# Patient Record
Sex: Female | Born: 1940
Health system: Southern US, Community
[De-identification: ages and names within clinical notes are randomized; demographics above are authoritative.]

## PROBLEM LIST (undated history)

## (undated) DIAGNOSIS — J449 Chronic obstructive pulmonary disease, unspecified: Secondary | ICD-10-CM

## (undated) DIAGNOSIS — Z860100 Personal history of colon polyps, unspecified: Secondary | ICD-10-CM

## (undated) DIAGNOSIS — I341 Nonrheumatic mitral (valve) prolapse: Secondary | ICD-10-CM

## (undated) DIAGNOSIS — E079 Disorder of thyroid, unspecified: Secondary | ICD-10-CM

## (undated) DIAGNOSIS — F419 Anxiety disorder, unspecified: Secondary | ICD-10-CM

## (undated) DIAGNOSIS — R011 Cardiac murmur, unspecified: Secondary | ICD-10-CM

## (undated) DIAGNOSIS — IMO0001 Reserved for inherently not codable concepts without codable children: Secondary | ICD-10-CM

## (undated) DIAGNOSIS — I219 Acute myocardial infarction, unspecified: Secondary | ICD-10-CM

## (undated) DIAGNOSIS — I872 Venous insufficiency (chronic) (peripheral): Secondary | ICD-10-CM

## (undated) DIAGNOSIS — I10 Essential (primary) hypertension: Secondary | ICD-10-CM

## (undated) DIAGNOSIS — I214 Non-ST elevation (NSTEMI) myocardial infarction: Secondary | ICD-10-CM

## (undated) DIAGNOSIS — J189 Pneumonia, unspecified organism: Secondary | ICD-10-CM

## (undated) DIAGNOSIS — M1990 Unspecified osteoarthritis, unspecified site: Secondary | ICD-10-CM

## (undated) DIAGNOSIS — E785 Hyperlipidemia, unspecified: Secondary | ICD-10-CM

## (undated) DIAGNOSIS — Z5189 Encounter for other specified aftercare: Secondary | ICD-10-CM

## (undated) DIAGNOSIS — M4800 Spinal stenosis, site unspecified: Secondary | ICD-10-CM

## (undated) HISTORY — DX: Hyperlipidemia, unspecified: E78.5

## (undated) HISTORY — DX: Anxiety disorder, unspecified: F41.9

## (undated) HISTORY — DX: Essential (primary) hypertension: I10

## (undated) HISTORY — DX: Chronic obstructive pulmonary disease, unspecified: J44.9

## (undated) HISTORY — DX: Venous insufficiency (chronic) (peripheral): I87.2

## (undated) HISTORY — DX: Nonrheumatic mitral (valve) prolapse: I34.1

## (undated) HISTORY — DX: Unspecified osteoarthritis, unspecified site: M19.90

## (undated) HISTORY — DX: Personal history of colon polyps, unspecified: Z86.0100

## (undated) HISTORY — DX: Encounter for other specified aftercare: Z51.89

## (undated) HISTORY — DX: Cardiac murmur, unspecified: R01.1

## (undated) HISTORY — DX: Spinal stenosis, site unspecified: M48.00

## (undated) HISTORY — DX: Disorder of thyroid, unspecified: E07.9

## (undated) HISTORY — DX: Reserved for inherently not codable concepts without codable children: IMO0001

## (undated) HISTORY — DX: Non-ST elevation (NSTEMI) myocardial infarction: I21.4

## (undated) HISTORY — DX: Personal history of colonic polyps: Z86.010

## (undated) HISTORY — DX: Pneumonia, unspecified organism: J18.9

## (undated) HISTORY — PX: COLONOSCOPY: SHX174

## (undated) HISTORY — PX: SQUAMOUS CELL CARCINOMA EXCISION: SHX2433

---
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
## 1974-07-08 HISTORY — PX: TUBAL LIGATION: SHX77

## 1976-09-07 HISTORY — PX: DILATION AND CURETTAGE OF UTERUS: SHX78

## 1976-11-26 HISTORY — PX: ABDOMINAL HYSTERECTOMY: SHX81

## 1978-02-14 HISTORY — PX: OOPHORECTOMY: SHX86

## 1978-02-14 HISTORY — PX: APPENDECTOMY: SHX54

## 1990-01-26 HISTORY — PX: BASAL CELL CARCINOMA EXCISION: SHX1214

## 1998-09-03 ENCOUNTER — Other Ambulatory Visit: Admission: RE | Admit: 1998-09-03 | Discharge: 1998-09-03 | Payer: Self-pay | Admitting: Obstetrics and Gynecology

## 1998-10-08 ENCOUNTER — Ambulatory Visit (HOSPITAL_COMMUNITY): Admission: RE | Admit: 1998-10-08 | Discharge: 1998-10-08 | Payer: Self-pay | Admitting: Obstetrics & Gynecology

## 1999-10-01 ENCOUNTER — Other Ambulatory Visit: Admission: RE | Admit: 1999-10-01 | Discharge: 1999-10-01 | Payer: Self-pay | Admitting: Obstetrics and Gynecology

## 2000-10-06 ENCOUNTER — Other Ambulatory Visit: Admission: RE | Admit: 2000-10-06 | Discharge: 2000-10-06 | Payer: Self-pay | Admitting: Obstetrics and Gynecology

## 2001-10-26 ENCOUNTER — Other Ambulatory Visit: Admission: RE | Admit: 2001-10-26 | Discharge: 2001-10-26 | Payer: Self-pay | Admitting: Obstetrics and Gynecology

## 2001-11-02 ENCOUNTER — Ambulatory Visit (HOSPITAL_COMMUNITY): Admission: RE | Admit: 2001-11-02 | Discharge: 2001-11-02 | Payer: Self-pay | Admitting: Gastroenterology

## 2002-03-13 ENCOUNTER — Emergency Department (HOSPITAL_COMMUNITY): Admission: EM | Admit: 2002-03-13 | Discharge: 2002-03-13 | Payer: Self-pay | Admitting: Emergency Medicine

## 2002-10-28 ENCOUNTER — Other Ambulatory Visit: Admission: RE | Admit: 2002-10-28 | Discharge: 2002-10-28 | Payer: Self-pay | Admitting: Obstetrics and Gynecology

## 2003-11-14 ENCOUNTER — Other Ambulatory Visit: Admission: RE | Admit: 2003-11-14 | Discharge: 2003-11-14 | Payer: Self-pay | Admitting: Obstetrics and Gynecology

## 2004-08-16 ENCOUNTER — Encounter: Admission: RE | Admit: 2004-08-16 | Discharge: 2004-08-16 | Payer: Self-pay | Admitting: Internal Medicine

## 2004-11-07 ENCOUNTER — Encounter: Admission: RE | Admit: 2004-11-07 | Discharge: 2004-11-07 | Payer: Self-pay | Admitting: Internal Medicine

## 2005-01-01 ENCOUNTER — Other Ambulatory Visit: Admission: RE | Admit: 2005-01-01 | Discharge: 2005-01-01 | Payer: Self-pay | Admitting: Obstetrics and Gynecology

## 2005-03-17 ENCOUNTER — Encounter: Admission: RE | Admit: 2005-03-17 | Discharge: 2005-03-17 | Payer: Self-pay | Admitting: Neurosurgery

## 2005-05-23 ENCOUNTER — Encounter
Admission: RE | Admit: 2005-05-23 | Discharge: 2005-05-23 | Payer: Self-pay | Admitting: Physical Medicine and Rehabilitation

## 2006-05-21 ENCOUNTER — Encounter: Admission: RE | Admit: 2006-05-21 | Discharge: 2006-05-21 | Payer: Self-pay | Admitting: Internal Medicine

## 2006-05-29 ENCOUNTER — Encounter: Admission: RE | Admit: 2006-05-29 | Discharge: 2006-05-29 | Payer: Self-pay | Admitting: Internal Medicine

## 2007-05-18 ENCOUNTER — Ambulatory Visit: Payer: Self-pay | Admitting: Cardiovascular Disease

## 2007-05-19 ENCOUNTER — Inpatient Hospital Stay (HOSPITAL_COMMUNITY): Admission: EM | Admit: 2007-05-19 | Discharge: 2007-05-19 | Payer: Self-pay | Admitting: Emergency Medicine

## 2007-05-28 ENCOUNTER — Encounter: Payer: Self-pay | Admitting: Internal Medicine

## 2007-05-28 ENCOUNTER — Ambulatory Visit: Payer: Self-pay

## 2007-06-03 ENCOUNTER — Encounter: Admission: RE | Admit: 2007-06-03 | Discharge: 2007-06-03 | Payer: Self-pay | Admitting: Internal Medicine

## 2007-06-04 ENCOUNTER — Ambulatory Visit: Payer: Self-pay | Admitting: Cardiology

## 2007-06-16 ENCOUNTER — Ambulatory Visit: Payer: Self-pay | Admitting: Cardiovascular Disease

## 2007-06-16 ENCOUNTER — Ambulatory Visit: Payer: Self-pay

## 2007-07-12 ENCOUNTER — Ambulatory Visit: Payer: Self-pay | Admitting: Cardiology

## 2007-10-18 HISTORY — PX: SHOULDER SURGERY: SHX246

## 2008-06-13 ENCOUNTER — Emergency Department (HOSPITAL_COMMUNITY): Admission: EM | Admit: 2008-06-13 | Discharge: 2008-06-13 | Payer: Self-pay | Admitting: Emergency Medicine

## 2008-06-17 ENCOUNTER — Encounter: Admission: RE | Admit: 2008-06-17 | Discharge: 2008-06-17 | Payer: Self-pay | Admitting: Neurosurgery

## 2008-07-04 ENCOUNTER — Encounter: Admission: RE | Admit: 2008-07-04 | Discharge: 2008-07-04 | Payer: Self-pay | Admitting: Neurosurgery

## 2008-07-18 ENCOUNTER — Encounter: Admission: RE | Admit: 2008-07-18 | Discharge: 2008-07-18 | Payer: Self-pay | Admitting: Neurosurgery

## 2008-08-03 ENCOUNTER — Encounter: Admission: RE | Admit: 2008-08-03 | Discharge: 2008-08-03 | Payer: Self-pay | Admitting: Neurosurgery

## 2008-12-04 DIAGNOSIS — M1990 Unspecified osteoarthritis, unspecified site: Secondary | ICD-10-CM | POA: Insufficient documentation

## 2008-12-04 DIAGNOSIS — I10 Essential (primary) hypertension: Secondary | ICD-10-CM

## 2008-12-04 DIAGNOSIS — E785 Hyperlipidemia, unspecified: Secondary | ICD-10-CM | POA: Insufficient documentation

## 2008-12-04 DIAGNOSIS — N182 Chronic kidney disease, stage 2 (mild): Secondary | ICD-10-CM | POA: Insufficient documentation

## 2008-12-04 DIAGNOSIS — E039 Hypothyroidism, unspecified: Secondary | ICD-10-CM | POA: Diagnosis present

## 2008-12-04 DIAGNOSIS — E1121 Type 2 diabetes mellitus with diabetic nephropathy: Secondary | ICD-10-CM | POA: Insufficient documentation

## 2008-12-05 DIAGNOSIS — I872 Venous insufficiency (chronic) (peripheral): Secondary | ICD-10-CM | POA: Insufficient documentation

## 2010-07-23 NOTE — Procedures (Signed)
[facility]                              EXERCISE TREADMILL   NAME:Desiree Leon, Delgadillo                       MRN:          [id number]  DATE:06/16/2007                            DOB:          1940/08/06    Ms. Alverson is a 70 year old white female that is having this stress test  to rule out microvascular angina.  She has had a normal cardiac  catheterization and 2-D echo.   Baseline EKG, normal sinus rhythm.  The patient exercised to stage 2 of  the Bruce protocol.  We did have to hold her in stage 2.  She could not  progress 3, but she ended up walking 6 minutes, 52 seconds.  Test was  stopped due to shortness of breath and fatigue.  She had a baseline  blood pressure of 129/67, went up as high as 178/52, came down to  132/58.  She did achieve 73% of her maximum predicted heart rate.  She  had no ST T-wave changes.  This was a negative exercise tolerance test.  She had poor exercise tolerance.      Jacolyn Reedy, PA-C  Electronically Signed      Noralyn Pick. Eden Emms, MD, [facility]  Electronically Signed   ML/MedQ  DD: 06/16/2007  DT: 06/16/2007  Job #: [id number]

## 2010-07-23 NOTE — Assessment & Plan Note (Signed)
[facility]                            CARDIOLOGY OFFICE NOTE   Desiree Leon, Desiree Leon                       MRN:          [id number]  DATE:07/12/2007                            DOB:          04/13/1940    PRIMARY CARE PHYSICIAN:  Desiree Leon, M.D.   CLINICAL HISTORY:  [name] is 70 years old and returns for followup  management of her chest pain.  She has a history of remote mitral valve  prolapse and was recently hospitalized with chest pain and underwent  catheterization.  She was found to have mild nonobstructive coronary  disease.  She had persistent symptoms of chest pain, and we arranged for  have to have a stress ECG to see if she might have diagnostic ECG  changes consistent with a diagnosis of microvascular angina.  She did  not have any chest pain or ECG changes on her treadmill test.  She says  that she has done somewhat better over the last few weeks.   She is also evaluated by Dr. Jacky Leon for pain under her left breast with  ultrasound of her liver, gallbladder, and pancreas; and according to her  account, these were all negative.  She also had a CT obtained of the  anterior of the chest by Dr. Jacky Leon, which she indicated was normal.   PAST MEDICAL HISTORY SIGNIFICANT FOR:  1. Hyperlipidemia.  2. Spinal stenosis.  3. Hypertension.  4. Borderline diabetes.  5. Remote tobacco use.   CURRENT MEDICATIONS INCLUDE:  1. Synthroid.  2. Lopressor 50 mg one tablet b.i.d.  3. Nicardipine 20 mg b.i.d.  4. Vytorin.  5. Premarin.  6. Aspirin.   PHYSICAL EXAMINATION:  On examination, today, the blood pressure is  141/72 and the pulse is 63 and regular.  There was no venous distention.  The carotid pulses were full without  bruits.  CHEST:  Clear.  CARDIAC:  Rhythm was regular.  I could hear no murmurs or gallops.  I  could hear no click.  ABDOMEN:  Soft with normal bowel sounds.  There is no  hepatosplenomegaly.  Peripheral pulses  were full with no peripheral edema.   IMPRESSION:  1. Chest pain of uncertain etiology, now improved.  2. Minimal nonobstructive disease at cardiac catheterization.  3. History of mitral valve prolapse, but no prolapse documented on      recent echocardiogram.  4. Hypertension.  5. Hyperlipidemia.   RECOMMENDATIONS:  Fortunately [name] is feeling better.  She feels  that her previous medications, Lopressor and nicardipine help with the  chest pain syndrome that she has had over a number of years.  She says  when she stops the medicine her symptoms increase.  She also indicates  that nitroglycerin helps her pain.  I explained to her that we were not  able to identify any specific cause for her pain, and I did not find any  major problem with her heart disease.  Specifically she has no evidence  of mitral valve prolapse, and has minimal nonobstructive disease at  catheterization, and no evidence for microvascular angina.  She does  have a history of hypertension, and there is an indication for treatment  with both the Lopressor and nicardipine; and if nitroglycerin helps, I  think, it is okay for her to continue that.  I explained that  nitroglycerin sometimes can release pains that are noncardiac.  I will  plan to turn her care back over to Dr. Jacky Leon.  If her situation should  and condition change, I will be most happy to see her back again in  followup.     Desiree Elvera Lennox Juanda Chance, MD, [facility]  Electronically Signed    BRB/MedQ  DD: 07/12/2007  DT: 07/12/2007  Job #: [id number]

## 2010-07-23 NOTE — Assessment & Plan Note (Signed)
[facility]                            CARDIOLOGY OFFICE NOTE   Desiree Leon, Desiree Leon                       MRN:          [id number]  DATE:06/04/2007                            DOB:          06-05-1940    PRIMARY CARE PHYSICIAN:  Geoffry Paradise, M.D.   CLINICAL HISTORY:  Desiree Leon is 70 years old and returns for follow-up  evaluation after a recent hospitalization for chest pain.  I had seen  her sometime many years ago and she had a diagnosis of mitral valve  prolapse associated with chest pain.  Recently, she was admitted to the  hospital with more severe chest pain.  Plans were for an outpatient  evaluation but she had recurrent pain so she underwent catheterization  by Dr. Gala Romney and was found to have minimal nonobstructive coronary  disease.  She was discharged home but has had recurrent symptoms, both  left-sided chest pain which is sharp and mid to left-sided chest pain  which is dull, both at rest and with exertion, and exertion-related  shortness of breath.  She has been seen by Dr. Jacky Kindle who arranged for  her to have a chest CT angiogram which according to her account showed  no evidence of pulmonary embolism.  He has also arranged for her to have  ultrasound of her gallbladder and pancreas, which is pending.   Her past medical history is significant for:  1. Mitral valve prolapse.  2. Hyperlipidemia.  3. Spinal stenosis.  4. Hypertension.  5. Borderline diabetes.  6. Remote tobacco use.   Her current medications include:  1. Synthroid.  2. Lopressor 50 mg one-half tablet b.i.d.  3. Nicardipine 20 mg b.i.d.  4. Vytorin 10/20 daily.  5. Premarin.  6. Pantoprazole.  7. Aspirin.   EXAMINATION:  The blood pressure is 134/82 and the pulse 59 and regular.  There was no venous distension.  The carotid pulses were full without  bruits.  CHEST was clear.  Cardiac rhythm was regular.  There were no  murmurs or gallops or clicks.  The  abdomen was soft without  organomegaly.  Peripheral pulses were full and there was no peripheral  edema.   We did an echocardiogram after her discharge from the hospital and this  showed good LV function and there was no evidence of mitral valve  prolapse.  Her septal wall thickness and posterior wall thickness were  borderline at 12 mm and 11 mm.   IMPRESSION:  1. Chest pain and shortness of breath of uncertain etiology.  2. Minimal nonobstructive coronary artery disease at catheterization.  3. History of mitral valve prolapse but no prolapse documented on      recent echocardiogram.  4. Hypertension.  5. Hyperlipidemia.   RECOMMENDATIONS:  I am uncertain regarding the cause of Desiree Leon's  chest pain.  She does have an exertional component and it is possible  she could have microvascular angina.  I think it is hard to attribute  her symptoms to mitral valve prolapse with an echocardiogram that shows  no structural abnormality of the mitral valve and  no evidence of  prolapse.  Will plan to do a stress ECG (without a scan) to see if she  has exercise-induced ST changes which would point more to the diagnosis  of microvascular angina.  I think it is more likely her symptoms are  noncardiac.  If her exercise test is negative, then we will attribute  the pain to noncardiac causes and let Dr. Jacky Kindle follow through with  his current evaluation.     Bruce Elvera Lennox Juanda Chance, MD, [facility]  Electronically Signed    BRB/MedQ  DD: 06/04/2007  DT: 06/05/2007  Job #: [id number]   cc:   Geoffry Paradise, M.D.

## 2010-07-23 NOTE — Discharge Summary (Signed)
NAME:  JONATHAN, KIRKENDOLL                ACCOUNT NO.:  [id number]   MEDICAL RECORD NO.:  [id number]          PATIENT TYPE:  INP   LOCATION:  6533                         FACILITY:  [facility]   PHYSICIAN:  Bevelyn Buckles. Bensimhon, MDDATE OF BIRTH:  08-22-1940   DATE OF ADMISSION:  05/18/2007  DATE OF DISCHARGE:  05/19/2007                               DISCHARGE SUMMARY   This is an addendum to a previously dictated discharge summary,  job  [id number]. Please refer to that dictation for complete details of  admission.   Ms. Tien was initially set up to be discharged early this morning with  plans for outpatient Myoview and echocardiogram.  Unfortunately Ms.  Revuelta had some recurrent chest discomfort and we decided to perform  left heart cardiac catheterization while she remained an inpatient.  Left heart rate catheterization performed this afternoon showed minor  irregularities in the LAD and right coronary artery.  However, there  were no obstructive lesions and EF was 70% without wall motion  abnormalities, mitral valve prolapse, or mitral regurgitation.  We will  plan to discharge Ms. Hird this evening and I have provided with a  prescription for Protonix 40 mg daily #130 with recent three refills.  Her discharge medications will otherwise remain as they were previously  dictated.      Nicolasa Ducking, ANP      Bevelyn Buckles. Bensimhon, MD  Electronically Signed    CB/MEDQ  D:  05/19/2007  T:  05/21/2007  Job:  [id number]   cc:   Geoffry Paradise, M.D.  James L. Malon Kindle., M.D.  Callie Fielding, M.D.

## 2010-07-23 NOTE — Cardiovascular Report (Signed)
NAME:  Desiree Leon, Desiree Leon                ACCOUNT NO.:  [id number]   MEDICAL RECORD NO.:  [id number]          PATIENT TYPE:  OBV   LOCATION:  6533                         FACILITY:  [facility]   PHYSICIAN:  Bevelyn Buckles. Bensimhon, MDDATE OF BIRTH:  [birth year]   DATE OF PROCEDURE:  05/19/2007  DATE OF DISCHARGE:                            CARDIAC CATHETERIZATION   PRIMARY CARE PHYSICIAN:  Geoffry Paradise, M.D.   CARDIOLOGIST:  Dr. Juanda Chance.   PATIENT IDENTIFICATION:  This was and a 66-year with a history of  hypertension and borderline diabetes.  She also has a history of  reported mitral valve prolapse.  She is admitted with chest pain.  She  had multiple cardiac enzymes which were negative. EKG was normal.  She  was getting ready for discharge today and had another episode of chest  pain.  So she is referred for cardiac catheterization.   PROCEDURES PERFORMED:  1. Selective coronary angiography.  2. Left heart cath.  3. Left ventriculogram.  4. StarClose femoral closure.   DESCRIPTION OF PROCEDURE:  The risks and indication of procedure  explained.  Consent was signed and placed in the chart.  A 5-French  arterial sheath was placed in the right femoral artery using modified  Seldinger technique.  Standard catheters including JL-4, JR-4, and  angled pigtail used for procedure. All catheter exchanges were made over  wire.  There were no apparent complications. At the end of the procedure  the patient's right common femoral artery site was closed with a  StarClose closure device. There was good hemostasis.  No apparent  complications.   Central aortic pressure 148/74 with mean of 104.  LV was 165/6 with EDP  of 21.  There is no aortic stenosis.   Left main was normal.   LAD was a long vessel coursing to the apex.  It gave off two moderate-  sized diagonals.  There was some mild luminal irregularities in the mid-  LAD.   Left circumflex gave off a small OM-1 and a large branching ramus  that  was angiographically normal.   Right coronary artery was a large dominant vessel.  Gave off a PDA and a  moderate-sized posterolateral.  There is a 30% stenosis in the proximal  portion and a minor luminal irregularity in the midsection.   Left ventriculogram done in the RAO position showed an EF of 70%.  There  are no regional wall motion abnormalities.  There is no evidence of  significant mitral valve prolapse and no mitral regurgitation.   ASSESSMENT:  1. Minimal nonobstructive coronary artery disease as described above.  2. Normal left ventricular function with elevated left ventricular end-      diastolic pressure is probably indicative of diastolic dysfunction.   PLAN AND DISCUSSION:  Given her results of her catheterization I doubt  her chest pain is cardiac in nature.  I have reassured her about this.  Will start her proton pump inhibitor and will discharge her home today.  If her  pain persists can follow up with an outpatient GI consult.      Bevelyn Buckles.  Bensimhon, MD  Electronically Signed     DRB/MEDQ  D:  05/19/2007  T:  05/20/2007  Job:  [id number]

## 2010-07-23 NOTE — H&P (Signed)
NAME:  LAURELL, COALSON NO.:  [id number]   MEDICAL RECORD NO.:  [id number]          PATIENT TYPE:  EMS   LOCATION:  MAJO                         FACILITY:  [facility]   PHYSICIAN:  Joellyn Rued, PA-C     DATE OF BIRTH:  1940/12/13   DATE OF ADMISSION:  05/18/2007  DATE OF DISCHARGE:                              HISTORY & PHYSICAL   ADMITTING HISTORY:  Ms. Riggle is a 70 year old white female who was  transported via EMS secondary to chest discomfort to [facility]  emergency room.  Ms. Magana describes a several week history of  shortness of breath both at rest and with exertion; however, she has not  had any nocturnal episodes.  She cannot discern an specific pattern to  her shortness of breath and she states that it is not changed  progressively over the last several weeks.  Today, while washing a  [place] breast at around noon, she suddenly developed, underneath her  left breast, a sharp stabbing-like discomfort.  She stated that each  discomfort a second or so with a 10 on a scale of 0-10.  She had  associated shortness of breath.  The episodes finally resolved at around  3 p.m. after being in the emergency room for several hours.  She thinks  that the morphine may have helped.  She has had similar episodes in the  past but not as bad or as long.  She is not sure of the frequency of  these episodes.  She denies any nausea, vomiting or diaphoresis.  She  has other types of chest discomfort, which she describes as a hurting  sensation and, again, she further cannot describe the pattern and is  somewhat vague in her history.  In the emergency room, she states that  she has had some burping, that has also seemed to have helped the  discomfort.   ALLERGIES:  No known drug allergies.   MEDICATIONS PRIOR TO ADMISSION:  1. Cardene 20 mg b.i.d.  2. Synthroid 150 mcg daily.  3. Vytorin 10/20 daily.  4. Premarin daily.  5. Metoprolol 50 mg b.i.d.  6. Alprazolam 0.25  p.r.n.  7. Tylenol p.r.n.  She actually has nitroglycerin at home but this is old.  She  occasionally takes an aspirin.   PAST MEDICAL HISTORY:  1. Colon polyps, last colonoscopy in 2003.  2. History of mitral valve prolapse; however, the patient cannot      recall her last echocardiogram.  Her last catheterization was in      1987.  Records are not available to [place] but, according to the      patient, it was normal.  Her last Myoview was in February, 2005      and, also according to the patient, this was also normal.  3. History of hypothyroidism and goiter, for which she received      radioactive iodine in 1985.  4. History of hyperlipidemia, last check approximately 6 months ago,      records not available to [place].  5. Multiple basal cell cancers that have been removed in  the past.  6. Spinal stenosis, for which she is involved in pain management.  7. Borderline diabetes.  She does not check her sugars at home.  8. Hypertension.  She does not check her blood pressures at home.   PAST SURGICAL HISTORY:  1. Appendectomy.  2. T&A.  3. Ectopic pregnancy in 1976.  4. Partial hysterectomy and then a total hysterectomy in 1978.   SOCIAL HISTORY:  She resides in [place] with her husband and stepson.  She has 3 children, 5 grandchildren, no great-grandchildren.  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  She has not smoked in  2 years.  Prior to that, she smoked at least 1/2 a pack-per-day for 25  years.  She might have an occasional beer every 6 months.  She denies  any drugs, herbal medications.  She does try to stick to an American  Heart Association and ADA diet, 1200 calories. Exercise:  She uses the  treadmill approximately for 15 minutes 2-3 times a week.  She does not  have any limitations with this.   FAMILY HISTORY:  Her mother is alive at age 26.  She has heart problems  but the patient could not be specific in regards to if she has a history  of COPD,  hypertension, thyroid disorder.  Her father died at age 11  after 3 myocardial infarctions, history of COPD, hypertension,  arthritis.  She has 2 sisters.  Both have diabetes.  One has a history  of unspecified heart problems.  The other also has a history of lupus.  She does not have any brothers.   REVIEW OF SYSTEMS:  In addition to the above, there is occasional bloody  nasal mucus when blowing her nose, chronic sinus problems, occasional  headaches, glasses.  Pedal edema, which resolves at night.  Occasional  palpitations with an increased heart beat, although, again, the patient  cannot describe frequency, duration or any specific pattern.  She has a  history of nocturia, stress incontinence, postmenopausal, right hand  numbness, arthralgias in her back and neck, constant diarrhea ,  occasional dysphagia with her pills.  She has also been under a lot of  stress with some family concerns.  Her husband was recently hospitalized  with a myocardial infarction.   PHYSICAL EXAMINATION:  GENERAL:  Well-developed, well-nourished,  pleasant white female in no apparent distress.  Her 3 daughters and her  husband are present.  VITAL SIGNS:  Temperature 98.2, blood pressure 185/84 and is now 132/59  on IV nitroglycerin, pulse 59 and regular, respirations 24.  HEENT:  Unremarkable.  NECK:  Supple without thyromegaly, adenopathy, JVD or carotid bruits.  CHEST:  Symmetrical excursion, clear to auscultation.  HEART:  PMI is not displaced.  Regular rate and rhythm. I do not hear  any murmurs, rubs, clicks or gallops.  Symmetrical and intact.  I did  not appreciate any abdominal or femoral bruits.  SKIN:  Tegument appears to be intact.  ABDOMEN:  Obese, bowel sounds present without organomegaly, masses or  tenderness.  EXTREMITIES:  Negative for clubbing, cyanosis or edema.  NEUROLOGIC:  Skeletal nerves unremarkable.   LABS:  Chest x-ray shows no active disease.  EKG shows sinus bradycardia  with a ventricular rate of 69, normal  intervals, baseline artifact, no old EKG for comparison.  H&H 14.7 and 43.1.  Normal indices.  Platelets 242, WBCs 8.7, sodium  138, potassium 3.9, BUN 10, creatinine 0.76, glucose 92.  Normal LFTs.  PTT 27, PT 12.7, point of care marker negative x2.  Urinalysis unremarkable.   IMPRESSION:  1. Prolonged atypical chest discomfort in initial EKG despite artifact      and 2 point of care markers that have been unremarkable.  2. Hypertension may be related to above.  3. History as listed per past medical history.   DISPOSITION:  Dr. Eden Emms reviewed the patient's history, spoke with and  examined the patient.  He will admit her overnight for observation.  If  she rules out for myocardial infarction, she will need an outpatient  echocardiogram and Myoview and follow up with Dr. Juanda Chance.  Consideration  should also be given to an outpatient blood pressure diary to assure  that her hypertension is treated.  We will check our usual labs.      Joellyn Rued, PA-C     EW/MEDQ  D:  05/18/2007  T:  05/19/2007  Job:  [id number]   cc:   Barbette Reichmann. Juanda Chance, MD, [facility]  Llana Aliment. Malon Kindle., M.D.  Callie Fielding, M.D.

## 2010-07-23 NOTE — Discharge Summary (Signed)
NAME:  Desiree Leon, Desiree Leon                ACCOUNT NO.:  [id number]   MEDICAL RECORD NO.:  [id number]          PATIENT TYPE:  OBV   LOCATION:  6533                         FACILITY:  [facility]   PHYSICIAN:  Bevelyn Buckles. Bensimhon, MDDATE OF BIRTH:  24-Feb-1941   DATE OF ADMISSION:  05/18/2007  DATE OF DISCHARGE:  05/19/2007                         DISCHARGE SUMMARY - REFERRING   BRIEF HISTORY:  Desiree Leon is a 70 year old white female who was  transported via EMS to [facility] emergency room secondary to chest  discomfort.   She describes a several week history of shortness of breath both  associated with rest and exertion.  She denies nocturnal episodes.  She  could not describe a particular pattern, although she states that it is  not changed within the last couple weeks.   Today, while washing the [place] breast around noon, she suddenly  developed sharp stabbing chest pain underneath her left breast.  These  would last seconds at a time but continued until 3:00 p.m.  She feels  that the morphine in the emergency room relieved her discomfort.  She  gave these episodes a 10 on a scale of 0-10.  She felt that her  breathing was worse with these episodes.  She describes similar episodes  in the past but not as bad or lasted as long.  She could not describe  pattern, frequency or specifics.  She states that she did not have any  associated nausea, vomiting or diaphoresis.  She was taken to the  emergency room.  She did have some burping which also made her feel  slightly better.  Nitroglycerin did not seem to have any effect.   PAST MEDICAL HISTORY:  1. Mitral valve prolapse, although she has not sought evaluation with      Dr. Juanda Chance since the mid 80s.  2. Hyperlipidemia.  3. Basal cell cancer.  4. Spinal stenosis.  5. Borderline diabetes.  6. Hypertension.  7. Remote tobacco use.   LABORATORY:  Admission weight is 86.2 kg.  Admission H&H 14.7 and 43.1,  normal indices, platelets 242, WBCs  8.7, PTT 27, PT 12.7, D-dimer 0.22.  Sodium 138, potassium 3.9, BUN 10, creatinine 0.76, glucose 92, normal  LFTs.  Hemoglobin A1c was 6.3.  CK MBs, relative index and troponins  were within normal limits x2.  Fasting lipids showed a total cholesterol  135, triglycerides 186, HDL 27, LDL 71.  TSH was 1.027, free T4 1.37, T3  uptake 30.8.  Urinalysis was unremarkable.  EKG showed sinus  bradycardia, first-degree AV block, normal axis, nonspecific ST-T wave  changes.  Chest X-Ray: No active disease.   HOSPITAL COURSE:  Desiree Leon was admitted to 6500 overnight for  observation.  Her Lopressor was decreased to 25 mg b.i.d. given her  bradycardia.  Overnight, she did not have any further chest discomfort  or shortness of breath and she was ambulating without difficulty.  After  review, Dr. Gala Romney agreed with Dr. Fabio Bering initial plans that the  patient could be discharged home with outpatient follow-up.   DISCHARGE DIAGNOSES:  1. Prolonged atypical chest discomfort.  2. Hypertension.  3. History of mitral valve prolapse without follow-up for      approximately 20 years.  History is noted per past medical history.   DISPOSITION:  The patient is discharged home.   DISCHARGE INSTRUCTIONS:  1. She is asked to maintain low-sodium heart-healthy ADA diet.  2. Activities were not restricted.  3. Wound care is not applicable.   MEDICATIONS:  She was asked to continue her home medications which  include:  1. Nicardipine 20 mg b.i.d.  2. Synthroid 115 mcg daily.  3. Vytorin 10/20 q.h.s.  4. Premarin 0.3 mg daily.  5. Alprazolam 0.25 as needed.  6. Aspirin 81 mg daily.  7. Metoprolol was decreased to 25 mg b.i.d.   FOLLOWUP:  She was asked to follow up with Dr. Jacky Kindle in regards to her  borderline diabetes. We would suggest beginning metformin 500 mg b.i.d.   PLAN:  Prior to discharge, she was also seen by Cardiac Rehab to  reinforce diet and exercise and consider weight loss.  She was  asked to  bring all her medications to all follow-up appointments.  She will have  an echocardiogram on May 28, 2007, at 7:38 followed by a stress  Myoview 8:30.  She was then follow up with Dr. Juanda Chance on June 04, 2007,  at 2:45.   DISCHARGE TIME:  Forty five minutes.      Joellyn Rued, PA-C      Bevelyn Buckles. Bensimhon, MD  Electronically Signed    EW/MEDQ  D:  05/19/2007  T:  05/19/2007  Job:  [id number]   cc:   Geoffry Paradise, M.D.  James L. Malon Kindle., M.D.  Callie Fielding, M.D.  Bruce Elvera Lennox Juanda Chance, MD, [facility]

## 2010-07-26 NOTE — Op Note (Signed)
   NAME:  Desiree Leon, Desiree Leon                          ACCOUNT NO.:  [id number]   MEDICAL RECORD NO.:  [id number]                   PATIENT TYPE:  AMB   LOCATION:  ENDO                                 FACILITY:  [facility]   PHYSICIAN:  James L. Malon Kindle., M.D.          DATE OF BIRTH:  13-Mar-1940   DATE OF PROCEDURE:  11/02/2001  DATE OF DISCHARGE:                                 OPERATIVE REPORT   PROCEDURE PERFORMED:  Colonoscopy.   ENDOSCOPIST:  Llana Aliment. Edwards, M.D.   MEDICATIONS:  Fentanyl 75 mcg, Versed 7 mg IV.   INSTRUMENT USED:  Pediatric Olympus video colonoscope.   INDICATIONS FOR PROCEDURE:  Previous history of colon polyps in the distant  past.   DESCRIPTION OF PROCEDURE:  The procedure had been explained to the patient  and consent obtained.  With the patient in the left lateral decubitus  position, the adult adjustable Olympus video colonoscope was inserted and  advanced under direct visualization.  The prep was quite good.  Using  abdominal pressure, we were able reach the cecum.  The ileocecal valve and  appendiceal orifice were seen.  The scope was withdrawn and the cecum,  ascending colon, hepatic flexure, transverse colon, splenic flexure,  descending and sigmoid colon were seen well.  No polyps or other lesions  were seen.  The scope was withdrawn.   ASSESSMENT:  No colon polyps or other lesions.   PLAN:  Routine follow-up with yearly Hemoccults, suggest repeat colonoscopy  in five years.                                                James L. Malon Kindle., M.D.    Waldron Session  D:  11/02/2001  T:  11/04/2001  Job:  [id number]   cc:   Gerlene Burdock A. Jacky Kindle, M.D.

## 2010-12-02 LAB — URINE CULTURE
Colony Count: NO GROWTH
Culture: NO GROWTH

## 2010-12-02 LAB — URINALYSIS, ROUTINE W REFLEX MICROSCOPIC
Bilirubin Urine: NEGATIVE
Glucose, UA: NEGATIVE
Hgb urine dipstick: NEGATIVE
Ketones, ur: NEGATIVE
Nitrite: NEGATIVE
Protein, ur: NEGATIVE
Specific Gravity, Urine: 1.004 — ABNORMAL LOW
Urobilinogen, UA: 0.2
pH: 7.5

## 2010-12-02 LAB — POCT CARDIAC MARKERS
CKMB, poc: 1.4
CKMB, poc: <1 — ABNORMAL LOW
Myoglobin, poc: 49.2
Myoglobin, poc: 53.5
Operator id: 294521
Operator id: 294521
Troponin i, poc: <0.05
Troponin i, poc: <0.05

## 2010-12-02 LAB — COMPREHENSIVE METABOLIC PANEL
ALT: 24
AST: 29
Albumin: 4
Alkaline Phosphatase: 77
BUN: 10
CO2: 20
Calcium: 9.5
Chloride: 106
Creatinine, Ser: 0.76
GFR calc Af Amer: >60
GFR calc non Af Amer: >60
Glucose, Bld: 92
Potassium: 3.9
Sodium: 138
Total Bilirubin: 0.6
Total Protein: 6.7

## 2010-12-02 LAB — CBC
HCT: 43.1
Hemoglobin: 14.7
MCHC: 34.1
MCV: 91
Platelets: 242
RBC: 4.74
RDW: 13.5
WBC: 8.7

## 2010-12-02 LAB — CK TOTAL AND CKMB (NOT AT ARMC)
CK, MB: 1.9
CK, MB: 2.3
CK, MB: 2.4
Relative Index: 2.4
Relative Index: INVALID
Relative Index: INVALID
Total CK: 101
Total CK: 84
Total CK: 97

## 2010-12-02 LAB — DIFFERENTIAL
Basophils Absolute: 0
Basophils Relative: 0
Eosinophils Absolute: 0.1
Eosinophils Relative: 1
Lymphocytes Relative: 51 — ABNORMAL HIGH
Lymphs Abs: 4.5 — ABNORMAL HIGH
Monocytes Absolute: 0.5
Monocytes Relative: 6
Neutro Abs: 3.7
Neutrophils Relative %: 42 — ABNORMAL LOW

## 2010-12-02 LAB — TSH: TSH: 1.027

## 2010-12-02 LAB — LIPID PANEL
Cholesterol: 135
HDL: 27 — ABNORMAL LOW
LDL Cholesterol: 71
Total CHOL/HDL Ratio: 5
Triglycerides: 186 — ABNORMAL HIGH
VLDL: 37

## 2010-12-02 LAB — PROTIME-INR
INR: 0.9
Prothrombin Time: 12.7

## 2010-12-02 LAB — HEMOGLOBIN A1C
Hgb A1c MFr Bld: 6.3 — ABNORMAL HIGH
Mean Plasma Glucose: 147

## 2010-12-02 LAB — T4, FREE: Free T4: 1.37

## 2010-12-02 LAB — TROPONIN I
Troponin I: 0.01
Troponin I: <0.01
Troponin I: <0.01

## 2010-12-02 LAB — D-DIMER, QUANTITATIVE (NOT AT ARMC): D-Dimer, Quant: 0.22

## 2010-12-02 LAB — T3 UPTAKE: T3 Uptake Ratio: 30.8

## 2010-12-02 LAB — APTT: aPTT: 27

## 2011-10-06 ENCOUNTER — Ambulatory Visit (INDEPENDENT_AMBULATORY_CARE_PROVIDER_SITE_OTHER): Payer: Medicare Other | Admitting: General Surgery

## 2011-10-06 ENCOUNTER — Encounter (INDEPENDENT_AMBULATORY_CARE_PROVIDER_SITE_OTHER): Payer: Self-pay | Admitting: General Surgery

## 2011-10-06 VITALS — BP 152/84 | HR 64 | Temp 98.2°F | Resp 16 | Ht 65.0 in | Wt 188.2 lb

## 2011-10-06 DIAGNOSIS — R220 Localized swelling, mass and lump, head: Secondary | ICD-10-CM

## 2011-10-06 DIAGNOSIS — R221 Localized swelling, mass and lump, neck: Secondary | ICD-10-CM

## 2011-10-06 NOTE — Patient Instructions (Signed)
You had a 2 cm diameter mass on her posterior neck which is probably either a benign cyst or a lipoma.  After consideration of the pros and cons of this, you have  decided to schedule surgery to remove this under local anesthesia

## 2011-10-06 NOTE — Progress Notes (Signed)
Patient ID: Desiree Leon, female   DOB: [birth year], 71 y.o.   MRN: [id number]  Chief Complaint  Patient presents with  . Cyst    new pt- eval cyst on back of neck    HPI [name] is a 71 y.o. female.  She is referred to me by Dr. Geoffry Paradise for evaluation and management of a soft tissue mass of the posterior neck.  This patient states that she has a 4 to five-year history of a slowly enlarging mass on the posterior neck. It is tender to touch. She says that he has gotten a little bit red in the past but has never drained any purulent fluid and she has never been placed on antibiotics for this.  She has had multiple C-spine injections for C-spine disease. She wonders if this is causing the problem.  Past history is significant for hypertension, hyperlipidemia, heart murmur, diabetes, arthritis, anxiety, radioiodine ablation of her thyroid for thyrotoxicosis in the past, abdominal hysterectomy, tubal ligation, and appendectomy.  She is here today with her daughter for evaluation. HPI  Past Medical History  Diagnosis Date  . Mitral valve prolapse   . Thyroid disease     hypothyroidism  . Diabetes mellitus     type II  . Anxiety   . Spinal stenosis   . Venous insufficiency   . Osteoarthritis   . Hypertension   . Hyperlipidemia   . History of colon polyps   . Blood transfusion   . Heart murmur   . Osteoporosis     Past Surgical History  Procedure Date  . Tonsillectomy 1955  . Tubal ligation 07/08/74  . Dilation and curettage of uterus 09/1976  . Appendectomy 02/14/1978  . Oophorectomy 02/14/1978  . Basal cell carcinoma excision 01/26/1990  . Colonoscopy 1993, 2003, 2008, 2012  . Squamous cell carcinoma excision   . Abdominal hysterectomy 11/26/1976  . Shoulder surgery 10/18/2007    basal cell    Family History  Problem Relation Age of Onset  . Heart disease Father   . Cancer Sister     breast  . Cancer Daughter     cervical and bone    Social  History History  Substance Use Topics  . Smoking status: Former Smoker    Quit date: 03/10/2005  . Smokeless tobacco: Never Used  . Alcohol Use: No    No Known Allergies  Current Outpatient Prescriptions  Medication Sig Dispense Refill  . ACCU-CHEK SMARTVIEW test strip       . acetaminophen (TYLENOL) 500 MG tablet Take 500 mg by mouth every 6 (six) hours as needed.      . ALPRAZolam (XANAX) 0.25 MG tablet Take 0.25 mg by mouth at bedtime as needed.      Marland Kitchen aspirin 81 MG chewable tablet Chew 81 mg by mouth daily.      . Calcium Carbonate-Vitamin D (CALTRATE 600+D PO) Take by mouth daily.      Marland Kitchen levothyroxine (SYNTHROID, LEVOTHROID) 150 MCG tablet daily.      . metoprolol tartrate (LOPRESSOR) 25 MG tablet daily.      . Multiple Vitamin (MULTIVITAMIN) capsule Take 1 capsule by mouth daily.      Marland Kitchen niCARdipine (CARDENE) 20 MG capsule daily.      Marland Kitchen PROAIR HFA 108 (90 BASE) MCG/ACT inhaler Ad lib.      Marland Kitchen VYTORIN 10-20 MG per tablet daily.        Review of Systems Review of Systems  Constitutional: Negative  for fever, chills and unexpected weight change.  HENT: Positive for neck pain. Negative for hearing loss, congestion, sore throat, trouble swallowing and voice change.   Eyes: Negative for visual disturbance.  Respiratory: Negative for cough and wheezing.   Cardiovascular: Negative for chest pain, palpitations and leg swelling.  Gastrointestinal: Negative for nausea, vomiting, abdominal pain, diarrhea, constipation, blood in stool, abdominal distention and anal bleeding.  Genitourinary: Negative for hematuria, vaginal bleeding and difficulty urinating.  Musculoskeletal: Negative for arthralgias.  Skin: Negative for rash and wound.  Neurological: Negative for seizures, syncope and headaches.  Hematological: Negative for adenopathy. Does not bruise/bleed easily.  Psychiatric/Behavioral: Negative for confusion.    Blood pressure 152/84, pulse 64, temperature 98.2 F (36.8 C),  temperature source Temporal, resp. rate 16, height 5\' 5"  (1.651 m), weight 188 lb 3.2 oz (85.367 kg).  Physical Exam Physical Exam  Constitutional: She is oriented to person, place, and time. She appears well-developed and well-nourished. No distress.  HENT:  Head: Normocephalic and atraumatic.  Nose: Nose normal.  Mouth/Throat: No oropharyngeal exudate.       2 cm spherical mass of the posterior neck, just off the midline to the left. Port wine stain present. This is consistent with a cyst or lipoma, more likely a cyst. There is no adenopathy.  Eyes: Conjunctivae and EOM are normal. Pupils are equal, round, and reactive to light. Left eye exhibits no discharge. No scleral icterus.  Neck: Neck supple. No JVD present. No tracheal deviation present. No thyromegaly present.  Cardiovascular: Normal rate, regular rhythm, normal heart sounds and intact distal pulses.   No murmur heard. Pulmonary/Chest: Effort normal and breath sounds normal. No respiratory distress. She has no wheezes. She has no rales. She exhibits no tenderness.  Abdominal: Soft. She exhibits no distension. There is no tenderness. There is no rebound and no guarding.  Lymphadenopathy:    She has no cervical adenopathy.  Neurological: She is alert and oriented to person, place, and time. She exhibits normal muscle tone. Coordination normal.  Skin: Skin is warm. No rash noted. She is not diaphoretic. No erythema. No pallor.  Psychiatric: She has a normal mood and affect. Her behavior is normal. Judgment and thought content normal.    Data Reviewed none  Assessment    Soft tissue mass, posterior neck, 2 cm. Suspect benign cyst versus lipoma.  Diabetes  Hypertension  Chronic anxiety  History radioiodine ablation for thyrotoxicosis, now hypothyroid requiring thyroid hormone replacement.    Plan    She would like this area excised electively because it is tender and has enlarged. We will schedule that for her in the  near future.  We will schedule for excision of this soft tissue mass under local anesthesia in the prone position in the near future.  I discussed the indications, details, techniques, and numerous risk of this procedure with the patient her daughter. They understand these issues. Their questions were answered. They agree with this plan.       Angelia Mould. Derrell Lolling, M.D., [facility] General and Minimally invasive Surgery Breast and Colorectal Surgery Office:   [phone] Pager:   [phone]  10/06/2011, 12:11 PM

## 2011-10-17 NOTE — H&P (Signed)
Desiree Leon    MRN: [id number]   Description: 71 year old female  Provider: Ernestene Mention, MD  Department: [facility]       Diagnoses     Neck mass   - Primary    [phone]      Reason for Visit     Cyst    new pt- eval cyst on back of neck        Vitals - Last Recorded     BP Pulse Temp Resp Ht Wt    152/84 64 98.2 F (36.8 C) (Temporal) 16 5\' 5"  (1.651 m) 188 lb 3.2 oz (85.367 kg)     BMI - 31.32 kg/m2                 History and Physical     Ernestene Mention, MD   Patient ID: Desiree Leon, female   DOB: [birth year], 71 y.o.   MRN: [id number]                 HPI TATIYANA FOUCHER is a 71 y.o. female.  She is referred to me by Dr. Geoffry Paradise for evaluation and management of a soft tissue mass of the posterior neck.   This patient states that she has a 4 to five-year history of a slowly enlarging mass on the posterior neck. It is tender to touch. She says that he has gotten a little bit red in the past but has never drained any purulent fluid and she has never been placed on antibiotics for this.   She has had multiple C-spine injections for C-spine disease. She wonders if this is causing the problem.   Past history is significant for hypertension, hyperlipidemia, heart murmur, diabetes, arthritis, anxiety, radioiodine ablation of her thyroid for thyrotoxicosis in the past, abdominal hysterectomy, tubal ligation, and appendectomy.   She is here today with her daughter for evaluation.      Past Medical History   Diagnosis  Date   .  Mitral valve prolapse     .  Thyroid disease         hypothyroidism   .  Diabetes mellitus         type II   .  Anxiety     .  Spinal stenosis     .  Venous insufficiency     .  Osteoarthritis     .  Hypertension     .  Hyperlipidemia     .  History of colon polyps     .  Blood transfusion     .  Heart murmur     .  Osteoporosis         Past Surgical History   Procedure  Date   .  Tonsillectomy   1955   .  Tubal ligation  07/08/74   .  Dilation and curettage of uterus  09/1976   .  Appendectomy  02/14/1978   .  Oophorectomy  02/14/1978   .  Basal cell carcinoma excision  01/26/1990   .  Colonoscopy  1993, 2003, 2008, 2012   .  Squamous cell carcinoma excision     .  Abdominal hysterectomy  11/26/1976   .  Shoulder surgery  10/18/2007       basal cell       Family History   Problem  Relation  Age of Onset   .  Heart disease  Father     .  Cancer  Sister         breast   .  Cancer  Daughter         cervical and bone      Social History History   Substance Use Topics   .  Smoking status:  Former Smoker       Quit date:  03/10/2005   .  Smokeless tobacco:  Never Used   .  Alcohol Use:  No      No Known Allergies    Current Outpatient Prescriptions   Medication  Sig  Dispense  Refill   .  ACCU-CHEK SMARTVIEW test strip           .  acetaminophen (TYLENOL) 500 MG tablet  Take 500 mg by mouth every 6 (six) hours as needed.         .  ALPRAZolam (XANAX) 0.25 MG tablet  Take 0.25 mg by mouth at bedtime as needed.         Marland Kitchen  aspirin 81 MG chewable tablet  Chew 81 mg by mouth daily.         .  Calcium Carbonate-Vitamin D (CALTRATE 600+D PO)  Take by mouth daily.         Marland Kitchen  levothyroxine (SYNTHROID, LEVOTHROID) 150 MCG tablet  daily.         .  metoprolol tartrate (LOPRESSOR) 25 MG tablet  daily.         .  Multiple Vitamin (MULTIVITAMIN) capsule  Take 1 capsule by mouth daily.         Marland Kitchen  niCARdipine (CARDENE) 20 MG capsule  daily.         Marland Kitchen  PROAIR HFA 108 (90 BASE) MCG/ACT inhaler  Ad lib.         Marland Kitchen  VYTORIN 10-20 MG per tablet  daily.            ROS  Constitutional: Negative for fever, chills and unexpected weight change.  HENT: Positive for neck pain. Negative for hearing loss, congestion, sore throat, trouble swallowing and voice change.   Eyes: Negative for visual disturbance.  Respiratory: Negative for cough and wheezing.   Cardiovascular: Negative for chest  pain, palpitations and leg swelling.  Gastrointestinal: Negative for nausea, vomiting, abdominal pain, diarrhea, constipation, blood in stool, abdominal distention and anal bleeding.  Genitourinary: Negative for hematuria, vaginal bleeding and difficulty urinating.  Musculoskeletal: Negative for arthralgias.  Skin: Negative for rash and wound.  Neurological: Negative for seizures, syncope and headaches.  Hematological: Negative for adenopathy. Does not bruise/bleed easily.  Psychiatric/Behavioral: Negative for confusion.    Blood pressure 152/84, pulse 64, temperature 98.2 F (36.8 C), temperature source Temporal, resp. rate 16, height 5\' 5"  (1.651 m), weight 188 lb 3.2 oz (85.367 kg).   Physical Exam   Constitutional: She is oriented to person, place, and time. She appears well-developed and well-nourished. No distress.  HENT:   Head: Normocephalic and atraumatic.   Nose: Nose normal.   Mouth/Throat: No oropharyngeal exudate.       2 cm spherical mass of the posterior neck, just off the midline to the left. Port wine stain present. This is consistent with a cyst or lipoma, more likely a cyst. There is no adenopathy.  Eyes: Conjunctivae and EOM are normal. Pupils are equal, round, and reactive to light. Left eye exhibits no discharge. No scleral icterus.  Neck: Neck supple. No JVD present. No tracheal deviation present. No thyromegaly present.  Cardiovascular: Normal rate,  regular rhythm, normal heart sounds and intact distal pulses.    No murmur heard. Pulmonary/Chest: Effort normal and breath sounds normal. No respiratory distress. She has no wheezes. She has no rales. She exhibits no tenderness.  Abdominal: Soft. She exhibits no distension. There is no tenderness. There is no rebound and no guarding.  Lymphadenopathy:    She has no cervical adenopathy.  Neurological: She is alert and oriented to person, place, and time. She exhibits normal muscle tone. Coordination normal.  Skin:  Skin is warm. No rash noted. She is not diaphoretic. No erythema. No pallor.  Psychiatric: She has a normal mood and affect. Her behavior is normal. Judgment and thought content normal.      Assessment Soft tissue mass, posterior neck, 2 cm. Suspect benign cyst versus lipoma.   Diabetes   Hypertension   Chronic anxiety   History radioiodine ablation for thyrotoxicosis, now hypothyroid requiring thyroid hormone replacement.   Plan She would like this area excised electively because it is tender and has enlarged. We will schedule that for her in the near future.   We will schedule for excision of this soft tissue mass under local anesthesia in the prone position in the near future.   I discussed the indications, details, techniques, and numerous risk of this procedure with the patient her daughter. They understand these issues. Their questions were answered. They agree with this plan.       Angelia Mould. Derrell Lolling, M.D., [facility] General and Minimally invasive Surgery Breast and Colorectal Surgery Office:   [phone] Pager:   [phone]

## 2011-10-20 ENCOUNTER — Ambulatory Visit (HOSPITAL_BASED_OUTPATIENT_CLINIC_OR_DEPARTMENT_OTHER)
Admission: RE | Admit: 2011-10-20 | Discharge: 2011-10-20 | Disposition: A | Discharge status: Home / Self Care | Payer: Medicare Other | Source: Ambulatory Visit | Attending: General Surgery | Admitting: General Surgery

## 2011-10-20 ENCOUNTER — Encounter (HOSPITAL_BASED_OUTPATIENT_CLINIC_OR_DEPARTMENT_OTHER)
Admission: RE | Disposition: A | Discharge status: Home / Self Care | Payer: Self-pay | Source: Ambulatory Visit | Attending: General Surgery

## 2011-10-20 DIAGNOSIS — D1739 Benign lipomatous neoplasm of skin and subcutaneous tissue of other sites: Secondary | ICD-10-CM | POA: Insufficient documentation

## 2011-10-20 HISTORY — PX: MASS EXCISION: SHX2000

## 2011-10-20 LAB — GLUCOSE, CAPILLARY: Glucose-Capillary: 163 mg/dL — ABNORMAL HIGH (ref 70–99)

## 2011-10-20 SURGERY — MINOR EXCISION OF MASS
Anesthesia: LOCAL | Site: Neck | Wound class: Clean

## 2011-10-20 MED ORDER — SODIUM BICARBONATE 4 % IV SOLN
INTRAVENOUS | Status: DC | PRN
  Administered 2011-10-20: 08:00:00 via INTRADERMAL

## 2011-10-20 MED ORDER — CHLORHEXIDINE GLUCONATE 4 % EX LIQD: 1.0000 "application " | Freq: Once | CUTANEOUS | Status: DC

## 2011-10-20 MED ORDER — HYDROCODONE-ACETAMINOPHEN 5-325 MG PO TABS: 1.0000 | ORAL_TABLET | ORAL | Status: AC | PRN

## 2011-10-20 SURGICAL SUPPLY — 40 items
ADH SKN CLS APL DERMABOND .7 (GAUZE/BANDAGES/DRESSINGS) ×1
APL SKNCLS STERI-STRIP NONHPOA (GAUZE/BANDAGES/DRESSINGS)
BALL CTTN LRG ABS STRL LF (GAUZE/BANDAGES/DRESSINGS)
BENZOIN TINCTURE PRP APPL 2/3 (GAUZE/BANDAGES/DRESSINGS) IMPLANT
BLADE SURG 15 STRL LF DISP TIS (BLADE) ×1 IMPLANT
BLADE SURG 15 STRL SS (BLADE) ×2
CLOTH BEACON ORANGE TIMEOUT ST (SAFETY) ×2 IMPLANT
COTTONBALL LRG STERILE PKG (GAUZE/BANDAGES/DRESSINGS) IMPLANT
DERMABOND ADVANCED (GAUZE/BANDAGES/DRESSINGS) ×1
DERMABOND ADVANCED .7 DNX12 (GAUZE/BANDAGES/DRESSINGS) IMPLANT
DRAPE SURG 17X23 STRL (DRAPES) IMPLANT
ELECT REM PT RETURN 9FT ADLT (ELECTROSURGICAL) ×2
ELECTRODE REM PT RTRN 9FT ADLT (ELECTROSURGICAL) IMPLANT
GAUZE SPONGE 4X4 12PLY STRL LF (GAUZE/BANDAGES/DRESSINGS) IMPLANT
GLOVE EUDERMIC 7 POWDERFREE (GLOVE) ×4 IMPLANT
MARKER SKIN DUAL TIP RULER LAB (MISCELLANEOUS) ×1 IMPLANT
NDL HYPO 25X1 1.5 SAFETY (NEEDLE) ×1 IMPLANT
NDL SAFETY ECLIPSE 18X1.5 (NEEDLE) ×1 IMPLANT
NEEDLE HYPO 18GX1.5 SHARP (NEEDLE) ×2
NEEDLE HYPO 25X1 1.5 SAFETY (NEEDLE) ×2 IMPLANT
NS IRRIG 1000ML POUR BTL (IV SOLUTION) ×2 IMPLANT
PENCIL BUTTON HOLSTER BLD 10FT (ELECTRODE) ×1 IMPLANT
STRIP CLOSURE SKIN 1/2X4 (GAUZE/BANDAGES/DRESSINGS) IMPLANT
SUT MNCRL AB 3-0 PS2 18 (SUTURE) IMPLANT
SUT MNCRL AB 4-0 PS2 18 (SUTURE) ×1 IMPLANT
SUT MON AB 4-0 PC3 18 (SUTURE) IMPLANT
SUT SILK 3 0 TIES 17X18 (SUTURE)
SUT SILK 3-0 18XBRD TIE BLK (SUTURE) IMPLANT
SUT VIC AB 3-0 SH 27 (SUTURE) ×2
SUT VIC AB 3-0 SH 27X BRD (SUTURE) IMPLANT
SUT VIC AB 4-0 P-3 18XBRD (SUTURE) IMPLANT
SUT VIC AB 4-0 P3 18 (SUTURE)
SUT VIC AB 5-0 P-3 18X BRD (SUTURE) IMPLANT
SUT VIC AB 5-0 P3 18 (SUTURE)
SUT VICRYL 3-0 CR8 SH (SUTURE) IMPLANT
SUT VICRYL AB 3 0 TIES (SUTURE) IMPLANT
SWABSTICK POVIDONE IODINE SNGL (MISCELLANEOUS) ×4 IMPLANT
SYR CONTROL 10ML LL (SYRINGE) ×2 IMPLANT
TOWEL OR 17X24 6PK STRL BLUE (TOWEL DISPOSABLE) ×1 IMPLANT
TRAY DSU PREP LF (CUSTOM PROCEDURE TRAY) IMPLANT

## 2011-10-20 NOTE — OR Nursing (Signed)
Patient appeared shaky on discharge.  Dr. Derrell Lolling notified. Accuchek 153.  Patient given instruction to take meds as prescribed for B/P and elevated blood glucose on discharge and follow up with primary care physician.  Discharged home with daughters.

## 2011-10-20 NOTE — Interval H&P Note (Signed)
History and Physical Interval Note:  10/20/2011 7:09 AM  Desiree Leon  has presented today for surgery, with the diagnosis of neck mass  The goals and various methods of treatment have been discussed with the patient and family. After consideration of risks, benefits and other options for treatment, the patient has consented to  Procedure(s) (LRB): MINOR EXCISION OF MASS (N/A) as a surgical intervention .  The patient's history has been reviewed, patient examined today, no change in status, stable for surgery.  I have reviewed the patient's chart and labs.  Questions were answered to the patient's satisfaction.     Ernestene Mention

## 2011-10-20 NOTE — Op Note (Signed)
Patient Name:           Desiree Leon   Date of Surgery:        10/20/2011  Pre op Diagnosis:      2 cm soft tissue mass posterior neck, suspect lipoma  Post op Diagnosis:    same  Procedure:                 Excision 2 cm mass posterior neck with layered closure  Surgeon:                     Angelia Mould. Derrell Lolling, M.D., FACS  Assistant:                      none  Operative Indications:   Desiree Leon is a 71 y.o. female. She was referred to me by Dr. Geoffry Paradise for evaluation and management of a soft tissue mass of the posterior neck.  This patient states that she has a 4 to 5-year history of a slowly enlarging mass on the posterior neck. It is tender to touch. She says that he has gotten a little bit red in the past but has never drained any purulent fluid and she has never been placed on antibiotics for this. She has had multiple C-spine injections for C-spine disease. She wonders if this is causing the problem.  On exam there is a 2 cm spherical mass of the posterior neck, just off the midline to the left. Port wine stain present. This is consistent with a cyst or lipoma, more likely a cyst. There is no adenopathy.   Operative Findings:       2 cm mass consistent with lipoma  Procedure in Detail:          The patient was brought to room 8 at [facility] and placed prone with appropriate padding and oxygen supplementation. She was comfortable. The posterior neck was prepped and draped in a sterile fashion. Surgical time out was performed. 1% Xylocaine with epinephrine was used as local infiltration anesthetic. A   2 cm transverse incision was made. Dissection was carried down through the dermis exposing what  appeared to be an encapsulated lipoma which was dissected away from all the underlying tissue. I took the dissection down to the deep fascia and the lipoma appeared to stop there. The lipoma was removed intact. Hemostasis was excellent and achieved with  electrocautery. The subcutaneous  tissue was closed with 3 interrupted sutures of 3-0 Vicryl and the skin closed with a running subcuticular suture of 4-0 Monocryl and Dermabond. Patient tolerated the procedure well. There were no complications. EBL 5 cc. Counts correct.     Angelia Mould. Derrell Lolling, M.D., FACS General and Minimally Invasive Surgery Breast and Colorectal Surgery  10/20/2011 8:03 AM

## 2011-10-21 ENCOUNTER — Encounter (HOSPITAL_BASED_OUTPATIENT_CLINIC_OR_DEPARTMENT_OTHER): Payer: Self-pay | Admitting: General Surgery

## 2011-10-21 NOTE — Progress Notes (Signed)
Quick Note:  Inform patient of Pathology report,. ______ 

## 2011-10-22 ENCOUNTER — Telehealth (INDEPENDENT_AMBULATORY_CARE_PROVIDER_SITE_OTHER): Payer: Self-pay

## 2011-10-22 NOTE — Telephone Encounter (Signed)
Tried to call pt to give benign path report but no answer.

## 2011-10-23 ENCOUNTER — Telehealth (INDEPENDENT_AMBULATORY_CARE_PROVIDER_SITE_OTHER): Payer: Self-pay | Admitting: General Surgery

## 2011-10-23 NOTE — Telephone Encounter (Signed)
Called patient and advised of pathology per Dr. Jacinto Halim request. Patient understood information given.

## 2011-11-06 ENCOUNTER — Encounter (INDEPENDENT_AMBULATORY_CARE_PROVIDER_SITE_OTHER): Payer: Self-pay | Admitting: General Surgery

## 2011-11-06 ENCOUNTER — Ambulatory Visit (INDEPENDENT_AMBULATORY_CARE_PROVIDER_SITE_OTHER): Payer: Medicare Other | Admitting: General Surgery

## 2011-11-06 VITALS — BP 128/66 | HR 70 | Temp 97.3°F | Resp 14 | Ht 64.0 in | Wt 187.2 lb

## 2011-11-06 DIAGNOSIS — D1739 Benign lipomatous neoplasm of skin and subcutaneous tissue of other sites: Secondary | ICD-10-CM

## 2011-11-06 DIAGNOSIS — D170 Benign lipomatous neoplasm of skin and subcutaneous tissue of head, face and neck: Secondary | ICD-10-CM

## 2011-11-06 NOTE — Patient Instructions (Signed)
The incision on your posterior neck appears to be healing without any obvious surgical complication. The final pathology report shows a benign lipoma, and you have been given a copy of the report.  Return to see Dr. Derrell Lolling if further problems arise.

## 2011-11-06 NOTE — Progress Notes (Signed)
Patient ID: Desiree Leon, female   DOB: [birth year], 71 y.o.   MRN: [id number]  History: This patient underwent excision of a soft tissue mass of the posterior neck on August 12. She is healing uneventfully. Final pathology report showed benign lipoma. She was given a copy of that report.  Exam: Patient looks well. No distress. Family member with her. Incision posterior neck below the hairline is healing without any problems or complications.  Assessment: 2 cm lipoma posterior neck, recovering uneventfully following excision  Plan: Wound care and and hair care discussed. Return to see me when necessary.    Angelia Mould. Derrell Lolling, M.D., [facility] General and Minimally invasive Surgery Breast and Colorectal Surgery Office:   [phone] Pager:   [phone]

## 2012-02-12 ENCOUNTER — Other Ambulatory Visit: Payer: Self-pay | Admitting: Gastroenterology

## 2012-02-12 DIAGNOSIS — R1310 Dysphagia, unspecified: Secondary | ICD-10-CM

## 2012-02-26 ENCOUNTER — Ambulatory Visit
Admission: RE | Admit: 2012-02-26 | Discharge: 2012-02-26 | Disposition: A | Discharge status: Home / Self Care | Payer: Medicare Other | Source: Ambulatory Visit | Attending: Gastroenterology | Admitting: Gastroenterology

## 2012-02-26 DIAGNOSIS — R1310 Dysphagia, unspecified: Secondary | ICD-10-CM

## 2013-06-09 ENCOUNTER — Other Ambulatory Visit (HOSPITAL_COMMUNITY): Payer: Self-pay

## 2013-06-09 DIAGNOSIS — J209 Acute bronchitis, unspecified: Secondary | ICD-10-CM

## 2013-06-09 LAB — PULMONARY FUNCTION TEST
DL/VA % pred: 121 %
DL/VA: 5.83 ml/min/mmHg/L
DLCO cor % pred: 91 %
DLCO cor: 22.12 ml/min/mmHg
DLCO unc % pred: 91 %
DLCO unc: 22.12 ml/min/mmHg
FEF 25-75 Post: 3.96 L/sec
FEF 25-75 Pre: 2.53 L/sec
FEF2575-%Change-Post: 56 %
FEF2575-%Pred-Post: 228 %
FEF2575-%Pred-Pre: 146 %
FEV1-%Change-Post: 15 %
FEV1-%Pred-Post: 103 %
FEV1-%Pred-Pre: 89 %
FEV1-Post: 2.23 L
FEV1-Pre: 1.93 L
FEV1FVC-%Change-Post: 1 %
FEV1FVC-%Pred-Pre: 112 %
FEV6-%Change-Post: 13 %
FEV6-%Pred-Post: 95 %
FEV6-%Pred-Pre: 83 %
FEV6-Post: 2.6 L
FEV6-Pre: 2.29 L
FEV6FVC-%Pred-Post: 105 %
FEV6FVC-%Pred-Pre: 105 %
FVC-%Change-Post: 12 %
FVC-%Pred-Post: 90 %
FVC-%Pred-Pre: 80 %
FVC-Post: 2.6 L
FVC-Pre: 2.3 L
Post FEV1/FVC ratio: 86 %
Post FEV6/FVC ratio: 100 %
Pre FEV1/FVC ratio: 84 %
Pre FEV6/FVC Ratio: 100 %
RV % pred: 90 %
RV: 2.05 L
TLC % pred: 84 %
TLC: 4.25 L

## 2013-06-15 ENCOUNTER — Ambulatory Visit (HOSPITAL_COMMUNITY)
Admission: RE | Admit: 2013-06-15 | Discharge: 2013-06-15 | Disposition: A | Discharge status: Home / Self Care | Payer: Medicare Other | Source: Ambulatory Visit | Attending: Internal Medicine | Admitting: Internal Medicine

## 2013-06-15 DIAGNOSIS — J209 Acute bronchitis, unspecified: Secondary | ICD-10-CM | POA: Insufficient documentation

## 2013-06-15 MED ORDER — ALBUTEROL SULFATE (2.5 MG/3ML) 0.083% IN NEBU
2.5000 mg | INHALATION_SOLUTION | Freq: Once | RESPIRATORY_TRACT | Status: AC
  Administered 2013-06-15: 2.5 mg via RESPIRATORY_TRACT

## 2013-06-18 ENCOUNTER — Encounter (HOSPITAL_COMMUNITY): Payer: Self-pay | Admitting: Emergency Medicine

## 2013-06-18 ENCOUNTER — Emergency Department (HOSPITAL_COMMUNITY): Payer: Medicare Other

## 2013-06-18 ENCOUNTER — Emergency Department (HOSPITAL_COMMUNITY)
Admission: EM | Admit: 2013-06-18 | Discharge: 2013-06-18 | Disposition: A | Discharge status: Home / Self Care | Payer: Medicare Other | Attending: Emergency Medicine | Admitting: Emergency Medicine

## 2013-06-18 ENCOUNTER — Emergency Department (HOSPITAL_COMMUNITY)
Admission: EM | Admit: 2013-06-18 | Discharge: 2013-06-18 | Disposition: A | Discharge status: Nursing Home (Includes ICF) | Payer: Medicare Other | Source: Home / Self Care

## 2013-06-18 DIAGNOSIS — I10 Essential (primary) hypertension: Secondary | ICD-10-CM | POA: Insufficient documentation

## 2013-06-18 DIAGNOSIS — E119 Type 2 diabetes mellitus without complications: Secondary | ICD-10-CM | POA: Insufficient documentation

## 2013-06-18 DIAGNOSIS — R51 Headache: Secondary | ICD-10-CM

## 2013-06-18 DIAGNOSIS — E079 Disorder of thyroid, unspecified: Secondary | ICD-10-CM | POA: Insufficient documentation

## 2013-06-18 DIAGNOSIS — I059 Rheumatic mitral valve disease, unspecified: Secondary | ICD-10-CM | POA: Insufficient documentation

## 2013-06-18 DIAGNOSIS — F411 Generalized anxiety disorder: Secondary | ICD-10-CM | POA: Insufficient documentation

## 2013-06-18 DIAGNOSIS — R011 Cardiac murmur, unspecified: Secondary | ICD-10-CM | POA: Insufficient documentation

## 2013-06-18 DIAGNOSIS — M1990 Unspecified osteoarthritis, unspecified site: Secondary | ICD-10-CM | POA: Insufficient documentation

## 2013-06-18 DIAGNOSIS — J189 Pneumonia, unspecified organism: Secondary | ICD-10-CM | POA: Insufficient documentation

## 2013-06-18 DIAGNOSIS — J320 Chronic maxillary sinusitis: Secondary | ICD-10-CM

## 2013-06-18 DIAGNOSIS — Z87891 Personal history of nicotine dependence: Secondary | ICD-10-CM | POA: Insufficient documentation

## 2013-06-18 DIAGNOSIS — Z79899 Other long term (current) drug therapy: Secondary | ICD-10-CM | POA: Insufficient documentation

## 2013-06-18 DIAGNOSIS — Z860100 Personal history of colon polyps, unspecified: Secondary | ICD-10-CM | POA: Insufficient documentation

## 2013-06-18 DIAGNOSIS — R519 Headache, unspecified: Secondary | ICD-10-CM

## 2013-06-18 DIAGNOSIS — M810 Age-related osteoporosis without current pathological fracture: Secondary | ICD-10-CM | POA: Insufficient documentation

## 2013-06-18 DIAGNOSIS — R5381 Other malaise: Secondary | ICD-10-CM

## 2013-06-18 DIAGNOSIS — R509 Fever, unspecified: Secondary | ICD-10-CM

## 2013-06-18 DIAGNOSIS — J181 Lobar pneumonia, unspecified organism: Secondary | ICD-10-CM

## 2013-06-18 DIAGNOSIS — R5383 Other fatigue: Secondary | ICD-10-CM

## 2013-06-18 DIAGNOSIS — Z7982 Long term (current) use of aspirin: Secondary | ICD-10-CM | POA: Insufficient documentation

## 2013-06-18 DIAGNOSIS — Z9089 Acquired absence of other organs: Secondary | ICD-10-CM | POA: Insufficient documentation

## 2013-06-18 LAB — CBC WITH DIFFERENTIAL/PLATELET
Basophils Absolute: 0.1 10*3/uL (ref 0.0–0.1)
Basophils Relative: 0 % (ref 0–1)
Eosinophils Absolute: 0 10*3/uL (ref 0.0–0.7)
Eosinophils Relative: 0 % (ref 0–5)
HCT: 41.4 % (ref 36.0–46.0)
Hemoglobin: 14.1 g/dL (ref 12.0–15.0)
Lymphocytes Relative: 33 % (ref 12–46)
Lymphs Abs: 4.4 10*3/uL — ABNORMAL HIGH (ref 0.7–4.0)
MCH: 30.9 pg (ref 26.0–34.0)
MCHC: 34.1 g/dL (ref 30.0–36.0)
MCV: 90.8 fL (ref 78.0–100.0)
Monocytes Absolute: 0.3 10*3/uL (ref 0.1–1.0)
Monocytes Relative: 2 % — ABNORMAL LOW (ref 3–12)
Neutro Abs: 8.8 10*3/uL — ABNORMAL HIGH (ref 1.7–7.7)
Neutrophils Relative %: 65 % (ref 43–77)
Platelets: 270 10*3/uL (ref 150–400)
RBC: 4.56 MIL/uL (ref 3.87–5.11)
RDW: 13.9 % (ref 11.5–15.5)
WBC: 13.6 10*3/uL — ABNORMAL HIGH (ref 4.0–10.5)

## 2013-06-18 LAB — URINALYSIS, ROUTINE W REFLEX MICROSCOPIC
Bilirubin Urine: NEGATIVE
Glucose, UA: NEGATIVE mg/dL
Hgb urine dipstick: NEGATIVE
Ketones, ur: NEGATIVE mg/dL
Nitrite: NEGATIVE
Protein, ur: NEGATIVE mg/dL
Specific Gravity, Urine: 1.013 (ref 1.005–1.030)
Urobilinogen, UA: 0.2 mg/dL (ref 0.0–1.0)
pH: 7 (ref 5.0–8.0)

## 2013-06-18 LAB — URINE MICROSCOPIC-ADD ON

## 2013-06-18 LAB — BASIC METABOLIC PANEL
BUN: 16 mg/dL (ref 6–23)
CO2: 22 mEq/L (ref 19–32)
Calcium: 9 mg/dL (ref 8.4–10.5)
Chloride: 98 mEq/L (ref 96–112)
Creatinine, Ser: 0.66 mg/dL (ref 0.50–1.10)
GFR calc Af Amer: >90 mL/min (ref >90)
GFR calc non Af Amer: 86 mL/min — ABNORMAL LOW (ref >90)
Glucose, Bld: 201 mg/dL — ABNORMAL HIGH (ref 70–99)
Potassium: 4 mEq/L (ref 3.7–5.3)
Sodium: 135 mEq/L — ABNORMAL LOW (ref 137–147)

## 2013-06-18 MED ORDER — HYDROCODONE-ACETAMINOPHEN 5-325 MG PO TABS: 2.0000 | ORAL_TABLET | ORAL | Status: DC | PRN

## 2013-06-18 MED ORDER — AZITHROMYCIN 250 MG PO TABS: ORAL_TABLET | ORAL | Status: DC

## 2013-06-18 MED ORDER — TRAMADOL HCL 50 MG PO TABS: 50.0000 mg | ORAL_TABLET | Freq: Four times a day (QID) | ORAL | Status: DC | PRN

## 2013-06-18 NOTE — ED Notes (Signed)
Patient returned from X-ray 

## 2013-06-18 NOTE — ED Notes (Signed)
Pt c/o persistent cold sx Reports she was dx 4 weeks ago w/pneumonia and bronchitis Has been treated w/levofloaxin x2 and x1 round of prednisone Sx today include: HA, f/n, fatigue Denies CP, SOB, wheezing Alert w/no signs of acute distress.

## 2013-06-18 NOTE — ED Provider Notes (Signed)
CSN: [id number]     Arrival date & time 06/18/13  1256 History   First MD Initiated Contact with Patient 06/18/13 1446     Chief Complaint  Patient presents with  . URI   (Consider location/radiation/quality/duration/timing/severity/associated sxs/prior Treatment) HPI Comments: 73 year old female is accompanied by her daughter with complaints of severe headache associated with fever, nausea, fatigue and malaise over the past 2 days. Approximately one month ago she was diagnosed with pneumonia and treated with Levaquin. A chest x-ray was repeated approximately 2 weeks later and showed "bronchitis" without pneumonia. Uncertain about the timing the patient states that she just finished the Levaquin and is almost finished with the prednisone.   Past Medical History  Diagnosis Date  . Mitral valve prolapse   . Thyroid disease     hypothyroidism  . Diabetes mellitus     type II  . Anxiety   . Spinal stenosis   . Venous insufficiency   . Osteoarthritis   . Hypertension   . Hyperlipidemia   . History of colon polyps   . Blood transfusion   . Heart murmur   . Osteoporosis    Past Surgical History  Procedure Laterality Date  . Tonsillectomy  1955  . Tubal ligation  07/08/74  . Dilation and curettage of uterus  09/1976  . Appendectomy  02/14/1978  . Oophorectomy  02/14/1978  . Basal cell carcinoma excision  01/26/1990  . Colonoscopy  1993, 2003, 2008, 2012  . Squamous cell carcinoma excision    . Abdominal hysterectomy  11/26/1976  . Shoulder surgery  10/18/2007    basal cell  . Mass excision  10/20/2011    Procedure: MINOR EXCISION OF MASS;  Surgeon: Adin Hector, MD;  Location: [place];  Service: General;  Laterality: N/A;  Excision mass posterior neck   Family History  Problem Relation Age of Onset  . Heart disease Father   . Cancer Sister     breast  . Cancer Daughter     cervical and bone   History  Substance Use Topics  . Smoking status: Former  Smoker    Quit date: 03/10/2005  . Smokeless tobacco: Never Used  . Alcohol Use: No   OB History   Grav Para Term Preterm Abortions TAB SAB Ect Mult Living                 Review of Systems  Constitutional: Positive for fever, activity change, appetite change and fatigue.  HENT: Negative for congestion, ear discharge, nosebleeds, postnasal drip, rhinorrhea, sinus pressure and sore throat.   Respiratory: Negative.  Negative for cough and shortness of breath.   Cardiovascular: Negative for chest pain and palpitations.  Gastrointestinal: Positive for nausea. Negative for vomiting.  Genitourinary: Negative.   Musculoskeletal: Negative for back pain and neck pain.  Skin: Negative for rash.  Neurological: Positive for weakness, light-headedness and headaches. Negative for speech difficulty.  Psychiatric/Behavioral: Negative.     Allergies  Review of patient's allergies indicates no known allergies.  Home Medications   Current Outpatient Rx  Name  Route  Sig  Dispense  Refill  . ALPRAZolam (XANAX) 0.25 MG tablet   Oral   Take 0.25 mg by mouth at bedtime as needed.         . Calcium Carbonate-Vitamin D (CALTRATE 600+D PO)   Oral   Take by mouth daily.         Marland Kitchen levothyroxine (SYNTHROID, LEVOTHROID) 150 MCG tablet  daily.         . niCARdipine (CARDENE) 20 MG capsule      daily.         Marland Kitchen ACCU-CHEK FASTCLIX LANCETS MISC      Daily.         Marland Kitchen ACCU-CHEK SMARTVIEW test strip               . acetaminophen (TYLENOL) 500 MG tablet   Oral   Take 500 mg by mouth every 6 (six) hours as needed.         Marland Kitchen aspirin 81 MG chewable tablet   Oral   Chew 81 mg by mouth daily.         . metoprolol tartrate (LOPRESSOR) 25 MG tablet      daily.         . Multiple Vitamin (MULTIVITAMIN) capsule   Oral   Take 1 capsule by mouth daily.         Marland Kitchen PROAIR HFA 108 (90 BASE) MCG/ACT inhaler      Ad lib.         Marland Kitchen VYTORIN 10-20 MG per tablet       daily.          BP 160/57  Pulse 77  Temp(Src) 99.5 F (37.5 C) (Oral)  Resp 16  SpO2 97% Physical Exam  Nursing note and vitals reviewed. Constitutional: She is oriented to person, place, and time. She appears well-developed and well-nourished. No distress.  Appears mildly ill, no distress.   HENT:  Mouth/Throat: No oropharyngeal exudate.  Bilat TM's nl OP clear, no exudates or erythema.  Eyes: Conjunctivae and EOM are normal.  Neck: Normal range of motion. Neck supple.  Cardiovascular: Normal rate, regular rhythm and normal heart sounds.   Pulmonary/Chest: Effort normal and breath sounds normal. No respiratory distress. She has no wheezes. She has no rales.  Abdominal: Soft. There is no tenderness.  Lymphadenopathy:    She has no cervical adenopathy.  Neurological: She is alert and oriented to person, place, and time. No cranial nerve deficit or sensory deficit. She exhibits normal muscle tone. Gait normal.  Skin: Skin is warm and dry. No rash noted.    ED Course  Procedures (including critical care time) Labs Review Labs Reviewed - No data to display Imaging Review No results found.   MDM   1. Headache   2. Fever   3. Malaise and fatigue   4. T2DM (type 2 diabetes mellitus)     transfer 20 y o f to the ED for evaluation of severe headache, fever, fatigue and malaise.      Janne Napoleon, NP 06/18/13 1525

## 2013-06-18 NOTE — ED Notes (Signed)
Pt sent from urgent care for further eval of headache and fatigue x 2 days. Pt was recently diagnosed and treated for bronchitis and pneumonia with levofloxacin and prednisone.

## 2013-06-18 NOTE — Discharge Instructions (Signed)
Zithromax as prescribed.  Hydrocodone as prescribed as needed for pain.  Return to the emergency department if you develop worsening headache, high fever, difficulty breathing, or other new and concerning symptoms.   Sinusitis Sinusitis is redness, soreness, and swelling (inflammation) of the paranasal sinuses. Paranasal sinuses are air pockets within the bones of your face (beneath the eyes, the middle of the forehead, or above the eyes). In healthy paranasal sinuses, mucus is able to drain out, and air is able to circulate through them by way of your nose. However, when your paranasal sinuses are inflamed, mucus and air can become trapped. This can allow bacteria and other germs to grow and cause infection. Sinusitis can develop quickly and last only a short time (acute) or continue over a long period (chronic). Sinusitis that lasts for more than 12 weeks is considered chronic.  CAUSES  Causes of sinusitis include:  Allergies.  Structural abnormalities, such as displacement of the cartilage that separates your nostrils (deviated septum), which can decrease the air flow through your nose and sinuses and affect sinus drainage.  Functional abnormalities, such as when the small hairs (cilia) that line your sinuses and help remove mucus do not work properly or are not present. SYMPTOMS  Symptoms of acute and chronic sinusitis are the same. The primary symptoms are pain and pressure around the affected sinuses. Other symptoms include:  Upper toothache.  Earache.  Headache.  Bad breath.  Decreased sense of smell and taste.  A cough, which worsens when you are lying flat.  Fatigue.  Fever.  Thick drainage from your nose, which often is green and may contain pus (purulent).  Swelling and warmth over the affected sinuses. DIAGNOSIS  Your caregiver will perform a physical exam. During the exam, your caregiver may:  Look in your nose for signs of abnormal growths in your nostrils  (nasal polyps).  Tap over the affected sinus to check for signs of infection.  View the inside of your sinuses (endoscopy) with a special imaging device with a light attached (endoscope), which is inserted into your sinuses. If your caregiver suspects that you have chronic sinusitis, one or more of the following tests may be recommended:  Allergy tests.  Nasal culture A sample of mucus is taken from your nose and sent to a lab and screened for bacteria.  Nasal cytology A sample of mucus is taken from your nose and examined by your caregiver to determine if your sinusitis is related to an allergy. TREATMENT  Most cases of acute sinusitis are related to a viral infection and will resolve on their own within 10 days. Sometimes medicines are prescribed to help relieve symptoms (pain medicine, decongestants, nasal steroid sprays, or saline sprays).  However, for sinusitis related to a bacterial infection, your caregiver will prescribe antibiotic medicines. These are medicines that will help kill the bacteria causing the infection.  Rarely, sinusitis is caused by a fungal infection. In theses cases, your caregiver will prescribe antifungal medicine. For some cases of chronic sinusitis, surgery is needed. Generally, these are cases in which sinusitis recurs more than 3 times per year, despite other treatments. HOME CARE INSTRUCTIONS   Drink plenty of water. Water helps thin the mucus so your sinuses can drain more easily.  Use a humidifier.  Inhale steam 3 to 4 times a day (for example, sit in the bathroom with the shower running).  Apply a warm, moist washcloth to your face 3 to 4 times a day, or as directed by  your caregiver.  Use saline nasal sprays to help moisten and clean your sinuses.  Take over-the-counter or prescription medicines for pain, discomfort, or fever only as directed by your caregiver. SEEK IMMEDIATE MEDICAL CARE IF:  You have increasing pain or severe headaches.  You  have nausea, vomiting, or drowsiness.  You have swelling around your face.  You have vision problems.  You have a stiff neck.  You have difficulty breathing. MAKE SURE YOU:   Understand these instructions.  Will watch your condition.  Will get help right away if you are not doing well or get worse. Document Released: 02/24/2005 Document Revised: 05/19/2011 Document Reviewed: 03/11/2011 [facility] Patient Information 2014 [place], [place].  Pneumonia, Adult Pneumonia is an infection of the lungs.  CAUSES Pneumonia may be caused by bacteria or a virus. Usually, these infections are caused by breathing infectious particles into the lungs (respiratory tract). SYMPTOMS   Cough.  Fever.  Chest pain.  Increased rate of breathing.  Wheezing.  Mucus production. DIAGNOSIS  If you have the common symptoms of pneumonia, your caregiver will typically confirm the diagnosis with a chest X-ray. The X-ray will show an abnormality in the lung (pulmonary infiltrate) if you have pneumonia. Other tests of your blood, urine, or sputum may be done to find the specific cause of your pneumonia. Your caregiver may also do tests (blood gases or pulse oximetry) to see how well your lungs are working. TREATMENT  Some forms of pneumonia may be spread to other people when you cough or sneeze. You may be asked to wear a mask before and during your exam. Pneumonia that is caused by bacteria is treated with antibiotic medicine. Pneumonia that is caused by the influenza virus may be treated with an antiviral medicine. Most other viral infections must run their course. These infections will not respond to antibiotics.  PREVENTION A pneumococcal shot (vaccine) is available to prevent a common bacterial cause of pneumonia. This is usually suggested for:  People over 38 years old.  Patients on chemotherapy.  People with chronic lung problems, such as bronchitis or emphysema.  People with immune system  problems. If you are over 65 or have a high risk condition, you may receive the pneumococcal vaccine if you have not received it before. In some countries, a routine influenza vaccine is also recommended. This vaccine can help prevent some cases of pneumonia.You may be offered the influenza vaccine as part of your care. If you smoke, it is time to quit. You may receive instructions on how to stop smoking. Your caregiver can provide medicines and counseling to help you quit. HOME CARE INSTRUCTIONS   Cough suppressants may be used if you are losing too much rest. However, coughing protects you by clearing your lungs. You should avoid using cough suppressants if you can.  Your caregiver may have prescribed medicine if he or she thinks your pneumonia is caused by a bacteria or influenza. Finish your medicine even if you start to feel better.  Your caregiver may also prescribe an expectorant. This loosens the mucus to be coughed up.  Only take over-the-counter or prescription medicines for pain, discomfort, or fever as directed by your caregiver.  Do not smoke. Smoking is a common cause of bronchitis and can contribute to pneumonia. If you are a smoker and continue to smoke, your cough may last several weeks after your pneumonia has cleared.  A cold steam vaporizer or humidifier in your room or home may help loosen mucus.  Coughing  is often worse at night. Sleeping in a semi-upright position in a recliner or using a couple pillows under your head will help with this.  Get rest as you feel it is needed. Your body will usually let you know when you need to rest. SEEK IMMEDIATE MEDICAL CARE IF:   Your illness becomes worse. This is especially true if you are elderly or weakened from any other disease.  You cannot control your cough with suppressants and are losing sleep.  You begin coughing up blood.  You develop pain which is getting worse or is uncontrolled with medicines.  You have a  fever.  Any of the symptoms which initially brought you in for treatment are getting worse rather than better.  You develop shortness of breath or chest pain. MAKE SURE YOU:   Understand these instructions.  Will watch your condition.  Will get help right away if you are not doing well or get worse. Document Released: 02/24/2005 Document Revised: 05/19/2011 Document Reviewed: 05/16/2010 [facility] Patient Information 2014 [place], [place].

## 2013-06-18 NOTE — ED Provider Notes (Signed)
CSN: [id number]     Arrival date & time 06/18/13  1539 History   First MD Initiated Contact with Patient 06/18/13 1549     Chief Complaint  Patient presents with  . Headache  . Fatigue     (Consider location/radiation/quality/duration/timing/severity/associated sxs/prior Treatment) HPI Comments: Patient is a 73 year old female with history of diabetes, hypertension. She presents today with complaints of headache, fever, and malaise for the past 2 days. She was recently diagnosed with pneumonia and has had 2 courses of Levaquin. She denies any injury or trauma.  Patient is a 73 y.o. female presenting with headaches. The history is provided by the patient.  Headache Pain location:  Generalized Quality:  Stabbing Radiates to:  Does not radiate Onset quality:  Gradual Duration:  2 days Timing:  Constant Progression:  Worsening Chronicity:  New Context: activity   Relieved by:  Nothing Worsened by:  Nothing tried Ineffective treatments:  None tried   Past Medical History  Diagnosis Date  . Mitral valve prolapse   . Thyroid disease     hypothyroidism  . Diabetes mellitus     type II  . Anxiety   . Spinal stenosis   . Venous insufficiency   . Osteoarthritis   . Hypertension   . Hyperlipidemia   . History of colon polyps   . Blood transfusion   . Heart murmur   . Osteoporosis    Past Surgical History  Procedure Laterality Date  . Tonsillectomy  1955  . Tubal ligation  07/08/74  . Dilation and curettage of uterus  09/1976  . Appendectomy  02/14/1978  . Oophorectomy  02/14/1978  . Basal cell carcinoma excision  01/26/1990  . Colonoscopy  1993, 2003, 2008, 2012  . Squamous cell carcinoma excision    . Abdominal hysterectomy  11/26/1976  . Shoulder surgery  10/18/2007    basal cell  . Mass excision  10/20/2011    Procedure: MINOR EXCISION OF MASS;  Surgeon: Adin Hector, MD;  Location: [place];  Service: General;  Laterality: N/A;  Excision mass  posterior neck   Family History  Problem Relation Age of Onset  . Heart disease Father   . Cancer Sister     breast  . Cancer Daughter     cervical and bone   History  Substance Use Topics  . Smoking status: Former Smoker    Quit date: 03/10/2005  . Smokeless tobacco: Never Used  . Alcohol Use: No   OB History   Grav Para Term Preterm Abortions TAB SAB Ect Mult Living                 Review of Systems  Neurological: Positive for headaches.  All other systems reviewed and are negative.     Allergies  Review of patient's allergies indicates no known allergies.  Home Medications   Current Outpatient Rx  Name  Route  Sig  Dispense  Refill  . ACCU-CHEK FASTCLIX LANCETS MISC      Daily.         Marland Kitchen ACCU-CHEK SMARTVIEW test strip               . acetaminophen (TYLENOL) 500 MG tablet   Oral   Take 500 mg by mouth every 6 (six) hours as needed.         . ALPRAZolam (XANAX) 0.25 MG tablet   Oral   Take 0.25 mg by mouth at bedtime as needed.         Marland Kitchen  aspirin 81 MG chewable tablet   Oral   Chew 81 mg by mouth daily.         . Calcium Carbonate-Vitamin D (CALTRATE 600+D PO)   Oral   Take by mouth daily.         Marland Kitchen levothyroxine (SYNTHROID, LEVOTHROID) 150 MCG tablet      daily.         . metoprolol tartrate (LOPRESSOR) 25 MG tablet      daily.         . Multiple Vitamin (MULTIVITAMIN) capsule   Oral   Take 1 capsule by mouth daily.         Marland Kitchen niCARdipine (CARDENE) 20 MG capsule      daily.         Marland Kitchen PROAIR HFA 108 (90 BASE) MCG/ACT inhaler      Ad lib.         Marland Kitchen VYTORIN 10-20 MG per tablet      daily.          BP 142/56  Pulse 77  Temp(Src) 99.1 F (37.3 C) (Oral)  Resp 18  Ht 5' 4.5" (1.638 m)  Wt 165 lb (74.844 kg)  BMI 27.90 kg/m2  SpO2 98% Physical Exam  Nursing note and vitals reviewed. Constitutional: She is oriented to person, place, and time. She appears well-developed and well-nourished. No distress.   HENT:  Head: Normocephalic and atraumatic.  Mouth/Throat: Oropharynx is clear and moist.  Eyes: EOM are normal. Pupils are equal, round, and reactive to light.  Neck: Normal range of motion. Neck supple.  Cardiovascular: Normal rate and regular rhythm.  Exam reveals no gallop and no friction rub.   No murmur heard. Pulmonary/Chest: Effort normal and breath sounds normal. No respiratory distress. She has no wheezes. She has no rales.  Abdominal: Soft. Bowel sounds are normal. She exhibits no distension. There is no tenderness.  Musculoskeletal: Normal range of motion. She exhibits no edema.  Neurological: She is alert and oriented to person, place, and time. No cranial nerve deficit. She exhibits normal muscle tone. Coordination normal.  Skin: Skin is warm and dry. She is not diaphoretic.    ED Course  Procedures (including critical care time) Labs Review Labs Reviewed  CBC WITH DIFFERENTIAL  BASIC METABOLIC PANEL  URINALYSIS, ROUTINE W REFLEX MICROSCOPIC   Imaging Review No results found.   EKG Interpretation None      MDM   Final diagnoses:  None    Patient is a 73 year old female who presents with headache and fatigue for the past 2 days. He was recently treated for pneumonia approximately one month ago. Workup reveals an elevated white count of 13.6 and laboratory studies are otherwise unremarkable. Her urinalysis is clear, however chest x-ray does reveal mild streaky opacity in the left lower lobe suspicious for possible pneumonia. A CT scan of her head reveals maxillary sinusitis but no other acute intracranial abnormality. I suspect this is the cause of her headache. She will be treated with antibiotics to cover for both pneumonia and sinusitis and will be given pain medication for her discomfort. She understands to return if she develops chest pain, worsening breathing, or other new and concerning symptoms.    Veryl Speak, MD 06/18/13 [phone]

## 2013-06-21 NOTE — ED Provider Notes (Signed)
Medical screening examination/treatment/procedure(s) were performed by a resident physician or non-physician practitioner and as the supervising physician I was immediately available for consultation/collaboration.  Lynne Leader, MD    Gregor Hams, MD 06/21/13 1640

## 2014-01-02 DIAGNOSIS — I519 Heart disease, unspecified: Secondary | ICD-10-CM | POA: Insufficient documentation

## 2014-04-17 ENCOUNTER — Inpatient Hospital Stay (HOSPITAL_COMMUNITY)
Admission: EM | Admit: 2014-04-17 | Discharge: 2014-04-21 | DRG: 250 | Disposition: A | Discharge status: Home / Self Care | Payer: Medicare Other | Attending: Internal Medicine | Admitting: Internal Medicine

## 2014-04-17 ENCOUNTER — Encounter (HOSPITAL_COMMUNITY): Payer: Self-pay | Admitting: Emergency Medicine

## 2014-04-17 ENCOUNTER — Emergency Department (HOSPITAL_COMMUNITY): Payer: Medicare Other

## 2014-04-17 DIAGNOSIS — K219 Gastro-esophageal reflux disease without esophagitis: Secondary | ICD-10-CM | POA: Insufficient documentation

## 2014-04-17 DIAGNOSIS — R079 Chest pain, unspecified: Secondary | ICD-10-CM | POA: Diagnosis not present

## 2014-04-17 DIAGNOSIS — I872 Venous insufficiency (chronic) (peripheral): Secondary | ICD-10-CM | POA: Diagnosis present

## 2014-04-17 DIAGNOSIS — M4800 Spinal stenosis, site unspecified: Secondary | ICD-10-CM | POA: Diagnosis present

## 2014-04-17 DIAGNOSIS — I25119 Atherosclerotic heart disease of native coronary artery with unspecified angina pectoris: Secondary | ICD-10-CM | POA: Diagnosis present

## 2014-04-17 DIAGNOSIS — Z87891 Personal history of nicotine dependence: Secondary | ICD-10-CM

## 2014-04-17 DIAGNOSIS — E119 Type 2 diabetes mellitus without complications: Secondary | ICD-10-CM | POA: Diagnosis present

## 2014-04-17 DIAGNOSIS — J40 Bronchitis, not specified as acute or chronic: Secondary | ICD-10-CM | POA: Diagnosis present

## 2014-04-17 DIAGNOSIS — I214 Non-ST elevation (NSTEMI) myocardial infarction: Secondary | ICD-10-CM | POA: Diagnosis not present

## 2014-04-17 DIAGNOSIS — M810 Age-related osteoporosis without current pathological fracture: Secondary | ICD-10-CM | POA: Diagnosis present

## 2014-04-17 DIAGNOSIS — E785 Hyperlipidemia, unspecified: Secondary | ICD-10-CM | POA: Diagnosis present

## 2014-04-17 DIAGNOSIS — E039 Hypothyroidism, unspecified: Secondary | ICD-10-CM | POA: Diagnosis present

## 2014-04-17 DIAGNOSIS — J189 Pneumonia, unspecified organism: Secondary | ICD-10-CM

## 2014-04-17 DIAGNOSIS — Z86010 Personal history of colonic polyps: Secondary | ICD-10-CM

## 2014-04-17 DIAGNOSIS — K5900 Constipation, unspecified: Secondary | ICD-10-CM | POA: Diagnosis present

## 2014-04-17 DIAGNOSIS — Z90710 Acquired absence of both cervix and uterus: Secondary | ICD-10-CM

## 2014-04-17 DIAGNOSIS — M1990 Unspecified osteoarthritis, unspecified site: Secondary | ICD-10-CM | POA: Diagnosis present

## 2014-04-17 DIAGNOSIS — I341 Nonrheumatic mitral (valve) prolapse: Secondary | ICD-10-CM | POA: Diagnosis present

## 2014-04-17 DIAGNOSIS — I10 Essential (primary) hypertension: Secondary | ICD-10-CM | POA: Diagnosis present

## 2014-04-17 DIAGNOSIS — Z9861 Coronary angioplasty status: Secondary | ICD-10-CM

## 2014-04-17 DIAGNOSIS — Z7982 Long term (current) use of aspirin: Secondary | ICD-10-CM

## 2014-04-17 DIAGNOSIS — F419 Anxiety disorder, unspecified: Secondary | ICD-10-CM | POA: Diagnosis present

## 2014-04-17 DIAGNOSIS — Z9049 Acquired absence of other specified parts of digestive tract: Secondary | ICD-10-CM | POA: Diagnosis present

## 2014-04-17 DIAGNOSIS — Z85828 Personal history of other malignant neoplasm of skin: Secondary | ICD-10-CM

## 2014-04-17 LAB — COMPREHENSIVE METABOLIC PANEL
ALT: 17 U/L (ref 0–35)
AST: 22 U/L (ref 0–37)
Albumin: 3.9 g/dL (ref 3.5–5.2)
Alkaline Phosphatase: 100 U/L (ref 39–117)
Anion gap: 4 — ABNORMAL LOW (ref 5–15)
BUN: 15 mg/dL (ref 6–23)
CO2: 29 mmol/L (ref 19–32)
Calcium: 8.9 mg/dL (ref 8.4–10.5)
Chloride: 107 mmol/L (ref 96–112)
Creatinine, Ser: 0.85 mg/dL (ref 0.50–1.10)
GFR calc Af Amer: 76 mL/min — ABNORMAL LOW (ref >90)
GFR calc non Af Amer: 66 mL/min — ABNORMAL LOW (ref >90)
Glucose, Bld: 108 mg/dL — ABNORMAL HIGH (ref 70–99)
Potassium: 4 mmol/L (ref 3.5–5.1)
Sodium: 140 mmol/L (ref 135–145)
Total Bilirubin: 0.3 mg/dL (ref 0.3–1.2)
Total Protein: 6.3 g/dL (ref 6.0–8.3)

## 2014-04-17 LAB — CBC
HCT: 41.9 % (ref 36.0–46.0)
Hemoglobin: 14.1 g/dL (ref 12.0–15.0)
MCH: 30.3 pg (ref 26.0–34.0)
MCHC: 33.7 g/dL (ref 30.0–36.0)
MCV: 90.1 fL (ref 78.0–100.0)
Platelets: 232 10*3/uL (ref 150–400)
RBC: 4.65 MIL/uL (ref 3.87–5.11)
RDW: 13.5 % (ref 11.5–15.5)
WBC: 12.2 10*3/uL — ABNORMAL HIGH (ref 4.0–10.5)

## 2014-04-17 LAB — I-STAT TROPONIN, ED: Troponin i, poc: 0 ng/mL (ref 0.00–0.08)

## 2014-04-17 LAB — BRAIN NATRIURETIC PEPTIDE: B Natriuretic Peptide: 68 pg/mL (ref 0.0–100.0)

## 2014-04-17 MED ORDER — AZITHROMYCIN 500 MG PO TABS: 500.0000 mg | ORAL_TABLET | Freq: Every day | ORAL | Status: DC

## 2014-04-17 MED ORDER — NITROGLYCERIN 0.4 MG SL SUBL
0.4000 mg | SUBLINGUAL_TABLET | SUBLINGUAL | Status: DC | PRN
  Administered 2014-04-17: 0.4 mg via SUBLINGUAL
  Filled 2014-04-17: qty 1

## 2014-04-17 MED ORDER — ASPIRIN 81 MG PO CHEW
81.0000 mg | CHEWABLE_TABLET | Freq: Once | ORAL | Status: AC
  Administered 2014-04-17: 81 mg via ORAL
  Filled 2014-04-17: qty 1

## 2014-04-17 MED ORDER — NITROGLYCERIN 2 % TD OINT
1.0000 [in_us] | TOPICAL_OINTMENT | Freq: Four times a day (QID) | TRANSDERMAL | Status: DC
  Administered 2014-04-17: 1 [in_us] via TOPICAL
  Filled 2014-04-17: qty 1

## 2014-04-17 MED ORDER — AZITHROMYCIN 250 MG PO TABS: 250.0000 mg | ORAL_TABLET | Freq: Every day | ORAL | Status: DC

## 2014-04-17 MED ORDER — DEXTROSE 5 % IV SOLN
1.0000 g | Freq: Once | INTRAVENOUS | Status: AC
  Administered 2014-04-17: 1 g via INTRAVENOUS
  Filled 2014-04-17: qty 10

## 2014-04-17 NOTE — ED Notes (Signed)
MD Rees at bedside. 

## 2014-04-17 NOTE — ED Notes (Signed)
EKG given to Dr. Rees 

## 2014-04-17 NOTE — ED Provider Notes (Signed)
CSN: [id number]     Arrival date & time 04/17/14  2000 History   First MD Initiated Contact with Patient 04/17/14 2040     Chief Complaint  Patient presents with  . Chest Pain     Patient is a 74 y.o. female presenting with chest pain. The history is provided by the patient. No language interpreter was used.  Chest Pain  Ms. Carano presents for evaluation of chest pain.  She reports waking this morning and feeling poorly and fatigued.  This evening she developed central chest pain, left jaw pain, left shoulder pain.  Pain is described as dull and waxing and waning.  Pain is improved by nitroglycerin but only temporarily.  She took 3 baby aspirin at home.  She took one NTG at home.  Her blood pressure at home was noted to be 184/77 to 190/79.  She has associated SOB and nausea.  She denies any fevers, cough, abdominal pain, vomiting, diarrhea, leg swelling, leg pain.  Sxs are moderate, waxing and waning, worsening.    Past Medical History  Diagnosis Date  . Mitral valve prolapse   . Thyroid disease     hypothyroidism  . Diabetes mellitus     type II  . Anxiety   . Spinal stenosis   . Venous insufficiency   . Osteoarthritis   . Hypertension   . Hyperlipidemia   . History of colon polyps   . Blood transfusion   . Heart murmur   . Osteoporosis    Past Surgical History  Procedure Laterality Date  . Tonsillectomy  1955  . Tubal ligation  07/08/74  . Dilation and curettage of uterus  09/1976  . Appendectomy  02/14/1978  . Oophorectomy  02/14/1978  . Basal cell carcinoma excision  01/26/1990  . Colonoscopy  1993, 2003, 2008, 2012  . Squamous cell carcinoma excision    . Abdominal hysterectomy  11/26/1976  . Shoulder surgery  10/18/2007    basal cell  . Mass excision  10/20/2011    Procedure: MINOR EXCISION OF MASS;  Surgeon: Adin Hector, MD;  Location: [facility];  Service: General;  Laterality: N/A;  Excision mass posterior neck   Family History  Problem  Relation Age of Onset  . Heart disease Father   . Cancer Sister     breast  . Cancer Daughter     cervical and bone   History  Substance Use Topics  . Smoking status: Former Smoker    Quit date: 03/10/2005  . Smokeless tobacco: Never Used  . Alcohol Use: No   OB History    No data available     Review of Systems  Cardiovascular: Positive for chest pain.  All other systems reviewed and are negative.     Allergies  Review of patient's allergies indicates no known allergies.  Home Medications   Prior to Admission medications   Medication Sig Start Date End Date Taking? Authorizing Provider  acetaminophen (TYLENOL) 500 MG tablet Take 500 mg by mouth every 6 (six) hours as needed for fever or headache.     Historical Provider, MD  ALPRAZolam Duanne Moron) 0.25 MG tablet Take 0.25 mg by mouth at bedtime as needed for anxiety.     Historical Provider, MD  aspirin 81 MG chewable tablet Chew 81 mg by mouth daily.    Historical Provider, MD  azithromycin (ZITHROMAX Z-PAK) 250 MG tablet 2 po day one, then 1 daily x 4 days 06/18/13   Veryl Speak, MD  budesonide-formoterol (SYMBICORT) 160-4.5 MCG/ACT inhaler Inhale 2 puffs into the lungs every 12 (twelve) hours.    Historical Provider, MD  Calcium Carbonate-Vitamin D (CALTRATE 600+D PO) Take by mouth daily.    Historical Provider, MD  HYDROcodone-acetaminophen (NORCO) 5-325 MG per tablet Take 2 tablets by mouth every 4 (four) hours as needed. 06/18/13   Veryl Speak, MD  HYDROMET 5-1.5 MG/5ML syrup Take 5 mLs by mouth at bedtime as needed for cough.  06/10/13   Historical Provider, MD  levothyroxine (SYNTHROID, LEVOTHROID) 150 MCG tablet Take 150 mcg by mouth daily before breakfast.  08/25/11   Historical Provider, MD  metoprolol tartrate (LOPRESSOR) 25 MG tablet Take 25 mg by mouth 2 (two) times daily.  09/12/11   Historical Provider, MD  Multiple Vitamin (MULTIVITAMIN) capsule Take 1 capsule by mouth daily.    Historical Provider, MD  niCARdipine  (CARDENE) 20 MG capsule Take 20 mg by mouth 2 (two) times daily.  09/12/11   Historical Provider, MD  traMADol (ULTRAM) 50 MG tablet Take 1 tablet (50 mg total) by mouth every 6 (six) hours as needed. 06/18/13   Veryl Speak, MD  VYTORIN 10-20 MG per tablet Take 1 tablet by mouth daily at 6 PM.  09/18/11   Historical Provider, MD   BP 164/96 mmHg  Pulse 71  Temp(Src) 98.2 F (36.8 C)  Resp 17  Ht 5\' 4"  (1.626 m)  Wt 161 lb (73.029 kg)  BMI 27.62 kg/m2  SpO2 98% Physical Exam  Constitutional: She is oriented to person, place, and time. She appears well-developed and well-nourished.  HENT:  Head: Normocephalic and atraumatic.  Cardiovascular: Normal rate and regular rhythm.   No murmur heard. Pulmonary/Chest: Effort normal and breath sounds normal. No respiratory distress.  Abdominal: Soft. There is no tenderness. There is no rebound and no guarding.  Musculoskeletal: She exhibits no edema or tenderness.  Neurological: She is alert and oriented to person, place, and time.  Skin: Skin is warm and dry.  Psychiatric: She has a normal mood and affect. Her behavior is normal.  Nursing note and vitals reviewed.   ED Course  Procedures (including critical care time) Labs Review Labs Reviewed  CBC - Abnormal; Notable for the following:    WBC 12.2 (*)    All other components within normal limits  COMPREHENSIVE METABOLIC PANEL - Abnormal; Notable for the following:    Glucose, Bld 108 (*)    GFR calc non Af Amer 66 (*)    GFR calc Af Amer 76 (*)    Anion gap 4 (*)    All other components within normal limits  BRAIN NATRIURETIC PEPTIDE  GLUCOSE, CAPILLARY  TROPONIN I  TROPONIN I  TROPONIN I  CBC  CREATININE, SERUM  CBC  TSH  LIPID PANEL  I-STAT TROPOININ, ED    Imaging Review Dg Chest 2 View  04/17/2014   CLINICAL DATA:  Short of breath and chest pain for 1 day.  EXAM: CHEST  2 VIEW  COMPARISON:  [facility] dating back to 05/18/2007.  FINDINGS: Cardiopericardial silhouette is  within normal limits. Mediastinal contours are normal. Aortic arch atherosclerosis is present. There is a diffuse interstitial pattern, suggesting either atypical infection or interstitial pulmonary edema. No pleural effusions are present. No airspace consolidation.  IMPRESSION: Diffuse nonspecific interstitial pattern, typically associated with either interstitial pulmonary edema or atypical pulmonary infection such as mycoplasma.   Electronically Signed   By: Dereck Ligas M.D.   On: 04/17/2014 20:34  EKG Interpretation   Date/Time:  Monday April 17 2014 20:03:57 EST Ventricular Rate:  66 PR Interval:  144 QRS Duration: 96 QT Interval:  404 QTC Calculation: 423 R Axis:   83 Text Interpretation:  Normal sinus rhythm Normal ECG Confirmed by Hazle Coca [phone]) on 04/17/2014 8:44:02 PM      MDM   Final diagnoses:  Chest pain, unspecified chest pain type  CAP (community acquired pneumonia)    Patient here for evaluation of chest pain. Chest x-ray demonstrates pulmonary edema versus atypical pneumonia. BNP is within normal limits, favor pneumonia as cause of SOB, treating for community acquired pneumonia with Rocephin and azithromycin. Patient does have chest pain that has some features that are concerning for cardiac cause. Discussed with Dr. Elias Else with Cardiology regarding patient, recommends admission to medicine for further evaluation given her pneumonia, he will follow in consult. Discussed with on-call physician for [facility], he recommends admission to [facility] given a since admission for cardiac evaluation. Discussed the case with hospitalist on call, will admit for further evaluation.    Quintella Reichert, MD 04/18/14 [phone]

## 2014-04-17 NOTE — ED Notes (Addendum)
Pt reports "not feeling right" all day, admits that around 7pm she began experiencing sharp chest pain to center of chest, admits to shortness of breath- lung sounds clear.  Pt took 1 Nitro at home around 1945 with some relief of pain.  Pt denies N/V.  No acute distress noted in triage.

## 2014-04-18 ENCOUNTER — Encounter (HOSPITAL_COMMUNITY)
Admission: EM | Disposition: A | Discharge status: Home / Self Care | Payer: 59 | Source: Home / Self Care | Attending: Internal Medicine

## 2014-04-18 DIAGNOSIS — F419 Anxiety disorder, unspecified: Secondary | ICD-10-CM | POA: Diagnosis present

## 2014-04-18 DIAGNOSIS — I341 Nonrheumatic mitral (valve) prolapse: Secondary | ICD-10-CM | POA: Diagnosis present

## 2014-04-18 DIAGNOSIS — R079 Chest pain, unspecified: Secondary | ICD-10-CM

## 2014-04-18 DIAGNOSIS — I10 Essential (primary) hypertension: Secondary | ICD-10-CM

## 2014-04-18 DIAGNOSIS — K219 Gastro-esophageal reflux disease without esophagitis: Secondary | ICD-10-CM | POA: Diagnosis present

## 2014-04-18 DIAGNOSIS — Z85828 Personal history of other malignant neoplasm of skin: Secondary | ICD-10-CM | POA: Diagnosis not present

## 2014-04-18 DIAGNOSIS — E119 Type 2 diabetes mellitus without complications: Secondary | ICD-10-CM | POA: Diagnosis present

## 2014-04-18 DIAGNOSIS — E785 Hyperlipidemia, unspecified: Secondary | ICD-10-CM | POA: Diagnosis present

## 2014-04-18 DIAGNOSIS — J40 Bronchitis, not specified as acute or chronic: Secondary | ICD-10-CM | POA: Diagnosis present

## 2014-04-18 DIAGNOSIS — I209 Angina pectoris, unspecified: Secondary | ICD-10-CM

## 2014-04-18 DIAGNOSIS — Z7982 Long term (current) use of aspirin: Secondary | ICD-10-CM | POA: Diagnosis not present

## 2014-04-18 DIAGNOSIS — M810 Age-related osteoporosis without current pathological fracture: Secondary | ICD-10-CM | POA: Diagnosis present

## 2014-04-18 DIAGNOSIS — Z86010 Personal history of colonic polyps: Secondary | ICD-10-CM | POA: Diagnosis not present

## 2014-04-18 DIAGNOSIS — I214 Non-ST elevation (NSTEMI) myocardial infarction: Secondary | ICD-10-CM | POA: Diagnosis present

## 2014-04-18 DIAGNOSIS — I25119 Atherosclerotic heart disease of native coronary artery with unspecified angina pectoris: Secondary | ICD-10-CM | POA: Diagnosis present

## 2014-04-18 DIAGNOSIS — M4800 Spinal stenosis, site unspecified: Secondary | ICD-10-CM | POA: Diagnosis present

## 2014-04-18 DIAGNOSIS — J189 Pneumonia, unspecified organism: Secondary | ICD-10-CM

## 2014-04-18 DIAGNOSIS — Z90710 Acquired absence of both cervix and uterus: Secondary | ICD-10-CM | POA: Diagnosis not present

## 2014-04-18 DIAGNOSIS — E039 Hypothyroidism, unspecified: Secondary | ICD-10-CM | POA: Diagnosis present

## 2014-04-18 DIAGNOSIS — Z87891 Personal history of nicotine dependence: Secondary | ICD-10-CM | POA: Diagnosis not present

## 2014-04-18 DIAGNOSIS — I2510 Atherosclerotic heart disease of native coronary artery without angina pectoris: Secondary | ICD-10-CM | POA: Diagnosis not present

## 2014-04-18 DIAGNOSIS — K5900 Constipation, unspecified: Secondary | ICD-10-CM | POA: Diagnosis present

## 2014-04-18 DIAGNOSIS — M1990 Unspecified osteoarthritis, unspecified site: Secondary | ICD-10-CM | POA: Diagnosis present

## 2014-04-18 DIAGNOSIS — I872 Venous insufficiency (chronic) (peripheral): Secondary | ICD-10-CM | POA: Diagnosis present

## 2014-04-18 DIAGNOSIS — Z9049 Acquired absence of other specified parts of digestive tract: Secondary | ICD-10-CM | POA: Diagnosis present

## 2014-04-18 HISTORY — PX: LEFT HEART CATHETERIZATION WITH CORONARY ANGIOGRAM: SHX5451

## 2014-04-18 LAB — CBC
HCT: 38.4 % (ref 36.0–46.0)
Hemoglobin: 12.8 g/dL (ref 12.0–15.0)
MCH: 29.9 pg (ref 26.0–34.0)
MCHC: 33.3 g/dL (ref 30.0–36.0)
MCV: 89.7 fL (ref 78.0–100.0)
Platelets: 217 10*3/uL (ref 150–400)
RBC: 4.28 MIL/uL (ref 3.87–5.11)
RDW: 13.6 % (ref 11.5–15.5)
WBC: 12.7 10*3/uL — ABNORMAL HIGH (ref 4.0–10.5)

## 2014-04-18 LAB — TROPONIN I
Troponin I: <0.03 ng/mL (ref <0.031)
Troponin I: 0.15 ng/mL — ABNORMAL HIGH (ref <0.031)
Troponin I: <0.03 ng/mL (ref <0.031)

## 2014-04-18 LAB — LIPID PANEL
Cholesterol: 125 mg/dL (ref 0–200)
HDL: 38 mg/dL — ABNORMAL LOW (ref >39)
LDL Cholesterol: 74 mg/dL (ref 0–99)
Total CHOL/HDL Ratio: 3.3 RATIO
Triglycerides: 65 mg/dL (ref <150)
VLDL: 13 mg/dL (ref 0–40)

## 2014-04-18 LAB — GLUCOSE, CAPILLARY
Glucose-Capillary: 105 mg/dL — ABNORMAL HIGH (ref 70–99)
Glucose-Capillary: 91 mg/dL (ref 70–99)
Glucose-Capillary: 96 mg/dL (ref 70–99)
Glucose-Capillary: 119 mg/dL — ABNORMAL HIGH (ref 70–99)

## 2014-04-18 LAB — PROTIME-INR
INR: 1.08 (ref 0.00–1.49)
Prothrombin Time: 14.1 seconds (ref 11.6–15.2)

## 2014-04-18 LAB — POCT ACTIVATED CLOTTING TIME: Activated Clotting Time: 626 seconds

## 2014-04-18 LAB — MRSA PCR SCREENING: MRSA by PCR: NEGATIVE

## 2014-04-18 LAB — HEPARIN LEVEL (UNFRACTIONATED): Heparin Unfractionated: 0.83 IU/mL — ABNORMAL HIGH (ref 0.30–0.70)

## 2014-04-18 LAB — TSH: TSH: 0.334 u[IU]/mL — ABNORMAL LOW (ref 0.350–4.500)

## 2014-04-18 SURGERY — LEFT HEART CATHETERIZATION WITH CORONARY ANGIOGRAM

## 2014-04-18 MED ORDER — HEPARIN BOLUS VIA INFUSION
4000.0000 [IU] | Freq: Once | INTRAVENOUS | Status: AC
  Administered 2014-04-18: 4000 [IU] via INTRAVENOUS
  Filled 2014-04-18: qty 4000

## 2014-04-18 MED ORDER — ONDANSETRON HCL 4 MG/2ML IJ SOLN
4.0000 mg | Freq: Four times a day (QID) | INTRAMUSCULAR | Status: DC | PRN
  Administered 2014-04-18: 4 mg via INTRAVENOUS
  Filled 2014-04-18: qty 2

## 2014-04-18 MED ORDER — SODIUM CHLORIDE 0.9 % IJ SOLN: 3.0000 mL | INTRAMUSCULAR | Status: DC | PRN

## 2014-04-18 MED ORDER — AZITHROMYCIN 500 MG PO TABS
500.0000 mg | ORAL_TABLET | Freq: Every day | ORAL | Status: DC
  Administered 2014-04-18 – 2014-04-19 (×2): 500 mg via ORAL
  Filled 2014-04-18 (×2): qty 1

## 2014-04-18 MED ORDER — GI COCKTAIL ~~LOC~~
30.0000 mL | Freq: Four times a day (QID) | ORAL | Status: DC | PRN
  Filled 2014-04-18: qty 30

## 2014-04-18 MED ORDER — SODIUM CHLORIDE 0.9 % IV SOLN
1.7500 mg/kg/h | INTRAVENOUS | Status: DC
  Filled 2014-04-18: qty 250

## 2014-04-18 MED ORDER — FENTANYL CITRATE 0.05 MG/ML IJ SOLN
INTRAMUSCULAR | Status: AC
  Filled 2014-04-18: qty 2

## 2014-04-18 MED ORDER — MORPHINE SULFATE 2 MG/ML IJ SOLN
2.0000 mg | INTRAMUSCULAR | Status: DC | PRN
  Administered 2014-04-18 – 2014-04-19 (×4): 2 mg via INTRAVENOUS
  Filled 2014-04-18 (×5): qty 1

## 2014-04-18 MED ORDER — CLOPIDOGREL BISULFATE 75 MG PO TABS
75.0000 mg | ORAL_TABLET | Freq: Every day | ORAL | Status: DC
  Administered 2014-04-19 – 2014-04-21 (×3): 75 mg via ORAL
  Filled 2014-04-18 (×4): qty 1

## 2014-04-18 MED ORDER — NICARDIPINE HCL 20 MG PO CAPS
20.0000 mg | ORAL_CAPSULE | Freq: Two times a day (BID) | ORAL | Status: DC
  Administered 2014-04-18 – 2014-04-21 (×8): 20 mg via ORAL
  Filled 2014-04-18 (×10): qty 1

## 2014-04-18 MED ORDER — ENOXAPARIN SODIUM 40 MG/0.4ML ~~LOC~~ SOLN
40.0000 mg | SUBCUTANEOUS | Status: DC
  Filled 2014-04-18: qty 0.4

## 2014-04-18 MED ORDER — MIDAZOLAM HCL 2 MG/2ML IJ SOLN
INTRAMUSCULAR | Status: AC
Start: 2014-04-18 — End: 2014-04-18
  Filled 2014-04-18: qty 2

## 2014-04-18 MED ORDER — METOPROLOL TARTRATE 25 MG PO TABS
25.0000 mg | ORAL_TABLET | Freq: Two times a day (BID) | ORAL | Status: DC
  Administered 2014-04-18 – 2014-04-21 (×7): 25 mg via ORAL
  Filled 2014-04-18 (×10): qty 1

## 2014-04-18 MED ORDER — SODIUM CHLORIDE 0.9 % IV SOLN
INTRAVENOUS | Status: DC
  Administered 2014-04-18: 03:00:00 via INTRAVENOUS

## 2014-04-18 MED ORDER — VERAPAMIL HCL 2.5 MG/ML IV SOLN
INTRAVENOUS | Status: AC
  Filled 2014-04-18: qty 2

## 2014-04-18 MED ORDER — SODIUM CHLORIDE 0.9 % IV SOLN: 1.0000 mL/kg/h | INTRAVENOUS | Status: AC

## 2014-04-18 MED ORDER — SODIUM CHLORIDE 0.9 % IV SOLN: 250.0000 mL | INTRAVENOUS | Status: DC | PRN

## 2014-04-18 MED ORDER — LIDOCAINE HCL (PF) 1 % IJ SOLN
INTRAMUSCULAR | Status: AC
  Filled 2014-04-18: qty 30

## 2014-04-18 MED ORDER — ASPIRIN EC 325 MG PO TBEC
325.0000 mg | DELAYED_RELEASE_TABLET | Freq: Every day | ORAL | Status: DC
  Administered 2014-04-18: 325 mg via ORAL
  Filled 2014-04-18: qty 1

## 2014-04-18 MED ORDER — ASPIRIN 81 MG PO CHEW
81.0000 mg | CHEWABLE_TABLET | Freq: Every day | ORAL | Status: DC
  Administered 2014-04-19 – 2014-04-21 (×3): 81 mg via ORAL
  Filled 2014-04-18 (×3): qty 1

## 2014-04-18 MED ORDER — MIDAZOLAM HCL 2 MG/2ML IJ SOLN
INTRAMUSCULAR | Status: AC
  Filled 2014-04-18: qty 2

## 2014-04-18 MED ORDER — BIVALIRUDIN 250 MG IV SOLR
INTRAVENOUS | Status: AC
  Filled 2014-04-18: qty 250

## 2014-04-18 MED ORDER — NITROGLYCERIN 1 MG/10 ML FOR IR/CATH LAB
INTRA_ARTERIAL | Status: AC
  Filled 2014-04-18: qty 10

## 2014-04-18 MED ORDER — HEPARIN SODIUM (PORCINE) 1000 UNIT/ML IJ SOLN
INTRAMUSCULAR | Status: AC
  Filled 2014-04-18: qty 1

## 2014-04-18 MED ORDER — NITROGLYCERIN 0.4 MG SL SUBL
0.4000 mg | SUBLINGUAL_TABLET | SUBLINGUAL | Status: DC | PRN
  Administered 2014-04-18 – 2014-04-20 (×5): 0.4 mg via SUBLINGUAL
  Filled 2014-04-18 (×4): qty 1

## 2014-04-18 MED ORDER — HEPARIN (PORCINE) IN NACL 2-0.9 UNIT/ML-% IJ SOLN
INTRAMUSCULAR | Status: AC
  Filled 2014-04-18: qty 1500

## 2014-04-18 MED ORDER — ALPRAZOLAM 0.25 MG PO TABS
0.2500 mg | ORAL_TABLET | Freq: Every evening | ORAL | Status: DC | PRN
  Administered 2014-04-20: 0.25 mg via ORAL
  Filled 2014-04-18: qty 1

## 2014-04-18 MED ORDER — CLOPIDOGREL BISULFATE 300 MG PO TABS
ORAL_TABLET | ORAL | Status: AC
  Filled 2014-04-18: qty 1

## 2014-04-18 MED ORDER — ASPIRIN EC 325 MG PO TBEC
325.0000 mg | DELAYED_RELEASE_TABLET | Freq: Every day | ORAL | Status: DC
  Filled 2014-04-18: qty 1

## 2014-04-18 MED ORDER — ACETAMINOPHEN 500 MG PO TABS: 500.0000 mg | ORAL_TABLET | Freq: Four times a day (QID) | ORAL | Status: DC | PRN

## 2014-04-18 MED ORDER — EZETIMIBE-SIMVASTATIN 10-20 MG PO TABS
1.0000 | ORAL_TABLET | Freq: Every day | ORAL | Status: DC
  Administered 2014-04-19 – 2014-04-20 (×2): 1 via ORAL
  Filled 2014-04-18 (×4): qty 1

## 2014-04-18 MED ORDER — SODIUM CHLORIDE 0.9 % IJ SOLN
3.0000 mL | Freq: Two times a day (BID) | INTRAMUSCULAR | Status: DC
  Administered 2014-04-18: 3 mL via INTRAVENOUS

## 2014-04-18 MED ORDER — NITROGLYCERIN IN D5W 200-5 MCG/ML-% IV SOLN
0.0000 ug/min | INTRAVENOUS | Status: DC
  Administered 2014-04-18: 5 ug/min via INTRAVENOUS
  Filled 2014-04-18 (×2): qty 250

## 2014-04-18 MED ORDER — HEPARIN (PORCINE) IN NACL 100-0.45 UNIT/ML-% IJ SOLN
900.0000 [IU]/h | INTRAMUSCULAR | Status: DC
  Administered 2014-04-18: 1100 [IU]/h via INTRAVENOUS
  Filled 2014-04-18 (×2): qty 250

## 2014-04-18 MED ORDER — ASPIRIN 81 MG PO CHEW
81.0000 mg | CHEWABLE_TABLET | ORAL | Status: DC
Start: 2014-04-19 — End: 2014-04-18

## 2014-04-18 MED ORDER — TICAGRELOR 90 MG PO TABS
ORAL_TABLET | ORAL | Status: AC
  Filled 2014-04-18: qty 2

## 2014-04-18 MED ORDER — LEVOTHYROXINE SODIUM 150 MCG PO TABS
150.0000 ug | ORAL_TABLET | Freq: Every day | ORAL | Status: DC
  Administered 2014-04-18 – 2014-04-21 (×4): 150 ug via ORAL
  Filled 2014-04-18 (×6): qty 1

## 2014-04-18 NOTE — CV Procedure (Signed)
Cardiac Catheterization Procedure Note  Name: Desiree Leon MRN: [id number] DOB: 1940/08/07  Procedure: Left Heart Cath, Selective Coronary Angiography, LV angiography, PTCA of the mid LAD with POBA  Indication: 74 yo WF with NSTEMI and refractory pain despite optimal medical therapy.  Procedural Details:  The right wrist was prepped, draped, and anesthetized with 1% lidocaine. Using the modified Seldinger technique, a 6 French slender sheath was introduced into the right radial artery. 3 mg of verapamil was administered through the sheath, weight-based unfractionated heparin was administered intravenously. Standard Judkins catheters were used for selective coronary angiography and left ventriculography. A 3DRC catheter was used to engage the RCA. Catheter exchanges were performed over an exchange length guidewire. The patient did have significant radial spasm that made catheter placement difficult but improved with sedation and IA Verapamil.   PROCEDURAL FINDINGS Hemodynamics: AO 142/64 mean 94 mm Hg LV 145/21 mmHg   Coronary angiography: Coronary dominance: right  Left mainstem: Normal  Left anterior descending (LAD): The LAD is occluded in the mid vessel. The first diagonal is without significant disease.  Left circumflex (LCx): The LCx consists of a single large OM. It has a small branch in the AV groove. It is otherwise normal.   Right coronary artery (RCA): The RCA is a large dominant vessel. There is diffuse disease in the proximal vessel up to 20-30%. The RCA gives collaterals to the LAD.  Left ventriculography: Left ventricular systolic function is normal, LVEF is estimated at 60-65%, there is no significant mitral regurgitation   PCI Note:  Following the diagnostic procedure, the decision was made to proceed with PCI of the LAD.  Weight-based bivalirudin was given for anticoagulation. Brilinta 180 mg was given orally. Once a therapeutic ACT was achieved, a 6 [place] XBLAD  guide catheter was inserted. We initially attempted to cross the occlusion with a prowater and then a Fielder XT wire. The prowater would not cross. The Pickett XT crossed but it was not clear if we were in the true lumen or subintimal. We switched to a long Miracle brothers wire and crossed the lesion. An OTW 1.25 mm balloon was passed across the lesion and contrast injected through the balloon lumen confirmed that we were in the true lumen but it appeared that we were in the second diagonal. The occlusion was dilated with the 1.25 mm balloon restoring flow to the second diagonal. Review of the old cath films demonstrated that the mid to distal LAD was very small in caliber. The Miracle bros wire was exchanged for a prowater wire via the OTW balloon. The Miracle bros wire was then directed down the LAD proper. A 2.0 mm balloon was then used to dilate the LAD proximal and distal to the second diagonal. The second diagonal was also dilated with the 2.0 mm balloon.  At this point there was 50% residual stenosis in the second diagonal with TIMI 3 flow. The LAD past the second diagonal had a segmental dissection with residual 80% stenosis with TIMI 2 flow. The LAD and diagonal were too small for stenting so the procedure was stopped at this point. The patient  tolerated the procedure well. She was pain free at the end of procedure. There were no immediate procedural complications. A TR band was used for radial hemostasis. The patient was transferred to the post catheterization recovery area for further monitoring.  PCI Data: Vessel - LAD/Segment - mid Percent Stenosis (pre)  100% TIMI-flow 0 POBA of the LAD and second  diagonal Percent Stenosis (post) 80%/50% TIMI-flow (post) 2/3  Final Conclusions:   1. Single vessel occlusive CAD 2. Normal LV function 3. Successful POBA of the mid LAD and second diagonal   Recommendations:  Although the final angioplasty result was suboptimal there was restoration of  good flow and the patient was pain free. The LAD is very small in caliber and not suitable for stenting. Hopefully with medical therapy the vessel  will heal well. I would continue DAPT for a year if tolerated with ASA and Plavix.  Peter [place], [place] 04/18/2014, 6:09 PM

## 2014-04-18 NOTE — Progress Notes (Signed)
ANTICOAGULATION CONSULT NOTE - Initial Consult  Pharmacy Consult for Heparin Indication: chest pain/ACS  No Known Allergies  Patient Measurements: Height: 5\' 4"  (162.6 cm) Weight: 161 lb (73.029 kg) IBW/kg (Calculated) : 54.7  Vital Signs: Temp: 98.4 F (36.9 C) (02/09 0429) Temp Source: Oral (02/09 0429) BP: 133/46 mmHg (02/09 0429) Pulse Rate: 57 (02/09 0429)  Labs:  Recent Labs  04/17/14 2010 04/18/14 0408 04/18/14 0600  HGB 14.1  --  12.8  HCT 41.9  --  38.4  PLT 232  --  217  CREATININE 0.85  --   --   TROPONINI  --  <0.03 0.15*    Estimated Creatinine Clearance: 56.8 mL/min (by C-G formula based on Cr of 0.85).   Medical History: Past Medical History  Diagnosis Date  . Mitral valve prolapse   . Thyroid disease     hypothyroidism  . Diabetes mellitus     type II  . Anxiety   . Spinal stenosis   . Venous insufficiency   . Osteoarthritis   . Hypertension   . Hyperlipidemia   . History of colon polyps   . Blood transfusion   . Heart murmur   . Osteoporosis     Assessment: 74 year old female to begin heparin for chest pain.  Renal function is stable.  No anti-coagulation prior to admission.  Goal of Therapy:  Heparin level 0.3-0.7 units/ml Monitor platelets by anticoagulation protocol: Yes   Plan:  Heparin 4000 units iv bolus x 1 Heparin drip at 1100 units / hr Heparin level 6 hours after heparin starts Daily heparin level, CBC  Thank you. Anette Guarneri, PharmD [phone]  04/18/2014,8:34 AM

## 2014-04-18 NOTE — H&P (View-Only) (Signed)
TELEMETRY: Reviewed telemetry pt in NSR: Filed Vitals:   04/18/14 0030 04/18/14 0045 04/18/14 0146 04/18/14 0429  BP: 111/46 131/60 146/77 133/46  Pulse: 53 53 55 57  Temp:   98.5 F (36.9 C) 98.4 F (36.9 C)  TempSrc:   Oral Oral  Resp: 15 17  18   Height:      Weight:      SpO2: 98% 99% 100% 100%   No intake or output data in the 24 hours ending 04/18/14 0907 Filed Weights   04/17/14 2004  Weight: 161 lb (73.029 kg)    Subjective Continues to have active chest pain left precordial area. Left arm and jaw pain resolved. No SOB.  Derrill Memo ON 04/19/2014] aspirin  81 mg Oral Pre-Cath  . aspirin EC  325 mg Oral Daily  . azithromycin  500 mg Oral Daily  . ezetimibe-simvastatin  1 tablet Oral q1800  . heparin  4,000 Units Intravenous Once  . levothyroxine  150 mcg Oral QAC breakfast  . metoprolol tartrate  25 mg Oral BID  . niCARdipine  20 mg Oral BID  . sodium chloride  3 mL Intravenous Q12H   . sodium chloride 75 mL/hr at 04/18/14 0245  . heparin    . nitroGLYCERIN      LABS: Basic Metabolic Panel:  Recent Labs  04/17/14 2010  NA 140  K 4.0  CL 107  CO2 29  GLUCOSE 108*  BUN 15  CREATININE 0.85  CALCIUM 8.9   Liver Function Tests:  Recent Labs  04/17/14 2010  AST 22  ALT 17  ALKPHOS 100  BILITOT 0.3  PROT 6.3  ALBUMIN 3.9   No results for input(s): LIPASE, AMYLASE in the last 72 hours. CBC:  Recent Labs  04/17/14 2010 04/18/14 0600  WBC 12.2* 12.7*  HGB 14.1 12.8  HCT 41.9 38.4  MCV 90.1 89.7  PLT 232 217   Cardiac Enzymes:  Recent Labs  04/18/14 0408 04/18/14 0600  TROPONINI <0.03 0.15*   BNP: No results for input(s): PROBNP in the last 72 hours. D-Dimer: No results for input(s): DDIMER in the last 72 hours. Hemoglobin A1C: No results for input(s): HGBA1C in the last 72 hours. Fasting Lipid Panel:  Recent Labs  04/18/14 0600  CHOL 125  HDL 38*  LDLCALC 74  TRIG 65  CHOLHDL 3.3   Thyroid Function Tests: No results  for input(s): TSH, T4TOTAL, T3FREE, THYROIDAB in the last 72 hours.  Invalid input(s): FREET3   Radiology/Studies:  Dg Chest 2 View  04/17/2014   CLINICAL DATA:  Short of breath and chest pain for 1 day.  EXAM: CHEST  2 VIEW  COMPARISON:  [facility] dating back to 05/18/2007.  FINDINGS: Cardiopericardial silhouette is within normal limits. Mediastinal contours are normal. Aortic arch atherosclerosis is present. There is a diffuse interstitial pattern, suggesting either atypical infection or interstitial pulmonary edema. No pleural effusions are present. No airspace consolidation.  IMPRESSION: Diffuse nonspecific interstitial pattern, typically associated with either interstitial pulmonary edema or atypical pulmonary infection such as mycoplasma.   Electronically Signed   By: Dereck Ligas M.D.   On: 04/17/2014 20:34   Ecg: NSR with normal Ecg  PHYSICAL EXAM General: Well developed, overweight, in no acute distress. Head: Normocephalic, atraumatic, sclera non-icteric, oropharynx is clear Neck: Negative for carotid bruits. JVD not elevated. No adenopathy Lungs: Clear bilaterally to auscultation without wheezes, rales, or rhonchi. Breathing is unlabored. Heart: RRR S1 S2 without murmurs, rubs, or gallops.  Abdomen: Soft,  non-tender, non-distended with normoactive bowel sounds. No hepatomegaly. No rebound/guarding. No obvious abdominal masses. Msk:  Strength and tone appears normal for age. Extremities: No clubbing, cyanosis or edema.  Distal pedal pulses are 2+ and equal bilaterally. Neuro: Alert and oriented X 3. Moves all extremities spontaneously. Psych:  Responds to questions appropriately with a normal affect.  ASSESSMENT AND PLAN: 1. NSTEMI. Still having active chest pain. Will start IV heparin and IV Ntg. Continue beta blocker, ASA, and statin. Morphine for pain relief. Will cancel stress test. Plan LHC with possible PCI later today. Last cath in 2009 showed no obstructive disease. 2.  Hyperlipidemia on Vytorin with good control. 3. DM type 2. 4. HTN controlled.   Present on Admission:  . Chest pain . Mitral valve prolapse . Hypothyroid . Dyslipidemia  Signed, Juri Dinning [place], [place] 04/18/2014 9:07 AM

## 2014-04-18 NOTE — Consult Note (Signed)
CARDIOLOGY CONSULT NOTE  Patient ID: Desiree Leon, MRN: [id number], DOB/AGE: 75/23/1942 75 y.o. Admit date: 04/17/2014 Date of Consult: 04/18/2014  Primary Physician: Geoffery Lyons, MD Primary Cardiologist: last seen by Dr. Olevia Perches  Chief Complaint: chest pain, high blood pressure Reason for Consultation: need for further testing?  HPI: 74 y.o. female w/ PMHx significant for HTN, DM2, reported MVP who presented to [facility] on 04/18/2014 with complaints of chest pain. She reports a long history of chest pain that dates back to at least 2009 when she was followed by Dr. Olevia Perches with [facility]. Workup back then included several stress tests and a cardiac catheterization. Note dated 07/2007 from Dr. Olevia Perches indicates that workup could not determine a cardiac cause for her pain. She reports continued chest pain since last seeing Dr. Olevia Perches though it is rather stable and predictable with onset when she is very stressed or significant exertion like lifting heavy objects or washing wall.  She recalls having multiple stress test over the years but can't recall when her most recent was.  Today, she had onset of neck and shoulder pain while at rest that migrated to her chest. Described as aching pain. Associated with mild nausea and SOB. No diaphoresis. She noted her BP to be in the 180s-190s.  Symptoms continued but improved with a relative's nitroglycerin. Symptoms finally resolved upon admission to [facility] ER the administration of nitro paste and morphine.  Currently is chest pain free.  Denies fever, chills. Quit smoking several years back.   In the ER, was also treated for PNA based upon her abnormal cxray which showed interstitial pattern.  EKG shows sinus, normal T and ST segments.  Past Medical History  Diagnosis Date  . Mitral valve prolapse   . Thyroid disease     hypothyroidism  . Diabetes mellitus     type II  . Anxiety   . Spinal stenosis   . Venous insufficiency   .  Osteoarthritis   . Hypertension   . Hyperlipidemia   . History of colon polyps   . Blood transfusion   . Heart murmur   . Osteoporosis       Surgical History:  Past Surgical History  Procedure Laterality Date  . Tonsillectomy  1955  . Tubal ligation  07/08/74  . Dilation and curettage of uterus  09/1976  . Appendectomy  02/14/1978  . Oophorectomy  02/14/1978  . Basal cell carcinoma excision  01/26/1990  . Colonoscopy  1993, 2003, 2008, 2012  . Squamous cell carcinoma excision    . Abdominal hysterectomy  11/26/1976  . Shoulder surgery  10/18/2007    basal cell  . Mass excision  10/20/2011    Procedure: MINOR EXCISION OF MASS;  Surgeon: Adin Hector, MD;  Location: [facility];  Service: General;  Laterality: N/A;  Excision mass posterior neck     Home Meds: Prior to Admission medications   Medication Sig Start Date End Date Taking? Authorizing Provider  acetaminophen (TYLENOL) 500 MG tablet Take 500 mg by mouth every 6 (six) hours as needed for fever or headache.    Yes Historical Provider, MD  ALPRAZolam Duanne Moron) 0.25 MG tablet Take 0.25 mg by mouth at bedtime as needed for anxiety.    Yes Historical Provider, MD  aspirin 81 MG chewable tablet Chew 81 mg by mouth daily.   Yes Historical Provider, MD  levothyroxine (SYNTHROID, LEVOTHROID) 150 MCG tablet Take 150 mcg by mouth daily before breakfast.  08/25/11  Yes  Historical Provider, MD  metoprolol tartrate (LOPRESSOR) 25 MG tablet Take 25 mg by mouth 2 (two) times daily.  09/12/11  Yes Historical Provider, MD  niCARdipine (CARDENE) 20 MG capsule Take 20 mg by mouth 2 (two) times daily.  09/12/11  Yes Historical Provider, MD  VYTORIN 10-20 MG per tablet Take 1 tablet by mouth daily at 6 PM.  09/18/11  Yes Historical Provider, MD  azithromycin (ZITHROMAX Z-PAK) 250 MG tablet 2 po day one, then 1 daily x 4 days Patient not taking: Reported on 04/17/2014 06/18/13   Veryl Speak, MD  HYDROcodone-acetaminophen (NORCO) 5-325 MG  per tablet Take 2 tablets by mouth every 4 (four) hours as needed. Patient not taking: Reported on 04/17/2014 06/18/13   Veryl Speak, MD  traMADol (ULTRAM) 50 MG tablet Take 1 tablet (50 mg total) by mouth every 6 (six) hours as needed. Patient not taking: Reported on 04/17/2014 06/18/13   Veryl Speak, MD    Inpatient Medications:  . aspirin EC  325 mg Oral Daily  . azithromycin  500 mg Oral Daily  . enoxaparin (LOVENOX) injection  40 mg Subcutaneous Q24H  . ezetimibe-simvastatin  1 tablet Oral q1800  . levothyroxine  150 mcg Oral QAC breakfast  . metoprolol tartrate  25 mg Oral BID  . niCARdipine  20 mg Oral BID   . sodium chloride      Allergies: No Known Allergies  History   Social History  . Marital Status: Married    Spouse Name: N/A    Number of Children: N/A  . Years of Education: N/A   Occupational History  . Not on file.   Social History Main Topics  . Smoking status: Former Smoker    Quit date: 03/10/2005  . Smokeless tobacco: Never Used  . Alcohol Use: No  . Drug Use: No  . Sexual Activity: Not on file   Other Topics Concern  . Not on file   Social History Narrative     Family History  Problem Relation Age of Onset  . Heart disease Father   . Cancer Sister     breast  . Cancer Daughter     cervical and bone     Review of Systems: General: negative for chills, fever, night sweats or weight changes.  Cardiovascular: see HPI Dermatological: negative for rash Respiratory: negative for cough or wheezing Urologic: negative for hematuria Abdominal: negative for nausea, vomiting, diarrhea, bright red blood per rectum, melena, or hematemesis Neurologic: negative for visual changes, syncope, or dizziness All other systems reviewed and are otherwise negative except as noted above.  Labs: No results for input(s): CKTOTAL, CKMB, TROPONINI in the last 72 hours. Lab Results  Component Value Date   WBC 12.2* 04/17/2014   HGB 14.1 04/17/2014   HCT 41.9  04/17/2014   MCV 90.1 04/17/2014   PLT 232 04/17/2014    Recent Labs Lab 04/17/14 2010  NA 140  K 4.0  CL 107  CO2 29  BUN 15  CREATININE 0.85  CALCIUM 8.9  PROT 6.3  BILITOT 0.3  ALKPHOS 100  ALT 17  AST 22  GLUCOSE 108*    Radiology/Studies:  Dg Chest 2 View  04/17/2014   CLINICAL DATA:  Short of breath and chest pain for 1 day.  EXAM: CHEST  2 VIEW  COMPARISON:  [facility] dating back to 05/18/2007.  FINDINGS: Cardiopericardial silhouette is within normal limits. Mediastinal contours are normal. Aortic arch atherosclerosis is present. There is a diffuse interstitial pattern, suggesting  either atypical infection or interstitial pulmonary edema. No pleural effusions are present. No airspace consolidation.  IMPRESSION: Diffuse nonspecific interstitial pattern, typically associated with either interstitial pulmonary edema or atypical pulmonary infection such as mycoplasma.   Electronically Signed   By: Dereck Ligas M.D.   On: 04/17/2014 20:34    EKG: sinus, rather normal  Physical Exam: Blood pressure 146/77, pulse 55, temperature 98.5 F (36.9 C), temperature source Oral, resp. rate 17, height 5\' 4"  (1.626 m), weight 73.029 kg (161 lb), SpO2 100 %. General: Well developed, well nourished, in no acute distress. Head: Normocephalic, atraumatic, sclera non-icteric, no xanthomas, nares are without discharge.  Neck: Supple. Negative for carotid bruits. JVD not elevated. Lungs: Clear bilaterally to auscultation without wheezes, rales, or rhonchi. Breathing is unlabored. Heart: RRR with S1 S2. No murmurs, rubs, or gallops appreciated. Abdomen: Soft, non-tender, non-distended with normoactive bowel sounds. No hepatomegaly. No rebound/guarding. No obvious abdominal masses. Msk:  Strength and tone appear normal for age. Extremities: No clubbing or cyanosis. No edema.  Distal pedal pulses are 2+ and equal bilaterally. Neuro: Alert and oriented X 3. Moves all extremities  spontaneously. Psych:  Responds to questions appropriately with a normal affect.   Problem List 1. Chest pain, typical features for angina 2. Remote cath ~2009, no obstructive disease 3. DM2 4. Prior smoker 5. Hypertension 6. ?Pneumonia vs. Chronic lung disease  Assessment and Plan:  74 y.o. female w/ PMHx significant for HTN, DM2, reported MVP who presented to [facility] on 04/18/2014 with complaints of chest pain. Her chest pain is concerning for cardiac cause given her risk factors (DM2, HTN) and its features (radiation, relief with nitro, assoc with elevated BP, nausea, SOB). Interestingly though, she has had rather stable consistent symptoms now for 7 years post her cath which did not demonstrate obstructive disease (and her pain was felt to be noncardiac). However, in the 7 year time period, she certainly could have developed obstructive disease (or perhaps it is microvascular dz as she is diabetic, hypertensive and female).  Further risk stratification is warranted and an stress myocardial perfusion imaging would be most helpful (this is assuming her troponins and EKG remain negative). Please keep her NPO in anticipation of this stress test.  In regards, to her medications, would recheck fasting lipids and empirically intensify her statin medication to atorvastatin 40 mg qhs. Continue BB, CCB, and aspirin. No need to add heparin at this point unless troponins turn positive or she has recurrent unstable symptoms at rest.  Defer to medicine team workup of possible PNA versus chronic lung disease. Does not appear to be consistent with pulmonary edema given exam and nl BNP.  Thank you for this consult. We will follow up with the stress test. Please call with questions. Signed, Beadie Matsunaga C. MD 04/18/2014, 2:10 AM

## 2014-04-18 NOTE — ED Notes (Signed)
Report attempted 

## 2014-04-18 NOTE — Progress Notes (Signed)
UR Completed.  [phone]  

## 2014-04-18 NOTE — Interval H&P Note (Signed)
History and Physical Interval Note:  04/18/2014 4:00 PM  Desiree Leon  has presented today for surgery, with the diagnosis of cp  The various methods of treatment have been discussed with the patient and family. After consideration of risks, benefits and other options for treatment, the patient has consented to  Procedure(s): LEFT HEART CATHETERIZATION WITH CORONARY ANGIOGRAM (N/A) as a surgical intervention .  The patient's history has been reviewed, patient examined, no change in status, stable for surgery.  I have reviewed the patient's chart and labs.  Questions were answered to the patient's satisfaction.    Cath Lab Visit (complete for each Cath Lab visit)  Clinical Evaluation Leading to the Procedure:   ACS: Yes.    Non-ACS:    Anginal Classification: CCS IV  Anti-ischemic medical therapy: Maximal Therapy (2 or more classes of medications)  Non-Invasive Test Results: No non-invasive testing performed  Prior CABG: No previous CABG       Desiree Leon [facility] 04/18/2014 4:01 PM

## 2014-04-18 NOTE — Progress Notes (Signed)
Patient seen and examined. Admitted after midnight secondary to CP. Patient continue experiencing chest discomfort this morning and troponin has turned to be positive. No diaphoresis and VS otherwise WNL. Please referred to H&P written by Dr. Coralyn Pear for further info/details on admission.  Plan: -will start patient on heparin drip -re-consult cardiology in am as patient will need left cath most likely -continue tx for concerns of atypical PNA with zithromax  -continue treatment of diabetes, HTN and HLD -continue ASA, continue statins , continue B-blocker. Will follow cardiology recommendations    Barton Dubois [phone]

## 2014-04-18 NOTE — Progress Notes (Deleted)
ANTICOAGULATION CONSULT NOTE  Pharmacy Consult for Heparin Indication: chest pain/ACS  No Known Allergies  Patient Measurements: Height: 5\' 4"  (162.6 cm) Weight: 160 lb 15 oz (73 kg) IBW/kg (Calculated) : 54.7  Vital Signs: Temp: 98.6 F (37 C) (02/09 1308) Temp Source: Oral (02/09 1308) BP: 129/57 mmHg (02/09 1308) Pulse Rate: 51 (02/09 1308)  Labs:  Recent Labs  04/17/14 2010 04/18/14 0408 04/18/14 0600 04/18/14 0844 04/18/14 1425  HGB 14.1  --  12.8  --   --   HCT 41.9  --  38.4  --   --   PLT 232  --  217  --   --   LABPROT  --   --   --   --  14.1  INR  --   --   --   --  1.08  HEPARINUNFRC  --   --   --   --  0.83*  CREATININE 0.85  --   --   --   --   TROPONINI  --  <0.03 0.15* <0.03  --     Estimated Creatinine Clearance: 56.8 mL/min (by C-G formula based on Cr of 0.85).   Medical History: Past Medical History  Diagnosis Date  . Mitral valve prolapse   . Thyroid disease     hypothyroidism  . Diabetes mellitus     type II  . Anxiety   . Spinal stenosis   . Venous insufficiency   . Osteoarthritis   . Hypertension   . Hyperlipidemia   . History of colon polyps   . Blood transfusion   . Heart murmur   . Osteoporosis     Assessment: 74 year old female to begin heparin for chest pain.  Renal function is stable.  No anti-coagulation prior to admission. Initial heparin level = 0.83  Goal of Therapy:  Heparin level 0.3-0.7 units/ml Monitor platelets by anticoagulation protocol: Yes   Plan:  Heparin to 900 units / hr 6 hour heparin level  Thank you. Anette Guarneri, PharmD [phone]  04/18/2014,4:03 PM

## 2014-04-18 NOTE — H&P (Signed)
[facility] History and Physical  Desiree Leon NWG:[id number] DOB: 1941-03-03 DOA: 04/17/2014  Referring physician:  PCP: Geoffery Lyons, MD   Chief Complaint: Chest pain  HPI: Desiree Leon is a 74 y.o. female with a past medical history of hypertension, dyslipidemia, mitral valve prolapse, history of chest pain undergoing cardiac catheterization in 2011 that showed mild nonobstructive coronary artery disease, presenting to the emergency department with complaints of chest pain. She reports that her chest pain started at 7:00 this evening located in the left side of the chest characterized as heavy and pressure-like with radiation to her left jaw and left shoulder, lasting 20-30 minutes and associated with nausea no emesis. Prior to this she reports intermittent chest pain mostly associated with becoming upset. She denies chest pain on exertion. In the emergency department symptoms improved with nitroglycerin. She denies fevers, chills, cough, shortness of breath. Workup in the emergency department included a troponin level which came back negative, BNP unremarkable at 68 and EKG that did not reveal acute ischemic changes. I spoke with Dr. Elias Else of cardiology who will evaluate patient this evening.                                                                                           Review of Systems:  Constitutional:  No weight loss, night sweats, Fevers, chills, fatigue.  HEENT:  No headaches, Difficulty swallowing,Tooth/dental problems,Sore throat,  No sneezing, itching, ear ache, nasal congestion, post nasal drip,  Cardio-vascular:  Positive for chest pain, Orthopnea, PND, swelling in lower extremities, anasarca, dizziness, palpitations  GI:  No heartburn, indigestion, abdominal pain, nausea, vomiting, diarrhea, change in bowel habits, loss of appetite  Resp:  No shortness of breath with exertion or at rest. No excess mucus, no productive cough, No non-productive cough,  No coughing up of blood.No change in color of mucus.No wheezing.No chest wall deformity  Skin:  no rash or lesions.  GU:  no dysuria, change in color of urine, no urgency or frequency. No flank pain.  Musculoskeletal:  No joint pain or swelling. No decreased range of motion. No back pain.  Psych:  No change in mood or affect. No depression or anxiety. No memory loss.   Past Medical History  Diagnosis Date  . Mitral valve prolapse   . Thyroid disease     hypothyroidism  . Diabetes mellitus     type II  . Anxiety   . Spinal stenosis   . Venous insufficiency   . Osteoarthritis   . Hypertension   . Hyperlipidemia   . History of colon polyps   . Blood transfusion   . Heart murmur   . Osteoporosis    Past Surgical History  Procedure Laterality Date  . Tonsillectomy  1955  . Tubal ligation  07/08/74  . Dilation and curettage of uterus  09/1976  . Appendectomy  02/14/1978  . Oophorectomy  02/14/1978  . Basal cell carcinoma excision  01/26/1990  . Colonoscopy  1993, 2003, 2008, 2012  . Squamous cell carcinoma excision    . Abdominal hysterectomy  11/26/1976  . Shoulder surgery  10/18/2007    basal  cell  . Mass excision  10/20/2011    Procedure: MINOR EXCISION OF MASS;  Surgeon: Adin Hector, MD;  Location: [facility];  Service: General;  Laterality: N/A;  Excision mass posterior neck   Social History:  reports that she quit smoking about 9 years ago. She has never used smokeless tobacco. She reports that she does not drink alcohol or use illicit drugs.  No Known Allergies  Family History  Problem Relation Age of Onset  . Heart disease Father   . Cancer Sister     breast  . Cancer Daughter     cervical and bone     Prior to Admission medications   Medication Sig Start Date End Date Taking? Authorizing Provider  acetaminophen (TYLENOL) 500 MG tablet Take 500 mg by mouth every 6 (six) hours as needed for fever or headache.    Yes Historical Provider, MD    ALPRAZolam Duanne Moron) 0.25 MG tablet Take 0.25 mg by mouth at bedtime as needed for anxiety.    Yes Historical Provider, MD  aspirin 81 MG chewable tablet Chew 81 mg by mouth daily.   Yes Historical Provider, MD  levothyroxine (SYNTHROID, LEVOTHROID) 150 MCG tablet Take 150 mcg by mouth daily before breakfast.  08/25/11  Yes Historical Provider, MD  metoprolol tartrate (LOPRESSOR) 25 MG tablet Take 25 mg by mouth 2 (two) times daily.  09/12/11  Yes Historical Provider, MD  niCARdipine (CARDENE) 20 MG capsule Take 20 mg by mouth 2 (two) times daily.  09/12/11  Yes Historical Provider, MD  VYTORIN 10-20 MG per tablet Take 1 tablet by mouth daily at 6 PM.  09/18/11  Yes Historical Provider, MD  azithromycin (ZITHROMAX Z-PAK) 250 MG tablet 2 po day one, then 1 daily x 4 days Patient not taking: Reported on 04/17/2014 06/18/13   Veryl Speak, MD  HYDROcodone-acetaminophen (NORCO) 5-325 MG per tablet Take 2 tablets by mouth every 4 (four) hours as needed. Patient not taking: Reported on 04/17/2014 06/18/13   Veryl Speak, MD  traMADol (ULTRAM) 50 MG tablet Take 1 tablet (50 mg total) by mouth every 6 (six) hours as needed. Patient not taking: Reported on 04/17/2014 06/18/13   Veryl Speak, MD   Physical Exam: Filed Vitals:   04/17/14 2100 04/17/14 2200 04/17/14 2215 04/17/14 2245  BP: 157/57 147/59 129/42 131/54  Pulse: 60 56 57 54  Temp:      Resp: 14 14 12 15   Height:      Weight:      SpO2: 97% 98% 93% 98%    Wt Readings from Last 3 Encounters:  04/17/14 73.029 kg (161 lb)  06/18/13 74.844 kg (165 lb)  11/06/11 84.936 kg (187 lb 4 oz)    General:  Appears calm and comfortable, presently in no acute distress. She is awake and alert conversive. Eyes: PERRL, normal lids, irises & conjunctiva ENT: grossly normal hearing, lips & tongue Neck: no LAD, masses or thyromegaly Cardiovascular: RRR, no m/r/g. No LE edema. Telemetry: SR, no arrhythmias  Respiratory: CTA bilaterally, no w/r/r. Normal respiratory  effort. Abdomen: soft, ntnd Skin: no rash or induration seen on limited exam Musculoskeletal: grossly normal tone BUE/BLE Psychiatric: grossly normal mood and affect, speech fluent and appropriate Neurologic: grossly non-focal.          Labs on Admission:  Basic Metabolic Panel:  Recent Labs Lab 04/17/14 2010  NA 140  K 4.0  CL 107  CO2 29  GLUCOSE 108*  BUN 15  CREATININE  0.85  CALCIUM 8.9   Liver Function Tests:  Recent Labs Lab 04/17/14 2010  AST 22  ALT 17  ALKPHOS 100  BILITOT 0.3  PROT 6.3  ALBUMIN 3.9   No results for input(s): LIPASE, AMYLASE in the last 168 hours. No results for input(s): AMMONIA in the last 168 hours. CBC:  Recent Labs Lab 04/17/14 2010  WBC 12.2*  HGB 14.1  HCT 41.9  MCV 90.1  PLT 232   Cardiac Enzymes: No results for input(s): CKTOTAL, CKMB, CKMBINDEX, TROPONINI in the last 168 hours.  BNP (last 3 results)  Recent Labs  04/17/14 2119  BNP 68.0    ProBNP (last 3 results) No results for input(s): PROBNP in the last 8760 hours.  CBG: No results for input(s): GLUCAP in the last 168 hours.  Radiological Exams on Admission: Dg Chest 2 View  04/17/2014   CLINICAL DATA:  Short of breath and chest pain for 1 day.  EXAM: CHEST  2 VIEW  COMPARISON:  [facility] dating back to 05/18/2007.  FINDINGS: Cardiopericardial silhouette is within normal limits. Mediastinal contours are normal. Aortic arch atherosclerosis is present. There is a diffuse interstitial pattern, suggesting either atypical infection or interstitial pulmonary edema. No pleural effusions are present. No airspace consolidation.  IMPRESSION: Diffuse nonspecific interstitial pattern, typically associated with either interstitial pulmonary edema or atypical pulmonary infection such as mycoplasma.   Electronically Signed   By: Dereck Ligas M.D.   On: 04/17/2014 20:34    EKG: Independently reviewed. No ischemic changes noted.  Assessment/Plan Principal Problem:   Chest  pain Active Problems:   Mitral valve prolapse   Hypothyroid   Diabetes mellitus, type II   Dyslipidemia   1. Chest pain. Patient presenting with chest pain having typical features, with initial workup in the emergency department unremarkable thus far. She had a cardiac catheterization performed in 2011 which showed mild nonobstructive coronary artery disease. Case was discussed with Dr. Elias Else of cardiology feeling that a Myoview may be a possibility. For now we'll hold off on anticoagulation unless chest pain symptoms worsen or troponins turn positive. Will continue aspirin, beta blocker, statin, as needed nitroglycerin. Plan to cycle troponins overnight, repeat EKG in a.m., await further recommendations from cardiology. 2. Question community-acquired pneumonia. Patient denies fevers, chills, purulent sputum production however chest x-ray performed in the emergency department reported by radiology to have diffuse nonspecific interstitial pattern could be associated with pulmonary edema or atypical infection such as mycoplasma. Will cover with azithromycin 500 mg by mouth daily, repeat chest x-ray in a.m. Her chest pain appears to have more typical features of cardiac origin. 3. Hypothyroidism. Will check a TSH, continue Synthroid 150 g by mouth daily 4. Hypertension. Blood pressures improving in the emergency department. Will continue metoprolol 25 mg by mouth twice a day and Cardene 20 mg by mouth twice a day 5. Dyslipidemia. Will check fasting lipid panel, continue statin therapy 6. DVT prophylaxis. Lovenox   Code Status: Full code Family Communication: Spoke with multiple family members present at bedside Disposition Plan: Will admit to inpatient service anticipate will require greater than 2 nights hospitalization  Time spent: 70 minutes  Kelvin Cellar [facility] Pager [phone]

## 2014-04-18 NOTE — Progress Notes (Signed)
TELEMETRY: Reviewed telemetry pt in NSR: Filed Vitals:   04/18/14 0030 04/18/14 0045 04/18/14 0146 04/18/14 0429  BP: 111/46 131/60 146/77 133/46  Pulse: 53 53 55 57  Temp:   98.5 F (36.9 C) 98.4 F (36.9 C)  TempSrc:   Oral Oral  Resp: 15 17  18   Height:      Weight:      SpO2: 98% 99% 100% 100%   No intake or output data in the 24 hours ending 04/18/14 0907 Filed Weights   04/17/14 2004  Weight: 161 lb (73.029 kg)    Subjective Continues to have active chest pain left precordial area. Left arm and jaw pain resolved. No SOB.  Derrill Memo ON 04/19/2014] aspirin  81 mg Oral Pre-Cath  . aspirin EC  325 mg Oral Daily  . azithromycin  500 mg Oral Daily  . ezetimibe-simvastatin  1 tablet Oral q1800  . heparin  4,000 Units Intravenous Once  . levothyroxine  150 mcg Oral QAC breakfast  . metoprolol tartrate  25 mg Oral BID  . niCARdipine  20 mg Oral BID  . sodium chloride  3 mL Intravenous Q12H   . sodium chloride 75 mL/hr at 04/18/14 0245  . heparin    . nitroGLYCERIN      LABS: Basic Metabolic Panel:  Recent Labs  04/17/14 2010  NA 140  K 4.0  CL 107  CO2 29  GLUCOSE 108*  BUN 15  CREATININE 0.85  CALCIUM 8.9   Liver Function Tests:  Recent Labs  04/17/14 2010  AST 22  ALT 17  ALKPHOS 100  BILITOT 0.3  PROT 6.3  ALBUMIN 3.9   No results for input(s): LIPASE, AMYLASE in the last 72 hours. CBC:  Recent Labs  04/17/14 2010 04/18/14 0600  WBC 12.2* 12.7*  HGB 14.1 12.8  HCT 41.9 38.4  MCV 90.1 89.7  PLT 232 217   Cardiac Enzymes:  Recent Labs  04/18/14 0408 04/18/14 0600  TROPONINI <0.03 0.15*   BNP: No results for input(s): PROBNP in the last 72 hours. D-Dimer: No results for input(s): DDIMER in the last 72 hours. Hemoglobin A1C: No results for input(s): HGBA1C in the last 72 hours. Fasting Lipid Panel:  Recent Labs  04/18/14 0600  CHOL 125  HDL 38*  LDLCALC 74  TRIG 65  CHOLHDL 3.3   Thyroid Function Tests: No results  for input(s): TSH, T4TOTAL, T3FREE, THYROIDAB in the last 72 hours.  Invalid input(s): FREET3   Radiology/Studies:  Dg Chest 2 View  04/17/2014   CLINICAL DATA:  Short of breath and chest pain for 1 day.  EXAM: CHEST  2 VIEW  COMPARISON:  [facility] dating back to 05/18/2007.  FINDINGS: Cardiopericardial silhouette is within normal limits. Mediastinal contours are normal. Aortic arch atherosclerosis is present. There is a diffuse interstitial pattern, suggesting either atypical infection or interstitial pulmonary edema. No pleural effusions are present. No airspace consolidation.  IMPRESSION: Diffuse nonspecific interstitial pattern, typically associated with either interstitial pulmonary edema or atypical pulmonary infection such as mycoplasma.   Electronically Signed   By: Dereck Ligas M.D.   On: 04/17/2014 20:34   Ecg: NSR with normal Ecg  PHYSICAL EXAM General: Well developed, overweight, in no acute distress. Head: Normocephalic, atraumatic, sclera non-icteric, oropharynx is clear Neck: Negative for carotid bruits. JVD not elevated. No adenopathy Lungs: Clear bilaterally to auscultation without wheezes, rales, or rhonchi. Breathing is unlabored. Heart: RRR S1 S2 without murmurs, rubs, or gallops.  Abdomen: Soft,  non-tender, non-distended with normoactive bowel sounds. No hepatomegaly. No rebound/guarding. No obvious abdominal masses. Msk:  Strength and tone appears normal for age. Extremities: No clubbing, cyanosis or edema.  Distal pedal pulses are 2+ and equal bilaterally. Neuro: Alert and oriented X 3. Moves all extremities spontaneously. Psych:  Responds to questions appropriately with a normal affect.  ASSESSMENT AND PLAN: 1. NSTEMI. Still having active chest pain. Will start IV heparin and IV Ntg. Continue beta blocker, ASA, and statin. Morphine for pain relief. Will cancel stress test. Plan LHC with possible PCI later today. Last cath in 2009 showed no obstructive disease. 2.  Hyperlipidemia on Vytorin with good control. 3. DM type 2. 4. HTN controlled.   Present on Admission:  . Chest pain . Mitral valve prolapse . Hypothyroid . Dyslipidemia  Signed, Haiden Clucas [place], [place] 04/18/2014 9:07 AM

## 2014-04-18 NOTE — Progress Notes (Signed)
   04/18/14 1000  Clinical Encounter Type  Visited With Patient and family together  Visit Type Initial;Spiritual support;Social support  Referral From Nurse  Spiritual Encounters  Spiritual Needs Emotional;Grief support  Stress Factors  Patient Stress Factors Health changes  Family Stress Factors Health changes   Chaplain was referred to patient via spiritual care consult. Chaplain visited with patient very briefly this morning. Patient was being visited by several family members this morning. Patient's family indicated that they are very close-knit and support each other whenever someone is in the hospital. Patient seems very well supported at this time. Chaplain will continue to provide emotional and spiritual support for patient and patient's family as needed. Gar Ponto, Chaplain  10:55 AM

## 2014-04-19 ENCOUNTER — Encounter (HOSPITAL_COMMUNITY): Payer: Self-pay | Admitting: Cardiology

## 2014-04-19 ENCOUNTER — Inpatient Hospital Stay (HOSPITAL_COMMUNITY): Payer: Medicare Other

## 2014-04-19 LAB — CBC
HCT: 38.4 % (ref 36.0–46.0)
Hemoglobin: 13 g/dL (ref 12.0–15.0)
MCH: 30.1 pg (ref 26.0–34.0)
MCHC: 33.9 g/dL (ref 30.0–36.0)
MCV: 88.9 fL (ref 78.0–100.0)
Platelets: 200 10*3/uL (ref 150–400)
RBC: 4.32 MIL/uL (ref 3.87–5.11)
RDW: 13.5 % (ref 11.5–15.5)
WBC: 11.3 10*3/uL — ABNORMAL HIGH (ref 4.0–10.5)

## 2014-04-19 LAB — GLUCOSE, CAPILLARY
Glucose-Capillary: 98 mg/dL (ref 70–99)
Glucose-Capillary: 107 mg/dL — ABNORMAL HIGH (ref 70–99)
Glucose-Capillary: 100 mg/dL — ABNORMAL HIGH (ref 70–99)
Glucose-Capillary: 100 mg/dL — ABNORMAL HIGH (ref 70–99)
Glucose-Capillary: 137 mg/dL — ABNORMAL HIGH (ref 70–99)
Glucose-Capillary: 115 mg/dL — ABNORMAL HIGH (ref 70–99)
Glucose-Capillary: 159 mg/dL — ABNORMAL HIGH (ref 70–99)
Glucose-Capillary: 105 mg/dL — ABNORMAL HIGH (ref 70–99)

## 2014-04-19 LAB — BASIC METABOLIC PANEL
Anion gap: 9 (ref 5–15)
BUN: 8 mg/dL (ref 6–23)
CO2: 19 mmol/L (ref 19–32)
Calcium: 8.5 mg/dL (ref 8.4–10.5)
Chloride: 110 mmol/L (ref 96–112)
Creatinine, Ser: 0.61 mg/dL (ref 0.50–1.10)
GFR calc Af Amer: >90 mL/min (ref >90)
GFR calc non Af Amer: 87 mL/min — ABNORMAL LOW (ref >90)
Glucose, Bld: 101 mg/dL — ABNORMAL HIGH (ref 70–99)
Potassium: 3.6 mmol/L (ref 3.5–5.1)
Sodium: 138 mmol/L (ref 135–145)

## 2014-04-19 MED ORDER — PANTOPRAZOLE SODIUM 40 MG PO TBEC
40.0000 mg | DELAYED_RELEASE_TABLET | Freq: Every day | ORAL | Status: DC
  Administered 2014-04-19 – 2014-04-21 (×3): 40 mg via ORAL
  Filled 2014-04-19 (×3): qty 1

## 2014-04-19 MED ORDER — MORPHINE SULFATE 2 MG/ML IJ SOLN
2.0000 mg | Freq: Once | INTRAMUSCULAR | Status: AC
  Administered 2014-04-19: 2 mg via INTRAVENOUS
  Filled 2014-04-19: qty 1

## 2014-04-19 MED ORDER — AZITHROMYCIN 250 MG PO TABS
250.0000 mg | ORAL_TABLET | Freq: Every day | ORAL | Status: DC
  Administered 2014-04-20 – 2014-04-21 (×2): 250 mg via ORAL
  Filled 2014-04-19 (×2): qty 1

## 2014-04-19 MED FILL — Sodium Chloride IV Soln 0.9%: INTRAVENOUS | Qty: 50 | Status: AC

## 2014-04-19 NOTE — Progress Notes (Signed)
    Subjective:  Feels much better today. No CP or dyspnea.  Objective:  Vital Signs in the last 24 hours: Temp:  [98.3 F (36.8 C)-99.3 F (37.4 C)] 98.6 F (37 C) (02/10 0830) Pulse Rate:  [52-70] 70 (02/10 1000) Resp:  [13-21] 15 (02/10 1000) BP: (130-180)/(46-106) 143/106 mmHg (02/10 1000) SpO2:  [94 %-99 %] 96 % (02/10 1000)  Intake/Output from previous day: 02/09 0701 - 02/10 0700 In: 1818.8 [I.V.:1818.8] Out: 1350 [Urine:1350]  Physical Exam: Pt is alert and oriented, NAD HEENT: normal Neck: JVP - normal Lungs: CTA bilaterally CV: RRR without murmur or gallop Abd: soft, NT, Positive BS, no hepatomegaly Ext: no C/C/E, distal pulses intact and equal, right radial site clear Skin: warm/dry no rash   Lab Results:  Recent Labs  04/18/14 0600 04/19/14 0215  WBC 12.7* 11.3*  HGB 12.8 13.0  PLT 217 200    Recent Labs  04/17/14 2010 04/19/14 0215  NA 140 138  K 4.0 3.6  CL 107 110  CO2 29 19  GLUCOSE 108* 101*  BUN 15 8  CREATININE 0.85 0.61    Recent Labs  04/18/14 0600 04/18/14 0844  TROPONINI 0.15* <0.03    Cardiac Studies: Cardiac Cath: Final Conclusions:  1. Single vessel occlusive CAD 2. Normal LV function 3. Successful POBA of the mid LAD and second diagonal   Recommendations:  Although the final angioplasty result was suboptimal there was restoration of good flow and the patient was pain free. The LAD is very small in caliber and not suitable for stenting. Hopefully with medical therapy the vessel will heal well. I would continue DAPT for a year if tolerated with ASA and Plavix.  Tele: Sinus rhythm  Assessment/Plan:  1. NSTEMI - s/p PTCA, appears stable. Continue ASA/plavix at least 12 months if tolerated.  2. Hyperlipidemia on Vytorin with good control. 3. DM type 2. 4. HTN controlled.   Appears stable. Meds reviewed and appropriate. Pt lives independently and takes care of handicapped son (lots of stress). Would  ambulate/observe today and anticipate d/c tomorrow.  Sherren Mocha, M.D. 04/19/2014, 2:02 PM

## 2014-04-19 NOTE — Progress Notes (Signed)
Shift event: RN paged secondary to pt having 8/10 jaw pain radiating to the chest, similar in nature, to what she had on admission. Pt had cardio procedure today but blood flow not completely restored per cardio note. EKG showed no acute changes. Nitro x 2 and Morphine x 2 given with pain decreased to a 2/10. Vital signs stable. No respiratory distress. Pt continues on Plavix and ASA post procedure for 12 months. Check troponins. Should chest pain return and not be resolved, or if troponin elevated, or if EKG changes, will inform cardio. Clance Boll, NP [facility]

## 2014-04-19 NOTE — Progress Notes (Signed)
CARDIAC REHAB PHASE I   PRE:  Rate/Rhythm: 56 SB  BP:  Supine: 137/46  Sitting:   Standing:    SaO2: 97%RA  MODE:  Ambulation: 350 ft   POST:  Rate/Rhythm: 71 SR  BP:  Supine:   Sitting: 142/49  Standing:    SaO2: 97%RA 1200-1253 Pt walked 350 ft with asst x 1 with steady gait. Tolerated well. No CP. MI ed completed with pt and daughter who voiced understanding. Discussed CRP 2 and pt gave permission to refer to [facility] program. Reviewed NTG use, ex ed, MI restrictions, and importance of plavix. To recliner after walk.   Graylon Good, RN BSN  04/19/2014 12:47 PM

## 2014-04-19 NOTE — Progress Notes (Signed)
[facility] PROGRESS NOTE  Desiree Leon ZOX:[id number] DOB: 12-16-1940 DOA: 04/17/2014 PCP: Geoffery Lyons, MD  Assessment/Plan: 1-chest pain/NSTEMI: status post left cath and PCI -patient with severely occluded LAD/second diagonal -plan is for continue risk factor modification and use of ASA/Plavix -will advance diet to heart healthy and start cardiac rehab -will move to telemetry bed -will follow cardiology rec's  2-bronchitis/early PNA: will continue tx with zithromax -no fever, normal WBC's -denies SOB -day 2/5  3-hypothyroidism: continue synthroid  4-HTN: overall stable and well controlled -continue antihypertensive regimen  5-GERD: continue PPI  6-anxiety: continue PRN xanax  Code Status: Full Family Communication: daughters at bedside Disposition Plan: home when medically stable; most likely in 24 hours.   Consultants:  Cardiology   Procedures:  Left cath 04/18/14  Vessel - LAD/Segment - mid Percent Stenosis (pre) 100% TIMI-flow 0 POBA of the LAD and second diagonal Percent Stenosis (post) 80%/50% TIMI-flow (post) 2/3  Final Conclusions:  1. Single vessel occlusive CAD 2. Normal LV function 3. Successful POBA of the mid LAD and second diagonal   Antibiotics:  zithromax 2/9 (plan is to treat for 5 days)  HPI/Subjective: Just slight intermittent discomfort in her chest; no SOB, no orthopnea; no fever.  Objective: Filed Vitals:   04/19/14 1000  BP: 143/106  Pulse: 70  Temp:   Resp: 15    Intake/Output Summary (Last 24 hours) at 04/19/14 1052 Last data filed at 04/19/14 0300  Gross per 24 hour  Intake 1818.75 ml  Output   1350 ml  Net 468.75 ml   Filed Weights   04/17/14 2004 04/18/14 0907  Weight: 73.029 kg (161 lb) 73 kg (160 lb 15 oz)    Exam:   General:  Just soreness intermittently in her chest; no SOB; afebrile  Cardiovascular: RRR, no rubs or gallops  Respiratory: CTA bilaterally  Abdomen: soft, NT, ND,  positive BS  Musculoskeletal: no edema, no cyanosis   Data Reviewed: Basic Metabolic Panel:  Recent Labs Lab 04/17/14 2010 04/19/14 0215  NA 140 138  K 4.0 3.6  CL 107 110  CO2 29 19  GLUCOSE 108* 101*  BUN 15 8  CREATININE 0.85 0.61  CALCIUM 8.9 8.5   Liver Function Tests:  Recent Labs Lab 04/17/14 2010  AST 22  ALT 17  ALKPHOS 100  BILITOT 0.3  PROT 6.3  ALBUMIN 3.9   CBC:  Recent Labs Lab 04/17/14 2010 04/18/14 0600 04/19/14 0215  WBC 12.2* 12.7* 11.3*  HGB 14.1 12.8 13.0  HCT 41.9 38.4 38.4  MCV 90.1 89.7 88.9  PLT 232 217 200   Cardiac Enzymes:  Recent Labs Lab 04/18/14 0408 04/18/14 0600 04/18/14 0844  TROPONINI <0.03 0.15* <0.03   BNP (last 3 results)  Recent Labs  04/17/14 2119  BNP 68.0   CBG:  Recent Labs Lab 04/18/14 1840 04/18/14 1935 04/18/14 2319 04/19/14 0314 04/19/14 0830  GLUCAP 91 96 119* 100* 137*    Recent Results (from the past 240 hour(s))  MRSA PCR Screening     Status: None   Collection Time: 04/18/14  7:07 PM  Result Value Ref Range Status   MRSA by PCR NEGATIVE NEGATIVE Final    Comment:        The GeneXpert MRSA Assay (FDA approved for NASAL specimens only), is one component of a comprehensive MRSA colonization surveillance program. It is not intended to diagnose MRSA infection nor to guide or monitor treatment for MRSA infections.      Studies: Dg  Chest 2 View  04/17/2014   CLINICAL DATA:  Short of breath and chest pain for 1 day.  EXAM: CHEST  2 VIEW  COMPARISON:  [facility] dating back to 05/18/2007.  FINDINGS: Cardiopericardial silhouette is within normal limits. Mediastinal contours are normal. Aortic arch atherosclerosis is present. There is a diffuse interstitial pattern, suggesting either atypical infection or interstitial pulmonary edema. No pleural effusions are present. No airspace consolidation.  IMPRESSION: Diffuse nonspecific interstitial pattern, typically associated with either  interstitial pulmonary edema or atypical pulmonary infection such as mycoplasma.   Electronically Signed   By: Dereck Ligas M.D.   On: 04/17/2014 20:34    Scheduled Meds: . aspirin  81 mg Oral Daily  . azithromycin  500 mg Oral Daily  . clopidogrel  75 mg Oral Q breakfast  . ezetimibe-simvastatin  1 tablet Oral q1800  . levothyroxine  150 mcg Oral QAC breakfast  . metoprolol tartrate  25 mg Oral BID  . niCARdipine  20 mg Oral BID   Continuous Infusions: . sodium chloride Stopped (04/19/14 0300)    Principal Problem:   Chest pain Active Problems:   Mitral valve prolapse   Hypothyroid   Dyslipidemia   NSTEMI (non-ST elevated myocardial infarction)   CAP (community acquired pneumonia)   Pain in the chest    Time spent: 30 minutes   Barton Dubois  [facility] Pager [phone]. If 7PM-7AM, please contact night-coverage at [email or web address], password [facility] 04/19/2014, 10:52 AM  LOS: 1 day

## 2014-04-20 LAB — CBC
HCT: 38.5 % (ref 36.0–46.0)
Hemoglobin: 12.7 g/dL (ref 12.0–15.0)
MCH: 30.2 pg (ref 26.0–34.0)
MCHC: 33 g/dL (ref 30.0–36.0)
MCV: 91.7 fL (ref 78.0–100.0)
Platelets: 193 10*3/uL (ref 150–400)
RBC: 4.2 MIL/uL (ref 3.87–5.11)
RDW: 13.4 % (ref 11.5–15.5)
WBC: 10 10*3/uL (ref 4.0–10.5)

## 2014-04-20 LAB — GLUCOSE, CAPILLARY
Glucose-Capillary: 95 mg/dL (ref 70–99)
Glucose-Capillary: 126 mg/dL — ABNORMAL HIGH (ref 70–99)
Glucose-Capillary: 100 mg/dL — ABNORMAL HIGH (ref 70–99)
Glucose-Capillary: 159 mg/dL — ABNORMAL HIGH (ref 70–99)

## 2014-04-20 LAB — TROPONIN I: Troponin I: 0.07 ng/mL — ABNORMAL HIGH (ref <0.031)

## 2014-04-20 MED ORDER — INSULIN ASPART 100 UNIT/ML ~~LOC~~ SOLN: 0.0000 [IU] | Freq: Three times a day (TID) | SUBCUTANEOUS | Status: DC

## 2014-04-20 MED ORDER — ISOSORBIDE MONONITRATE ER 60 MG PO TB24
60.0000 mg | ORAL_TABLET | Freq: Every day | ORAL | Status: DC
  Administered 2014-04-20 – 2014-04-21 (×2): 60 mg via ORAL
  Filled 2014-04-20 (×2): qty 1

## 2014-04-20 MED ORDER — POLYETHYLENE GLYCOL 3350 17 G PO PACK
17.0000 g | PACK | Freq: Every day | ORAL | Status: DC
  Filled 2014-04-20 (×2): qty 1

## 2014-04-20 NOTE — Progress Notes (Addendum)
CARDIAC REHAB PHASE I   PRE:  Rate/Rhythm: 67  Off moniter  BP:  Supine:   Sitting: 116/70  Standing:    SaO2: 97 RA  MODE:  Ambulation: 550 ft   POST:  Rate/Rhythm: 58  BP:  Supine:   Sitting: 140/70  Standing:     SaO2: 96 RA 1525-1545 Pt tolerated ambulation well without c/o of cp. VS stable Pt back to side of bed after walk with call light in reach and daughters present.  Rodney Langton RN 04/20/2014 3:44 PM

## 2014-04-20 NOTE — Progress Notes (Signed)
    Subjective:  Patient developed episode of severe chest and jaw pain last night. Relieved with Ntg and Morphine. This am had recurrent pain relieved with one Ntg.   Objective:  Vital Signs in the last 24 hours: Temp:  [97.6 F (36.4 C)-99 F (37.2 C)] 98.8 F (37.1 C) (02/11 1149) Pulse Rate:  [51-76] 56 (02/11 1149) Resp:  [11-29] 16 (02/11 1149) BP: (113-182)/(42-94) 113/52 mmHg (02/11 1149) SpO2:  [92 %-99 %] 95 % (02/11 1149) Weight:  [161 lb 6 oz (73.2 kg)] 161 lb 6 oz (73.2 kg) (02/11 0106)  Intake/Output from previous day: 02/10 0701 - 02/11 0700 In: 360 [P.O.:360] Out: -   Physical Exam: Pt is alert and oriented, NAD HEENT: normal Neck: JVP - normal Lungs: CTA bilaterally CV: RRR without murmur or gallop Abd: soft, NT, Positive BS, no hepatomegaly Ext: no C/C/E, distal pulses intact and equal, right radial site clear Skin: warm/dry no rash   Lab Results:  Recent Labs  04/19/14 0215 04/20/14 0537  WBC 11.3* 10.0  HGB 13.0 12.7  PLT 200 193    Recent Labs  04/17/14 2010 04/19/14 0215  NA 140 138  K 4.0 3.6  CL 107 110  CO2 29 19  GLUCOSE 108* 101*  BUN 15 8  CREATININE 0.85 0.61    Recent Labs  04/18/14 0844 04/20/14 0537  TROPONINI <0.03 0.07*   Ecg last pm was normal.  Cardiac Studies: Cardiac Cath: Final Conclusions:  1. Single vessel occlusive CAD 2. Normal LV function 3. Successful POBA of the mid LAD and second diagonal   Recommendations:  Although the final angioplasty result was suboptimal there was restoration of good flow and the patient was pain free. The LAD is very small in caliber and not suitable for stenting. Hopefully with medical therapy the vessel will heal well. I would continue DAPT for a year if tolerated with ASA and Plavix.  Tele: Sinus rhythm  Assessment/Plan:  1. NSTEMI - s/p PTCA, patient had recurrent chest pain last night and this am. Troponin slightly elevated. It is not surprising that she has  recurrent angina. Her distal LAD was small and diffusely diseased. PTCA was suboptimal with POBA but the vessel was not suitable for stenting. She is not a candidate for any further intervention. She does have some right to left collaterals. We will need to optimize antianginal therapy. She is well beta blocked on metoprolol. Will add long acting nitrate. I cancelled further troponins since this will not alter therapy. Continue DAPT. We will need to observe here another day. 2. Hyperlipidemia on Vytorin with good control. 3. DM type 2. 4. HTN controlled.     Woodson Macha [place], M.D. 04/20/2014, 12:03 PM

## 2014-04-20 NOTE — Care Management Note (Signed)
    Page 1 of 1   04/20/2014     2:27:41 PM CARE MANAGEMENT NOTE 04/20/2014  Patient:  Desiree Leon, Desiree Leon   Account Number:  [id number]  Date Initiated:  04/19/2014  Documentation initiated by:  Elissa Hefty  Subjective/Objective Assessment:   adm w ch pain     Action/Plan:   lives alone, pcp dr Burnard Bunting   Anticipated DC Date:     Anticipated DC Plan:  HOME/SELF CARE         Choice offered to / List presented to:             Status of service:   Medicare Important Message given?  YES (If response is "NO", the following Medicare IM given date fields will be blank) Date Medicare IM given:  04/20/2014 Medicare IM given by:  Whitman Hero Date Additional Medicare IM given:   Additional Medicare IM given by:    Discharge Disposition:  HOME/SELF CARE  Per UR Regulation:  Reviewed for med. necessity/level of care/duration of stay  If discussed at [place] of Stay Meetings, dates discussed:    Comments:

## 2014-04-20 NOTE — Progress Notes (Signed)
[facility] PROGRESS NOTE  Desiree Leon GGY:[id number] DOB: [birth year] DOA: 04/17/2014 PCP: Geoffery Lyons, MD  Assessment/Plan: 1-chest pain/NSTEMI: status post left cath and PTCA -patient with severely occluded LAD/second diagonal (unable to have complete flow restoration) -started today on imdur for antianginal purposes  -plan is for continue risk factor modification and use of ASA/Plavix for 12 months -will continue heart healthy diet -will continue monitoring on telemetry bed -will follow cardiology rec's  2-bronchitis/early PNA: will continue tx with zithromax -no fever, normal WBC's -denies SOB or cough -day 3/5  3-hypothyroidism: continue synthroid  4-HTN: overall stable and well controlled -continue antihypertensive regimen  5-GERD: continue PPI  6-anxiety: continue PRN xanax  7-diabetes type 2 well controlled -last A1C 6.3 -following just diet controlled   Code Status: Full Family Communication: daughters at bedside Disposition Plan: home when medically stable; most likely in 24 hours.   Consultants:  Cardiology   Procedures:  Left cath 04/18/14  Vessel - LAD/Segment - mid Percent Stenosis (pre) 100% TIMI-flow 0 POBA of the LAD and second diagonal Percent Stenosis (post) 80%/50% TIMI-flow (post) 2/3  Final Conclusions:  1. Single vessel occlusive CAD 2. Normal LV function 3. Successful POBA of the mid LAD and second diagonal   Antibiotics:  zithromax 2/9 (plan is to treat for 5 days)  HPI/Subjective: No SOB, no orthopnea; no fever. Patient with 8/10 pain radiated to her jaw and left arm overnight; this morning still with some discomfort but better.   Objective: Filed Vitals:   04/20/14 1149  BP: 113/52  Pulse: 56  Temp: 98.8 F (37.1 C)  Resp: 16    Intake/Output Summary (Last 24 hours) at 04/20/14 1233 Last data filed at 04/20/14 0730  Gross per 24 hour  Intake    480 ml  Output      0 ml  Net    480 ml   Filed  Weights   04/17/14 2004 04/18/14 0907 04/20/14 0106  Weight: 73.029 kg (161 lb) 73 kg (160 lb 15 oz) 73.2 kg (161 lb 6 oz)    Exam:   General:  Patient with CP overnight and still chest discomfort this morning. No fever, no cough, no palpitations  Cardiovascular: RRR, no rubs or gallops  Respiratory: CTA bilaterally  Abdomen: soft, NT, ND, positive BS  Musculoskeletal: no edema, no cyanosis   Data Reviewed: Basic Metabolic Panel:  Recent Labs Lab 04/17/14 2010 04/19/14 0215  NA 140 138  K 4.0 3.6  CL 107 110  CO2 29 19  GLUCOSE 108* 101*  BUN 15 8  CREATININE 0.85 0.61  CALCIUM 8.9 8.5   Liver Function Tests:  Recent Labs Lab 04/17/14 2010  AST 22  ALT 17  ALKPHOS 100  BILITOT 0.3  PROT 6.3  ALBUMIN 3.9   CBC:  Recent Labs Lab 04/17/14 2010 04/18/14 0600 04/19/14 0215 04/20/14 0537  WBC 12.2* 12.7* 11.3* 10.0  HGB 14.1 12.8 13.0 12.7  HCT 41.9 38.4 38.4 38.5  MCV 90.1 89.7 88.9 91.7  PLT 232 217 200 193   Cardiac Enzymes:  Recent Labs Lab 04/18/14 0408 04/18/14 0600 04/18/14 0844 04/20/14 0537  TROPONINI <0.03 0.15* <0.03 0.07*   BNP (last 3 results)  Recent Labs  04/17/14 2119  BNP 68.0   CBG:  Recent Labs Lab 04/19/14 1615 04/19/14 1953 04/19/14 2321 04/20/14 0629 04/20/14 1146  GLUCAP 115* 159* 105* 95 126*    Recent Results (from the past 240 hour(s))  MRSA PCR Screening  Status: None   Collection Time: 04/18/14  7:07 PM  Result Value Ref Range Status   MRSA by PCR NEGATIVE NEGATIVE Final    Comment:        The GeneXpert MRSA Assay (FDA approved for NASAL specimens only), is one component of a comprehensive MRSA colonization surveillance program. It is not intended to diagnose MRSA infection nor to guide or monitor treatment for MRSA infections.      Studies: Dg Chest 2 View  04/19/2014   CLINICAL DATA:  Pneumonia.  EXAM: CHEST  2 VIEW  COMPARISON:  April 17, 2014.  FINDINGS: The heart size and  mediastinal contours are within normal limits. No pneumothorax or plural effusion is noted. Degenerative changes are noted in lower thoracic spine anteriorly. Interstitial densities noted on prior exam are not well visualized currently. No acute pulmonary disease is noted.  IMPRESSION: No acute cardiopulmonary abnormality seen.   Electronically Signed   By: Marijo Conception, M.D.   On: 04/19/2014 13:36    Scheduled Meds: . aspirin  81 mg Oral Daily  . azithromycin  250 mg Oral Daily  . clopidogrel  75 mg Oral Q breakfast  . ezetimibe-simvastatin  1 tablet Oral q1800  . isosorbide mononitrate  60 mg Oral Daily  . levothyroxine  150 mcg Oral QAC breakfast  . metoprolol tartrate  25 mg Oral BID  . niCARdipine  20 mg Oral BID  . pantoprazole  40 mg Oral Q1200   Continuous Infusions:    Principal Problem:   Chest pain Active Problems:   Mitral valve prolapse   Hypothyroid   Dyslipidemia   NSTEMI (non-ST elevated myocardial infarction)   CAP (community acquired pneumonia)   Pain in the chest   Time spent: 30 minutes   Barton Dubois  [facility] Pager [phone]. If 7PM-7AM, please contact night-coverage at [email or web address], password [facility] 04/20/2014, 12:33 PM  LOS: 2 days

## 2014-04-21 DIAGNOSIS — K219 Gastro-esophageal reflux disease without esophagitis: Secondary | ICD-10-CM

## 2014-04-21 LAB — CBC
HCT: 35.2 % — ABNORMAL LOW (ref 36.0–46.0)
Hemoglobin: 11.6 g/dL — ABNORMAL LOW (ref 12.0–15.0)
MCH: 29.6 pg (ref 26.0–34.0)
MCHC: 33 g/dL (ref 30.0–36.0)
MCV: 89.8 fL (ref 78.0–100.0)
Platelets: 202 10*3/uL (ref 150–400)
RBC: 3.92 MIL/uL (ref 3.87–5.11)
RDW: 13.3 % (ref 11.5–15.5)
WBC: 11.5 10*3/uL — ABNORMAL HIGH (ref 4.0–10.5)

## 2014-04-21 LAB — BASIC METABOLIC PANEL
Anion gap: 7 (ref 5–15)
BUN: 12 mg/dL (ref 6–23)
CO2: 23 mmol/L (ref 19–32)
Calcium: 8.9 mg/dL (ref 8.4–10.5)
Chloride: 108 mmol/L (ref 96–112)
Creatinine, Ser: 0.84 mg/dL (ref 0.50–1.10)
GFR calc Af Amer: 77 mL/min — ABNORMAL LOW (ref >90)
GFR calc non Af Amer: 67 mL/min — ABNORMAL LOW (ref >90)
Glucose, Bld: 118 mg/dL — ABNORMAL HIGH (ref 70–99)
Potassium: 4.2 mmol/L (ref 3.5–5.1)
Sodium: 138 mmol/L (ref 135–145)

## 2014-04-21 LAB — HEMOGLOBIN A1C
Hgb A1c MFr Bld: 6 % — ABNORMAL HIGH (ref 4.8–5.6)
Mean Plasma Glucose: 126 mg/dL

## 2014-04-21 LAB — GLUCOSE, CAPILLARY
Glucose-Capillary: 113 mg/dL — ABNORMAL HIGH (ref 70–99)
Glucose-Capillary: 94 mg/dL (ref 70–99)

## 2014-04-21 MED ORDER — PANTOPRAZOLE SODIUM 40 MG PO TBEC: 40.0000 mg | DELAYED_RELEASE_TABLET | Freq: Every day | ORAL | Status: AC

## 2014-04-21 MED ORDER — NITROGLYCERIN 0.4 MG SL SUBL: 0.4000 mg | SUBLINGUAL_TABLET | SUBLINGUAL | Status: DC | PRN

## 2014-04-21 MED ORDER — POLYETHYLENE GLYCOL 3350 17 G PO PACK: 17.0000 g | PACK | Freq: Every day | ORAL | Status: DC

## 2014-04-21 MED ORDER — ISOSORBIDE MONONITRATE ER 60 MG PO TB24: 60.0000 mg | ORAL_TABLET | Freq: Every day | ORAL | Status: DC

## 2014-04-21 MED ORDER — CLOPIDOGREL BISULFATE 75 MG PO TABS: 75.0000 mg | ORAL_TABLET | Freq: Every day | ORAL | Status: DC

## 2014-04-21 MED ORDER — AZITHROMYCIN 250 MG PO TABS: ORAL_TABLET | ORAL | Status: DC

## 2014-04-21 NOTE — Discharge Summary (Signed)
Physician Discharge Summary  Desiree Leon JQB:[id number] DOB: November 29, 1940 DOA: 04/17/2014  PCP: Geoffery Lyons, MD  Admit date: 04/17/2014 Discharge date: 04/21/2014  Time spent: >30 minutes  Recommendations for Outpatient Follow-up:  BMET to follow electrolytes and renal function Reassess BP and adjust medications as needed CXR in 3 weeks to follow resolution of lungs infiltrates  Discharge Diagnoses:  Principal Problem:   Chest pain Active Problems:   Mitral valve prolapse   Hypothyroid   Dyslipidemia   NSTEMI (non-ST elevated myocardial infarction)   CAP (community acquired pneumonia)   Pain in the chest   Discharge Condition: stable and improved. Discharge home with instruction to follow with cardiology on 04/27/14 (appointment made) and also withy PCP in 10 days.  Diet recommendation: heart healthy and low carb diet  Filed Weights   04/17/14 2004 04/18/14 0907 04/20/14 0106  Weight: 73.029 kg (161 lb) 73 kg (160 lb 15 oz) 73.2 kg (161 lb 6 oz)    History of present illness:  74 y.o. female with a past medical history of hypertension, dyslipidemia, mitral valve prolapse, history of chest pain undergoing cardiac catheterization in 2011 that showed mild nonobstructive coronary artery disease, presenting to the emergency department with complaints of chest pain. She reports that her chest pain started at 7:00 this evening located in the left side of the chest characterized as heavy and pressure-like with radiation to her left jaw and left shoulder, lasting 20-30 minutes and associated with nausea no emesis. Prior to this she reports intermittent chest pain mostly associated with becoming upset. She denies chest pain on exertion. In the emergency department symptoms improved with nitroglycerin. She denies fevers, chills, cough, shortness of breath. Workup in the emergency department included a troponin level which came back negative, BNP unremarkable at 68 and EKG that did not reveal  acute ischemic changes.  Hospital Course:  1-chest pain/NSTEMI: status post left cath and PTCA -patient with severely occluded LAD/second diagonal (unable to have complete flow restoration) -started on imdur for antianginal purposes  -PRN NTG as needed -plan is for continue risk factor modification and use of ASA/Plavix for 12 months -encourage to follow heart healthy diet -outpatient follow up with cardiology as instructed  2-bronchitis/early PNA: will continue tx with zithromax -no fever, normal WBC's -denies SOB or cough -day 4/5; patient will take medication for 1 more day at discharge  3-hypothyroidism: continue synthroid -TSH WNL  4-HTN: overall stable and well controlled -continue previous home antihypertensive regimen and new prescription/use of imdur  5-GERD: continue PPI  6-anxiety: continue PRN xanax  7-diabetes type 2 well controlled -last A1C 6.3 -following just diet controlled   8-hx of constipation: most likely due to hypothyroidism -will discharge on miralax  Procedures:  Left cath: 04/18/14 Vessel - LAD/Segment - mid Percent Stenosis (pre) 100% TIMI-flow 0 POBA of the LAD and second diagonal Percent Stenosis (post) 80%/50% TIMI-flow (post) 2/3  Final Conclusions:  1. Single vessel occlusive CAD 2. Normal LV function 3. Successful POBA of the mid LAD and second diagonal   Consultations:  Cardiology   Discharge Exam: Filed Vitals:   04/20/14 2055  BP: 114/48  Pulse: 60  Temp: 98 F (36.7 C)  Resp: 18    General: Patient without CP. No fever, no cough, no palpitations  Cardiovascular: RRR, no rubs or gallops  Respiratory: CTA bilaterally  Abdomen: soft, NT, ND, positive BS  Musculoskeletal: no edema, no cyanosis   Discharge Instructions   Discharge Instructions    Amb Referral  to Cardiac Rehabilitation    Complete by:  As directed      Diet - low sodium heart healthy    Complete by:  As directed      Discharge  instructions    Complete by:  As directed   Follow with cardiology as scheduled (Dr. Einar Gip on 04/27/14) Arrange hospital follow up visit with PCP in 10 days Follow low sodium diet/heart healthy diet Keep yourself well hydrated Advance activity as tolerated          Current Discharge Medication List    START taking these medications   Details  clopidogrel (PLAVIX) 75 MG tablet Take 1 tablet (75 mg total) by mouth daily with breakfast. Qty: 30 tablet, Refills: 1    isosorbide mononitrate (IMDUR) 60 MG 24 hr tablet Take 1 tablet (60 mg total) by mouth daily. Qty: 30 tablet, Refills: 1    pantoprazole (PROTONIX) 40 MG tablet Take 1 tablet (40 mg total) by mouth daily at 12 noon. Qty: 30 tablet, Refills: 1    polyethylene glycol (MIRALAX / GLYCOLAX) packet Take 17 g by mouth daily. Qty: 30 each, Refills: 0      CONTINUE these medications which have CHANGED   Details  azithromycin (ZITHROMAX Z-PAK) 250 MG tablet 2 tablets X 1 day Qty: 2 tablet, Refills: 0      CONTINUE these medications which have NOT CHANGED   Details  acetaminophen (TYLENOL) 500 MG tablet Take 500 mg by mouth every 6 (six) hours as needed for fever or headache.     ALPRAZolam (XANAX) 0.25 MG tablet Take 0.25 mg by mouth at bedtime as needed for anxiety.     aspirin 81 MG chewable tablet Chew 81 mg by mouth daily.    levothyroxine (SYNTHROID, LEVOTHROID) 150 MCG tablet Take 150 mcg by mouth daily before breakfast.     metoprolol tartrate (LOPRESSOR) 25 MG tablet Take 25 mg by mouth 2 (two) times daily.     niCARdipine (CARDENE) 20 MG capsule Take 20 mg by mouth 2 (two) times daily.     VYTORIN 10-20 MG per tablet Take 1 tablet by mouth daily at 6 PM.     HYDROcodone-acetaminophen (NORCO) 5-325 MG per tablet Take 2 tablets by mouth every 4 (four) hours as needed. Qty: 15 tablet, Refills: 0    traMADol (ULTRAM) 50 MG tablet Take 1 tablet (50 mg total) by mouth every 6 (six) hours as needed. Qty: 15  tablet, Refills: 0       No Known Allergies Follow-up Information    Follow up with Laverda Page, MD On 04/27/2014.   Specialty:  Cardiology   Why:  @ 12:00pm   Contact information:   [address] [place] [address] [phone]       Follow up with ARONSON,RICHARD A, MD. Schedule an appointment as soon as possible for a visit in 10 days.   Specialty:  Internal Medicine   Contact information:   [place]  [address] [phone]       The results of significant diagnostics from this hospitalization (including imaging, microbiology, ancillary and laboratory) are listed below for reference.    Significant Diagnostic Studies: Dg Chest 2 View  04/19/2014   CLINICAL DATA:  Pneumonia.  EXAM: CHEST  2 VIEW  COMPARISON:  April 17, 2014.  FINDINGS: The heart size and mediastinal contours are within normal limits. No pneumothorax or plural effusion is noted. Degenerative changes are noted in lower thoracic spine anteriorly.  Interstitial densities noted on prior exam are not well visualized currently. No acute pulmonary disease is noted.  IMPRESSION: No acute cardiopulmonary abnormality seen.   Electronically Signed   By: Marijo Conception, M.D.   On: 04/19/2014 13:36   Dg Chest 2 View  04/17/2014   CLINICAL DATA:  Short of breath and chest pain for 1 day.  EXAM: CHEST  2 VIEW  COMPARISON:  [facility] dating back to 05/18/2007.  FINDINGS: Cardiopericardial silhouette is within normal limits. Mediastinal contours are normal. Aortic arch atherosclerosis is present. There is a diffuse interstitial pattern, suggesting either atypical infection or interstitial pulmonary edema. No pleural effusions are present. No airspace consolidation.  IMPRESSION: Diffuse nonspecific interstitial pattern, typically associated with either interstitial pulmonary edema or atypical pulmonary infection such as mycoplasma.   Electronically Signed   By: Dereck Ligas M.D.   On: 04/17/2014  20:34    Microbiology: Recent Results (from the past 240 hour(s))  MRSA PCR Screening     Status: None   Collection Time: 04/18/14  7:07 PM  Result Value Ref Range Status   MRSA by PCR NEGATIVE NEGATIVE Final    Comment:        The GeneXpert MRSA Assay (FDA approved for NASAL specimens only), is one component of a comprehensive MRSA colonization surveillance program. It is not intended to diagnose MRSA infection nor to guide or monitor treatment for MRSA infections.      Labs: Basic Metabolic Panel:  Recent Labs Lab 04/17/14 2010 04/19/14 0215 04/21/14 0358  NA 140 138 138  K 4.0 3.6 4.2  CL 107 110 108  CO2 29 19 23   GLUCOSE 108* 101* 118*  BUN 15 8 12   CREATININE 0.85 0.61 0.84  CALCIUM 8.9 8.5 8.9   Liver Function Tests:  Recent Labs Lab 04/17/14 2010  AST 22  ALT 17  ALKPHOS 100  BILITOT 0.3  PROT 6.3  ALBUMIN 3.9   CBC:  Recent Labs Lab 04/17/14 2010 04/18/14 0600 04/19/14 0215 04/20/14 0537 04/21/14 0358  WBC 12.2* 12.7* 11.3* 10.0 11.5*  HGB 14.1 12.8 13.0 12.7 11.6*  HCT 41.9 38.4 38.4 38.5 35.2*  MCV 90.1 89.7 88.9 91.7 89.8  PLT 232 217 200 193 202   Cardiac Enzymes:  Recent Labs Lab 04/18/14 0408 04/18/14 0600 04/18/14 0844 04/20/14 0537  TROPONINI <0.03 0.15* <0.03 0.07*   BNP: BNP (last 3 results)  Recent Labs  04/17/14 2119  BNP 68.0   CBG:  Recent Labs Lab 04/20/14 1146 04/20/14 1627 04/20/14 2057 04/21/14 0629 04/21/14 1101  GLUCAP 126* 100* 159* 113* 94    Signed:  Barton Dubois  [facility] 04/21/2014, 12:48 PM

## 2014-04-21 NOTE — Progress Notes (Signed)
    Subjective:  No more CP but does have aching in her R shoulder on and off that does not sound cardiac.   Objective:  Vital Signs in the last 24 hours: Temp:  [98 F (36.7 C)-98.8 F (37.1 C)] 98 F (36.7 C) (02/11 2055) Pulse Rate:  [54-60] 60 (02/11 2055) Resp:  [16-18] 18 (02/11 2055) BP: (113-130)/(48-52) 114/48 mmHg (02/11 2055) SpO2:  [94 %-95 %] 95 % (02/11 2055)  Intake/Output from previous day: 02/11 0701 - 02/12 0700 In: 1080 [P.O.:1080] Out: -   Physical Exam: Pt is alert and oriented, NAD HEENT: normal Neck: JVP - normal Lungs: CTA bilaterally CV: RRR without murmur or gallop Abd: soft, NT, Positive BS, no hepatomegaly Ext: no C/C/E, distal pulses intact and equal, right radial site clear Skin: warm/dry no rash   Lab Results:  Recent Labs  04/20/14 0537 04/21/14 0358  WBC 10.0 11.5*  HGB 12.7 11.6*  PLT 193 202    Recent Labs  04/19/14 0215 04/21/14 0358  NA 138 138  K 3.6 4.2  CL 110 108  CO2 19 23  GLUCOSE 101* 118*  BUN 8 12  CREATININE 0.61 0.84    Recent Labs  04/20/14 0537  TROPONINI 0.07*   Ecg last pm was normal.  Cardiac Studies: Cardiac Cath: Final Conclusions:  1. Single vessel occlusive CAD 2. Normal LV function 3. Successful POBA of the mid LAD and second diagonal   Recommendations:  Although the final angioplasty result was suboptimal there was restoration of good flow and the patient was pain free. The LAD is very small in caliber and not suitable for stenting. Hopefully with medical therapy the vessel will heal well. I would continue DAPT for a year if tolerated with ASA and Plavix.  Tele: Sinus rhythm  Assessment/Plan:  1. NSTEMI - s/p PTCA, w/ recurrent chest pain yesterday and the night before. Troponin was slightly elevated. It is not surprising that she has recurrent angina. Her distal LAD was small and diffusely diseased. PTCA was suboptimal with POBA but the vessel was not suitable for stenting. She  is not a candidate for any further intervention. She does have some right to left collaterals. We will need to optimize antianginal therapy. She is well beta blocked on metoprolol. Long acting nitrate added yesterday with good control. Further troponins cancelled since this will not alter therapy. Continue DAPT. She can likely go home today with close follow up w/ Dr Einar Gip -- Femoral site stable  2. Hyperlipidemia on Vytorin with good control.  3. DM type 2.  4. HTN controlled.   DISPO- She already has post hospital follow up Dr. Einar Gip on 02/25/15. She wants to follow with him exclusively. She also had an appointment with Cecilie Kicks NP in two weeks which I have canceled.   Eileen Stanford, M.D. 04/21/2014, 10:03 AM    Patient seen and examined and history reviewed. Agree with above findings and plan. Patient is doing much better. Slept well last night after Xanax. No chest pain. She is stable for DC today from our standpoint. Family desires to follow up with Dr. Einar Gip.   Kedarius Aloisi [place], [place] 04/21/2014 10:48 AM

## 2014-04-21 NOTE — Discharge Instructions (Signed)
Call [facility] at [phone] if any bleeding, swelling or drainage at cath site.  May shower, no tub baths for 48 hours for groin sticks. No lifting over 5 pounds for 3 days.  No Driving for 3 days.    Clopidogrel tablets What is this medicine? CLOPIDOGREL (kloh PID oh grel) helps to prevent blood clots. This medicine is used to prevent heart attack, stroke, or other vascular events in people who are at high risk. This medicine may be used for other purposes; ask your health care provider or pharmacist if you have questions. COMMON BRAND NAME(S): Plavix What should I tell my health care provider before I take this medicine? They need to know if you have any of the following conditions: -bleeding disorder -bleeding in the brain -planned surgery -stomach or intestinal ulcers -stroke or transient ischemic attack -an unusual or allergic reaction to clopidogrel, other medicines, foods, dyes, or preservatives -pregnant or trying to get pregnant -breast-feeding How should I use this medicine? Take this medicine by mouth with a drink of water. Follow the directions on the prescription label. You may take this medicine with or without food. Take your medicine at regular intervals. Do not take your medicine more often than directed. Talk to your pediatrician regarding the use of this medicine in children. Special care may be needed. Overdosage: If you think you have taken too much of this medicine contact a poison control center or emergency room at once. NOTE: This medicine is only for you. Do not share this medicine with others. What if I miss a dose? If you miss a dose, take it as soon as you can. If it is almost time for your next dose, take only that dose. Do not take double or extra doses. What may interact with this medicine? -aspirin -blood thinners like cilostazol, enoxaparin, ticlopidine, and warfarin -certain medicines for depression like citalopram, fluoxetine, and  fluvoxamine -certain medicines for fungal infections like ketoconazole, fluconazole, and voriconazole -certain medicines for HIV infection like delavirdine, efavirenz, and etravirine -certain medicines for seizures like felbamate, oxcarbazepine, and phenytoin -chloramphenicol -fluvastatin -isoniazid, INH -medicines for inflammation like ibuprofen and naproxen -modafinil -nicardipine -over-the counter supplements like echinacea, feverfew, fish oil, garlic, ginger, ginkgo, green tea, horse chestnut -quinine -stomach acid blockers like cimetidine, omeprazole, and esomeprazole -tamoxifen -tolbutamide -topiramate -torsemide This list may not describe all possible interactions. Give your health care provider a list of all the medicines, herbs, non-prescription drugs, or dietary supplements you use. Also tell them if you smoke, drink alcohol, or use illegal drugs. Some items may interact with your medicine. What should I watch for while using this medicine? Visit your doctor or health care professional for regular check ups. Do not stop taking your medicine unless your doctor tells you to. Notify your doctor or health care professional and seek emergency treatment if you develop breathing problems; changes in vision; chest pain; severe, sudden headache; pain, swelling, warmth in the leg; trouble speaking; sudden numbness or weakness of the face, arm or leg. These can be signs that your condition has gotten worse. If you are going to have surgery or dental work, tell your doctor or health care professional that you are taking this medicine. Certain genetic factors may reduce the effect of this medicine. Your doctor may use genetic tests to determine treatment. What side effects may I notice from receiving this medicine? Side effects that you should report to your doctor or health care professional as soon as possible: -allergic reactions  like skin rash, itching or hives, swelling of the face, lips,  or tongue -breathing problems -changes in vision -fever -signs and symptoms of bleeding such as bloody or black, tarry stools; red or dark-brown urine; spitting up blood or brown material that looks like coffee grounds; red spots on the skin; unusual bruising or bleeding from the eye, gums, or nose -sudden weakness -unusual bleeding or bruising Side effects that usually do not require medical attention (report to your doctor or health care professional if they continue or are bothersome): -constipation or diarrhea -headache -pain in back or joints -stomach upset This list may not describe all possible side effects. Call your doctor for medical advice about side effects. You may report side effects to FDA at 1-800-FDA-1088. Where should I keep my medicine? Keep out of the reach of children. Store at room temperature of 59 to 86 degrees F (15 to 30 degrees C). Throw away any unused medicine after the expiration date. NOTE: This sheet is a summary. It may not cover all possible information. If you have questions about this medicine, talk to your doctor, pharmacist, or health care provider.  2015, Elsevier/Gold Standard. (2012-06-22 16:34:37)  Isosorbide Mononitrate extended-release tablets What is this medicine? ISOSORBIDE MONONITRATE (eye soe SOR bide mon oh NYE trate) is a vasodilator. It relaxes blood vessels, increasing the blood and oxygen supply to your heart. This medicine is used to prevent chest pain caused by angina. It will not help to stop an episode of chest pain. This medicine may be used for other purposes; ask your health care provider or pharmacist if you have questions. COMMON BRAND NAME(S): Imdur, Isotrate ER What should I tell my health care provider before I take this medicine? They need to know if you have any of these conditions: -previous heart attack or heart failure -an unusual or allergic reaction to isosorbide mononitrate, nitrates, other medicines, foods, dyes,  or preservatives -pregnant or trying to get pregnant -breast-feeding How should I use this medicine? Take this medicine by mouth with a glass of water. Follow the directions on the prescription label. Do not crush or chew. Take your medicine at regular intervals. Do not take your medicine more often than directed. Do not stop taking this medicine except on the advice of your doctor or health care professional. Talk to your pediatrician regarding the use of this medicine in children. Special care may be needed. Overdosage: If you think you have taken too much of this medicine contact a poison control center or emergency room at once. NOTE: This medicine is only for you. Do not share this medicine with others. What if I miss a dose? If you miss a dose, take it as soon as you can. If it is almost time for your next dose, take only that dose. Do not take double or extra doses. What may interact with this medicine? Do not take this medicine with any of the following medications: -medicines used to treat erectile dysfunction (ED) like avanafil, sildenafil, tadalafil, and vardenafil -riociguat This medicine may also interact with the following medications: -medicines for high blood pressure -other medicines for angina or heart failure This list may not describe all possible interactions. Give your health care provider a list of all the medicines, herbs, non-prescription drugs, or dietary supplements you use. Also tell them if you smoke, drink alcohol, or use illegal drugs. Some items may interact with your medicine. What should I watch for while using this medicine? Check your heart rate and blood  pressure regularly while you are taking this medicine. Ask your doctor or health care professional what your heart rate and blood pressure should be and when you should contact him or her. Tell your doctor or health care professional if you feel your medicine is no longer working. You may get dizzy. Do not  drive, use machinery, or do anything that needs mental alertness until you know how this medicine affects you. To reduce the risk of dizzy or fainting spells, do not sit or stand up quickly, especially if you are an older patient. Alcohol can make you more dizzy, and increase flushing and rapid heartbeats. Avoid alcoholic drinks. Do not treat yourself for coughs, colds, or pain while you are taking this medicine without asking your doctor or health care professional for advice. Some ingredients may increase your blood pressure. What side effects may I notice from receiving this medicine? Side effects that you should report to your doctor or health care professional as soon as possible: -bluish discoloration of lips, fingernails, or palms of hands -irregular heartbeat, palpitations -low blood pressure -nausea, vomiting -persistent headache -unusually weak or tired Side effects that usually do not require medical attention (report to your doctor or health care professional if they continue or are bothersome): -flushing of the face or neck -rash This list may not describe all possible side effects. Call your doctor for medical advice about side effects. You may report side effects to FDA at 1-800-FDA-1088. Where should I keep my medicine? Keep out of the reach of children. Store between 15 and 30 degrees C (59 and 86 degrees F). Keep container tightly closed. Throw away any unused medicine after the expiration date. NOTE: This sheet is a summary. It may not cover all possible information. If you have questions about this medicine, talk to your doctor, pharmacist, or health care provider.  2015, Elsevier/Gold Standard. (2012-12-24 14:48:19)

## 2014-04-26 DIAGNOSIS — F419 Anxiety disorder, unspecified: Secondary | ICD-10-CM | POA: Insufficient documentation

## 2014-04-26 DIAGNOSIS — F329 Major depressive disorder, single episode, unspecified: Secondary | ICD-10-CM | POA: Insufficient documentation

## 2014-05-08 ENCOUNTER — Emergency Department (HOSPITAL_COMMUNITY): Payer: Medicare Other

## 2014-05-08 ENCOUNTER — Observation Stay (HOSPITAL_COMMUNITY)
Admission: EM | Admit: 2014-05-08 | Discharge: 2014-05-10 | Disposition: A | Discharge status: Home / Self Care | Payer: Medicare Other | Attending: Cardiology | Admitting: Cardiology

## 2014-05-08 ENCOUNTER — Encounter (HOSPITAL_COMMUNITY): Payer: Self-pay | Admitting: Emergency Medicine

## 2014-05-08 DIAGNOSIS — R0789 Other chest pain: Secondary | ICD-10-CM | POA: Insufficient documentation

## 2014-05-08 DIAGNOSIS — I10 Essential (primary) hypertension: Secondary | ICD-10-CM | POA: Insufficient documentation

## 2014-05-08 DIAGNOSIS — E119 Type 2 diabetes mellitus without complications: Secondary | ICD-10-CM | POA: Insufficient documentation

## 2014-05-08 DIAGNOSIS — R079 Chest pain, unspecified: Secondary | ICD-10-CM | POA: Diagnosis present

## 2014-05-08 DIAGNOSIS — Z79899 Other long term (current) drug therapy: Secondary | ICD-10-CM | POA: Insufficient documentation

## 2014-05-08 DIAGNOSIS — E785 Hyperlipidemia, unspecified: Secondary | ICD-10-CM | POA: Diagnosis not present

## 2014-05-08 DIAGNOSIS — E039 Hypothyroidism, unspecified: Secondary | ICD-10-CM | POA: Diagnosis not present

## 2014-05-08 DIAGNOSIS — F419 Anxiety disorder, unspecified: Secondary | ICD-10-CM | POA: Diagnosis not present

## 2014-05-08 DIAGNOSIS — I341 Nonrheumatic mitral (valve) prolapse: Secondary | ICD-10-CM | POA: Diagnosis not present

## 2014-05-08 DIAGNOSIS — M810 Age-related osteoporosis without current pathological fracture: Secondary | ICD-10-CM | POA: Diagnosis not present

## 2014-05-08 DIAGNOSIS — Z87891 Personal history of nicotine dependence: Secondary | ICD-10-CM | POA: Diagnosis not present

## 2014-05-08 DIAGNOSIS — Z86010 Personal history of colonic polyps: Secondary | ICD-10-CM | POA: Diagnosis not present

## 2014-05-08 DIAGNOSIS — I25110 Atherosclerotic heart disease of native coronary artery with unstable angina pectoris: Principal | ICD-10-CM | POA: Insufficient documentation

## 2014-05-08 DIAGNOSIS — Z9861 Coronary angioplasty status: Secondary | ICD-10-CM | POA: Insufficient documentation

## 2014-05-08 DIAGNOSIS — Z7982 Long term (current) use of aspirin: Secondary | ICD-10-CM | POA: Insufficient documentation

## 2014-05-08 DIAGNOSIS — I200 Unstable angina: Secondary | ICD-10-CM | POA: Diagnosis present

## 2014-05-08 DIAGNOSIS — F329 Major depressive disorder, single episode, unspecified: Secondary | ICD-10-CM | POA: Insufficient documentation

## 2014-05-08 DIAGNOSIS — I252 Old myocardial infarction: Secondary | ICD-10-CM | POA: Insufficient documentation

## 2014-05-08 HISTORY — DX: Acute myocardial infarction, unspecified: I21.9

## 2014-05-08 HISTORY — DX: Unstable angina: I20.0

## 2014-05-08 LAB — BASIC METABOLIC PANEL
Anion gap: 6 (ref 5–15)
BUN: 14 mg/dL (ref 6–23)
CO2: 23 mmol/L (ref 19–32)
Calcium: 9.2 mg/dL (ref 8.4–10.5)
Chloride: 109 mmol/L (ref 96–112)
Creatinine, Ser: 0.77 mg/dL (ref 0.50–1.10)
GFR calc Af Amer: >90 mL/min (ref >90)
GFR calc non Af Amer: 81 mL/min — ABNORMAL LOW (ref >90)
Glucose, Bld: 81 mg/dL (ref 70–99)
Potassium: 4 mmol/L (ref 3.5–5.1)
Sodium: 138 mmol/L (ref 135–145)

## 2014-05-08 LAB — BRAIN NATRIURETIC PEPTIDE: B Natriuretic Peptide: 50 pg/mL (ref 0.0–100.0)

## 2014-05-08 LAB — CBC
HCT: 39.5 % (ref 36.0–46.0)
Hemoglobin: 13.6 g/dL (ref 12.0–15.0)
MCH: 29.9 pg (ref 26.0–34.0)
MCHC: 34.4 g/dL (ref 30.0–36.0)
MCV: 86.8 fL (ref 78.0–100.0)
Platelets: 248 10*3/uL (ref 150–400)
RBC: 4.55 MIL/uL (ref 3.87–5.11)
RDW: 13.3 % (ref 11.5–15.5)
WBC: 8.7 10*3/uL (ref 4.0–10.5)

## 2014-05-08 LAB — I-STAT TROPONIN, ED: Troponin i, poc: 0 ng/mL (ref 0.00–0.08)

## 2014-05-08 LAB — MRSA PCR SCREENING: MRSA by PCR: NEGATIVE

## 2014-05-08 MED ORDER — ASPIRIN 81 MG PO CHEW
324.0000 mg | CHEWABLE_TABLET | Freq: Once | ORAL | Status: AC
  Administered 2014-05-08: 324 mg via ORAL
  Filled 2014-05-08: qty 4

## 2014-05-08 MED ORDER — CLOPIDOGREL BISULFATE 75 MG PO TABS
75.0000 mg | ORAL_TABLET | Freq: Every day | ORAL | Status: DC
  Administered 2014-05-09 – 2014-05-10 (×2): 75 mg via ORAL
  Filled 2014-05-08 (×2): qty 1

## 2014-05-08 MED ORDER — ACETAMINOPHEN 325 MG PO TABS: 650.0000 mg | ORAL_TABLET | ORAL | Status: DC | PRN

## 2014-05-08 MED ORDER — TRAMADOL HCL 50 MG PO TABS: 50.0000 mg | ORAL_TABLET | Freq: Four times a day (QID) | ORAL | Status: DC | PRN

## 2014-05-08 MED ORDER — NITROGLYCERIN 2 % TD OINT
1.0000 [in_us] | TOPICAL_OINTMENT | Freq: Three times a day (TID) | TRANSDERMAL | Status: DC
  Administered 2014-05-08 – 2014-05-10 (×6): 1 [in_us] via TOPICAL
  Filled 2014-05-08: qty 30

## 2014-05-08 MED ORDER — LEVOTHYROXINE SODIUM 150 MCG PO TABS
150.0000 ug | ORAL_TABLET | Freq: Every day | ORAL | Status: DC
  Administered 2014-05-09 – 2014-05-10 (×2): 150 ug via ORAL
  Filled 2014-05-08 (×3): qty 1

## 2014-05-08 MED ORDER — NICARDIPINE HCL 20 MG PO CAPS
20.0000 mg | ORAL_CAPSULE | Freq: Two times a day (BID) | ORAL | Status: DC
  Administered 2014-05-08 – 2014-05-10 (×4): 20 mg via ORAL
  Filled 2014-05-08 (×5): qty 1

## 2014-05-08 MED ORDER — MORPHINE SULFATE 2 MG/ML IJ SOLN
2.0000 mg | Freq: Once | INTRAMUSCULAR | Status: AC
  Administered 2014-05-08: 2 mg via INTRAVENOUS
  Filled 2014-05-08: qty 1

## 2014-05-08 MED ORDER — POLYETHYLENE GLYCOL 3350 17 G PO PACK
17.0000 g | PACK | Freq: Every day | ORAL | Status: DC
  Filled 2014-05-08 (×3): qty 1

## 2014-05-08 MED ORDER — NITROGLYCERIN 0.4 MG SL SUBL
0.4000 mg | SUBLINGUAL_TABLET | SUBLINGUAL | Status: DC | PRN
  Administered 2014-05-08 – 2014-05-09 (×3): 0.4 mg via SUBLINGUAL
  Filled 2014-05-08 (×3): qty 1

## 2014-05-08 MED ORDER — HYDROCODONE-ACETAMINOPHEN 5-325 MG PO TABS: 2.0000 | ORAL_TABLET | ORAL | Status: DC | PRN

## 2014-05-08 MED ORDER — HEPARIN (PORCINE) IN NACL 100-0.45 UNIT/ML-% IJ SOLN
900.0000 [IU]/h | INTRAMUSCULAR | Status: DC
  Administered 2014-05-08 – 2014-05-09 (×2): 900 [IU]/h via INTRAVENOUS
  Filled 2014-05-08 (×2): qty 250

## 2014-05-08 MED ORDER — METOPROLOL TARTRATE 25 MG PO TABS
25.0000 mg | ORAL_TABLET | Freq: Two times a day (BID) | ORAL | Status: DC
  Filled 2014-05-08: qty 1

## 2014-05-08 MED ORDER — ZOLPIDEM TARTRATE 5 MG PO TABS: 5.0000 mg | ORAL_TABLET | Freq: Every evening | ORAL | Status: DC | PRN

## 2014-05-08 MED ORDER — ALPRAZOLAM 0.25 MG PO TABS: 0.2500 mg | ORAL_TABLET | Freq: Every evening | ORAL | Status: DC | PRN

## 2014-05-08 MED ORDER — SODIUM CHLORIDE 0.9 % IJ SOLN
3.0000 mL | Freq: Two times a day (BID) | INTRAMUSCULAR | Status: DC
  Administered 2014-05-09 – 2014-05-10 (×2): 3 mL via INTRAVENOUS

## 2014-05-08 MED ORDER — METOPROLOL TARTRATE 12.5 MG HALF TABLET
12.5000 mg | ORAL_TABLET | Freq: Two times a day (BID) | ORAL | Status: DC
  Administered 2014-05-08 – 2014-05-10 (×4): 12.5 mg via ORAL
  Filled 2014-05-08 (×5): qty 1

## 2014-05-08 MED ORDER — SODIUM CHLORIDE 0.9 % IV SOLN: 250.0000 mL | INTRAVENOUS | Status: DC | PRN

## 2014-05-08 MED ORDER — SODIUM CHLORIDE 0.9 % IJ SOLN: 3.0000 mL | INTRAMUSCULAR | Status: DC | PRN

## 2014-05-08 MED ORDER — ASPIRIN EC 81 MG PO TBEC
81.0000 mg | DELAYED_RELEASE_TABLET | Freq: Every day | ORAL | Status: DC
  Administered 2014-05-09 – 2014-05-10 (×2): 81 mg via ORAL
  Filled 2014-05-08 (×2): qty 1

## 2014-05-08 MED ORDER — ISOSORBIDE MONONITRATE ER 60 MG PO TB24
60.0000 mg | ORAL_TABLET | Freq: Every day | ORAL | Status: DC
  Administered 2014-05-08 – 2014-05-09 (×2): 60 mg via ORAL
  Filled 2014-05-08 (×2): qty 1

## 2014-05-08 MED ORDER — ONDANSETRON HCL 4 MG/2ML IJ SOLN
4.0000 mg | Freq: Four times a day (QID) | INTRAMUSCULAR | Status: DC | PRN
  Administered 2014-05-10: 4 mg via INTRAVENOUS
  Filled 2014-05-08: qty 2

## 2014-05-08 MED ORDER — EZETIMIBE-SIMVASTATIN 10-20 MG PO TABS
1.0000 | ORAL_TABLET | Freq: Every day | ORAL | Status: DC
  Administered 2014-05-09: 1 via ORAL
  Filled 2014-05-08 (×2): qty 1

## 2014-05-08 MED ORDER — ASPIRIN 300 MG RE SUPP
300.0000 mg | RECTAL | Status: AC
  Filled 2014-05-08: qty 1

## 2014-05-08 MED ORDER — ASPIRIN 81 MG PO CHEW
324.0000 mg | CHEWABLE_TABLET | ORAL | Status: AC
  Administered 2014-05-08: 324 mg via ORAL
  Filled 2014-05-08: qty 4

## 2014-05-08 MED ORDER — SODIUM CHLORIDE 0.9 % IV BOLUS (SEPSIS)
500.0000 mL | Freq: Once | INTRAVENOUS | Status: AC
  Administered 2014-05-08: 500 mL via INTRAVENOUS

## 2014-05-08 MED ORDER — PANTOPRAZOLE SODIUM 40 MG PO TBEC
40.0000 mg | DELAYED_RELEASE_TABLET | Freq: Every day | ORAL | Status: DC
  Administered 2014-05-08 – 2014-05-10 (×3): 40 mg via ORAL
  Filled 2014-05-08 (×3): qty 1

## 2014-05-08 MED ORDER — HEPARIN BOLUS VIA INFUSION
4000.0000 [IU] | Freq: Once | INTRAVENOUS | Status: AC
  Administered 2014-05-08: 4000 [IU] via INTRAVENOUS
  Filled 2014-05-08: qty 4000

## 2014-05-08 MED ORDER — ACETAMINOPHEN 500 MG PO TABS: 500.0000 mg | ORAL_TABLET | Freq: Four times a day (QID) | ORAL | Status: DC | PRN

## 2014-05-08 NOTE — ED Notes (Signed)
Spoke to Dr. Einar Gip and Dr. Christy Gentles.  Ordered placed for stepdown as 2W unable to take pt with active chest pain.

## 2014-05-08 NOTE — ED Notes (Signed)
Attempted report x1. 

## 2014-05-08 NOTE — ED Notes (Signed)
Spoke to flow about new pt placement order

## 2014-05-08 NOTE — ED Provider Notes (Signed)
CSN: [id number]     Arrival date & time 05/08/14  1646 History   First MD Initiated Contact with Patient 05/08/14 1826     Chief Complaint  Patient presents with  . Chest Pain     (Consider location/radiation/quality/duration/timing/severity/associated sxs/prior Treatment) Patient is a 75 y.o. female presenting with chest pain. The history is provided by the patient. No language interpreter was used.  Chest Pain Pain location:  L chest Pain quality: aching   Pain radiates to:  Neck, L arm and L jaw Pain radiates to the back: no   Pain severity:  Moderate Onset quality:  Sudden Duration:  3 hours Timing:  Constant Progression:  Unchanged Chronicity:  Recurrent Context: at rest   Context: not breathing, not eating, not lifting, no movement, not raising an arm, no stress and no trauma   Relieved by:  Nothing Worsened by:  Exertion Ineffective treatments:  Nitroglycerin and rest Associated symptoms: diaphoresis, fatigue and shortness of breath   Associated symptoms: no abdominal pain, no back pain, no claudication, no cough, no dizziness, no fever, no headache, no lower extremity edema, no nausea, no near-syncope, no numbness, no orthopnea, no palpitations, no syncope, not vomiting and no weakness   Fatigue:    Severity:  Mild   Duration:  3 hours   Timing:  Constant   Progression:  Unchanged Shortness of breath:    Severity:  Mild   Onset quality:  Sudden   Duration:  3 hours   Timing:  Constant   Progression:  Unchanged Risk factors: coronary artery disease, diabetes mellitus, high cholesterol and hypertension   Risk factors: no aortic disease, no birth control, no immobilization, not obese, not pregnant, no prior DVT/PE, no smoking and no surgery     Past Medical History  Diagnosis Date  . Mitral valve prolapse   . Thyroid disease     hypothyroidism  . Diabetes mellitus     type II  . Anxiety   . Spinal stenosis   . Venous insufficiency   . Osteoarthritis   .  Hypertension   . Hyperlipidemia   . History of colon polyps   . Blood transfusion   . Heart murmur   . Osteoporosis   . MI (myocardial infarction)    Past Surgical History  Procedure Laterality Date  . Tonsillectomy  1955  . Tubal ligation  07/08/74  . Dilation and curettage of uterus  09/1976  . Appendectomy  02/14/1978  . Oophorectomy  02/14/1978  . Basal cell carcinoma excision  01/26/1990  . Colonoscopy  1993, 2003, 2008, 2012  . Squamous cell carcinoma excision    . Abdominal hysterectomy  11/26/1976  . Shoulder surgery  10/18/2007    basal cell  . Mass excision  10/20/2011    Procedure: MINOR EXCISION OF MASS;  Surgeon: Adin Hector, MD;  Location: [facility];  Service: General;  Laterality: N/A;  Excision mass posterior neck  . Left heart catheterization with coronary angiogram N/A 04/18/2014    Procedure: LEFT HEART CATHETERIZATION WITH CORONARY ANGIOGRAM;  Surgeon: Peter M Martinique, MD;  Location: [facility] CATH LAB;  Service: Cardiovascular;  Laterality: N/A;   Family History  Problem Relation Age of Onset  . Heart disease Father   . Cancer Sister     breast  . Cancer Daughter     cervical and bone   History  Substance Use Topics  . Smoking status: Former Smoker    Quit date: 03/10/2005  . Smokeless  tobacco: Never Used  . Alcohol Use: No   OB History    No data available     Review of Systems  Constitutional: Positive for diaphoresis and fatigue. Negative for fever.  Respiratory: Positive for shortness of breath. Negative for cough.   Cardiovascular: Positive for chest pain. Negative for palpitations, orthopnea, claudication, syncope and near-syncope.  Gastrointestinal: Negative for nausea, vomiting and abdominal pain.  Genitourinary: Negative for dysuria.  Musculoskeletal: Negative for back pain.  Neurological: Negative for dizziness, weakness, light-headedness, numbness and headaches.  All other systems reviewed and are negative.     Allergies   Review of patient's allergies indicates no known allergies.  Home Medications   Prior to Admission medications   Medication Sig Start Date End Date Taking? Authorizing Provider  acetaminophen (TYLENOL) 500 MG tablet Take 500 mg by mouth every 6 (six) hours as needed for fever or headache.     Historical Provider, MD  ALPRAZolam Duanne Moron) 0.25 MG tablet Take 0.25 mg by mouth at bedtime as needed for anxiety.     Historical Provider, MD  aspirin 81 MG chewable tablet Chew 81 mg by mouth daily.    Historical Provider, MD  azithromycin (ZITHROMAX Z-PAK) 250 MG tablet 2 tablets X 1 day 04/21/14   Barton Dubois, MD  clopidogrel (PLAVIX) 75 MG tablet Take 1 tablet (75 mg total) by mouth daily with breakfast. 04/21/14   Barton Dubois, MD  HYDROcodone-acetaminophen [facility]) 5-325 MG per tablet Take 2 tablets by mouth every 4 (four) hours as needed. Patient not taking: Reported on 04/17/2014 06/18/13   Veryl Speak, MD  isosorbide mononitrate (IMDUR) 60 MG 24 hr tablet Take 1 tablet (60 mg total) by mouth daily. 04/21/14   Barton Dubois, MD  levothyroxine (SYNTHROID, LEVOTHROID) 150 MCG tablet Take 150 mcg by mouth daily before breakfast.  08/25/11   Historical Provider, MD  metoprolol tartrate (LOPRESSOR) 25 MG tablet Take 25 mg by mouth 2 (two) times daily.  09/12/11   Historical Provider, MD  niCARdipine (CARDENE) 20 MG capsule Take 20 mg by mouth 2 (two) times daily.  09/12/11   Historical Provider, MD  nitroGLYCERIN (NITROSTAT) 0.4 MG SL tablet Place 1 tablet (0.4 mg total) under the tongue every 5 (five) minutes as needed for chest pain. 04/21/14   Barton Dubois, MD  pantoprazole (PROTONIX) 40 MG tablet Take 1 tablet (40 mg total) by mouth daily at 12 noon. 04/21/14   Barton Dubois, MD  polyethylene glycol [facility] / GLYCOLAX) packet Take 17 g by mouth daily. 04/21/14   Barton Dubois, MD  traMADol (ULTRAM) 50 MG tablet Take 1 tablet (50 mg total) by mouth every 6 (six) hours as needed. Patient not taking: Reported  on 04/17/2014 06/18/13   Veryl Speak, MD  VYTORIN 10-20 MG per tablet Take 1 tablet by mouth daily at 6 PM.  09/18/11   Historical Provider, MD   BP 120/53 mmHg  Pulse 65  Temp(Src) 97.7 F (36.5 C) (Oral)  Resp 27  Ht 5\' 4"  (1.626 m)  Wt 161 lb (73.029 kg)  BMI 27.62 kg/m2  SpO2 100% Physical Exam  Constitutional: She is oriented to person, place, and time. She appears well-developed and well-nourished.  HENT:  Head: Normocephalic and atraumatic.  Right Ear: External ear normal.  Left Ear: External ear normal.  Mouth/Throat: Oropharynx is clear and moist.  Eyes: Conjunctivae and EOM are normal. Pupils are equal, round, and reactive to light.  Neck: Normal range of motion. Neck supple.  Cardiovascular: Normal  rate, regular rhythm, normal heart sounds and intact distal pulses.   Pulmonary/Chest: Effort normal and breath sounds normal. No respiratory distress. She has no wheezes. She has no rales. She exhibits no tenderness.  Abdominal: Soft. Bowel sounds are normal. She exhibits no distension and no mass. There is no tenderness. There is no rebound and no guarding.  Musculoskeletal: Normal range of motion.  Neurological: She is alert and oriented to person, place, and time.  Skin: Skin is warm and dry.  Nursing note and vitals reviewed.   ED Course  Procedures (including critical care time) Labs Review Labs Reviewed  BASIC METABOLIC PANEL - Abnormal; Notable for the following:    GFR calc non Af Amer 81 (*)    All other components within normal limits  CBC  I-STAT TROPOININ, ED    Imaging Review Dg Chest 2 View  05/08/2014   CLINICAL DATA:  Left chest pain, shortness of breath  EXAM: CHEST  2 VIEW  COMPARISON:  04/19/2014  FINDINGS: Mild left basilar atelectasis. No focal consolidation. No pleural effusion or pneumothorax.  The heart is normal in size.  Degenerative changes of the visualized thoracolumbar spine.  IMPRESSION: No evidence of acute cardiopulmonary disease.    Electronically Signed   By: Julian Hy M.D.   On: 05/08/2014 18:24     EKG Interpretation   Date/Time:  Monday May 08 2014 17:12:19 EST Ventricular Rate:  62 PR Interval:  170 QRS Duration: 96 QT Interval:  446 QTC Calculation: 452 R Axis:   72 Text Interpretation:  Normal sinus rhythm Normal ECG No significant change  since last tracing Confirmed by Christy Gentles  MD, Elenore Rota ([id number]) on 05/08/2014  5:40:58 PM      MDM   Final diagnoses:  Unstable angina   74 yo F hx of CAD (with recent angioplasty LAD, diagonal X 1 week prior), HTN, HLD, DMII, presents with CC chest pain X 3 hours.    Physical exam as above.  VS WNL.  S/p 2 nitroglycerin at home without benefit.  Pt given nitroglycerin and ASA here with benefit of her pain.    EKG appears no significant change from prior.  CXR unremarkable.  Initial troponin negative.  BNP WNL.  CBC and BMP WNL.   I spoke with Dr. Einar Gip, and agrees that pt needs to come in to hospital for further workup and evaluation of CP, given recent angioplasty.  Recommends heparin, which has been ordered.   Admit to Cardiology, Dr. Einar Gip.  Pt and family understands and agrees with plan.   Sinda Du  Discussed with pt Dr. Christy Gentles.     Sinda Du, MD 05/09/14 [place], MD 05/10/14 [phone]

## 2014-05-08 NOTE — ED Notes (Signed)
Pt took 3 nitro at home with some relief of pain and was recommended to come to ED by cardiologist.  Dr. Einar Gip in [place].   Pt sts pain in chest is coming back.

## 2014-05-08 NOTE — ED Notes (Signed)
EDP at bedside  

## 2014-05-08 NOTE — Progress Notes (Signed)
ANTICOAGULATION CONSULT NOTE - Initial Consult  Pharmacy Consult for heparin Indication: chest pain/ACS  No Known Allergies  Patient Measurements: Height: 5\' 4"  (162.6 cm) Weight: 161 lb (73.029 kg) IBW/kg (Calculated) : 54.7 Heparin Dosing Weight: 70 kg  Vital Signs: Temp: 97.7 F (36.5 C) (02/29 1656) Temp Source: Oral (02/29 1656) BP: 118/53 mmHg (02/29 1927) Pulse Rate: 50 (02/29 1927)  Labs:  Recent Labs  05/08/14 1701  HGB 13.6  HCT 39.5  PLT 248  CREATININE 0.77    Estimated Creatinine Clearance: 60.4 mL/min (by C-G formula based on Cr of 0.77).   Medical History: Past Medical History  Diagnosis Date  . Mitral valve prolapse   . Thyroid disease     hypothyroidism  . Diabetes mellitus     type II  . Anxiety   . Spinal stenosis   . Venous insufficiency   . Osteoarthritis   . Hypertension   . Hyperlipidemia   . History of colon polyps   . Blood transfusion   . Heart murmur   . Osteoporosis   . MI (myocardial infarction)     Medications:  See EMR  Assessment: Pt presents with L sided chest pain, with radiation to L arm, neck, jaw. She was admitted in early Feb 2016 for NSTEMI. At that time she had a L heart cath with PCI to LAD and was discharged with DAPT.  Current hgb 13.6, plts 248, SCr <1, eCrCl ~ 60 ml/min.  Goal of Therapy:  Heparin level 0.3-0.7 units/ml Monitor platelets by anticoagulation protocol: Yes   Plan:  Heparin 4000 units x1, then 900 units/hr  Daily HL, CBC  Monitor for s/sx bleeding   Hughes Better, PharmD, BCPS Clinical Pharmacist Pager: [phone] 05/08/2014 7:50 PM

## 2014-05-08 NOTE — ED Notes (Signed)
Pt reports L sided cp that radiates to L arm, neck and jaw since 315 that started while doing laundry. Pt also c/o sob and family stated that she almost passed out coming into ER. Pt had MI 3 weeks ago but unable to stent.

## 2014-05-08 NOTE — ED Notes (Signed)
Spoke to [place] in lab.  Will add BNP to previously sent blood.

## 2014-05-08 NOTE — ED Provider Notes (Signed)
Patient seen/examined in the Emergency Department in conjunction with Resident Physician Provider Justin Mend Patient reports chest pain.  She has h/o CAD Exam : awake/alert, no added lung sound.  No murmurs noted Plan: admit.  Dr Einar Gip aware and he requests heparin for unstable angina (pt with continued CP despite NTG)   Sharyon Cable, MD 05/08/14 [phone]

## 2014-05-09 ENCOUNTER — Encounter (HOSPITAL_COMMUNITY): Payer: Self-pay | Admitting: *Deleted

## 2014-05-09 DIAGNOSIS — I10 Essential (primary) hypertension: Secondary | ICD-10-CM | POA: Diagnosis not present

## 2014-05-09 DIAGNOSIS — I25110 Atherosclerotic heart disease of native coronary artery with unstable angina pectoris: Secondary | ICD-10-CM | POA: Diagnosis not present

## 2014-05-09 DIAGNOSIS — R0789 Other chest pain: Secondary | ICD-10-CM | POA: Diagnosis not present

## 2014-05-09 DIAGNOSIS — I252 Old myocardial infarction: Secondary | ICD-10-CM | POA: Diagnosis not present

## 2014-05-09 LAB — CBC
HCT: 35.7 % — ABNORMAL LOW (ref 36.0–46.0)
Hemoglobin: 12.1 g/dL (ref 12.0–15.0)
MCH: 30 pg (ref 26.0–34.0)
MCHC: 33.9 g/dL (ref 30.0–36.0)
MCV: 88.4 fL (ref 78.0–100.0)
Platelets: 213 10*3/uL (ref 150–400)
RBC: 4.04 MIL/uL (ref 3.87–5.11)
RDW: 13.4 % (ref 11.5–15.5)
WBC: 9.1 10*3/uL (ref 4.0–10.5)

## 2014-05-09 LAB — BASIC METABOLIC PANEL
Anion gap: 8 (ref 5–15)
BUN: 12 mg/dL (ref 6–23)
CO2: 25 mmol/L (ref 19–32)
Calcium: 8.5 mg/dL (ref 8.4–10.5)
Chloride: 108 mmol/L (ref 96–112)
Creatinine, Ser: 0.7 mg/dL (ref 0.50–1.10)
GFR calc Af Amer: >90 mL/min (ref >90)
GFR calc non Af Amer: 83 mL/min — ABNORMAL LOW (ref >90)
Glucose, Bld: 115 mg/dL — ABNORMAL HIGH (ref 70–99)
Potassium: 4.5 mmol/L (ref 3.5–5.1)
Sodium: 141 mmol/L (ref 135–145)

## 2014-05-09 LAB — HEPARIN LEVEL (UNFRACTIONATED)
Heparin Unfractionated: 0.71 IU/mL — ABNORMAL HIGH (ref 0.30–0.70)
Heparin Unfractionated: 0.7 IU/mL (ref 0.30–0.70)

## 2014-05-09 LAB — TROPONIN I
Troponin I: <0.03 ng/mL (ref <0.031)
Troponin I: <0.03 ng/mL (ref <0.031)

## 2014-05-09 MED ORDER — RANOLAZINE ER 500 MG PO TB12
1000.0000 mg | ORAL_TABLET | Freq: Two times a day (BID) | ORAL | Status: DC
  Administered 2014-05-09 – 2014-05-10 (×3): 1000 mg via ORAL
  Filled 2014-05-09 (×4): qty 2

## 2014-05-09 MED ORDER — MORPHINE SULFATE 4 MG/ML IJ SOLN
4.0000 mg | Freq: Once | INTRAMUSCULAR | Status: AC
  Administered 2014-05-09: 4 mg via INTRAVENOUS

## 2014-05-09 MED ORDER — MORPHINE SULFATE 4 MG/ML IJ SOLN
4.0000 mg | INTRAMUSCULAR | Status: DC | PRN
  Administered 2014-05-09: 4 mg via INTRAVENOUS
  Filled 2014-05-09: qty 1

## 2014-05-09 MED ORDER — ESCITALOPRAM OXALATE 10 MG PO TABS
10.0000 mg | ORAL_TABLET | Freq: Every day | ORAL | Status: DC
  Administered 2014-05-09 – 2014-05-10 (×2): 10 mg via ORAL
  Filled 2014-05-09 (×3): qty 1

## 2014-05-09 MED ORDER — MORPHINE SULFATE 4 MG/ML IJ SOLN
INTRAMUSCULAR | Status: AC
  Filled 2014-05-09: qty 1

## 2014-05-09 NOTE — Progress Notes (Signed)
ANTICOAGULATION CONSULT NOTE - Follow Up Consult  Pharmacy Consult for Heparin Indication: chest pain/ACS  Allergies  Allergen Reactions  . Other Anaphylaxis and Cough    fragarances-perfumes  . Dilaudid [Hydromorphone Hcl] Nausea Only    Patient Measurements: Height: 5\' 4"  (162.6 cm) Weight: 161 lb 13.1 oz (73.4 kg) IBW/kg (Calculated) : 54.7  Vital Signs: Temp: 98.4 F (36.9 C) (03/01 1127) Temp Source: Oral (03/01 1127) BP: 125/50 mmHg (03/01 1127) Pulse Rate: 52 (03/01 1127)  Labs:  Recent Labs  05/08/14 1701 05/09/14 0240 05/09/14 0955  HGB 13.6 12.1  --   HCT 39.5 35.7*  --   PLT 248 213  --   HEPARINUNFRC  --  0.71* 0.70  CREATININE 0.77 0.70  --   TROPONINI  --  <0.03 <0.03    Estimated Creatinine Clearance: 60.6 mL/min (by C-G formula based on Cr of 0.7).   Medications:  Prescriptions prior to admission  Medication Sig Dispense Refill Last Dose  . acetaminophen (TYLENOL) 500 MG tablet Take 500 mg by mouth every 6 (six) hours as needed for fever or headache.    unk  . ALPRAZolam (XANAX) 0.25 MG tablet Take 0.25 mg by mouth at bedtime as needed for anxiety.    Past Week at Unknown time  . aspirin 81 MG chewable tablet Chew 81 mg by mouth daily.   05/08/2014 at Unknown time  . clopidogrel (PLAVIX) 75 MG tablet Take 1 tablet (75 mg total) by mouth daily with breakfast. 30 tablet 1 05/08/2014 at Unknown time  . escitalopram (LEXAPRO) 10 MG tablet Take 5 mg by mouth daily.   05/07/2014  . isosorbide mononitrate (IMDUR) 60 MG 24 hr tablet Take 1 tablet (60 mg total) by mouth daily. 30 tablet 1 05/08/2014 at Unknown time  . levothyroxine (SYNTHROID, LEVOTHROID) 150 MCG tablet Take 150 mcg by mouth daily before breakfast.    05/08/2014 at Unknown time  . metoprolol tartrate (LOPRESSOR) 25 MG tablet Take 25 mg by mouth 2 (two) times daily.    05/08/2014 at 0730  . niCARdipine (CARDENE) 20 MG capsule Take 20 mg by mouth 2 (two) times daily.    05/08/2014 at Unknown time   . nitroGLYCERIN (NITROSTAT) 0.4 MG SL tablet Place 1 tablet (0.4 mg total) under the tongue every 5 (five) minutes as needed for chest pain. 15 tablet 2 05/08/2014 at Unknown time  . pantoprazole (PROTONIX) 40 MG tablet Take 1 tablet (40 mg total) by mouth daily at 12 noon. 30 tablet 1 05/07/2014  . ranolazine (RANEXA) 500 MG 12 hr tablet Take 500 mg by mouth 2 (two) times daily.   05/08/2014 at Unknown time  . VYTORIN 10-20 MG per tablet Take 1 tablet by mouth daily at 6 PM.    05/07/2014  . HYDROcodone-acetaminophen (NORCO) 5-325 MG per tablet Take 2 tablets by mouth every 4 (four) hours as needed. (Patient not taking: Reported on 04/17/2014) 15 tablet 0 Not Taking at Unknown time  . polyethylene glycol (MIRALAX / GLYCOLAX) packet Take 17 g by mouth daily. (Patient not taking: Reported on 05/09/2014) 30 each 0 Not Taking at Unknown time  . traMADol (ULTRAM) 50 MG tablet Take 1 tablet (50 mg total) by mouth every 6 (six) hours as needed. (Patient not taking: Reported on 04/17/2014) 15 tablet 0 Not Taking at Unknown time    Assessment: Pt admitted 05/08/2014  with L sided chest pain, with radiation to L arm, neck, jaw. Pharmacy is consulted to dose heparin.  PMH:  in early Feb 2016 for NSTEMI. At that time she had a L heart cath with PCI to LAD and was discharged with DAPT.  Coag: ACS, on heparin at 900 units/hr, CBC stable, no bleeding noted, heparin level at goal.   Goal of Therapy:  Heparin level 0.3-0.7 units/ml Monitor platelets by anticoagulation protocol: Yes   Plan:  1. Continue heparin at current rate 2. Follow up plan for course of therapy. 3. Daily heparin level CBC.  Thank you for allowing pharmacy to be a part of this patients care team.  Rowe Robert Pharm.D., BCPS, AQ-Cardiology Clinical Pharmacist 05/09/2014 11:36 AM Pager: [phone] Phone: [phone]

## 2014-05-09 NOTE — Progress Notes (Deleted)
Last dose of anti -rabies vaccine given to right deltoid. Copy of the record faxed to [facility].

## 2014-05-09 NOTE — Progress Notes (Signed)
ANTICOAGULATION CONSULT NOTE - Follow Up Consult  Pharmacy Consult for heparin Indication: chest pain/ACS   Labs:  Recent Labs  05/08/14 1701 05/09/14 0240  HGB 13.6 12.1  HCT 39.5 35.7*  PLT 248 213  HEPARINUNFRC  --  0.71*  CREATININE 0.77 0.70     Assessment/Plan:  74yo female just above goal on heparin with initial dosing for CP after recent stent, lab drawn early and suspect bolus still affecting level. Will continue gtt at current rate and confirm stable with additional level.   Wynona Neat, PharmD, BCPS  05/09/2014,3:51 AM

## 2014-05-09 NOTE — Progress Notes (Signed)
UR completed 

## 2014-05-09 NOTE — Progress Notes (Signed)
Complained of anterior chest pain radiating to her jaw, bp 133/46, nitro sl given with relief.

## 2014-05-09 NOTE — H&P (Signed)
Desiree Leon is an 74 y.o. female.   Chief Complaint: Chest pain HPI: The patient is a 74 year old female who presents to the emergency department last evening for evaluation and management of anginal symptoms. Patient had gone for a walk, went home and did her laundry, started having severe chest tightness with radiation to her left neck, associated with marked diaphoresis. She took 3 sublingual nitroglycerin without any relief. She had called our office stating that she is having ongoing chest pain which was severe and rated as 9-10 out of 10 in intensity. In the emergency room she was given sublingual nitroglycerin with partial relief of pain, morphine sulfate didn't relieve her chest pain. She is now admitted to the hospital for further evaluation and management.  She has  history of hyperlipidemia, diet-controlled diabetes, and hypertension and underwent PTCA and balloon angioplasty following an NSTEMI on 04/18/2014 to a severely diseased very small calibered mid LAD with suboptimal results. The LAD was collateralized from the right coronary artery hence it was left alone.  Patient has history of anxiety and depression, recently lost her husband, takes care of a disabled son at home. 2 daughters are present at the bedside.  Past Medical History  Diagnosis Date  . Mitral valve prolapse   . Thyroid disease     hypothyroidism  . Diabetes mellitus     type II  . Anxiety   . Spinal stenosis   . Venous insufficiency   . Osteoarthritis   . Hypertension   . Hyperlipidemia   . History of colon polyps   . Blood transfusion   . Heart murmur   . Osteoporosis   . MI (myocardial infarction)     Past Surgical History  Procedure Laterality Date  . Tonsillectomy  1955  . Tubal ligation  07/08/74  . Dilation and curettage of uterus  09/1976  . Appendectomy  02/14/1978  . Oophorectomy  02/14/1978  . Basal cell carcinoma excision  01/26/1990  . Colonoscopy  1993, 2003, 2008, 2012  . Squamous  cell carcinoma excision    . Abdominal hysterectomy  11/26/1976  . Shoulder surgery  10/18/2007    basal cell  . Mass excision  10/20/2011    Procedure: MINOR EXCISION OF MASS;  Surgeon: Adin Hector, MD;  Location: [place];  Service: General;  Laterality: N/A;  Excision mass posterior neck  . Left heart catheterization with coronary angiogram N/A 04/18/2014    Procedure: LEFT HEART CATHETERIZATION WITH CORONARY ANGIOGRAM;  Surgeon: Peter M Martinique, MD;  Location: [facility] CATH LAB;  Service: Cardiovascular;  Laterality: N/A;    Family History  Problem Relation Age of Onset  . Heart disease Father   . Cancer Sister     breast  . Cancer Daughter     cervical and bone   Social History:  reports that she quit smoking about 9 years ago. She has never used smokeless tobacco. She reports that she does not drink alcohol or use illicit drugs.  Allergies:  Allergies  Allergen Reactions  . Other Anaphylaxis and Cough    fragarances-perfumes  . Dilaudid [Hydromorphone Hcl] Nausea Only    Medications Prior to Admission  Medication Sig Dispense Refill  . acetaminophen (TYLENOL) 500 MG tablet Take 500 mg by mouth every 6 (six) hours as needed for fever or headache.     . ALPRAZolam (XANAX) 0.25 MG tablet Take 0.25 mg by mouth at bedtime as needed for anxiety.     Marland Kitchen  aspirin 81 MG chewable tablet Chew 81 mg by mouth daily.    . clopidogrel (PLAVIX) 75 MG tablet Take 1 tablet (75 mg total) by mouth daily with breakfast. 30 tablet 1  . escitalopram (LEXAPRO) 10 MG tablet Take 5 mg by mouth daily.    . isosorbide mononitrate (IMDUR) 60 MG 24 hr tablet Take 1 tablet (60 mg total) by mouth daily. 30 tablet 1  . levothyroxine (SYNTHROID, LEVOTHROID) 150 MCG tablet Take 150 mcg by mouth daily before breakfast.     . metoprolol tartrate (LOPRESSOR) 25 MG tablet Take 25 mg by mouth 2 (two) times daily.     Marland Kitchen niCARdipine (CARDENE) 20 MG capsule Take 20 mg by mouth 2 (two) times daily.     .  nitroGLYCERIN (NITROSTAT) 0.4 MG SL tablet Place 1 tablet (0.4 mg total) under the tongue every 5 (five) minutes as needed for chest pain. 15 tablet 2  . pantoprazole (PROTONIX) 40 MG tablet Take 1 tablet (40 mg total) by mouth daily at 12 noon. 30 tablet 1  . ranolazine (RANEXA) 500 MG 12 hr tablet Take 500 mg by mouth 2 (two) times daily.    Marland Kitchen VYTORIN 10-20 MG per tablet Take 1 tablet by mouth daily at 6 PM.     . HYDROcodone-acetaminophen (NORCO) 5-325 MG per tablet Take 2 tablets by mouth every 4 (four) hours as needed. (Patient not taking: Reported on 04/17/2014) 15 tablet 0  . polyethylene glycol (MIRALAX / GLYCOLAX) packet Take 17 g by mouth daily. (Patient not taking: Reported on 05/09/2014) 30 each 0  . traMADol (ULTRAM) 50 MG tablet Take 1 tablet (50 mg total) by mouth every 6 (six) hours as needed. (Patient not taking: Reported on 04/17/2014) 15 tablet 0   Review of systems: General:Present- Fatigue. Not Present- Anorexia and Fever. Respiratory:Present- Decreased Exercise Tolerance and Difficulty Breathing on Exertion. Not Present- Cough. Cardiovascular:Present- Chest Pain. Not Present- Claudications, Edema, Orthopnea and Paroxysmal Nocturnal Dyspnea. Gastrointestinal:Not Present- Change in Bowel Habits, Constipation and Nausea. Neurological:Not Present- Dizziness, Focal Neurological Symptoms and Syncope. Endocrine:Not Present- Appetite Changes, Cold Intolerance and Heat Intolerance. Hematology:Not Present- Anemia, Easy Bruising, Easy Bleeding, Petechiae and Prolonged Bleeding.  Blood pressure 125/50, pulse 52, temperature 98.4 F (36.9 C), temperature source Oral, resp. rate 15, height _0  (1.626 m), weight 73.4 kg (161 lb 13.1 oz), SpO2 92 %. General appearance: alert, cooperative, appears stated age, no distress and mildly obese Eyes: negative findings: lids and lashes normal Neck: no adenopathy, no carotid bruit, no JVD, supple, symmetrical, trachea midline and thyroid not  enlarged, symmetric, no tenderness/mass/nodules Neck: JVP - normal, carotids 2+= without bruits Resp: clear to auscultation bilaterally Chest wall: no tenderness Cardio: regular rate and rhythm, S1, S2 normal, no murmur, click, rub or gallop GI: soft, non-tender; bowel sounds normal; no masses,  no organomegaly Extremities: extremities normal, atraumatic, no cyanosis or edema Pulses: 2+ and symmetric Skin: Skin color, texture, turgor normal. No rashes or lesions Neurologic: Grossly normal  Results for orders placed or performed during the hospital encounter of 05/08/14 (from the past 48 hour(s))  CBC     Status: None   Collection Time: 05/08/14  5:01 PM  Result Value Ref Range   WBC 8.7 4.0 - 10.5 K/uL   RBC 4.55 3.87 - 5.11 MIL/uL   Hemoglobin 13.6 12.0 - 15.0 g/dL   HCT 39.5 36.0 - 46.0 %   MCV 86.8 78.0 - 100.0 fL   MCH 29.9 26.0 - 34.0 pg  MCHC 34.4 30.0 - 36.0 g/dL   RDW 13.3 11.5 - 15.5 %   Platelets 248 150 - 400 K/uL  Basic metabolic panel     Status: Abnormal   Collection Time: 05/08/14  5:01 PM  Result Value Ref Range   Sodium 138 135 - 145 mmol/L   Potassium 4.0 3.5 - 5.1 mmol/L   Chloride 109 96 - 112 mmol/L   CO2 23 19 - 32 mmol/L   Glucose, Bld 81 70 - 99 mg/dL   BUN 14 6 - 23 mg/dL   Creatinine, Ser 0.77 0.50 - 1.10 mg/dL   Calcium 9.2 8.4 - 10.5 mg/dL   GFR calc non Af Amer 81 (L) >90 mL/min   GFR calc Af Amer >90 >90 mL/min    Comment: (NOTE) The eGFR has been calculated using the CKD EPI equation. This calculation has not been validated in all clinical situations. eGFR's persistently <90 mL/min signify possible Chronic Kidney Disease.    Anion gap 6 5 - 15  I-stat troponin, ED (not at [facility])     Status: None   Collection Time: 05/08/14  5:40 PM  Result Value Ref Range   Troponin i, poc 0.00 0.00 - 0.08 ng/mL   Comment 3            Comment: Due to the release kinetics of cTnI, a negative result within the first hours of the onset of symptoms does not  rule out myocardial infarction with certainty. If myocardial infarction is still suspected, repeat the test at appropriate intervals.   Brain natriuretic peptide     Status: None   Collection Time: 05/08/14  6:41 PM  Result Value Ref Range   B Natriuretic Peptide 50.0 0.0 - 100.0 pg/mL  MRSA PCR Screening     Status: None   Collection Time: 05/08/14  9:30 PM  Result Value Ref Range   MRSA by PCR NEGATIVE NEGATIVE    Comment:        The GeneXpert MRSA Assay (FDA approved for NASAL specimens only), is one component of a comprehensive MRSA colonization surveillance program. It is not intended to diagnose MRSA infection nor to guide or monitor treatment for MRSA infections.   Heparin level (unfractionated)     Status: Abnormal   Collection Time: 05/09/14  2:40 AM  Result Value Ref Range   Heparin Unfractionated 0.71 (H) 0.30 - 0.70 IU/mL    Comment:        IF HEPARIN RESULTS ARE BELOW EXPECTED VALUES, AND PATIENT DOSAGE HAS BEEN CONFIRMED, SUGGEST FOLLOW UP TESTING OF ANTITHROMBIN III LEVELS.   CBC     Status: Abnormal   Collection Time: 05/09/14  2:40 AM  Result Value Ref Range   WBC 9.1 4.0 - 10.5 K/uL   RBC 4.04 3.87 - 5.11 MIL/uL   Hemoglobin 12.1 12.0 - 15.0 g/dL   HCT 35.7 (L) 36.0 - 46.0 %   MCV 88.4 78.0 - 100.0 fL   MCH 30.0 26.0 - 34.0 pg   MCHC 33.9 30.0 - 36.0 g/dL   RDW 13.4 11.5 - 15.5 %   Platelets 213 150 - 400 K/uL  Troponin I-(serum)     Status: None   Collection Time: 05/09/14  2:40 AM  Result Value Ref Range   Troponin I <0.03 <0.031 ng/mL    Comment:        NO INDICATION OF MYOCARDIAL INJURY.   Basic metabolic panel     Status: Abnormal   Collection Time: 05/09/14  2:40 AM  Result Value Ref Range   Sodium 141 135 - 145 mmol/L   Potassium 4.5 3.5 - 5.1 mmol/L   Chloride 108 96 - 112 mmol/L   CO2 25 19 - 32 mmol/L   Glucose, Bld 115 (H) 70 - 99 mg/dL   BUN 12 6 - 23 mg/dL   Creatinine, Ser 0.70 0.50 - 1.10 mg/dL   Calcium 8.5 8.4 - 10.5  mg/dL   GFR calc non Af Amer 83 (L) >90 mL/min   GFR calc Af Amer >90 >90 mL/min    Comment: (NOTE) The eGFR has been calculated using the CKD EPI equation. This calculation has not been validated in all clinical situations. eGFR's persistently <90 mL/min signify possible Chronic Kidney Disease.    Anion gap 8 5 - 15  Troponin I-(serum)     Status: None   Collection Time: 05/09/14  9:55 AM  Result Value Ref Range   Troponin I <0.03 <0.031 ng/mL    Comment:        NO INDICATION OF MYOCARDIAL INJURY.   Heparin level (unfractionated)     Status: None   Collection Time: 05/09/14  9:55 AM  Result Value Ref Range   Heparin Unfractionated 0.70 0.30 - 0.70 IU/mL    Comment:        IF HEPARIN RESULTS ARE BELOW EXPECTED VALUES, AND PATIENT DOSAGE HAS BEEN CONFIRMED, SUGGEST FOLLOW UP TESTING OF ANTITHROMBIN III LEVELS.    Dg Chest 2 View  05/08/2014   CLINICAL DATA:  Left chest pain, shortness of breath  EXAM: CHEST  2 VIEW  COMPARISON:  04/19/2014  FINDINGS: Mild left basilar atelectasis. No focal consolidation. No pleural effusion or pneumothorax.  The heart is normal in size.  Degenerative changes of the visualized thoracolumbar spine.  IMPRESSION: No evidence of acute cardiopulmonary disease.   Electronically Signed   By: Julian Hy M.D.   On: 05/08/2014 18:24    Labs:   Lab Results  Component Value Date   WBC 9.1 05/09/2014   HGB 12.1 05/09/2014   HCT 35.7* 05/09/2014   MCV 88.4 05/09/2014   PLT 213 05/09/2014    Recent Labs Lab 05/09/14 0240  NA 141  K 4.5  CL 108  CO2 25  BUN 12  CREATININE 0.70  CALCIUM 8.5  GLUCOSE 115*   Lab Results  Component Value Date   CKTOTAL 101 05/19/2007   CKMB 2.4 05/19/2007   TROPONINI <0.03 05/09/2014    Lipid Panel     Component Value Date/Time   CHOL 125 04/18/2014 0600   TRIG 65 04/18/2014 0600   HDL 38* 04/18/2014 0600   CHOLHDL 3.3 04/18/2014 0600   VLDL 13 04/18/2014 0600   LDLCALC 74 04/18/2014 0600     EKG: Sinus bradycardia at a rate of 49 bpm, otherwise normal EKG. No evidence of ischemia. No change when compared to 05/08/2014 and 05/09/2014. Out patient Echo- 05/03/14 1. Left ventricle cavity is normal in size. Mild concentric hypertrophy of the left ventricle. Normal global wall motion. Calculated EF 63%. 2. Mild mitral regurgitation. 3. Mild tricuspid regurgitation. No evidence of pulmonary hypertension  Assessment/Plan 1. Chest pain suggestive of unstable angina pectoris 2. CAD of the native vessel, Coronary angiogram 04/18/2014: PTCA and balloon angioplasty of mid LAD and D2. Residual 80% stenosis in the distal LAD not amenable to intervention. LAD collateralized by RCA. The native LAD is severely diffusely diseased and very small. 3. Hypertension 4. Diet-controlled diabetes mellitus 5. Hyperlipidemia 6.  Anxiety and depression, patient also grieving recent loss of her husband, takes care of a disabled son at home.  Recommendation: In spite of severe chest pain that has lasted hours, patient has had no EKG abnormalities, cardiac markers are negative myocardial injury. Her symptoms of chest pain with radiation to the neck are clearly suggestive of unstable angina pectoris, however noncardiac etiology cannot be completely excluded. Add a long discussion with the patient and her 2 daughters at the bedside and explained to them regarding life altering angina pectoris but not life-threatening. The LAD is collateralized and is very small. I would continue medical therapy for now. We will initiate cardiac rehabilitation. I will increase Ranexa to 1000 mg by mouth twice a day, change her Imdur to Nitropaste.  Laverda Page, MD 05/09/2014, 12:12 PM [facility] [place] Pager: [phone] Office: [phone] If no answer: Cell:  [phone]

## 2014-05-09 NOTE — Progress Notes (Signed)
Complained of another chest pain getting worst as claimed bp 131/43, hr-60, r-16 . MD made aware, with order morphine 4mg  iv and nitro sl given. Relieved after 30 min . Continue to monitor.

## 2014-05-09 NOTE — Progress Notes (Signed)
CARDIAC REHAB PHASE I   PRE:  Rate/Rhythm: 98 SB  BP:  Supine: 121/40  Sitting:  Standing:    SaO2: 95 RA  MODE:  Ambulation: 350 ft   POST:  Rate/Rhythm: 64 SR  BP:  Supine:   Sitting: 131/42  Standing:    SaO2: 100 RA 1315-1400 On arrival Pt in bed states that she is having pain. She describes it as aching in her left jaw and into left side of chest. Pt gives pain 6 on pain scale. Assisted X 1 to ambulate. Gait steady. She was able to walk 350 feet, pain unchanged with walking.VS stable. Pt to recliner after walk with call light in reach. She is frustrated and just wants to feel good again and be able to do things at home, emotional support provided.   Rodney Langton RN 05/09/2014 1:58 PM

## 2014-05-10 DIAGNOSIS — I252 Old myocardial infarction: Secondary | ICD-10-CM | POA: Diagnosis not present

## 2014-05-10 DIAGNOSIS — I10 Essential (primary) hypertension: Secondary | ICD-10-CM | POA: Diagnosis not present

## 2014-05-10 DIAGNOSIS — R0789 Other chest pain: Secondary | ICD-10-CM | POA: Diagnosis not present

## 2014-05-10 DIAGNOSIS — I25110 Atherosclerotic heart disease of native coronary artery with unstable angina pectoris: Secondary | ICD-10-CM | POA: Diagnosis not present

## 2014-05-10 LAB — CBC
HCT: 35.5 % — ABNORMAL LOW (ref 36.0–46.0)
Hemoglobin: 11.8 g/dL — ABNORMAL LOW (ref 12.0–15.0)
MCH: 30 pg (ref 26.0–34.0)
MCHC: 33.2 g/dL (ref 30.0–36.0)
MCV: 90.3 fL (ref 78.0–100.0)
Platelets: 210 10*3/uL (ref 150–400)
RBC: 3.93 MIL/uL (ref 3.87–5.11)
RDW: 13.5 % (ref 11.5–15.5)
WBC: 7.3 10*3/uL (ref 4.0–10.5)

## 2014-05-10 LAB — HEPARIN LEVEL (UNFRACTIONATED): Heparin Unfractionated: 0.44 IU/mL (ref 0.30–0.70)

## 2014-05-10 LAB — GLUCOSE, CAPILLARY: Glucose-Capillary: 72 mg/dL (ref 70–99)

## 2014-05-10 MED ORDER — RANOLAZINE ER 1000 MG PO TB12: 1000.0000 mg | ORAL_TABLET | Freq: Two times a day (BID) | ORAL | Status: DC

## 2014-05-10 MED ORDER — NITROGLYCERIN 2 % TD OINT: 1.0000 [in_us] | TOPICAL_OINTMENT | Freq: Three times a day (TID) | TRANSDERMAL | Status: DC

## 2014-05-10 NOTE — Progress Notes (Signed)
1400 Offered to walk with pt. Stated going home. Will save energy for going home and hold walk per pt request. Graylon Good RN BSN 05/10/2014 2:08 PM

## 2014-05-10 NOTE — Discharge Summary (Signed)
Physician Discharge Summary  Patient ID: Desiree Leon MRN: [id number] DOB/AGE: 08/09/1940 74 y.o.  Admit date: 05/08/2014 Discharge date: 05/10/2014  Primary Discharge Diagnosis Chest pain and unstable angina pectoris CAD of the native vessels Secondary Discharge Diagnosis 3. Hypertension 4. Diet-controlled diabetes mellitus 5. Hyperlipidemia 6. Anxiety and depression, patient also grieving recent loss of her husband, takes care of a disabled son at home.  Significant Diagnostic Studies: None  Hospital Course:  The patient is a 74 year old female who presents to the emergency department on 05/08/2014 evening for evaluation and management of anginal symptoms. Patient had gone for a walk, went home and did her laundry, started having severe chest tightness with radiation to her left neck, associated with marked diaphoresis. She took 3 sublingual nitroglycerin without any relief. She had called our office stating that she is having ongoing chest pain which was severe and rated as 9-10 out of 10 in intensity. In the emergency room she was given sublingual nitroglycerin with partial relief of pain, morphine sulfate didn't relieve her chest pain. She is now admitted to the hospital for further evaluation and management.  She has history of hyperlipidemia, diet-controlled diabetes, and hypertension and underwent PTCA and balloon angioplasty following an NSTEMI on 04/18/2014 to a severely diseased very small calibered mid LAD with suboptimal results. The LAD was collateralized from the right coronary artery hence it was left alone.  Patient has history of anxiety and depression, recently lost her husband, takes care of a disabled son at home.  In spite of severe chest pain, lasting greater than 30 minutes, cardiac markers were negative for myocardial ischemia/infarct, and there were no EKG changes.  I met with the family and explained in detail regarding this and also discussed the angiograms, patient is  severely diseased LAD and has collaterals from the RCA.  Normal LVEF by echocardiogram that was performed recently in the out patient basis.she was started on IV heparin on admission to the hospital and serial cardiac markers were formed.  On the day of discharge, heparin was discontinued and patient was made to walk and ambulate in the hallway and did well.  Hence felt stable for discharge. Recommendations on discharge: I have increased the dose of Ranexa from 500 mg to 1000 mg by mouth twice a day and changed Imdur to nitroglycerin paste in the hopes of improving her anginal symptoms.  If he continues to have persistent symptoms then I'll consider repeating coronary angiography  I have discussed with her regarding her stress in life and she has fairly supportive daughters and to discuss with them regarding the home situation.  Discharge Exam: Blood pressure 114/37, pulse 57, temperature 98.4 F (36.9 C), temperature source Oral, resp. rate 13, height '5\' 4"'  (1.626 m), weight 73.4 kg (161 lb 13.1 oz), SpO2 96 %.   General appearance: alert, cooperative, appears stated age, no distress and mildly obese Eyes: negative findings: lids and lashes normal Neck: no adenopathy, no carotid bruit, no JVD, supple, symmetrical, trachea midline and thyroid not enlarged, symmetric, no tenderness/mass/nodules Neck: JVP - normal, carotids 2+= without bruits Resp: clear to auscultation bilaterally Chest wall: no tenderness Cardio: regular rate and rhythm, S1, S2 normal, no murmur, click, rub or gallop GI: soft, non-tender; bowel sounds normal; no masses, no organomegaly Extremities: extremities normal, atraumatic, no cyanosis or edema Pulses: 2+ and symmetric Skin: Skin color, texture, turgor normal. No rashes or lesions Neurologic: Grossly normal Labs:   Lab Results  Component Value Date  WBC 7.3 05/10/2014   HGB 11.8* 05/10/2014   HCT 35.5* 05/10/2014   MCV 90.3 05/10/2014   PLT 210 05/10/2014     Recent Labs Lab 05/09/14 0240  NA 141  K 4.5  CL 108  CO2 25  BUN 12  CREATININE 0.70  CALCIUM 8.5  GLUCOSE 115*   Lab Results  Component Value Date   CKTOTAL 101 05/19/2007   CKMB 2.4 05/19/2007   TROPONINI <0.03 05/09/2014    Lipid Panel     Component Value Date/Time   CHOL 125 04/18/2014 0600   TRIG 65 04/18/2014 0600   HDL 38* 04/18/2014 0600   CHOLHDL 3.3 04/18/2014 0600   VLDL 13 04/18/2014 0600   LDLCALC 74 04/18/2014 0600     EKG: Sinus bradycardia at a rate of 49 bpm, otherwise normal EKG. No evidence of ischemia. No change when compared to 05/08/2014 and 05/09/2014.  Out patient Echo- 05/03/14 1. Left ventricle cavity is normal in size. Mild concentric hypertrophy of the left ventricle. Normal global wall motion. Calculated EF 63%. 2. Mild mitral regurgitation. 3. Mild tricuspid regurgitation. No evidence of pulmonary hypertension   Radiology: Dg Chest 2 View  05/08/2014   CLINICAL DATA:  Left chest pain, shortness of breath  EXAM: CHEST  2 VIEW  COMPARISON:  04/19/2014  FINDINGS: Mild left basilar atelectasis. No focal consolidation. No pleural effusion or pneumothorax.  The heart is normal in size.  Degenerative changes of the visualized thoracolumbar spine.  IMPRESSION: No evidence of acute cardiopulmonary disease.   Electronically Signed   By: Julian Hy M.D.   On: 05/08/2014 18:24   Dg Chest 2 View  04/19/2014   CLINICAL DATA:  Pneumonia.  EXAM: CHEST  2 VIEW  COMPARISON:  April 17, 2014.  FINDINGS: The heart size and mediastinal contours are within normal limits. No pneumothorax or plural effusion is noted. Degenerative changes are noted in lower thoracic spine anteriorly. Interstitial densities noted on prior exam are not well visualized currently. No acute pulmonary disease is noted.  IMPRESSION: No acute cardiopulmonary abnormality seen.   Electronically Signed   By: Marijo Conception, M.D.   On: 04/19/2014 13:36   Dg Chest 2 View  04/17/2014    CLINICAL DATA:  Short of breath and chest pain for 1 day.  EXAM: CHEST  2 VIEW  COMPARISON:  [facility] dating back to 05/18/2007.  FINDINGS: Cardiopericardial silhouette is within normal limits. Mediastinal contours are normal. Aortic arch atherosclerosis is present. There is a diffuse interstitial pattern, suggesting either atypical infection or interstitial pulmonary edema. No pleural effusions are present. No airspace consolidation.  IMPRESSION: Diffuse nonspecific interstitial pattern, typically associated with either interstitial pulmonary edema or atypical pulmonary infection such as mycoplasma.   Electronically Signed   By: Dereck Ligas M.D.   On: 04/17/2014 20:34      FOLLOW UP PLANS AND APPOINTMENTS    Medication List    STOP taking these medications        isosorbide mononitrate 60 MG 24 hr tablet  Commonly known as:  IMDUR      TAKE these medications        acetaminophen 500 MG tablet  Commonly known as:  TYLENOL  Take 500 mg by mouth every 6 (six) hours as needed for fever or headache.     ALPRAZolam 0.25 MG tablet  Commonly known as:  XANAX  Take 0.25 mg by mouth at bedtime as needed for anxiety.     aspirin 81  MG chewable tablet  Chew 81 mg by mouth daily.     clopidogrel 75 MG tablet  Commonly known as:  PLAVIX  Take 1 tablet (75 mg total) by mouth daily with breakfast.     escitalopram 10 MG tablet  Commonly known as:  LEXAPRO  Take 5 mg by mouth daily.     HYDROcodone-acetaminophen 5-325 MG per tablet  Commonly known as:  NORCO  Take 2 tablets by mouth every 4 (four) hours as needed.     levothyroxine 150 MCG tablet  Commonly known as:  SYNTHROID, LEVOTHROID  Take 150 mcg by mouth daily before breakfast.     metoprolol tartrate 25 MG tablet  Commonly known as:  LOPRESSOR  Take 25 mg by mouth 2 (two) times daily.     niCARdipine 20 MG capsule  Commonly known as:  CARDENE  Take 20 mg by mouth 2 (two) times daily.     nitroGLYCERIN 0.4 MG SL tablet   Commonly known as:  NITROSTAT  Place 1 tablet (0.4 mg total) under the tongue every 5 (five) minutes as needed for chest pain.     nitroGLYCERIN 2 % ointment  Commonly known as:  NITROGLYN  Apply 1 inch topically 3 (three) times daily.     pantoprazole 40 MG tablet  Commonly known as:  PROTONIX  Take 1 tablet (40 mg total) by mouth daily at 12 noon.     polyethylene glycol packet  Commonly known as:  MIRALAX / GLYCOLAX  Take 17 g by mouth daily.     ranolazine 1000 MG SR tablet  Commonly known as:  RANEXA  Take 1 tablet (1,000 mg total) by mouth 2 (two) times daily.     traMADol 50 MG tablet  Commonly known as:  ULTRAM  Take 1 tablet (50 mg total) by mouth every 6 (six) hours as needed.     VYTORIN 10-20 MG per tablet  Generic drug:  ezetimibe-simvastatin  Take 1 tablet by mouth daily at 6 PM.           Follow-up Information    Follow up with Laverda Page, MD.   Specialty:  Cardiology   Why:  Keep previous appointment in 1 week   Contact information:   [address] [place] [address] [phone]        Laverda Page, MD 05/10/2014, 2:43 PM  Pager: [phone] Office: [phone] If no answer: [phone]

## 2014-05-10 NOTE — Progress Notes (Signed)
Ambulated along the hallway with assistance, complained of slight short of breath on ambulation on room air. V/s taken bp-114/47, hr- 56, pulse ox 96 % on room air. Cont. to monitor.

## 2014-05-10 NOTE — Progress Notes (Signed)
Discharged home by wheelchair accompanied by daughter, stable, discharge instructions given to pt. Belongings taken home.

## 2014-05-11 ENCOUNTER — Ambulatory Visit: Payer: 59 | Admitting: Cardiology

## 2014-06-08 ENCOUNTER — Encounter (HOSPITAL_COMMUNITY)
Admission: RE | Admit: 2014-06-08 | Discharge: 2014-06-08 | Disposition: A | Discharge status: Home / Self Care | Payer: Medicare Other | Source: Ambulatory Visit | Attending: Cardiology | Admitting: Cardiology

## 2014-06-08 NOTE — Progress Notes (Signed)
Cardiac Rehab Medication Review by a Pharmacist  Does the patient  feel that his/her medications are working for him/her?  yes  Has the patient been experiencing any side effects to the medications prescribed?  Yes. Pt felt dizzy and nauseous with ranexa so MD decreased dose to 500mg  recently.  Does the patient measure his/her own blood pressure or blood glucose at home?  Yes. BP normally runs 140/70 in am. After meds around 100/60.  Does the patient have any problems obtaining medications due to transportation or finances?   no  Understanding of regimen: good Understanding of indications: good Potential of compliance: good   Pharmacist comments: Pt reports compliance with all medications and did not report any serious issues. Pt did mention being nauseous and dizzy after taking Ranexa, but ever since the dose was decreased the pt reports not symptoms.    Reginia Naas 06/08/2014 9:07 AM

## 2014-06-12 ENCOUNTER — Encounter (HOSPITAL_COMMUNITY): Payer: Self-pay

## 2014-06-12 ENCOUNTER — Encounter (HOSPITAL_COMMUNITY)
Admission: RE | Admit: 2014-06-12 | Discharge: 2014-06-12 | Disposition: A | Discharge status: Home / Self Care | Payer: Medicare Other | Source: Ambulatory Visit | Attending: Cardiology | Admitting: Cardiology

## 2014-06-12 DIAGNOSIS — Z955 Presence of coronary angioplasty implant and graft: Secondary | ICD-10-CM | POA: Diagnosis present

## 2014-06-12 DIAGNOSIS — I214 Non-ST elevation (NSTEMI) myocardial infarction: Secondary | ICD-10-CM | POA: Diagnosis present

## 2014-06-12 LAB — GLUCOSE, CAPILLARY
Glucose-Capillary: 146 mg/dL — ABNORMAL HIGH (ref 70–99)
Glucose-Capillary: 126 mg/dL — ABNORMAL HIGH (ref 70–99)

## 2014-06-12 NOTE — Progress Notes (Signed)
Pt started cardiac rehab today.  Pt tolerated light exercise.  At pt request, oxygen 2L via nasal cannula applied prior to exercise.  O2 sat-95% on room air, 99% on 2L.   VSS, telemetry-sinus rhythm, lowest HR-51 during cool down, asymptomatic with bradycardia.  Pt c/o mild episode of chest pain during first minute of exercise on airdyne bicycle.  Pain resolved immediately with rest.   Pt moved from bicycle to walking track and tolerated walking without difficulty or pain. Pt continued to exercise on light recumbent bike and nustep settings without pain or difficulty.   PHQ-5. Pt displays grief pattern associated with husband death several months ago. Pt has had difficulty coping with this.  In addition, pt has had major home plumbing malfunctions which have required renovations.  Pt also is caregiver for her 75 year old paraplegic step son. Pt feels she is no longer able to care for him physically. Therefore plans are in place for him to move to [place] to be with his sister.  Pt is discouraged with her physical decline.  Pt exhibits significant anxiety with being in group setting, participating in new activity.  However pt verbalizes desire to improve her health and is visible more relaxed, comfortable and confident post exercise.   Pt enjoys house cleaning and yard work chores, however pt is physically unable to participate in these activities presently.  Pt also enjoys spending time with her 71 week old great grandchild.   Pt cardiac rehab goals are to do more activity with less chest pain, increase stamina and be able to participate in house cleaning acitivities.  Pt oriented to exercise equipment and routine.  Understanding verbalized.

## 2014-06-14 ENCOUNTER — Encounter (HOSPITAL_COMMUNITY)
Admission: RE | Admit: 2014-06-14 | Discharge: 2014-06-14 | Disposition: A | Discharge status: Home / Self Care | Payer: Medicare Other | Source: Ambulatory Visit | Attending: Cardiology | Admitting: Cardiology

## 2014-06-14 DIAGNOSIS — I214 Non-ST elevation (NSTEMI) myocardial infarction: Secondary | ICD-10-CM | POA: Diagnosis not present

## 2014-06-14 LAB — GLUCOSE, CAPILLARY
Glucose-Capillary: 110 mg/dL — ABNORMAL HIGH (ref 70–99)
Glucose-Capillary: 125 mg/dL — ABNORMAL HIGH (ref 70–99)

## 2014-06-16 ENCOUNTER — Other Ambulatory Visit: Payer: Self-pay | Admitting: Internal Medicine

## 2014-06-16 ENCOUNTER — Ambulatory Visit
Admission: RE | Admit: 2014-06-16 | Discharge: 2014-06-16 | Disposition: A | Discharge status: Home / Self Care | Payer: Medicare Other | Source: Ambulatory Visit | Attending: Internal Medicine | Admitting: Internal Medicine

## 2014-06-16 ENCOUNTER — Encounter (HOSPITAL_COMMUNITY)
Admission: RE | Admit: 2014-06-16 | Discharge: 2014-06-16 | Disposition: A | Discharge status: Home / Self Care | Payer: Medicare Other | Source: Ambulatory Visit | Attending: Cardiology | Admitting: Cardiology

## 2014-06-16 DIAGNOSIS — I214 Non-ST elevation (NSTEMI) myocardial infarction: Secondary | ICD-10-CM | POA: Diagnosis not present

## 2014-06-16 DIAGNOSIS — R519 Headache, unspecified: Secondary | ICD-10-CM

## 2014-06-16 DIAGNOSIS — R51 Headache: Principal | ICD-10-CM

## 2014-06-16 LAB — GLUCOSE, CAPILLARY
Glucose-Capillary: 127 mg/dL — ABNORMAL HIGH (ref 70–99)
Glucose-Capillary: 84 mg/dL (ref 70–99)

## 2014-06-19 ENCOUNTER — Encounter (HOSPITAL_COMMUNITY)
Admission: RE | Admit: 2014-06-19 | Discharge: 2014-06-19 | Disposition: A | Discharge status: Home / Self Care | Payer: Medicare Other | Source: Ambulatory Visit | Attending: Cardiology | Admitting: Cardiology

## 2014-06-19 DIAGNOSIS — I214 Non-ST elevation (NSTEMI) myocardial infarction: Secondary | ICD-10-CM | POA: Diagnosis not present

## 2014-06-19 LAB — GLUCOSE, CAPILLARY
Glucose-Capillary: 130 mg/dL — ABNORMAL HIGH (ref 70–99)
Glucose-Capillary: 97 mg/dL (ref 70–99)

## 2014-06-19 NOTE — Progress Notes (Signed)
Quality of life score review:   Pt QOL scores low in health/functioning- 8.68 and psychosocial-15.00. Pt quality of life is altered by her physical decline related to her symptoms from coronary disease.  Pt chest pain has improving with addition of NTG patch.  However pt continues to have DOE with walking to [employer] and household chores.  Pt uses home oxygen at bedtime only.  Pt takes daily naps.    Pt has significant stressors which are self reported as recent death of her husband unexpectantly 7 months ago.  This has been obviously devastating for her, however unfortunately she  exhibits normal grief pattern . Pt mentioned interest in Hospice grief counseling program. Information given to patient about [facility]. Pt is also caregiver of her adult paraplegic stepson.  This task is too great for her at this time with her health, therefore he will be moved to his sister's home in the near future.  Currently pt has assistance from her daughter and her family to help with the  heavy care giving tasks.    This saddens patient that she is no longer physically able to care for him.  pt exhibits positive coping skills with supportive family and hopeful outlook.  Pt does have faith base and some social support from her church.  Pt is looking forward to participating in rehab and admits that she has already seen some improvement her first week.    Pt encouraged to attend stress management and meditation education classes for improved stress management techniques.  Pt educated about importance of handling stressors in a healthy way.   Offered emotional support and reassurance.  Will continue to monitor pt stress and emotional needs.

## 2014-06-21 ENCOUNTER — Encounter (HOSPITAL_COMMUNITY)
Admission: RE | Admit: 2014-06-21 | Discharge: 2014-06-21 | Disposition: A | Discharge status: Home / Self Care | Payer: Medicare Other | Source: Ambulatory Visit | Attending: Cardiology | Admitting: Cardiology

## 2014-06-21 DIAGNOSIS — I214 Non-ST elevation (NSTEMI) myocardial infarction: Secondary | ICD-10-CM | POA: Diagnosis not present

## 2014-06-21 LAB — GLUCOSE, CAPILLARY: Glucose-Capillary: 141 mg/dL — ABNORMAL HIGH (ref 70–99)

## 2014-06-21 NOTE — Progress Notes (Addendum)
Pt arrived at cardiac rehab reporting hypoglycemic episode at home yesterday.  CBG-68 pt symptomatic, feeling weak, shaky, diaphoretic and flushed. Pt drank soda, ate 3 pieces of candy to bring up her blood sugar.  Once stabilized she ate tuna and crackers. Pt admits to skipping lunch yesterday and taking care of 75 week old great grand baby.   Pt instructed small frequent meals may be necessary and she should avoid skipping meals especially in times of activity.  Pt advised of healthy carbohydrates choices to eat in event of hypoglycemic episodes.   Understanding verbalized.  Pt has upcoming scheduled appointment with her PCP to discuss.  Pt CBG-141 at cardiac rehab today.  Pt tolerated exercise without difficulty.

## 2014-06-23 ENCOUNTER — Encounter (HOSPITAL_COMMUNITY)
Admission: RE | Admit: 2014-06-23 | Discharge: 2014-06-23 | Disposition: A | Discharge status: Home / Self Care | Payer: Medicare Other | Source: Ambulatory Visit | Attending: Cardiology | Admitting: Cardiology

## 2014-06-23 DIAGNOSIS — I214 Non-ST elevation (NSTEMI) myocardial infarction: Secondary | ICD-10-CM | POA: Diagnosis not present

## 2014-06-23 LAB — GLUCOSE, CAPILLARY
Glucose-Capillary: 151 mg/dL — ABNORMAL HIGH (ref 70–99)
Glucose-Capillary: 121 mg/dL — ABNORMAL HIGH (ref 70–99)

## 2014-06-26 ENCOUNTER — Encounter (HOSPITAL_COMMUNITY)
Admission: RE | Admit: 2014-06-26 | Discharge: 2014-06-26 | Disposition: A | Discharge status: Home / Self Care | Payer: Medicare Other | Source: Ambulatory Visit | Attending: Cardiology | Admitting: Cardiology

## 2014-06-26 DIAGNOSIS — I214 Non-ST elevation (NSTEMI) myocardial infarction: Secondary | ICD-10-CM | POA: Diagnosis not present

## 2014-06-26 LAB — GLUCOSE, CAPILLARY: Glucose-Capillary: 105 mg/dL — ABNORMAL HIGH (ref 70–99)

## 2014-06-26 NOTE — Progress Notes (Signed)
Desiree Leon 74 y.o. female Nutrition Note Spoke with pt. Nutrition Plan and Nutrition Survey goals reviewed with pt. Pt is following Step 2 of the Therapeutic Lifestyle Changes diet. Pt wants to lose wt. Wt loss tips briefly reviewed.  Pt is a diet-controlled diabetic. A1c indicates blood glucose well-controlled. This Probation officer went over Diabetes Education test results. Pt expressed understanding of the information reviewed. Pt aware of nutrition education classes offered. Lab Results  Component Value Date   HGBA1C 6.0* 04/20/2014   Nutrition Diagnosis ? Food-and nutrition-related knowledge deficit related to lack of exposure to information as related to diagnosis of: ? CVD ? DM  ? Overweight related to excessive energy intake as evidenced by a BMI of 27.8  Nutrition RX/ Estimated Daily Nutrition Needs for: wt loss 1200-1500 Kcal, 30-40 gm fat, 9-11 gm sat fat, 1.2-1.5 gm trans-fat, <1500 mg sodium, 150-175 gm CHO   Nutrition Intervention ? Pt's individual nutrition plan reviewed with pt. ? Benefits of adopting Therapeutic Lifestyle Changes discussed when Medficts reviewed. ? Pt to attend the Portion Distortion class ? Pt to attend the Diabetes Q & A class - met; 06/16/14 ? Pt given handouts for: ? Nutrition I class ? Nutrition II class ? Diabetes Blitz Class ? Continue client-centered nutrition education by RD, as part of interdisciplinary care. Goal(s) ? Pt to identify food quantities necessary to achieve: ? wt loss to a goal wt of 140-158 lb (63.6-71.8 kg) at graduation from cardiac rehab.  ? Pt to describe the benefit of including fruits, vegetables, whole grains, and low-fat dairy products in a heart healthy meal plan. Monitor and Evaluate progress toward nutrition goal with team. Nutrition Risk: Change to Moderate Derek Mound, M.Ed, RD, LDN, CDE 06/26/2014 9:28 AM

## 2014-06-28 ENCOUNTER — Encounter (HOSPITAL_COMMUNITY)
Admission: RE | Admit: 2014-06-28 | Discharge: 2014-06-28 | Disposition: A | Discharge status: Home / Self Care | Payer: Medicare Other | Source: Ambulatory Visit | Attending: Cardiology | Admitting: Cardiology

## 2014-06-28 ENCOUNTER — Ambulatory Visit (HOSPITAL_COMMUNITY)
Admission: RE | Admit: 2014-06-28 | Discharge: 2014-06-28 | Disposition: A | Discharge status: Home / Self Care | Payer: Medicare Other | Source: Ambulatory Visit | Attending: Internal Medicine | Admitting: Internal Medicine

## 2014-06-28 DIAGNOSIS — Z9861 Coronary angioplasty status: Secondary | ICD-10-CM | POA: Diagnosis not present

## 2014-06-28 DIAGNOSIS — Z48812 Encounter for surgical aftercare following surgery on the circulatory system: Secondary | ICD-10-CM | POA: Insufficient documentation

## 2014-06-28 DIAGNOSIS — I214 Non-ST elevation (NSTEMI) myocardial infarction: Secondary | ICD-10-CM | POA: Diagnosis not present

## 2014-06-28 LAB — GLUCOSE, CAPILLARY: Glucose-Capillary: 132 mg/dL — ABNORMAL HIGH (ref 70–99)

## 2014-06-28 NOTE — Progress Notes (Signed)
Pt arrived at cardiac rehab this reporting c/o chest pain at home last night. CP relieved with rest, pt did not take NTG.  Pt also c/o  hypotension and hypoglycemia at this morning.  Home BP:  95/54 CBG: 95.  Pt took medications are directed. Pt reports she worked hard with home cleaning projects yesterday.  Pt also admits to emotional upset from grief cleaning out closets brought back memories of her husband.  Pt c/o  Neck pain with exercise at cardiac rehab during last station. 9:05am Rates 5/10.  No radiation of pain to chest or jaw.  Exercise stopped pt brought to treatment room to lay down.  BP:  118/56 O2 sat- 100% 2L [place].  9:08 am rates 3/10.  EKG performed  Dr. Einar Gip nurse made aware. 12 lead faxed to office for review. 913 am 1/10.  936 c/o teeth pain 4/10.  BP-119/55 O2 sat-100% 2L. Pt given NTG SL x1.  0938 pain completely relieved.  Recheck BP:  98/52 Pt walked 2 laps around track without return of discomfort. Post ambulation BP 131/53.  Dr. Irven Shelling office notified. Per Bridgette, NP, ok for pt to DC home if asymptomatic.    Upon further discussion with pt and her daughter pt has significant situational stress and anxiety at home including grieving for deceased husband and preparing for her paraplegic stepson to move out of her home since she is physically unable to continue to care for him.  She also admits to anxiety created from fear of possible conflict between her step daughter and step son over his move and his belongings. Pt has been preparing for his move and packing for him as she has been cleaning out boxes from attic and closets.  Pt educated about importance of handling stress appropriately and given stress management techniques. Pt also advised to plan for periods of rest while participating in heavy activities. Lastly pt cautioned to avoid overexertion. Pt verbalized understanding.  Pt and pt dtr request portable oxygen for PRN day time and travel use.  Dr. Einar Gip made aware of this  request.

## 2014-06-30 ENCOUNTER — Encounter (HOSPITAL_COMMUNITY)
Admission: RE | Admit: 2014-06-30 | Discharge: 2014-06-30 | Disposition: A | Discharge status: Home / Self Care | Payer: Medicare Other | Source: Ambulatory Visit | Attending: Cardiology | Admitting: Cardiology

## 2014-06-30 DIAGNOSIS — I214 Non-ST elevation (NSTEMI) myocardial infarction: Secondary | ICD-10-CM | POA: Diagnosis not present

## 2014-06-30 LAB — GLUCOSE, CAPILLARY: Glucose-Capillary: 124 mg/dL — ABNORMAL HIGH (ref 70–99)

## 2014-07-03 ENCOUNTER — Encounter (HOSPITAL_COMMUNITY)
Admission: RE | Admit: 2014-07-03 | Discharge: 2014-07-03 | Disposition: A | Discharge status: Home / Self Care | Payer: Medicare Other | Source: Ambulatory Visit | Attending: Cardiology | Admitting: Cardiology

## 2014-07-03 DIAGNOSIS — I214 Non-ST elevation (NSTEMI) myocardial infarction: Secondary | ICD-10-CM | POA: Diagnosis not present

## 2014-07-03 LAB — GLUCOSE, CAPILLARY: Glucose-Capillary: 144 mg/dL — ABNORMAL HIGH (ref 70–99)

## 2014-07-03 NOTE — Progress Notes (Signed)
Pt given copy of cardiac rehab report to take to MD appointment today.  Consent form form for release of medical records signed by patient.

## 2014-07-03 NOTE — Progress Notes (Signed)
Reviewed home exercise guidelines with patient including endpoints, temperature precautions, target heart rate and rate of perceived exertion. Pt plans to continue walking as her mode of home exercise. Pt voices understanding of instructions given.  Rhae Lerner, Academic Intern  Sol Passer, MS, ACSM CCEP

## 2014-07-05 ENCOUNTER — Encounter (HOSPITAL_COMMUNITY): Payer: Medicare Other

## 2014-07-05 DIAGNOSIS — F1320 Sedative, hypnotic or anxiolytic dependence, uncomplicated: Secondary | ICD-10-CM | POA: Insufficient documentation

## 2014-07-07 ENCOUNTER — Encounter (HOSPITAL_COMMUNITY): Payer: Medicare Other

## 2014-07-10 ENCOUNTER — Encounter (HOSPITAL_COMMUNITY)
Admission: RE | Admit: 2014-07-10 | Discharge: 2014-07-10 | Disposition: A | Discharge status: Home / Self Care | Payer: Medicare Other | Source: Ambulatory Visit | Attending: Cardiology | Admitting: Cardiology

## 2014-07-10 ENCOUNTER — Other Ambulatory Visit: Payer: Medicare Other

## 2014-07-10 DIAGNOSIS — I214 Non-ST elevation (NSTEMI) myocardial infarction: Secondary | ICD-10-CM | POA: Insufficient documentation

## 2014-07-10 DIAGNOSIS — Z955 Presence of coronary angioplasty implant and graft: Secondary | ICD-10-CM | POA: Insufficient documentation

## 2014-07-12 ENCOUNTER — Encounter (HOSPITAL_COMMUNITY)
Admission: RE | Admit: 2014-07-12 | Discharge: 2014-07-12 | Disposition: A | Discharge status: Home / Self Care | Payer: Medicare Other | Source: Ambulatory Visit | Attending: Cardiology | Admitting: Cardiology

## 2014-07-12 DIAGNOSIS — I214 Non-ST elevation (NSTEMI) myocardial infarction: Secondary | ICD-10-CM | POA: Diagnosis not present

## 2014-07-14 ENCOUNTER — Encounter (HOSPITAL_COMMUNITY)
Admission: RE | Admit: 2014-07-14 | Discharge: 2014-07-14 | Disposition: A | Discharge status: Home / Self Care | Payer: Medicare Other | Source: Ambulatory Visit | Attending: Cardiology | Admitting: Cardiology

## 2014-07-14 DIAGNOSIS — I214 Non-ST elevation (NSTEMI) myocardial infarction: Secondary | ICD-10-CM | POA: Diagnosis not present

## 2014-07-17 ENCOUNTER — Encounter (HOSPITAL_COMMUNITY)
Admission: RE | Admit: 2014-07-17 | Discharge: 2014-07-17 | Disposition: A | Discharge status: Home / Self Care | Payer: Medicare Other | Source: Ambulatory Visit | Attending: Cardiology | Admitting: Cardiology

## 2014-07-17 DIAGNOSIS — I214 Non-ST elevation (NSTEMI) myocardial infarction: Secondary | ICD-10-CM | POA: Diagnosis not present

## 2014-07-19 ENCOUNTER — Encounter (HOSPITAL_COMMUNITY): Payer: Medicare Other

## 2014-07-21 ENCOUNTER — Encounter (HOSPITAL_COMMUNITY): Payer: Medicare Other

## 2014-07-24 ENCOUNTER — Encounter (HOSPITAL_COMMUNITY)
Admission: RE | Admit: 2014-07-24 | Discharge: 2014-07-24 | Disposition: A | Discharge status: Home / Self Care | Payer: Medicare Other | Source: Ambulatory Visit | Attending: Cardiology | Admitting: Cardiology

## 2014-07-24 DIAGNOSIS — I214 Non-ST elevation (NSTEMI) myocardial infarction: Secondary | ICD-10-CM | POA: Diagnosis not present

## 2014-07-24 NOTE — Progress Notes (Signed)
  30 day Psychosocial followup assessment  Patient psychosocial assessment reveals no barriers to cardiac rehab participation.  Psychosocial areas that are affecting patient's rehab experience include concerns about her angina, physical deconditioning and grief of her husband and emotional issues as evidenced by sadness and anxiety symptoms.  Patient does  exhibit positive coping skills to deal with psychosocial concerns, which is growth.  Fortunately, pt has had opportunity to get away on family vacations the past few weeks and was pampered by her family for mother's day.  Pt does admit to emotional distress yesterday during the move out of her handicapped stepson, who is going to live with his sister.  Lavoris is no longer physically able to care for him.  Pt admits to having anginal symptoms as a result of teh stress yesterday however none today.  Pt did not take NTG.  Patient does  feel making progress towards cardiac rehab goals.  Patient reports health and activity level have improved in the past 30 days as evidenced by patient's reports of increased ability to ambulate within her home without dyspnea or pain under normal circumstances. Pt is also able to exercise at cardiac rehab without symptoms on 1L oxygen.  Patient states family/friends  have noticed changes in inactivity or mood. Patient reports feeling positive about current and projected progress toward cardiac rehab goals and her current family situation. Pt has not called Hospice for grief counseling since she has been busy with her family trips. Reinforced with patient Hospice is available for her should she need the support.  Patient's rate of progress towards goals is good .  Plan of action to help patient continue to work towards rehab goals include home exercise and stress management education.  Will continue to monitor and evaluate progress toward psychosocial goals.   Goals in progress:  improved management of anxiety and stress management  techniques.  improved coping skills  Help patient work toward returning to meaningful activities that improve patient's quality of life and are attainable.

## 2014-07-26 ENCOUNTER — Encounter (HOSPITAL_COMMUNITY)
Admission: RE | Admit: 2014-07-26 | Discharge: 2014-07-26 | Disposition: A | Discharge status: Home / Self Care | Payer: Medicare Other | Source: Ambulatory Visit | Attending: Cardiology | Admitting: Cardiology

## 2014-07-26 DIAGNOSIS — I214 Non-ST elevation (NSTEMI) myocardial infarction: Secondary | ICD-10-CM | POA: Diagnosis not present

## 2014-07-28 ENCOUNTER — Encounter (HOSPITAL_COMMUNITY)
Admission: RE | Admit: 2014-07-28 | Discharge: 2014-07-28 | Disposition: A | Discharge status: Home / Self Care | Payer: Medicare Other | Source: Ambulatory Visit | Attending: Cardiology | Admitting: Cardiology

## 2014-07-28 DIAGNOSIS — I214 Non-ST elevation (NSTEMI) myocardial infarction: Secondary | ICD-10-CM | POA: Diagnosis not present

## 2014-07-31 ENCOUNTER — Encounter (HOSPITAL_COMMUNITY)
Admission: RE | Admit: 2014-07-31 | Discharge: 2014-07-31 | Disposition: A | Discharge status: Home / Self Care | Payer: Medicare Other | Source: Ambulatory Visit | Attending: Cardiology | Admitting: Cardiology

## 2014-07-31 DIAGNOSIS — I214 Non-ST elevation (NSTEMI) myocardial infarction: Secondary | ICD-10-CM | POA: Diagnosis not present

## 2014-08-02 ENCOUNTER — Encounter (HOSPITAL_COMMUNITY)
Admission: RE | Admit: 2014-08-02 | Discharge: 2014-08-02 | Disposition: A | Discharge status: Home / Self Care | Payer: Medicare Other | Source: Ambulatory Visit | Attending: Cardiology | Admitting: Cardiology

## 2014-08-02 DIAGNOSIS — I214 Non-ST elevation (NSTEMI) myocardial infarction: Secondary | ICD-10-CM | POA: Diagnosis not present

## 2014-08-02 NOTE — Progress Notes (Signed)
Pt for today's exercise session forgot to put on  her NTG patch today.  Pt advised to exercise lightly today.  Pt did have a brief instance of cp level 3 that resolved with rest.  O2 increased to 2lncc.  Pt able to continue with exercise today with no further instance.  Cool down HR noted to be 51.  Pt asymptomatic.  Will fax rehab report along with strip to Dr. Einar Gip for review. Cherre Huger, BSN

## 2014-08-04 ENCOUNTER — Telehealth (HOSPITAL_COMMUNITY): Payer: Self-pay | Admitting: Cardiac Rehabilitation

## 2014-08-04 ENCOUNTER — Encounter (HOSPITAL_COMMUNITY): Admission: RE | Admit: 2014-08-04 | Payer: Medicare Other | Source: Ambulatory Visit

## 2014-08-04 NOTE — Telephone Encounter (Signed)
pc received from pt daughter, pt will be absent from cardiac rehab today due to chest pain.  Pain started Wednesday after pt forgot to apply new NTG patch.  Dr. Einar Gip aware and instructed pt to take ranexa 1000mg  one time dose.  Pt using continuous home oxygen via nasal cannula.   Pain subsided however returned again last night associated with hypertension.  Pt currently pain free with rest however angina returns with light exertion.  Pt daughter advised to notify Dr. Irven Shelling office this morning of last nights event.  Pt has stress test scheduled 08/11/2014  in Dr. Irven Shelling office.

## 2014-08-09 ENCOUNTER — Encounter (HOSPITAL_COMMUNITY): Payer: Medicare Other

## 2014-08-11 ENCOUNTER — Encounter (HOSPITAL_COMMUNITY): Payer: Medicare Other

## 2014-08-14 ENCOUNTER — Encounter (HOSPITAL_COMMUNITY)
Admission: RE | Admit: 2014-08-14 | Discharge: 2014-08-14 | Disposition: A | Discharge status: Home / Self Care | Payer: Medicare Other | Source: Ambulatory Visit | Attending: Cardiology | Admitting: Cardiology

## 2014-08-14 DIAGNOSIS — I214 Non-ST elevation (NSTEMI) myocardial infarction: Secondary | ICD-10-CM | POA: Insufficient documentation

## 2014-08-14 DIAGNOSIS — Z955 Presence of coronary angioplasty implant and graft: Secondary | ICD-10-CM | POA: Insufficient documentation

## 2014-08-16 ENCOUNTER — Encounter (HOSPITAL_COMMUNITY)
Admission: RE | Admit: 2014-08-16 | Discharge: 2014-08-16 | Disposition: A | Discharge status: Home / Self Care | Payer: Medicare Other | Source: Ambulatory Visit | Attending: Cardiology | Admitting: Cardiology

## 2014-08-16 DIAGNOSIS — I214 Non-ST elevation (NSTEMI) myocardial infarction: Secondary | ICD-10-CM | POA: Diagnosis not present

## 2014-08-16 NOTE — Progress Notes (Signed)
  60 day Psychosocial followup assessment  Patient psychosocial assessment reveals no barriers to cardiac rehab participation.  Psychosocial areas that are affecting patient's rehab experience include concerns about health and safety at home  and emotional stress from burdens of caregiver role.  Pt reports since her step son has moved she no longer has caregiver burden.  The physical and emotional stress have been removed from her home which helps her relax and care for herself.  She enjoys spending time with her great grandchildren although she admits the baby is getting difficult for her to hold as it grows.   Patient  does  exhibit positive coping skills to deal with psychosocial concerns.  Patient does feel making progress towards cardiac rehab goals.  Patient reports health and activity level are  improved in the past 30 days as evidenced by patient's reports of recent ability to complete 5:30 Bruce protocol on room air without significant angina.  Patient states  family/friends  have noticed changes in inactivity or mood. Patient reports feeling positive about current and projected progress toward cardiac rehab goals.  Patient's rate of progress towards goals is good .  Plan of action to help patient continue to work towards rehab goals include continued encouragement and exercise and stress management techniques.  Will continue to monitor and evaluate progress toward psychosocial goals.   Goals in progress:   improved management of stress/anxiety.   improved coping skills, especially displaying normal grief pattern   Help patient work toward returning to meaningful activities that improve patient's quality of life and are attainable.

## 2014-08-18 ENCOUNTER — Encounter (HOSPITAL_COMMUNITY)
Admission: RE | Admit: 2014-08-18 | Discharge: 2014-08-18 | Disposition: A | Discharge status: Home / Self Care | Payer: Medicare Other | Source: Ambulatory Visit | Attending: Cardiology | Admitting: Cardiology

## 2014-08-18 DIAGNOSIS — I214 Non-ST elevation (NSTEMI) myocardial infarction: Secondary | ICD-10-CM | POA: Diagnosis not present

## 2014-08-21 ENCOUNTER — Encounter (HOSPITAL_COMMUNITY)
Admission: RE | Admit: 2014-08-21 | Discharge: 2014-08-21 | Disposition: A | Discharge status: Home / Self Care | Payer: Medicare Other | Source: Ambulatory Visit | Attending: Cardiology | Admitting: Cardiology

## 2014-08-21 DIAGNOSIS — I214 Non-ST elevation (NSTEMI) myocardial infarction: Secondary | ICD-10-CM | POA: Diagnosis not present

## 2014-08-23 ENCOUNTER — Encounter (HOSPITAL_COMMUNITY)
Admission: RE | Admit: 2014-08-23 | Discharge: 2014-08-23 | Disposition: A | Discharge status: Home / Self Care | Payer: Medicare Other | Source: Ambulatory Visit | Attending: Cardiology | Admitting: Cardiology

## 2014-08-23 DIAGNOSIS — I214 Non-ST elevation (NSTEMI) myocardial infarction: Secondary | ICD-10-CM | POA: Diagnosis not present

## 2014-08-25 ENCOUNTER — Encounter (HOSPITAL_COMMUNITY)
Admission: RE | Admit: 2014-08-25 | Discharge: 2014-08-25 | Disposition: A | Discharge status: Home / Self Care | Payer: Medicare Other | Source: Ambulatory Visit | Attending: Cardiology | Admitting: Cardiology

## 2014-08-25 DIAGNOSIS — I214 Non-ST elevation (NSTEMI) myocardial infarction: Secondary | ICD-10-CM | POA: Diagnosis not present

## 2014-08-28 ENCOUNTER — Encounter (HOSPITAL_COMMUNITY)
Admission: RE | Admit: 2014-08-28 | Discharge: 2014-08-28 | Disposition: A | Discharge status: Home / Self Care | Payer: Medicare Other | Source: Ambulatory Visit | Attending: Cardiology | Admitting: Cardiology

## 2014-08-28 DIAGNOSIS — I214 Non-ST elevation (NSTEMI) myocardial infarction: Secondary | ICD-10-CM | POA: Diagnosis not present

## 2014-08-30 ENCOUNTER — Encounter (HOSPITAL_COMMUNITY)
Admission: RE | Admit: 2014-08-30 | Discharge: 2014-08-30 | Disposition: A | Discharge status: Home / Self Care | Payer: Medicare Other | Source: Ambulatory Visit | Attending: Cardiology | Admitting: Cardiology

## 2014-08-30 DIAGNOSIS — I214 Non-ST elevation (NSTEMI) myocardial infarction: Secondary | ICD-10-CM | POA: Diagnosis not present

## 2014-09-01 ENCOUNTER — Encounter (HOSPITAL_COMMUNITY)
Admission: RE | Admit: 2014-09-01 | Discharge: 2014-09-01 | Disposition: A | Discharge status: Home / Self Care | Payer: Medicare Other | Source: Ambulatory Visit | Attending: Cardiology | Admitting: Cardiology

## 2014-09-01 DIAGNOSIS — I214 Non-ST elevation (NSTEMI) myocardial infarction: Secondary | ICD-10-CM | POA: Diagnosis not present

## 2014-09-04 ENCOUNTER — Encounter (HOSPITAL_COMMUNITY)
Admission: RE | Admit: 2014-09-04 | Discharge: 2014-09-04 | Disposition: A | Discharge status: Home / Self Care | Payer: Medicare Other | Source: Ambulatory Visit | Attending: Cardiology | Admitting: Cardiology

## 2014-09-04 DIAGNOSIS — I214 Non-ST elevation (NSTEMI) myocardial infarction: Secondary | ICD-10-CM | POA: Diagnosis not present

## 2014-09-06 ENCOUNTER — Encounter (HOSPITAL_COMMUNITY)
Admission: RE | Admit: 2014-09-06 | Discharge: 2014-09-06 | Disposition: A | Discharge status: Home / Self Care | Payer: Medicare Other | Source: Ambulatory Visit | Attending: Cardiology | Admitting: Cardiology

## 2014-09-06 DIAGNOSIS — I214 Non-ST elevation (NSTEMI) myocardial infarction: Secondary | ICD-10-CM | POA: Diagnosis not present

## 2014-09-08 ENCOUNTER — Encounter (HOSPITAL_COMMUNITY)
Admission: RE | Admit: 2014-09-08 | Discharge: 2014-09-08 | Disposition: A | Discharge status: Home / Self Care | Payer: Medicare Other | Source: Ambulatory Visit | Attending: Cardiology | Admitting: Cardiology

## 2014-09-08 DIAGNOSIS — I214 Non-ST elevation (NSTEMI) myocardial infarction: Secondary | ICD-10-CM | POA: Insufficient documentation

## 2014-09-08 DIAGNOSIS — Z955 Presence of coronary angioplasty implant and graft: Secondary | ICD-10-CM | POA: Diagnosis present

## 2014-09-13 ENCOUNTER — Encounter (HOSPITAL_COMMUNITY)
Admission: RE | Admit: 2014-09-13 | Discharge: 2014-09-13 | Disposition: A | Discharge status: Home / Self Care | Payer: Medicare Other | Source: Ambulatory Visit | Attending: Cardiology | Admitting: Cardiology

## 2014-09-13 DIAGNOSIS — I214 Non-ST elevation (NSTEMI) myocardial infarction: Secondary | ICD-10-CM | POA: Diagnosis not present

## 2014-09-13 NOTE — Progress Notes (Signed)
  90 day Psychosocial followup assessment  Patient psychosocial assessment reveals no barriers to cardiac rehab participation.  Psychosocial areas that are affecting patient's rehab experience include concerns about recent death of her husband, former caregiver role and health concerns.  Patient   does continue to exhibit positive coping skills to deal with psychosocial concerns.  Pt reports she is coping much better with the grief of losing her husband, she contributes time with her family and friends as helping with this.  Pt states she no longer feels she needs to participate in grief counseling, but she is keeping the hospice phone number as a resource in case her situation changes.  Pt is also uplifted by the freedom of no longer being responsible for total care of her disabled step son.  Pt enjoys talking with on the phone and maintaining the relationship.  Pt quality of life scores are significantly increased in most areas.  Patient does  feel making progress towards cardiac rehab goals.  Pt is able to do more activity at home before angina occurs.  Pt has learned to build periods of rest into her routine.  She is also learning to delegate difficult activities such as mopping floors to others.  Pt does continue to c/o dyspnea on exertion with home activities. She admits she is practicing diaphragmatic breathing exercises but not purse lip breathing.  Pt encouraged to incorporate purse lip breathing into her daily routine.  Education provided, pt return demonstrated.   Patient reports health and activity level  improved in the past 30 days as evidenced by patient's reports of increased ability to ambulate further without pain.  Patient states family/friends  have noticed changes in inactivity or mood. Patient reports feeling positive about current and projected progress toward cardiac rehab goals.  Patient's rate of progress towards goals is good .  Plan of action to help patient continue to work towards  rehab goals include home exercise plan.  Pt is currently walking at shopping places, as well as home cleaning and babysitting great grandchild.  Will continue to monitor and evaluate progress toward psychosocial goals.   Goals in progress:  improved management of anxiety.   improved coping skills, displays normal grief pattern  Help patient work toward returning to meaningful activities that improve patient's quality of life and are attainable.  Pt verbalizes enjoying time with her family and hosting recent holiday cookout.

## 2014-09-15 ENCOUNTER — Encounter (HOSPITAL_COMMUNITY)
Admission: RE | Admit: 2014-09-15 | Discharge: 2014-09-15 | Disposition: A | Discharge status: Home / Self Care | Payer: Medicare Other | Source: Ambulatory Visit | Attending: Cardiology | Admitting: Cardiology

## 2014-09-15 DIAGNOSIS — I214 Non-ST elevation (NSTEMI) myocardial infarction: Secondary | ICD-10-CM | POA: Diagnosis not present

## 2014-09-18 ENCOUNTER — Encounter (HOSPITAL_COMMUNITY)
Admission: RE | Admit: 2014-09-18 | Discharge: 2014-09-18 | Disposition: A | Discharge status: Home / Self Care | Payer: Medicare Other | Source: Ambulatory Visit | Attending: Cardiology | Admitting: Cardiology

## 2014-09-18 DIAGNOSIS — I214 Non-ST elevation (NSTEMI) myocardial infarction: Secondary | ICD-10-CM | POA: Diagnosis not present

## 2014-09-20 ENCOUNTER — Encounter (HOSPITAL_COMMUNITY)
Admission: RE | Admit: 2014-09-20 | Discharge: 2014-09-20 | Disposition: A | Discharge status: Home / Self Care | Payer: Medicare Other | Source: Ambulatory Visit | Attending: Cardiology | Admitting: Cardiology

## 2014-09-20 DIAGNOSIS — I214 Non-ST elevation (NSTEMI) myocardial infarction: Secondary | ICD-10-CM | POA: Diagnosis not present

## 2014-09-22 ENCOUNTER — Encounter (HOSPITAL_COMMUNITY)
Admission: RE | Admit: 2014-09-22 | Discharge: 2014-09-22 | Disposition: A | Discharge status: Home / Self Care | Payer: Medicare Other | Source: Ambulatory Visit | Attending: Cardiology | Admitting: Cardiology

## 2014-09-22 ENCOUNTER — Encounter (HOSPITAL_COMMUNITY): Payer: Self-pay

## 2014-09-22 DIAGNOSIS — I214 Non-ST elevation (NSTEMI) myocardial infarction: Secondary | ICD-10-CM | POA: Diagnosis not present

## 2014-09-22 NOTE — Progress Notes (Signed)
Pt graduated from cardiac rehab program today with completion of 36 exercise sessions in Phase II. Pt maintained good attendance and progressed nicely during his participation in rehab as evidenced by increased MET level.   Medication list reconciled. Repeat  PHQ score-0 which is improved from 5.  Pt situational stress of caregiver has been relieved. Pt has learned more effective coping skills to help with her grief from death of husband.    Pt has made significant lifestyle changes and should be commended for her success. Pt has increased functional ability and decreased angina/anxiety episodes.  Pt reports she is able to sweep the floor without getting angina.  Pt uses NTG SL less often and knows appropriate plans to take rest breaks. Pt does continue to use oxygen 1L with exercise and this gives her adequate relief of angina and dyspnea.   Pt feels she has achieved her goals during cardiac rehab.   Pt plans to continue exercising on her own at home walking.

## 2014-09-25 ENCOUNTER — Encounter (HOSPITAL_COMMUNITY): Payer: Medicare Other

## 2014-09-27 ENCOUNTER — Encounter (HOSPITAL_COMMUNITY): Payer: Medicare Other

## 2014-09-29 ENCOUNTER — Encounter (HOSPITAL_COMMUNITY): Payer: Medicare Other

## 2014-10-02 ENCOUNTER — Encounter (HOSPITAL_COMMUNITY): Payer: Medicare Other

## 2014-10-04 ENCOUNTER — Encounter (HOSPITAL_COMMUNITY): Payer: Medicare Other

## 2014-10-06 ENCOUNTER — Encounter (HOSPITAL_COMMUNITY): Payer: Medicare Other

## 2014-10-09 ENCOUNTER — Encounter (HOSPITAL_COMMUNITY): Payer: Medicare Other

## 2014-10-11 ENCOUNTER — Encounter (HOSPITAL_COMMUNITY): Payer: Medicare Other

## 2014-10-13 ENCOUNTER — Encounter (HOSPITAL_COMMUNITY): Payer: Medicare Other

## 2015-01-01 DIAGNOSIS — Z0000 Encounter for general adult medical examination without abnormal findings: Secondary | ICD-10-CM | POA: Insufficient documentation

## 2015-01-08 DIAGNOSIS — E1159 Type 2 diabetes mellitus with other circulatory complications: Secondary | ICD-10-CM | POA: Insufficient documentation

## 2015-05-14 DIAGNOSIS — I700 Atherosclerosis of aorta: Secondary | ICD-10-CM | POA: Insufficient documentation

## 2015-05-14 DIAGNOSIS — I5042 Chronic combined systolic (congestive) and diastolic (congestive) heart failure: Secondary | ICD-10-CM | POA: Insufficient documentation

## 2016-05-07 ENCOUNTER — Telehealth (HOSPITAL_COMMUNITY): Payer: Self-pay | Admitting: Internal Medicine

## 2016-05-07 NOTE — Telephone Encounter (Signed)
Patient insurance verified. Patient has [employer] medicare: Patient has $20.00 co-payment, no deductible, no coinsurance, out of pocket max $3300.00/$19.45 has been met. No pre-authorization and no limits on visits. Passport/reference # [id number]. Information given to [facility] for review.

## 2016-06-03 ENCOUNTER — Encounter (HOSPITAL_COMMUNITY): Admission: RE | Admit: 2016-06-03 | Payer: Medicare Other | Source: Ambulatory Visit

## 2016-06-09 ENCOUNTER — Ambulatory Visit (HOSPITAL_COMMUNITY): Payer: Medicare Other

## 2016-06-13 ENCOUNTER — Ambulatory Visit (HOSPITAL_COMMUNITY): Payer: Medicare Other

## 2016-06-16 ENCOUNTER — Ambulatory Visit (HOSPITAL_COMMUNITY): Payer: Medicare Other

## 2016-06-20 ENCOUNTER — Ambulatory Visit (HOSPITAL_COMMUNITY): Payer: Medicare Other

## 2016-06-23 ENCOUNTER — Ambulatory Visit (HOSPITAL_COMMUNITY): Payer: Medicare Other

## 2016-06-27 ENCOUNTER — Ambulatory Visit (HOSPITAL_COMMUNITY): Payer: Medicare Other

## 2016-06-30 ENCOUNTER — Ambulatory Visit (HOSPITAL_COMMUNITY): Payer: Medicare Other

## 2016-07-04 ENCOUNTER — Ambulatory Visit (HOSPITAL_COMMUNITY): Payer: Medicare Other

## 2016-07-07 ENCOUNTER — Ambulatory Visit (HOSPITAL_COMMUNITY): Payer: Medicare Other

## 2016-07-11 ENCOUNTER — Ambulatory Visit (HOSPITAL_COMMUNITY): Payer: Medicare Other

## 2016-07-14 ENCOUNTER — Ambulatory Visit (HOSPITAL_COMMUNITY): Payer: Medicare Other

## 2016-07-18 ENCOUNTER — Ambulatory Visit (HOSPITAL_COMMUNITY): Payer: Medicare Other

## 2016-07-21 ENCOUNTER — Ambulatory Visit (HOSPITAL_COMMUNITY): Payer: Medicare Other

## 2016-07-25 ENCOUNTER — Ambulatory Visit (HOSPITAL_COMMUNITY): Payer: Medicare Other

## 2016-07-28 ENCOUNTER — Ambulatory Visit (HOSPITAL_COMMUNITY): Payer: Medicare Other

## 2016-07-29 ENCOUNTER — Telehealth (HOSPITAL_COMMUNITY): Payer: Self-pay

## 2016-07-29 NOTE — Telephone Encounter (Signed)
Update: Verified [employer] Medicare insurance benefits through Passport Copay $20, No Coinsurance or Deductible  Out of Pocket $3300.00, pt has met $358.32 Reference [id number]... KJ

## 2016-07-31 ENCOUNTER — Encounter (HOSPITAL_COMMUNITY): Payer: Self-pay

## 2016-07-31 ENCOUNTER — Telehealth (HOSPITAL_COMMUNITY): Payer: Self-pay | Admitting: *Deleted

## 2016-07-31 ENCOUNTER — Encounter (HOSPITAL_COMMUNITY)
Admission: RE | Admit: 2016-07-31 | Discharge: 2016-07-31 | Disposition: A | Discharge status: Home / Self Care | Payer: Medicare Other | Source: Ambulatory Visit | Attending: Cardiology | Admitting: Cardiology

## 2016-07-31 VITALS — BP 112/60 | HR 56 | Ht 64.5 in | Wt 170.9 lb

## 2016-07-31 DIAGNOSIS — M1990 Unspecified osteoarthritis, unspecified site: Secondary | ICD-10-CM | POA: Insufficient documentation

## 2016-07-31 DIAGNOSIS — Z79899 Other long term (current) drug therapy: Secondary | ICD-10-CM | POA: Insufficient documentation

## 2016-07-31 DIAGNOSIS — E785 Hyperlipidemia, unspecified: Secondary | ICD-10-CM | POA: Diagnosis not present

## 2016-07-31 DIAGNOSIS — Z87891 Personal history of nicotine dependence: Secondary | ICD-10-CM | POA: Diagnosis not present

## 2016-07-31 DIAGNOSIS — E119 Type 2 diabetes mellitus without complications: Secondary | ICD-10-CM | POA: Diagnosis not present

## 2016-07-31 DIAGNOSIS — I10 Essential (primary) hypertension: Secondary | ICD-10-CM | POA: Insufficient documentation

## 2016-07-31 DIAGNOSIS — F419 Anxiety disorder, unspecified: Secondary | ICD-10-CM | POA: Diagnosis not present

## 2016-07-31 DIAGNOSIS — I252 Old myocardial infarction: Secondary | ICD-10-CM | POA: Insufficient documentation

## 2016-07-31 DIAGNOSIS — I872 Venous insufficiency (chronic) (peripheral): Secondary | ICD-10-CM | POA: Insufficient documentation

## 2016-07-31 DIAGNOSIS — I208 Other forms of angina pectoris: Secondary | ICD-10-CM | POA: Diagnosis not present

## 2016-07-31 DIAGNOSIS — E039 Hypothyroidism, unspecified: Secondary | ICD-10-CM | POA: Diagnosis not present

## 2016-07-31 DIAGNOSIS — Z7982 Long term (current) use of aspirin: Secondary | ICD-10-CM | POA: Diagnosis not present

## 2016-07-31 DIAGNOSIS — M810 Age-related osteoporosis without current pathological fracture: Secondary | ICD-10-CM | POA: Diagnosis not present

## 2016-07-31 DIAGNOSIS — Z7902 Long term (current) use of antithrombotics/antiplatelets: Secondary | ICD-10-CM | POA: Insufficient documentation

## 2016-07-31 NOTE — Progress Notes (Signed)
Cardiac Individual Treatment Plan  Patient Details  Name: Desiree Leon MRN: [id number] Date of Birth: 1941/03/04 Referring Provider:     CARDIAC REHAB PHASE II ORIENTATION from 07/31/2016 in [place]  Referring Provider  Kela Millin MD      Initial Encounter Date:    CARDIAC REHAB PHASE II ORIENTATION from 07/31/2016 in [place]  Date  07/31/16  Referring Provider  Kela Millin MD      Visit Diagnosis: Stable angina [facility])  Patient's Home Medications on Admission:  Current Outpatient Prescriptions:  .  acetaminophen (TYLENOL) 500 MG tablet, Take 500 mg by mouth every 6 (six) hours as needed for fever or headache. , Disp: , Rfl:  .  ALPRAZolam (XANAX) 0.25 MG tablet, Take 0.125 mg by mouth at bedtime as needed for anxiety. , Disp: , Rfl:  .  aspirin 81 MG chewable tablet, Chew 81 mg by mouth daily., Disp: , Rfl:  .  clopidogrel (PLAVIX) 75 MG tablet, Take 1 tablet (75 mg total) by mouth daily with breakfast., Disp: 30 tablet, Rfl: 1 .  escitalopram (LEXAPRO) 10 MG tablet, Take 5 mg by mouth daily., Disp: , Rfl:  .  levothyroxine (SYNTHROID, LEVOTHROID) 150 MCG tablet, Take 150 mcg by mouth daily before breakfast. , Disp: , Rfl:  .  metoprolol tartrate (LOPRESSOR) 25 MG tablet, Take 25 mg by mouth 2 (two) times daily. , Disp: , Rfl:  .  niCARdipine (CARDENE) 20 MG capsule, Take 20 mg by mouth every evening. , Disp: , Rfl:  .  nitroGLYCERIN (NITRODUR - DOSED IN MG/24 HR) 0.4 mg/hr patch, Place 0.4 mg onto the skin daily., Disp: , Rfl:  .  nitroGLYCERIN (NITROSTAT) 0.4 MG SL tablet, Place 1 tablet (0.4 mg total) under the tongue every 5 (five) minutes as needed for chest pain., Disp: 15 tablet, Rfl: 2 .  pantoprazole (PROTONIX) 40 MG tablet, Take 1 tablet (40 mg total) by mouth daily at 12 noon., Disp: 30 tablet, Rfl: 1 .  ranolazine (RANEXA) 1000 MG SR tablet, Take 1 tablet (1,000 mg total) by mouth 2 (two)  times daily. (Patient taking differently: Take 500 mg by mouth 2 (two) times daily. ), Disp: 60 tablet, Rfl: 2 .  VYTORIN 10-20 MG per tablet, Take 1 tablet by mouth daily at 6 PM. , Disp: , Rfl:   Past Medical History: Past Medical History:  Diagnosis Date  . Anxiety   . Blood transfusion   . Diabetes mellitus    type II  . Heart murmur   . History of colon polyps   . Hyperlipidemia   . Hypertension   . MI (myocardial infarction) ([place])   . Mitral valve prolapse   . Osteoarthritis   . Osteoporosis   . Spinal stenosis   . Thyroid disease    hypothyroidism  . Venous insufficiency     Tobacco Use: History  Smoking Status  . Former Smoker  . Quit date: 03/10/2005  Smokeless Tobacco  . Never Used    Labs: Recent Review Flowsheet Data    Labs for ITP Cardiac and Pulmonary Rehab Latest Ref Rng & Units 05/18/2007 05/19/2007 04/18/2014 04/20/2014   Cholestrol 0 - 200 mg/dL - 135 ATP III CLASSIFICATION: <200     mg/dL   Desirable 200-239  mg/dL   Borderline High >=240    mg/dL   High 125 -   LDLCALC 0 - 99 mg/dL - 71 Total Cholesterol/HDL:CHD Risk Coronary Heart  Disease Risk Table Men   Women 1/2 Average Risk   3.4   3.3 74 -   HDL >39 mg/dL - 27(L) 38(L) -   Trlycerides <150 mg/dL - 186(H) 65 -   Hemoglobin A1c 4.8 - 5.6 % 6.3 (NOTE)   The ADA recommends the following therapeutic goals for glycemic   control related to Hgb A1C measurement:   Goal of Therapy:   < 7.0% Hgb A1C   Action Suggested:  > 8.0% Hgb A1C   Ref:  Diabetes Care, 22, Suppl. 1, 1999(H) - - 6.0(H)      Capillary Blood Glucose: Lab Results  Component Value Date   GLUCAP 144 (H) 07/03/2014   GLUCAP 124 (H) 06/30/2014   GLUCAP 132 (H) 06/28/2014   GLUCAP 105 (H) 06/26/2014   GLUCAP 121 (H) 06/23/2014     Exercise Target Goals: Date: 07/31/16  Exercise Program Goal: Individual exercise prescription set with THRR, safety & activity barriers. Participant demonstrates ability to understand and report RPE  using BORG scale, to self-measure pulse accurately, and to acknowledge the importance of the exercise prescription.  Exercise Prescription Goal: Starting with aerobic activity 30 plus minutes a day, 3 days per week for initial exercise prescription. Provide home exercise prescription and guidelines that participant acknowledges understanding prior to discharge.  Activity Barriers & Risk Stratification:     Activity Barriers & Cardiac Risk Stratification - 07/31/16 1103      Activity Barriers & Cardiac Risk Stratification   Activity Barriers Back Problems;Arthritis;Balance Concerns;Shortness of Breath;Muscular Weakness;Deconditioning;Other (comment)   Comments L hip pain   Cardiac Risk Stratification High      6 Minute Walk:     6 Minute Walk    Row Name 07/31/16 0944 07/31/16 1105       6 Minute Walk   Phase Initial  -    Distance 1200 feet  -    Walk Time 6 minutes  -    # of Rest Breaks 0  -    MPH 2.3  -    METS 2  -    RPE 14  -    VO2 Peak 7.2  -    Symptoms No  -    Resting HR 56 bpm  -    Resting BP 112/60  -    Max Ex. HR 72 bpm  -    Max Ex. BP 138/66  -    2 Minute Post BP  - 132/62       Oxygen Initial Assessment:   Oxygen Re-Evaluation:   Oxygen Discharge (Final Oxygen Re-Evaluation):   Initial Exercise Prescription:     Initial Exercise Prescription - 07/31/16 1100      Date of Initial Exercise RX and Referring Provider   Date 07/31/16   Referring Provider Kela Millin MD     Oxygen   Oxygen Continuous   Liters 2     Recumbant Bike   Level 2   Minutes 10   METs 1.7     NuStep   Level 2   SPM 70   Minutes 10   METs 2     Track   Laps 8   Minutes 10   METs 2.39     Prescription Details   Frequency (times per week) 3   Duration Progress to 30 minutes of continuous aerobic without signs/symptoms of physical distress     Intensity   THRR 40-80% of Max Heartrate 58-115   Ratings of Perceived Exertion  11-13   Perceived  Dyspnea 0-4     Progression   Progression Continue to progress workloads to maintain intensity without signs/symptoms of physical distress.     Resistance Training   Training Prescription Yes   Weight 2lbs   Reps 10-15      Perform Capillary Blood Glucose checks as needed.  Exercise Prescription Changes:   Exercise Comments:   Exercise Goals and Review:     Exercise Goals    Row Name 07/31/16 1101             Exercise Goals   Increase Physical Activity Yes       Intervention Provide advice, education, support and counseling about physical activity/exercise needs.;Develop an individualized exercise prescription for aerobic and resistive training based on initial evaluation findings, risk stratification, comorbidities and participant's personal goals.       Expected Outcomes Achievement of increased cardiorespiratory fitness and enhanced flexibility, muscular endurance and strength shown through measurements of functional capacity and personal statement of participant.       Increase Strength and Stamina Yes  improve symptoms of SOB with climbing stairs       Intervention Provide advice, education, support and counseling about physical activity/exercise needs.;Develop an individualized exercise prescription for aerobic and resistive training based on initial evaluation findings, risk stratification, comorbidities and participant's personal goals.       Expected Outcomes Achievement of increased cardiorespiratory fitness and enhanced flexibility, muscular endurance and strength shown through measurements of functional capacity and personal statement of participant.          Exercise Goals Re-Evaluation :    Discharge Exercise Prescription (Final Exercise Prescription Changes):   Nutrition:  Target Goals: Understanding of nutrition guidelines, daily intake of sodium 1500mg , cholesterol 200mg , calories 30% from fat and 7% or less from saturated fats, daily to have 5 or  more servings of fruits and vegetables.  Biometrics:     Pre Biometrics - 07/31/16 1102      Pre Biometrics   Height 5' 4.5" (1.638 m)   Weight 170 lb 13.7 oz (77.5 kg)   Waist Circumference 38.25 inches   Hip Circumference 42 inches   Waist to Hip Ratio 0.91 %   BMI (Calculated) 28.9   Triceps Skinfold 37 mm   % Body Fat 42.8 %   Grip Strength 28 kg   Flexibility 0 in  LBP!   Single Leg Stand 15 seconds       Nutrition Therapy Plan and Nutrition Goals:   Nutrition Discharge: Nutrition Scores:   Nutrition Goals Re-Evaluation:   Nutrition Goals Re-Evaluation:   Nutrition Goals Discharge (Final Nutrition Goals Re-Evaluation):   Psychosocial: Target Goals: Acknowledge presence or absence of significant depression and/or stress, maximize coping skills, provide positive support system. Participant is able to verbalize types and ability to use techniques and skills needed for reducing stress and depression.  Initial Review & Psychosocial Screening:     Initial Psych Review & Screening - 07/31/16 1651      Initial Review   Current issues with Current Depression;Current Anxiety/Panic  husband passes away 2 years ago. health related anxiety.     Family Dynamics   Good Support System? Yes     Barriers   Psychosocial barriers to participate in program The patient should benefit from training in stress management and relaxation.      Quality of Life Scores:     Quality of Life - 07/31/16 1048      Quality of Life  Scores   Health/Function Pre 10.29 %   Socioeconomic Pre 24.5 %   Psych/Spiritual Pre 18.64 %   Family Pre 24 %   GLOBAL Pre 16.94 %      PHQ-9: Recent Review Flowsheet Data    Depression screen [facility] 2/9 09/22/2014 06/12/2014   Decreased Interest 0 1    Down, Depressed, Hopeless 0 1    PHQ - 2 Score 0 2   Altered sleeping - 0   Tired, decreased energy - 3    Change in appetite - 0   Feeling bad or failure about yourself  - 0   Trouble  concentrating - 0   Moving slowly or fidgety/restless - 0   Suicidal thoughts - 0   PHQ-9 Score - 5   Difficult doing work/chores - Somewhat difficult     Interpretation of Total Score  Total Score Depression Severity:  1-4 = Minimal depression, 5-9 = Mild depression, 10-14 = Moderate depression, 15-19 = Moderately severe depression, 20-27 = Severe depression   Psychosocial Evaluation and Intervention:   Psychosocial Re-Evaluation:   Psychosocial Discharge (Final Psychosocial Re-Evaluation):   Vocational Rehabilitation: Provide vocational rehab assistance to qualifying candidates.   Vocational Rehab Evaluation & Intervention:     Vocational Rehab - 07/31/16 1654      Initial Vocational Rehab Evaluation & Intervention   Assessment shows need for Vocational Rehabilitation No      Education: Education Goals: Education classes will be provided on a weekly basis, covering required topics. Participant will state understanding/return demonstration of topics presented.  Learning Barriers/Preferences:     Learning Barriers/Preferences - 07/31/16 1104      Learning Barriers/Preferences   Learning Barriers Sight  cataract surgery   Learning Preferences Skilled Demonstration;Verbal Instruction;Written Material;Pictoral      Education Topics: Count Your Pulse:  -Group instruction provided by verbal instruction, demonstration, patient participation and written materials to support subject.  Instructors address importance of being able to find your pulse and how to count your pulse when at home without a heart monitor.  Patients get hands on experience counting their pulse with staff help and individually.   Heart Attack, Angina, and Risk Factor Modification:  -Group instruction provided by verbal instruction, video, and written materials to support subject.  Instructors address signs and symptoms of angina and heart attacks.    Also discuss risk factors for heart disease and  how to make changes to improve heart health risk factors.   Functional Fitness:  -Group instruction provided by verbal instruction, demonstration, patient participation, and written materials to support subject.  Instructors address safety measures for doing things around the house.  Discuss how to get up and down off the floor, how to pick things up properly, how to safely get out of a chair without assistance, and balance training.   Meditation and Mindfulness:  -Group instruction provided by verbal instruction, patient participation, and written materials to support subject.  Instructor addresses importance of mindfulness and meditation practice to help reduce stress and improve awareness.  Instructor also leads participants through a meditation exercise.    Stretching for Flexibility and Mobility:  -Group instruction provided by verbal instruction, patient participation, and written materials to support subject.  Instructors lead participants through series of stretches that are designed to increase flexibility thus improving mobility.  These stretches are additional exercise for major muscle groups that are typically performed during regular warm up and cool down.   Hands Only CPR:  -Group verbal, video, and participation  provides a basic overview of AHA guidelines for community CPR. Role-play of emergencies allow participants the opportunity to practice calling for help and chest compression technique with discussion of AED use.   Hypertension: -Group verbal and written instruction that provides a basic overview of hypertension including the most recent diagnostic guidelines, risk factor reduction with self-care instructions and medication management.    Nutrition I class: Heart Healthy Eating:  -Group instruction provided by PowerPoint slides, verbal discussion, and written materials to support subject matter. The instructor gives an explanation and review of the Therapeutic Lifestyle  Changes diet recommendations, which includes a discussion on lipid goals, dietary fat, sodium, fiber, plant stanol/sterol esters, sugar, and the components of a well-balanced, healthy diet.   Nutrition II class: Lifestyle Skills:  -Group instruction provided by PowerPoint slides, verbal discussion, and written materials to support subject matter. The instructor gives an explanation and review of label reading, grocery shopping for heart health, heart healthy recipe modifications, and ways to make healthier choices when eating out.   Diabetes Question & Answer:  -Group instruction provided by PowerPoint slides, verbal discussion, and written materials to support subject matter. The instructor gives an explanation and review of diabetes co-morbidities, pre- and post-prandial blood glucose goals, pre-exercise blood glucose goals, signs, symptoms, and treatment of hypoglycemia and hyperglycemia, and foot care basics.   Diabetes Blitz:  -Group instruction provided by PowerPoint slides, verbal discussion, and written materials to support subject matter. The instructor gives an explanation and review of the physiology behind type 1 and type 2 diabetes, diabetes medications and rational behind using different medications, pre- and post-prandial blood glucose recommendations and Hemoglobin A1c goals, diabetes diet, and exercise including blood glucose guidelines for exercising safely.    Portion Distortion:  -Group instruction provided by PowerPoint slides, verbal discussion, written materials, and food models to support subject matter. The instructor gives an explanation of serving size versus portion size, changes in portions sizes over the last 20 years, and what consists of a serving from each food group.   Stress Management:  -Group instruction provided by verbal instruction, video, and written materials to support subject matter.  Instructors review role of stress in heart disease and how to cope  with stress positively.     Exercising on Your Own:  -Group instruction provided by verbal instruction, power point, and written materials to support subject.  Instructors discuss benefits of exercise, components of exercise, frequency and intensity of exercise, and end points for exercise.  Also discuss use of nitroglycerin and activating EMS.  Review options of places to exercise outside of rehab.  Review guidelines for sex with heart disease.   Cardiac Drugs I:  -Group instruction provided by verbal instruction and written materials to support subject.  Instructor reviews cardiac drug classes: antiplatelets, anticoagulants, beta blockers, and statins.  Instructor discusses reasons, side effects, and lifestyle considerations for each drug class.   Cardiac Drugs II:  -Group instruction provided by verbal instruction and written materials to support subject.  Instructor reviews cardiac drug classes: angiotensin converting enzyme inhibitors (ACE-I), angiotensin II receptor blockers (ARBs), nitrates, and calcium channel blockers.  Instructor discusses reasons, side effects, and lifestyle considerations for each drug class.   Anatomy and Physiology of the Circulatory System:  Group verbal and written instruction and models provide basic cardiac anatomy and physiology, with the coronary electrical and arterial systems. Review of: AMI, Angina, Valve disease, Heart Failure, Peripheral Artery Disease, Cardiac Arrhythmia, Pacemakers, and the ICD.   Other Education:  -  Group or individual verbal, written, or video instructions that support the educational goals of the cardiac rehab program.   Knowledge Questionnaire Score:     Knowledge Questionnaire Score - 07/31/16 1034      Knowledge Questionnaire Score   Pre Score 24/24      Core Components/Risk Factors/Patient Goals at Admission:     Personal Goals and Risk Factors at Admission - 07/31/16 1048      Core Components/Risk  Factors/Patient Goals on Admission    Weight Management Yes;Weight Maintenance;Weight Loss   Intervention Weight Management: Develop a combined nutrition and exercise program designed to reach desired caloric intake, while maintaining appropriate intake of nutrient and fiber, sodium and fats, and appropriate energy expenditure required for the weight goal.;Weight Management: Provide education and appropriate resources to help participant work on and attain dietary goals.;Weight Management/Obesity: Establish reasonable short term and long term weight goals.   Expected Outcomes Short Term: Continue to assess and modify interventions until short term weight is achieved;Long Term: Adherence to nutrition and physical activity/exercise program aimed toward attainment of established weight goal;Weight Maintenance: Understanding of the daily nutrition guidelines, which includes 25-35% calories from fat, 7% or less cal from saturated fats, less than 200mg  cholesterol, less than 1.5gm of sodium, & 5 or more servings of fruits and vegetables daily;Weight Loss: Understanding of general recommendations for a balanced deficit meal plan, which promotes 1-2 lb weight loss per week and includes a negative energy balance of [phone] kcal/d;Understanding recommendations for meals to include 15-35% energy as protein, 25-35% energy from fat, 35-60% energy from carbohydrates, less than 200mg  of dietary cholesterol, 20-35 gm of total fiber daily;Understanding of distribution of calorie intake throughout the day with the consumption of 4-5 meals/snacks   Improve shortness of breath with ADL's Yes   Intervention Provide education, individualized exercise plan and daily activity instruction to help decrease symptoms of SOB with activities of daily living.   Expected Outcomes Short Term: Achieves a reduction of symptoms when performing activities of daily living.   Diabetes Yes   Intervention Provide education about signs/symptoms and  action to take for hypo/hyperglycemia.;Provide education about proper nutrition, including hydration, and aerobic/resistive exercise prescription along with prescribed medications to achieve blood glucose in normal ranges: Fasting glucose 65-99 mg/dL   Expected Outcomes Short Term: Participant verbalizes understanding of the signs/symptoms and immediate care of hyper/hypoglycemia, proper foot care and importance of medication, aerobic/resistive exercise and nutrition plan for blood glucose control.;Long Term: Attainment of HbA1C < 7%.   Hypertension Yes   Intervention Provide education on lifestyle modifcations including regular physical activity/exercise, weight management, moderate sodium restriction and increased consumption of fresh fruit, vegetables, and low fat dairy, alcohol moderation, and smoking cessation.;Monitor prescription use compliance.   Expected Outcomes Short Term: Continued assessment and intervention until BP is < 140/35mm HG in hypertensive participants. < 130/40mm HG in hypertensive participants with diabetes, heart failure or chronic kidney disease.;Long Term: Maintenance of blood pressure at goal levels.   Lipids Yes   Intervention Provide education and support for participant on nutrition & aerobic/resistive exercise along with prescribed medications to achieve LDL 70mg , HDL >40mg .   Expected Outcomes Short Term: Participant states understanding of desired cholesterol values and is compliant with medications prescribed. Participant is following exercise prescription and nutrition guidelines.;Long Term: Cholesterol controlled with medications as prescribed, with individualized exercise RX and with personalized nutrition plan. Value goals: LDL < 70mg , HDL > 40 mg.   Stress Yes   Intervention Offer individual and/or small  group education and counseling on adjustment to heart disease, stress management and health-related lifestyle change. Teach and support self-help strategies.;Refer  participants experiencing significant psychosocial distress to appropriate mental health specialists for further evaluation and treatment. When possible, include family members and significant others in education/counseling sessions.   Expected Outcomes Short Term: Participant demonstrates changes in health-related behavior, relaxation and other stress management skills, ability to obtain effective social support, and compliance with psychotropic medications if prescribed.;Long Term: Emotional wellbeing is indicated by absence of clinically significant psychosocial distress or social isolation.      Core Components/Risk Factors/Patient Goals Review:    Core Components/Risk Factors/Patient Goals at Discharge (Final Review):    ITP Comments:     ITP Comments    Row Name 07/31/16 0936           ITP Comments Medical Director, Dr. Fransico Him          Comments:  Patient attended orientation from 0800 to 1030 to review rules and guidelines for program. Completed 6 minute walk test, Intitial ITP, and exercise prescription.  VSS. Telemetry-SR. Pt is well known to rehab staff from previous participation.   Pt used oxygen therapy at [facility] during her walk test and will plan to use during exercise.  This greatly helped pt angina symptoms due to small vessel disease. Brief Psychosocial Assessment - reveals no immediate barriers to participating in cardiac rehab.  Pt is accompanied by her daughter who drives for her.  Pt admits to some lingering depression due to the loss of her husband 2 years ago. Generally pt is optimistic and positive.   Pt mostly asymptomatic with walk test. Pt had some dull cp that was "bearable" and went away with rest.  Pt with some shortness of breath due to deconditioning and maintain o2 sat 100%.  Pt is looking forward to returning back to cardiac rehab. Cherre Huger, BSN Cardiac and Training and development officer

## 2016-07-31 NOTE — Progress Notes (Signed)
Cardiac Rehab Medication Review by a Pharmacist  Does the patient  feel that his/her medications are working for him/her?  yes  Has the patient been experiencing any side effects to the medications prescribed?  no  Does the patient measure his/her own blood pressure or blood glucose at home?  yes   Does the patient have any problems obtaining medications due to transportation or finances?   no  Understanding of regimen: good Understanding of indications: good Potential of compliance: fair    Pharmacist comments: DW is a 3 yoW presenting in good spirits. She states she is approximately 2 years out of her cath for which she is being medically managed. Overall, she has a good understanding of what she takes and denies any side effects. She does state she gets 'sweaty feelings' at least once per week when she gets upset or does anything physically strenuous. When this occurs she uses one nitro tab which seems to relieve the issue. She is hesitant about cardiac rehab, but I told her it is a good opportunity to build some strength and get out of her house for a bit, to which she agreed.    Desiree Leon, Cain Sieve, PharmD Clinical Pharmacy Resident [phone] (Pager) 07/31/2016 8:46 AM

## 2016-07-31 NOTE — Telephone Encounter (Signed)
Spoke to Dr. Einar Gip.  Okay for pt to use 2-3 liters of oxygen therapy with exercise for angina.  Faxed agreement to his office for signature. Cherre Huger, BSN Cardiac and Training and development officer

## 2016-08-01 ENCOUNTER — Ambulatory Visit (HOSPITAL_COMMUNITY): Payer: Medicare Other

## 2016-08-08 ENCOUNTER — Encounter (HOSPITAL_COMMUNITY)
Admission: RE | Admit: 2016-08-08 | Discharge: 2016-08-08 | Disposition: A | Discharge status: Home / Self Care | Payer: Medicare Other | Source: Ambulatory Visit | Attending: Cardiology | Admitting: Cardiology

## 2016-08-08 ENCOUNTER — Ambulatory Visit (HOSPITAL_COMMUNITY): Payer: Medicare Other

## 2016-08-08 DIAGNOSIS — I872 Venous insufficiency (chronic) (peripheral): Secondary | ICD-10-CM | POA: Insufficient documentation

## 2016-08-08 DIAGNOSIS — M1990 Unspecified osteoarthritis, unspecified site: Secondary | ICD-10-CM | POA: Insufficient documentation

## 2016-08-08 DIAGNOSIS — I208 Other forms of angina pectoris: Secondary | ICD-10-CM | POA: Diagnosis not present

## 2016-08-08 DIAGNOSIS — I252 Old myocardial infarction: Secondary | ICD-10-CM | POA: Diagnosis not present

## 2016-08-08 DIAGNOSIS — I10 Essential (primary) hypertension: Secondary | ICD-10-CM | POA: Diagnosis not present

## 2016-08-08 DIAGNOSIS — F419 Anxiety disorder, unspecified: Secondary | ICD-10-CM | POA: Insufficient documentation

## 2016-08-08 DIAGNOSIS — Z7902 Long term (current) use of antithrombotics/antiplatelets: Secondary | ICD-10-CM | POA: Diagnosis not present

## 2016-08-08 DIAGNOSIS — E119 Type 2 diabetes mellitus without complications: Secondary | ICD-10-CM | POA: Insufficient documentation

## 2016-08-08 DIAGNOSIS — Z87891 Personal history of nicotine dependence: Secondary | ICD-10-CM | POA: Diagnosis not present

## 2016-08-08 DIAGNOSIS — Z79899 Other long term (current) drug therapy: Secondary | ICD-10-CM | POA: Insufficient documentation

## 2016-08-08 DIAGNOSIS — E785 Hyperlipidemia, unspecified: Secondary | ICD-10-CM | POA: Diagnosis not present

## 2016-08-08 DIAGNOSIS — E039 Hypothyroidism, unspecified: Secondary | ICD-10-CM | POA: Diagnosis not present

## 2016-08-08 DIAGNOSIS — M810 Age-related osteoporosis without current pathological fracture: Secondary | ICD-10-CM | POA: Diagnosis not present

## 2016-08-08 DIAGNOSIS — Z7982 Long term (current) use of aspirin: Secondary | ICD-10-CM | POA: Diagnosis not present

## 2016-08-08 LAB — GLUCOSE, CAPILLARY: Glucose-Capillary: 117 mg/dL — ABNORMAL HIGH (ref 65–99)

## 2016-08-08 NOTE — Progress Notes (Signed)
Daily Session Note  Patient Details  Name: Desiree Leon MRN: [id number] Date of Birth: Jan 29, 1941 Referring Provider:     CARDIAC REHAB PHASE II ORIENTATION from 07/31/2016 in [facility]  Referring Provider  Kela Millin MD      Encounter Date: 08/08/2016  Check In:     Session Check In - 08/08/16 1114      Check-In   Location [facility]   Staff Present Luetta Nutting Fair, MS, ACSM RCEP, Exercise Physiologist;Masha Orbach, RN, [facility], MS, ACSM RCEP, Exercise Physiologist;Maria Whitaker, RN, BSN;Joann Rion, RN, BSN   Supervising physician immediately available to respond to emergencies [facility] immediately available   Physician(s) Dr. Broadus John   Medication changes reported     No   Fall or balance concerns reported    No   Tobacco Cessation No Change   Warm-up and Cool-down Performed as group-led instruction   Resistance Training Performed Yes   VAD Patient? No     Pain Assessment   Currently in Pain? No/denies   Multiple Pain Sites No      Capillary Blood Glucose: Results for orders placed or performed during the hospital encounter of 08/08/16 (from the past 24 hour(s))  Glucose, capillary     Status: Abnormal   Collection Time: 08/08/16 11:33 AM  Result Value Ref Range   Glucose-Capillary 117 (H) 65 - 99 mg/dL      History  Smoking Status  . Former Smoker  . Quit date: 03/10/2005  Smokeless Tobacco  . Never Used    Goals Met:  Exercise tolerated well Personal goals reviewed  Goals Unmet:  Not Applicable  Comments:  Pt started full exercise in phase II cardiac rehab today.  Pt tolerated light exercise without difficulty. VSS, telemetry-SR with sinus arrythmia, asymptomatic. Pt using oxygen therapy as management for her angina.  Medication list reconciled. Pt denies barriers to medication compliance.  PSYCHOSOCIAL ASSESSMENT:  PHQ-1. Pt exhibits positive coping skills, hopeful outlook with  supportive family. Pt completed pre assessment quality of life.  Pt scored the following     Quality of Life - 07/31/16 1048      Quality of Life Scores   Health/Function Pre 10.29 %   Socioeconomic Pre 24.5 %   Psych/Spiritual Pre 18.64 %   Family Pre 24 %   GLOBAL Pre 16.94 %    Pt scored low in the areas of health and function. Pt attributes this to her ongoing angina.  Pt is able to tolerate her angina and knows when she needs to seek medical attention. Pt uses oxygen therapy at rehab to alleviate chest heaviness during exertion. Pt daughter brought her to exercise today. No psychosocial needs identified at this time, no psychosocial interventions necessary.    Pt enjoys watching TV and reading.   Pt oriented to exercise equipment and routine.  Pt desires to be able to improve energy and shortness of breath.  Pt would like to be able to climb stairs without feeling short of breath.  Long term goal is to increase stamina and strength.  Understanding verbalized.  Dr. Fransico Him is Medical Director for Cardiac Rehab at [facility].

## 2016-08-11 ENCOUNTER — Encounter (HOSPITAL_COMMUNITY)
Admission: RE | Admit: 2016-08-11 | Discharge: 2016-08-11 | Disposition: A | Discharge status: Home / Self Care | Payer: Medicare Other | Source: Ambulatory Visit | Attending: Cardiology | Admitting: Cardiology

## 2016-08-11 ENCOUNTER — Ambulatory Visit (HOSPITAL_COMMUNITY): Payer: Medicare Other

## 2016-08-11 DIAGNOSIS — I208 Other forms of angina pectoris: Secondary | ICD-10-CM | POA: Diagnosis not present

## 2016-08-12 DIAGNOSIS — R0600 Dyspnea, unspecified: Secondary | ICD-10-CM | POA: Insufficient documentation

## 2016-08-15 ENCOUNTER — Ambulatory Visit (HOSPITAL_COMMUNITY): Payer: Medicare Other

## 2016-08-15 ENCOUNTER — Encounter (HOSPITAL_COMMUNITY)
Admission: RE | Admit: 2016-08-15 | Discharge: 2016-08-15 | Disposition: A | Discharge status: Home / Self Care | Payer: Medicare Other | Source: Ambulatory Visit | Attending: Cardiology | Admitting: Cardiology

## 2016-08-15 DIAGNOSIS — I208 Other forms of angina pectoris: Secondary | ICD-10-CM | POA: Diagnosis not present

## 2016-08-18 ENCOUNTER — Ambulatory Visit (HOSPITAL_COMMUNITY): Payer: Medicare Other

## 2016-08-18 ENCOUNTER — Encounter (HOSPITAL_COMMUNITY)
Admission: RE | Admit: 2016-08-18 | Discharge: 2016-08-18 | Disposition: A | Discharge status: Home / Self Care | Payer: Medicare Other | Source: Ambulatory Visit | Attending: Cardiology | Admitting: Cardiology

## 2016-08-18 DIAGNOSIS — I208 Other forms of angina pectoris: Secondary | ICD-10-CM | POA: Diagnosis not present

## 2016-08-18 NOTE — Progress Notes (Signed)
Reviewed home exercise with pt today.  Pt plans to walk at [employer] and other box stores for exercise.  Reviewed THR, pulse, RPE, sign and symptoms, NTG use, and when to call 911 or MD.  Also discussed weather considerations and indoor options.  Pt voiced understanding.

## 2016-08-20 ENCOUNTER — Encounter (HOSPITAL_COMMUNITY): Admission: RE | Admit: 2016-08-20 | Payer: Medicare Other | Source: Ambulatory Visit

## 2016-08-22 ENCOUNTER — Ambulatory Visit (HOSPITAL_COMMUNITY): Payer: Medicare Other

## 2016-08-22 ENCOUNTER — Encounter (HOSPITAL_COMMUNITY)
Admission: RE | Admit: 2016-08-22 | Discharge: 2016-08-22 | Disposition: A | Discharge status: Home / Self Care | Payer: Medicare Other | Source: Ambulatory Visit | Attending: Cardiology | Admitting: Cardiology

## 2016-08-22 DIAGNOSIS — I208 Other forms of angina pectoris: Secondary | ICD-10-CM

## 2016-08-22 NOTE — Progress Notes (Signed)
During the third station while on the nustep.  Pt had stabbing pain in her chest.  Pt attributes this to her angina symptoms.  Pt had two pains that quickly went away.  Pt deals with chronic angina.  Asked her about her morning.  Pt stated that she was upset about her grandson.  Pt feels the grandson may have autism.  Pt began to cry.  Increased oxygen therapy 3lnc .offered pt emotional and spiritual counseling.  Pt thanked rehab staff for sharing a personal experience story with her.  Pt is going to the beach next week. Cherre Huger, BSN Cardiac and Training and development officer

## 2016-08-25 ENCOUNTER — Encounter (HOSPITAL_COMMUNITY): Payer: Medicare Other

## 2016-08-25 ENCOUNTER — Ambulatory Visit (HOSPITAL_COMMUNITY): Payer: Medicare Other

## 2016-08-26 NOTE — Progress Notes (Signed)
Cardiac Individual Treatment Plan  Patient Details  Name: Desiree Leon MRN: [id number] Date of Birth: 1941/03/04 Referring Provider:     CARDIAC REHAB PHASE II ORIENTATION from 07/31/2016 in [place]  Referring Provider  Kela Millin MD      Initial Encounter Date:    CARDIAC REHAB PHASE II ORIENTATION from 07/31/2016 in [place]  Date  07/31/16  Referring Provider  Kela Millin MD      Visit Diagnosis: Stable angina [facility])  Patient's Home Medications on Admission:  Current Outpatient Prescriptions:  .  acetaminophen (TYLENOL) 500 MG tablet, Take 500 mg by mouth every 6 (six) hours as needed for fever or headache. , Disp: , Rfl:  .  ALPRAZolam (XANAX) 0.25 MG tablet, Take 0.125 mg by mouth at bedtime as needed for anxiety. , Disp: , Rfl:  .  aspirin 81 MG chewable tablet, Chew 81 mg by mouth daily., Disp: , Rfl:  .  clopidogrel (PLAVIX) 75 MG tablet, Take 1 tablet (75 mg total) by mouth daily with breakfast., Disp: 30 tablet, Rfl: 1 .  escitalopram (LEXAPRO) 10 MG tablet, Take 5 mg by mouth daily., Disp: , Rfl:  .  levothyroxine (SYNTHROID, LEVOTHROID) 150 MCG tablet, Take 150 mcg by mouth daily before breakfast. , Disp: , Rfl:  .  metoprolol tartrate (LOPRESSOR) 25 MG tablet, Take 25 mg by mouth 2 (two) times daily. , Disp: , Rfl:  .  niCARdipine (CARDENE) 20 MG capsule, Take 20 mg by mouth every evening. , Disp: , Rfl:  .  nitroGLYCERIN (NITRODUR - DOSED IN MG/24 HR) 0.4 mg/hr patch, Place 0.4 mg onto the skin daily., Disp: , Rfl:  .  nitroGLYCERIN (NITROSTAT) 0.4 MG SL tablet, Place 1 tablet (0.4 mg total) under the tongue every 5 (five) minutes as needed for chest pain., Disp: 15 tablet, Rfl: 2 .  pantoprazole (PROTONIX) 40 MG tablet, Take 1 tablet (40 mg total) by mouth daily at 12 noon., Disp: 30 tablet, Rfl: 1 .  ranolazine (RANEXA) 1000 MG SR tablet, Take 1 tablet (1,000 mg total) by mouth 2 (two)  times daily. (Patient taking differently: Take 500 mg by mouth 2 (two) times daily. ), Disp: 60 tablet, Rfl: 2 .  VYTORIN 10-20 MG per tablet, Take 1 tablet by mouth daily at 6 PM. , Disp: , Rfl:   Past Medical History: Past Medical History:  Diagnosis Date  . Anxiety   . Blood transfusion   . Diabetes mellitus    type II  . Heart murmur   . History of colon polyps   . Hyperlipidemia   . Hypertension   . MI (myocardial infarction) ([place])   . Mitral valve prolapse   . Osteoarthritis   . Osteoporosis   . Spinal stenosis   . Thyroid disease    hypothyroidism  . Venous insufficiency     Tobacco Use: History  Smoking Status  . Former Smoker  . Quit date: 03/10/2005  Smokeless Tobacco  . Never Used    Labs: Recent Review Flowsheet Data    Labs for ITP Cardiac and Pulmonary Rehab Latest Ref Rng & Units 05/18/2007 05/19/2007 04/18/2014 04/20/2014   Cholestrol 0 - 200 mg/dL - 135 ATP III CLASSIFICATION: <200     mg/dL   Desirable 200-239  mg/dL   Borderline High >=240    mg/dL   High 125 -   LDLCALC 0 - 99 mg/dL - 71 Total Cholesterol/HDL:CHD Risk Coronary Heart  Disease Risk Table Men   Women 1/2 Average Risk   3.4   3.3 74 -   HDL >39 mg/dL - 27(L) 38(L) -   Trlycerides <150 mg/dL - 186(H) 65 -   Hemoglobin A1c 4.8 - 5.6 % 6.3 (NOTE)   The ADA recommends the following therapeutic goals for glycemic   control related to Hgb A1C measurement:   Goal of Therapy:   < 7.0% Hgb A1C   Action Suggested:  > 8.0% Hgb A1C   Ref:  Diabetes Care, 22, Suppl. 1, 1999(H) - - 6.0(H)      Capillary Blood Glucose: Lab Results  Component Value Date   GLUCAP 117 (H) 08/08/2016   GLUCAP 144 (H) 07/03/2014   GLUCAP 124 (H) 06/30/2014   GLUCAP 132 (H) 06/28/2014   GLUCAP 105 (H) 06/26/2014     Exercise Target Goals:    Exercise Program Goal: Individual exercise prescription set with THRR, safety & activity barriers. Participant demonstrates ability to understand and report RPE using BORG  scale, to self-measure pulse accurately, and to acknowledge the importance of the exercise prescription.  Exercise Prescription Goal: Starting with aerobic activity 30 plus minutes a day, 3 days per week for initial exercise prescription. Provide home exercise prescription and guidelines that participant acknowledges understanding prior to discharge.  Activity Barriers & Risk Stratification:     Activity Barriers & Cardiac Risk Stratification - 07/31/16 1103      Activity Barriers & Cardiac Risk Stratification   Activity Barriers Back Problems;Arthritis;Balance Concerns;Shortness of Breath;Muscular Weakness;Deconditioning;Other (comment)   Comments L hip pain   Cardiac Risk Stratification High      6 Minute Walk:     6 Minute Walk    Row Name 07/31/16 0944 07/31/16 1105       6 Minute Walk   Phase Initial  -    Distance 1200 feet  -    Walk Time 6 minutes  -    # of Rest Breaks 0  -    MPH 2.3  -    METS 2  -    RPE 14  -    VO2 Peak 7.2  -    Symptoms No  -    Resting HR 56 bpm  -    Resting BP 112/60  -    Max Ex. HR 72 bpm  -    Max Ex. BP 138/66  -    2 Minute Post BP  - 132/62       Oxygen Initial Assessment:   Oxygen Re-Evaluation:   Oxygen Discharge (Final Oxygen Re-Evaluation):   Initial Exercise Prescription:     Initial Exercise Prescription - 07/31/16 1100      Date of Initial Exercise RX and Referring Provider   Date 07/31/16   Referring Provider Kela Millin MD     Oxygen   Oxygen Continuous   Liters 2     Recumbant Bike   Level 2   Minutes 10   METs 1.7     NuStep   Level 2   SPM 70   Minutes 10   METs 2     Track   Laps 8   Minutes 10   METs 2.39     Prescription Details   Frequency (times per week) 3   Duration Progress to 30 minutes of continuous aerobic without signs/symptoms of physical distress     Intensity   THRR 40-80% of Max Heartrate 58-115   Ratings of Perceived Exertion  11-13   Perceived Dyspnea 0-4      Progression   Progression Continue to progress workloads to maintain intensity without signs/symptoms of physical distress.     Resistance Training   Training Prescription Yes   Weight 2lbs   Reps 10-15      Perform Capillary Blood Glucose checks as needed.  Exercise Prescription Changes:      Exercise Prescription Changes    Row Name 08/12/16 1300 08/18/16 1100           Response to Exercise   Blood Pressure (Admit) 122/60  -      Blood Pressure (Exercise) 122/70  -      Blood Pressure (Exit) 112/72  -      Heart Rate (Admit) 61 bpm  -      Heart Rate (Exercise) 81 bpm  -      Heart Rate (Exit) 61 bpm  -      Rating of Perceived Exertion (Exercise) 13  -      Symptoms none  -      Duration Progress to 30 minutes of  aerobic without signs/symptoms of physical distress  -      Intensity THRR unchanged  -        Resistance Training   Training Prescription No  -        Oxygen   Oxygen Continuous  -      Liters 2  -        Recumbant Bike   Level 2  -      Minutes 10  -      METs 1.7  -        NuStep   Level 2  -      SPM 70  -      Minutes 10  -      METs 2.1  -        Track   Laps 7  -      Minutes 10  -      METs 2.23  -        Home Exercise Plan   Plans to continue exercise at  [facility] (comment)      Frequency  - Add 3 additional days to program exercise sessions.         Exercise Comments:      Exercise Comments    Row Name 08/18/16 1157           Exercise Comments Home exercise completed          Exercise Goals and Review:      Exercise Goals    Row Name 07/31/16 1101             Exercise Goals   Increase Physical Activity Yes       Intervention Provide advice, education, support and counseling about physical activity/exercise needs.;Develop an individualized exercise prescription for aerobic and resistive training based on initial evaluation findings, risk stratification, comorbidities and participant's  personal goals.       Expected Outcomes Achievement of increased cardiorespiratory fitness and enhanced flexibility, muscular endurance and strength shown through measurements of functional capacity and personal statement of participant.       Increase Strength and Stamina Yes  improve symptoms of SOB with climbing stairs       Intervention Provide advice, education, support and counseling about physical activity/exercise needs.;Develop an individualized exercise prescription for aerobic and resistive training based on initial evaluation findings, risk  stratification, comorbidities and participant's personal goals.       Expected Outcomes Achievement of increased cardiorespiratory fitness and enhanced flexibility, muscular endurance and strength shown through measurements of functional capacity and personal statement of participant.          Exercise Goals Re-Evaluation :     Exercise Goals Re-Evaluation    Row Name 08/18/16 1611 08/26/16 1610           Exercise Goal Re-Evaluation   Exercise Goals Review  - Increase Physical Activity;Increase Strenth and Stamina      Comments Reviewed home exercise with pt today.  Pt plans to walk at [employer] and other box stores for exercise.  Reviewed THR, pulse, RPE, sign and symptoms, NTG use, and when to call 911 or MD.  Also discussed weather considerations and indoor options.  Pt voiced understanding.  -      Expected Outcomes Pt will be compliant with HEP and build on walking tolerance and overall cardiorespiratory fitness  -          Discharge Exercise Prescription (Final Exercise Prescription Changes):     Exercise Prescription Changes - 08/18/16 1100      Home Exercise Plan   Plans to continue exercise at [facility] (comment)   Frequency Add 3 additional days to program exercise sessions.      Nutrition:  Target Goals: Understanding of nutrition guidelines, daily intake of sodium 1500mg , cholesterol 200mg , calories 30% from  fat and 7% or less from saturated fats, daily to have 5 or more servings of fruits and vegetables.  Biometrics:     Pre Biometrics - 07/31/16 1102      Pre Biometrics   Height 5' 4.5" (1.638 m)   Weight 170 lb 13.7 oz (77.5 kg)   Waist Circumference 38.25 inches   Hip Circumference 42 inches   Waist to Hip Ratio 0.91 %   BMI (Calculated) 28.9   Triceps Skinfold 37 mm   % Body Fat 42.8 %   Grip Strength 28 kg   Flexibility 0 in  LBP!   Single Leg Stand 15 seconds       Nutrition Therapy Plan and Nutrition Goals:   Nutrition Discharge: Nutrition Scores:   Nutrition Goals Re-Evaluation:   Nutrition Goals Re-Evaluation:   Nutrition Goals Discharge (Final Nutrition Goals Re-Evaluation):   Psychosocial: Target Goals: Acknowledge presence or absence of significant depression and/or stress, maximize coping skills, provide positive support system. Participant is able to verbalize types and ability to use techniques and skills needed for reducing stress and depression.  Initial Review & Psychosocial Screening:     Initial Psych Review & Screening - 07/31/16 1651      Initial Review   Current issues with Current Depression;Current Anxiety/Panic  husband passes away 2 years ago. health related anxiety.     Family Dynamics   Good Support System? Yes     Barriers   Psychosocial barriers to participate in program The patient should benefit from training in stress management and relaxation.      Quality of Life Scores:     Quality of Life - 07/31/16 1048      Quality of Life Scores   Health/Function Pre 10.29 %   Socioeconomic Pre 24.5 %   Psych/Spiritual Pre 18.64 %   Family Pre 24 %   GLOBAL Pre 16.94 %      PHQ-9: Recent Review Flowsheet Data    Depression screen [facility] 2/9 08/08/2016 09/22/2014 06/12/2014   Decreased Interest  0 0 1    Down, Depressed, Hopeless 1 0 1    PHQ - 2 Score 1 0 2   Altered sleeping - - 0   Tired, decreased energy - - 3    Change  in appetite - - 0   Feeling bad or failure about yourself  - - 0   Trouble concentrating - - 0   Moving slowly or fidgety/restless - - 0   Suicidal thoughts - - 0   PHQ-9 Score - - 5   Difficult doing work/chores - - Somewhat difficult     Interpretation of Total Score  Total Score Depression Severity:  1-4 = Minimal depression, 5-9 = Mild depression, 10-14 = Moderate depression, 15-19 = Moderately severe depression, 20-27 = Severe depression   Psychosocial Evaluation and Intervention:     Psychosocial Evaluation - 08/26/16 0037      Psychosocial Evaluation & Interventions   Interventions Relaxation education;Stress management education;Encouraged to exercise with the program and follow exercise prescription   Continue Psychosocial Services  Follow up required by staff      Psychosocial Re-Evaluation:     Psychosocial Re-Evaluation    [place] Name 08/26/16 0037             Psychosocial Re-Evaluation   Current issues with Current Stress Concerns       Comments Pt worries about her grandson who she thinks may have autism       Interventions Relaxation education;Stress management education;Encouraged to attend Cardiac Rehabilitation for the exercise       Continue Psychosocial Services  Follow up required by staff          Psychosocial Discharge (Final Psychosocial Re-Evaluation):     Psychosocial Re-Evaluation - 08/26/16 0037      Psychosocial Re-Evaluation   Current issues with Current Stress Concerns   Comments Pt worries about her grandson who she thinks may have autism   Interventions Relaxation education;Stress management education;Encouraged to attend Cardiac Rehabilitation for the exercise   Continue Psychosocial Services  Follow up required by staff      Vocational Rehabilitation: Provide vocational rehab assistance to qualifying candidates.   Vocational Rehab Evaluation & Intervention:     Vocational Rehab - 07/31/16 1654      Initial Vocational Rehab  Evaluation & Intervention   Assessment shows need for Vocational Rehabilitation No      Education: Education Goals: Education classes will be provided on a weekly basis, covering required topics. Participant will state understanding/return demonstration of topics presented.  Learning Barriers/Preferences:     Learning Barriers/Preferences - 07/31/16 1104      Learning Barriers/Preferences   Learning Barriers Sight  cataract surgery   Learning Preferences Skilled Demonstration;Verbal Instruction;Written Material;Pictoral      Education Topics: Count Your Pulse:  -Group instruction provided by verbal instruction, demonstration, patient participation and written materials to support subject.  Instructors address importance of being able to find your pulse and how to count your pulse when at home without a heart monitor.  Patients get hands on experience counting their pulse with staff help and individually.   CARDIAC REHAB PHASE II EXERCISE from 08/22/2016 in [place]  Date  08/08/16  Instruction Review Code  2- meets goals/outcomes      Heart Attack, Angina, and Risk Factor Modification:  -Group instruction provided by verbal instruction, video, and written materials to support subject.  Instructors address signs and symptoms of angina and heart attacks.  Also discuss risk factors for heart disease and how to make changes to improve heart health risk factors.   Functional Fitness:  -Group instruction provided by verbal instruction, demonstration, patient participation, and written materials to support subject.  Instructors address safety measures for doing things around the house.  Discuss how to get up and down off the floor, how to pick things up properly, how to safely get out of a chair without assistance, and balance training.   CARDIAC REHAB PHASE II EXERCISE from 08/22/2016 in [place]  Date  08/22/16   Instruction Review Code  2- meets goals/outcomes      Meditation and Mindfulness:  -Group instruction provided by verbal instruction, patient participation, and written materials to support subject.  Instructor addresses importance of mindfulness and meditation practice to help reduce stress and improve awareness.  Instructor also leads participants through a meditation exercise.    Stretching for Flexibility and Mobility:  -Group instruction provided by verbal instruction, patient participation, and written materials to support subject.  Instructors lead participants through series of stretches that are designed to increase flexibility thus improving mobility.  These stretches are additional exercise for major muscle groups that are typically performed during regular warm up and cool down.   Hands Only CPR:  -Group verbal, video, and participation provides a basic overview of AHA guidelines for community CPR. Role-play of emergencies allow participants the opportunity to practice calling for help and chest compression technique with discussion of AED use.   Hypertension: -Group verbal and written instruction that provides a basic overview of hypertension including the most recent diagnostic guidelines, risk factor reduction with self-care instructions and medication management.   CARDIAC REHAB PHASE II EXERCISE from 08/22/2016 in [place]  Date  08/15/16  Instruction Review Code  2- meets goals/outcomes       Nutrition I class: Heart Healthy Eating:  -Group instruction provided by PowerPoint slides, verbal discussion, and written materials to support subject matter. The instructor gives an explanation and review of the Therapeutic Lifestyle Changes diet recommendations, which includes a discussion on lipid goals, dietary fat, sodium, fiber, plant stanol/sterol esters, sugar, and the components of a well-balanced, healthy diet.   Nutrition II class:  Lifestyle Skills:  -Group instruction provided by PowerPoint slides, verbal discussion, and written materials to support subject matter. The instructor gives an explanation and review of label reading, grocery shopping for heart health, heart healthy recipe modifications, and ways to make healthier choices when eating out.   Diabetes Question & Answer:  -Group instruction provided by PowerPoint slides, verbal discussion, and written materials to support subject matter. The instructor gives an explanation and review of diabetes co-morbidities, pre- and post-prandial blood glucose goals, pre-exercise blood glucose goals, signs, symptoms, and treatment of hypoglycemia and hyperglycemia, and foot care basics.   Diabetes Blitz:  -Group instruction provided by PowerPoint slides, verbal discussion, and written materials to support subject matter. The instructor gives an explanation and review of the physiology behind type 1 and type 2 diabetes, diabetes medications and rational behind using different medications, pre- and post-prandial blood glucose recommendations and Hemoglobin A1c goals, diabetes diet, and exercise including blood glucose guidelines for exercising safely.    Portion Distortion:  -Group instruction provided by PowerPoint slides, verbal discussion, written materials, and food models to support subject matter. The instructor gives an explanation of serving size versus portion size, changes in portions sizes over the last 20 years, and what consists  of a serving from each food group.   Stress Management:  -Group instruction provided by verbal instruction, video, and written materials to support subject matter.  Instructors review role of stress in heart disease and how to cope with stress positively.     Exercising on Your Own:  -Group instruction provided by verbal instruction, power point, and written materials to support subject.  Instructors discuss benefits of exercise, components  of exercise, frequency and intensity of exercise, and end points for exercise.  Also discuss use of nitroglycerin and activating EMS.  Review options of places to exercise outside of rehab.  Review guidelines for sex with heart disease.   Cardiac Drugs I:  -Group instruction provided by verbal instruction and written materials to support subject.  Instructor reviews cardiac drug classes: antiplatelets, anticoagulants, beta blockers, and statins.  Instructor discusses reasons, side effects, and lifestyle considerations for each drug class.   Cardiac Drugs II:  -Group instruction provided by verbal instruction and written materials to support subject.  Instructor reviews cardiac drug classes: angiotensin converting enzyme inhibitors (ACE-I), angiotensin II receptor blockers (ARBs), nitrates, and calcium channel blockers.  Instructor discusses reasons, side effects, and lifestyle considerations for each drug class.   Anatomy and Physiology of the Circulatory System:  Group verbal and written instruction and models provide basic cardiac anatomy and physiology, with the coronary electrical and arterial systems. Review of: AMI, Angina, Valve disease, Heart Failure, Peripheral Artery Disease, Cardiac Arrhythmia, Pacemakers, and the ICD.   Other Education:  -Group or individual verbal, written, or video instructions that support the educational goals of the cardiac rehab program.   Knowledge Questionnaire Score:     Knowledge Questionnaire Score - 07/31/16 1034      Knowledge Questionnaire Score   Pre Score 24/24      Core Components/Risk Factors/Patient Goals at Admission:     Personal Goals and Risk Factors at Admission - 07/31/16 1048      Core Components/Risk Factors/Patient Goals on Admission    Weight Management Yes;Weight Maintenance;Weight Loss   Intervention Weight Management: Develop a combined nutrition and exercise program designed to reach desired caloric intake, while  maintaining appropriate intake of nutrient and fiber, sodium and fats, and appropriate energy expenditure required for the weight goal.;Weight Management: Provide education and appropriate resources to help participant work on and attain dietary goals.;Weight Management/Obesity: Establish reasonable short term and long term weight goals.   Expected Outcomes Short Term: Continue to assess and modify interventions until short term weight is achieved;Long Term: Adherence to nutrition and physical activity/exercise program aimed toward attainment of established weight goal;Weight Maintenance: Understanding of the daily nutrition guidelines, which includes 25-35% calories from fat, 7% or less cal from saturated fats, less than 200mg  cholesterol, less than 1.5gm of sodium, & 5 or more servings of fruits and vegetables daily;Weight Loss: Understanding of general recommendations for a balanced deficit meal plan, which promotes 1-2 lb weight loss per week and includes a negative energy balance of [phone] kcal/d;Understanding recommendations for meals to include 15-35% energy as protein, 25-35% energy from fat, 35-60% energy from carbohydrates, less than 200mg  of dietary cholesterol, 20-35 gm of total fiber daily;Understanding of distribution of calorie intake throughout the day with the consumption of 4-5 meals/snacks   Improve shortness of breath with ADL's Yes   Intervention Provide education, individualized exercise plan and daily activity instruction to help decrease symptoms of SOB with activities of daily living.   Expected Outcomes Short Term: Achieves a reduction of symptoms when  performing activities of daily living.   Diabetes Yes   Intervention Provide education about signs/symptoms and action to take for hypo/hyperglycemia.;Provide education about proper nutrition, including hydration, and aerobic/resistive exercise prescription along with prescribed medications to achieve blood glucose in normal ranges:  Fasting glucose 65-99 mg/dL   Expected Outcomes Short Term: Participant verbalizes understanding of the signs/symptoms and immediate care of hyper/hypoglycemia, proper foot care and importance of medication, aerobic/resistive exercise and nutrition plan for blood glucose control.;Long Term: Attainment of HbA1C < 7%.   Hypertension Yes   Intervention Provide education on lifestyle modifcations including regular physical activity/exercise, weight management, moderate sodium restriction and increased consumption of fresh fruit, vegetables, and low fat dairy, alcohol moderation, and smoking cessation.;Monitor prescription use compliance.   Expected Outcomes Short Term: Continued assessment and intervention until BP is < 140/20mm HG in hypertensive participants. < 130/58mm HG in hypertensive participants with diabetes, heart failure or chronic kidney disease.;Long Term: Maintenance of blood pressure at goal levels.   Lipids Yes   Intervention Provide education and support for participant on nutrition & aerobic/resistive exercise along with prescribed medications to achieve LDL 70mg , HDL >40mg .   Expected Outcomes Short Term: Participant states understanding of desired cholesterol values and is compliant with medications prescribed. Participant is following exercise prescription and nutrition guidelines.;Long Term: Cholesterol controlled with medications as prescribed, with individualized exercise RX and with personalized nutrition plan. Value goals: LDL < 70mg , HDL > 40 mg.   Stress Yes   Intervention Offer individual and/or small group education and counseling on adjustment to heart disease, stress management and health-related lifestyle change. Teach and support self-help strategies.;Refer participants experiencing significant psychosocial distress to appropriate mental health specialists for further evaluation and treatment. When possible, include family members and significant others in education/counseling  sessions.   Expected Outcomes Short Term: Participant demonstrates changes in health-related behavior, relaxation and other stress management skills, ability to obtain effective social support, and compliance with psychotropic medications if prescribed.;Long Term: Emotional wellbeing is indicated by absence of clinically significant psychosocial distress or social isolation.      Core Components/Risk Factors/Patient Goals Review:      Goals and Risk Factor Review    Row Name 08/26/16 0036             Core Components/Risk Factors/Patient Goals Review   Personal Goals Review Weight Management/Obesity;Improve shortness of breath with ADL's;Develop more efficient breathing techniques such as purse lipped breathing and diaphragmatic breathing and practicing self-pacing with activity.;Lipids;Hypertension;Stress;Diabetes       Review Pt is off to a good start after completing 6 sessions.         Expected Outcomes Maintain healthy weight, improve sob with ADL's, lipid values, diabetes is diet controled, bp within normal limits and positive and healthy coping skills.           Core Components/Risk Factors/Patient Goals at Discharge (Final Review):      Goals and Risk Factor Review - 08/26/16 0036      Core Components/Risk Factors/Patient Goals Review   Personal Goals Review Weight Management/Obesity;Improve shortness of breath with ADL's;Develop more efficient breathing techniques such as purse lipped breathing and diaphragmatic breathing and practicing self-pacing with activity.;Lipids;Hypertension;Stress;Diabetes   Review Pt is off to a good start after completing 6 sessions.     Expected Outcomes Maintain healthy weight, improve sob with ADL's, lipid values, diabetes is diet controled, bp within normal limits and positive and healthy coping skills.       ITP Comments:  ITP Comments    Row Name 07/31/16 0936           ITP Comments Medical Director, Dr. Fransico Him           Comments:  Pt is making expected progress toward personal goals after completing sessions. Pt with improvement of anginal attacks. Pt has had only one brief episode during exercise. Pt tried to get oxygen therapy to use when she is out and about but did not quality because medicare would not pay since her o2 sats did not desat with exercise. Psychosocial Assessment - reveals no immediate barriers to participating in cardiac rehab.  Pt is accompanied by her daughter who drives for her.  Pt admits to some lingering depression due to the loss of her husband 2 years ago. Generally pt is optimistic and positive however pt with recent worry that he grandson may be autistic.  Pt given lots of support and encouragement.  Pt is out this week at the beach with family.  Recommend continued exercise and life style modification education including  stress management and relaxation techniques to decrease cardiac risk profile. Cherre Huger, BSN Cardiac and Training and development officer

## 2016-08-29 ENCOUNTER — Ambulatory Visit (HOSPITAL_COMMUNITY): Payer: Medicare Other

## 2016-08-29 ENCOUNTER — Encounter (HOSPITAL_COMMUNITY): Admission: RE | Admit: 2016-08-29 | Payer: Medicare Other | Source: Ambulatory Visit

## 2016-09-01 ENCOUNTER — Ambulatory Visit (HOSPITAL_COMMUNITY): Payer: Medicare Other

## 2016-09-01 ENCOUNTER — Encounter (HOSPITAL_COMMUNITY)
Admission: RE | Admit: 2016-09-01 | Discharge: 2016-09-01 | Disposition: A | Discharge status: Home / Self Care | Payer: Medicare Other | Source: Ambulatory Visit | Attending: Cardiology | Admitting: Cardiology

## 2016-09-01 DIAGNOSIS — I208 Other forms of angina pectoris: Secondary | ICD-10-CM | POA: Diagnosis not present

## 2016-09-05 ENCOUNTER — Encounter (HOSPITAL_COMMUNITY)
Admission: RE | Admit: 2016-09-05 | Discharge: 2016-09-05 | Disposition: A | Discharge status: Home / Self Care | Payer: Medicare Other | Source: Ambulatory Visit | Attending: Cardiology | Admitting: Cardiology

## 2016-09-05 ENCOUNTER — Ambulatory Visit (HOSPITAL_COMMUNITY): Payer: Medicare Other

## 2016-09-05 DIAGNOSIS — I208 Other forms of angina pectoris: Secondary | ICD-10-CM

## 2016-09-08 ENCOUNTER — Ambulatory Visit (HOSPITAL_COMMUNITY): Payer: Medicare Other

## 2016-09-08 ENCOUNTER — Encounter (HOSPITAL_COMMUNITY)
Admission: RE | Admit: 2016-09-08 | Discharge: 2016-09-08 | Disposition: A | Discharge status: Home / Self Care | Payer: Medicare Other | Source: Ambulatory Visit | Attending: Cardiology | Admitting: Cardiology

## 2016-09-08 DIAGNOSIS — Z79899 Other long term (current) drug therapy: Secondary | ICD-10-CM | POA: Insufficient documentation

## 2016-09-08 DIAGNOSIS — Z7902 Long term (current) use of antithrombotics/antiplatelets: Secondary | ICD-10-CM | POA: Insufficient documentation

## 2016-09-08 DIAGNOSIS — E039 Hypothyroidism, unspecified: Secondary | ICD-10-CM | POA: Insufficient documentation

## 2016-09-08 DIAGNOSIS — I208 Other forms of angina pectoris: Secondary | ICD-10-CM | POA: Diagnosis not present

## 2016-09-08 DIAGNOSIS — I252 Old myocardial infarction: Secondary | ICD-10-CM | POA: Insufficient documentation

## 2016-09-08 DIAGNOSIS — Z87891 Personal history of nicotine dependence: Secondary | ICD-10-CM | POA: Diagnosis not present

## 2016-09-08 DIAGNOSIS — M810 Age-related osteoporosis without current pathological fracture: Secondary | ICD-10-CM | POA: Insufficient documentation

## 2016-09-08 DIAGNOSIS — F419 Anxiety disorder, unspecified: Secondary | ICD-10-CM | POA: Insufficient documentation

## 2016-09-08 DIAGNOSIS — I10 Essential (primary) hypertension: Secondary | ICD-10-CM | POA: Insufficient documentation

## 2016-09-08 DIAGNOSIS — I872 Venous insufficiency (chronic) (peripheral): Secondary | ICD-10-CM | POA: Insufficient documentation

## 2016-09-08 DIAGNOSIS — E785 Hyperlipidemia, unspecified: Secondary | ICD-10-CM | POA: Diagnosis not present

## 2016-09-08 DIAGNOSIS — E119 Type 2 diabetes mellitus without complications: Secondary | ICD-10-CM | POA: Insufficient documentation

## 2016-09-08 DIAGNOSIS — M1990 Unspecified osteoarthritis, unspecified site: Secondary | ICD-10-CM | POA: Insufficient documentation

## 2016-09-08 DIAGNOSIS — Z7982 Long term (current) use of aspirin: Secondary | ICD-10-CM | POA: Diagnosis not present

## 2016-09-08 DIAGNOSIS — I2089 Other forms of angina pectoris: Secondary | ICD-10-CM

## 2016-09-12 ENCOUNTER — Ambulatory Visit (HOSPITAL_COMMUNITY): Payer: Medicare Other

## 2016-09-12 ENCOUNTER — Encounter (HOSPITAL_COMMUNITY)
Admission: RE | Admit: 2016-09-12 | Discharge: 2016-09-12 | Disposition: A | Discharge status: Home / Self Care | Payer: Medicare Other | Source: Ambulatory Visit | Attending: Cardiology | Admitting: Cardiology

## 2016-09-12 DIAGNOSIS — I208 Other forms of angina pectoris: Secondary | ICD-10-CM

## 2016-09-15 ENCOUNTER — Encounter (HOSPITAL_COMMUNITY)
Admission: RE | Admit: 2016-09-15 | Discharge: 2016-09-15 | Disposition: A | Discharge status: Home / Self Care | Payer: Medicare Other | Source: Ambulatory Visit | Attending: Cardiology | Admitting: Cardiology

## 2016-09-15 ENCOUNTER — Ambulatory Visit (HOSPITAL_COMMUNITY): Payer: Medicare Other

## 2016-09-15 DIAGNOSIS — I208 Other forms of angina pectoris: Secondary | ICD-10-CM

## 2016-09-15 NOTE — Progress Notes (Signed)
Desiree Leon 76 y.o. female Nutrition Note Spoke with pt. Pt known to me from previous admission. Nutrition Plan and Nutrition Survey goals reviewed with pt. Pt is following Step 2 of the Therapeutic Lifestyle Changes diet. Pt wants to lose wt. Wt loss tips reviewed.  Pt is a diet-controlled diabetic. No recent A1c noted. Pt states her last A1c was "5.4 or 5.5." This writer went over Diabetes Education test results. Pt checks CBG's once daily. Fasting CBG's reportedly 112-135 mg/dL. Pt reports she eats 2 meals and drinks a Premier protein shake as a meal replacement once daily. Pt expressed understanding of the information reviewed. Pt aware of nutrition education classes offered. Pt declined to attend nutrition classes due to transportation issues. Pt declined to receive nutrition class handouts because "I still have the class handouts from last time."   Wt Readings from Last 3 Encounters:  07/31/16 170 lb 13.7 oz (77.5 kg)  06/08/14 164 lb 3.9 oz (74.5 kg)  05/08/14 161 lb 13.1 oz (73.4 kg)    Nutrition Diagnosis ? Food-and nutrition-related knowledge deficit related to lack of exposure to information as related to diagnosis of: ? CVD ? DM ? Overweight related to excessive energy intake as evidenced by a BMI of 28.9  Nutrition Intervention ? Pt's individual nutrition plan reviewed with pt. ? Benefits of adopting Therapeutic Lifestyle Changes discussed when Medficts reviewed.   ? Continue client-centered nutrition education by RD, as part of interdisciplinary care. Goal(s) ? Pt to identify food quantities necessary to achieve weight loss of 6-10 lb at graduation from cardiac rehab.   Derek Mound, M.Ed, RD, LDN, CDE 09/15/2016 1:41 PM

## 2016-09-19 ENCOUNTER — Encounter (HOSPITAL_COMMUNITY)
Admission: RE | Admit: 2016-09-19 | Discharge: 2016-09-19 | Disposition: A | Discharge status: Home / Self Care | Payer: Medicare Other | Source: Ambulatory Visit | Attending: Cardiology | Admitting: Cardiology

## 2016-09-19 ENCOUNTER — Ambulatory Visit (HOSPITAL_COMMUNITY): Payer: Medicare Other

## 2016-09-19 DIAGNOSIS — I208 Other forms of angina pectoris: Secondary | ICD-10-CM

## 2016-09-21 NOTE — Progress Notes (Signed)
Cardiac Individual Treatment Plan  Patient Details  Name: Desiree Leon MRN: [id number] Date of Birth: 1941/03/04 Referring Provider:     CARDIAC REHAB PHASE II ORIENTATION from 07/31/2016 in [place]  Referring Provider  Kela Millin MD      Initial Encounter Date:    CARDIAC REHAB PHASE II ORIENTATION from 07/31/2016 in [place]  Date  07/31/16  Referring Provider  Kela Millin MD      Visit Diagnosis: Stable angina [facility])  Patient's Home Medications on Admission:  Current Outpatient Prescriptions:  .  acetaminophen (TYLENOL) 500 MG tablet, Take 500 mg by mouth every 6 (six) hours as needed for fever or headache. , Disp: , Rfl:  .  ALPRAZolam (XANAX) 0.25 MG tablet, Take 0.125 mg by mouth at bedtime as needed for anxiety. , Disp: , Rfl:  .  aspirin 81 MG chewable tablet, Chew 81 mg by mouth daily., Disp: , Rfl:  .  clopidogrel (PLAVIX) 75 MG tablet, Take 1 tablet (75 mg total) by mouth daily with breakfast., Disp: 30 tablet, Rfl: 1 .  escitalopram (LEXAPRO) 10 MG tablet, Take 5 mg by mouth daily., Disp: , Rfl:  .  levothyroxine (SYNTHROID, LEVOTHROID) 150 MCG tablet, Take 150 mcg by mouth daily before breakfast. , Disp: , Rfl:  .  metoprolol tartrate (LOPRESSOR) 25 MG tablet, Take 25 mg by mouth 2 (two) times daily. , Disp: , Rfl:  .  niCARdipine (CARDENE) 20 MG capsule, Take 20 mg by mouth every evening. , Disp: , Rfl:  .  nitroGLYCERIN (NITRODUR - DOSED IN MG/24 HR) 0.4 mg/hr patch, Place 0.4 mg onto the skin daily., Disp: , Rfl:  .  nitroGLYCERIN (NITROSTAT) 0.4 MG SL tablet, Place 1 tablet (0.4 mg total) under the tongue every 5 (five) minutes as needed for chest pain., Disp: 15 tablet, Rfl: 2 .  pantoprazole (PROTONIX) 40 MG tablet, Take 1 tablet (40 mg total) by mouth daily at 12 noon., Disp: 30 tablet, Rfl: 1 .  ranolazine (RANEXA) 1000 MG SR tablet, Take 1 tablet (1,000 mg total) by mouth 2 (two)  times daily. (Patient taking differently: Take 500 mg by mouth 2 (two) times daily. ), Disp: 60 tablet, Rfl: 2 .  VYTORIN 10-20 MG per tablet, Take 1 tablet by mouth daily at 6 PM. , Disp: , Rfl:   Past Medical History: Past Medical History:  Diagnosis Date  . Anxiety   . Blood transfusion   . Diabetes mellitus    type II  . Heart murmur   . History of colon polyps   . Hyperlipidemia   . Hypertension   . MI (myocardial infarction) ([place])   . Mitral valve prolapse   . Osteoarthritis   . Osteoporosis   . Spinal stenosis   . Thyroid disease    hypothyroidism  . Venous insufficiency     Tobacco Use: History  Smoking Status  . Former Smoker  . Quit date: 03/10/2005  Smokeless Tobacco  . Never Used    Labs: Recent Review Flowsheet Data    Labs for ITP Cardiac and Pulmonary Rehab Latest Ref Rng & Units 05/18/2007 05/19/2007 04/18/2014 04/20/2014   Cholestrol 0 - 200 mg/dL - 135 ATP III CLASSIFICATION: <200     mg/dL   Desirable 200-239  mg/dL   Borderline High >=240    mg/dL   High 125 -   LDLCALC 0 - 99 mg/dL - 71 Total Cholesterol/HDL:CHD Risk Coronary Heart  Disease Risk Table Men   Women 1/2 Average Risk   3.4   3.3 74 -   HDL >39 mg/dL - 27(L) 38(L) -   Trlycerides <150 mg/dL - 186(H) 65 -   Hemoglobin A1c 4.8 - 5.6 % 6.3 (NOTE)   The ADA recommends the following therapeutic goals for glycemic   control related to Hgb A1C measurement:   Goal of Therapy:   < 7.0% Hgb A1C   Action Suggested:  > 8.0% Hgb A1C   Ref:  Diabetes Care, 22, Suppl. 1, 1999(H) - - 6.0(H)      Capillary Blood Glucose: Lab Results  Component Value Date   GLUCAP 117 (H) 08/08/2016   GLUCAP 144 (H) 07/03/2014   GLUCAP 124 (H) 06/30/2014   GLUCAP 132 (H) 06/28/2014   GLUCAP 105 (H) 06/26/2014     Exercise Target Goals:    Exercise Program Goal: Individual exercise prescription set with THRR, safety & activity barriers. Participant demonstrates ability to understand and report RPE using BORG  scale, to self-measure pulse accurately, and to acknowledge the importance of the exercise prescription.  Exercise Prescription Goal: Starting with aerobic activity 30 plus minutes a day, 3 days per week for initial exercise prescription. Provide home exercise prescription and guidelines that participant acknowledges understanding prior to discharge.  Activity Barriers & Risk Stratification:     Activity Barriers & Cardiac Risk Stratification - 07/31/16 1103      Activity Barriers & Cardiac Risk Stratification   Activity Barriers Back Problems;Arthritis;Balance Concerns;Shortness of Breath;Muscular Weakness;Deconditioning;Other (comment)   Comments L hip pain   Cardiac Risk Stratification High      6 Minute Walk:     6 Minute Walk    Row Name 07/31/16 0944 07/31/16 1105       6 Minute Walk   Phase Initial  -    Distance 1200 feet  -    Walk Time 6 minutes  -    # of Rest Breaks 0  -    MPH 2.3  -    METS 2  -    RPE 14  -    VO2 Peak 7.2  -    Symptoms No  -    Resting HR 56 bpm  -    Resting BP 112/60  -    Max Ex. HR 72 bpm  -    Max Ex. BP 138/66  -    2 Minute Post BP  - 132/62       Oxygen Initial Assessment:   Oxygen Re-Evaluation:   Oxygen Discharge (Final Oxygen Re-Evaluation):   Initial Exercise Prescription:     Initial Exercise Prescription - 07/31/16 1100      Date of Initial Exercise RX and Referring Provider   Date 07/31/16   Referring Provider Kela Millin MD     Oxygen   Oxygen Continuous   Liters 2     Recumbant Bike   Level 2   Minutes 10   METs 1.7     NuStep   Level 2   SPM 70   Minutes 10   METs 2     Track   Laps 8   Minutes 10   METs 2.39     Prescription Details   Frequency (times per week) 3   Duration Progress to 30 minutes of continuous aerobic without signs/symptoms of physical distress     Intensity   THRR 40-80% of Max Heartrate 58-115   Ratings of Perceived Exertion  11-13   Perceived Dyspnea 0-4      Progression   Progression Continue to progress workloads to maintain intensity without signs/symptoms of physical distress.     Resistance Training   Training Prescription Yes   Weight 2lbs   Reps 10-15      Perform Capillary Blood Glucose checks as needed.  Exercise Prescription Changes:      Exercise Prescription Changes    Row Name 08/12/16 1300 08/18/16 1100 09/05/16 1433 09/22/16 1400       Response to Exercise   Blood Pressure (Admit) 122/60  - 104/60 122/60    Blood Pressure (Exercise) 122/70  - 144/70 122/70    Blood Pressure (Exit) 112/72  - 122/68 112/70    Heart Rate (Admit) 61 bpm  - 57 bpm 60 bpm    Heart Rate (Exercise) 81 bpm  - 101 bpm 84 bpm    Heart Rate (Exit) 61 bpm  - 59 bpm 60 bpm    Rating of Perceived Exertion (Exercise) 13  - 12 13    Symptoms none  - none none    Duration Progress to 30 minutes of  aerobic without signs/symptoms of physical distress  - Continue with 30 min of aerobic exercise without signs/symptoms of physical distress. Continue with 30 min of aerobic exercise without signs/symptoms of physical distress.    Intensity THRR unchanged  - THRR unchanged THRR unchanged      Progression   Progression  -  - Continue to progress workloads to maintain intensity without signs/symptoms of physical distress. Continue to progress workloads to maintain intensity without signs/symptoms of physical distress.    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 Add 3 additional days to program exercise sessions. Add 3 additional days to program exercise sessions.    Initial Home Exercises Provided  -  - 08/18/16 08/18/16       Exercise Comments:      Exercise Comments    Row Name 08/18/16 1157 09/22/16 1439         Exercise Comments Home exercise completed Reviewed METs and goals. Pt is tolerating exercise well; pt will continue to monitor pt's progress.         Exercise Goals  and Review:      Exercise Goals    Row Name 07/31/16 1101             Exercise Goals   Increase Physical Activity Yes       Intervention Provide advice, education, support and counseling about physical activity/exercise needs.;Develop an individualized exercise prescription for aerobic and resistive training based on initial evaluation findings, risk stratification, comorbidities and participant's personal goals.       Expected Outcomes Achievement of increased cardiorespiratory fitness and enhanced flexibility, muscular endurance and strength shown through measurements of functional capacity and personal statement of participant.       Increase Strength and Stamina Yes  improve symptoms of SOB with climbing stairs       Intervention Provide advice, education, support and counseling about physical activity/exercise needs.;Develop an individualized exercise prescription for aerobic and resistive training based on initial evaluation findings, risk stratification, comorbidities and participant's personal goals.       Expected Outcomes Achievement of increased cardiorespiratory fitness and enhanced flexibility, muscular endurance and strength shown through measurements of functional capacity and personal statement of participant.          Exercise Goals  Re-Evaluation :     Exercise Goals Re-Evaluation    Row Name 08/18/16 1611 08/26/16 1610 09/22/16 1438         Exercise Goal Re-Evaluation   Exercise Goals Review  - Increase Physical Activity;Increase Strenth and Stamina Increase Physical Activity;Increase Strenth and Stamina     Comments Reviewed home exercise with pt today.  Pt plans to walk at [employer] and other box stores for exercise.  Reviewed THR, pulse, RPE, sign and symptoms, NTG use, and when to call 911 or MD.  Also discussed weather considerations and indoor options.  Pt voiced understanding.  - Pt is tolerating WL increases very well. Pt has also increased in walking tolerance and is doing well with HEP.     Expected Outcomes Pt will be compliant with HEP and build on walking tolerance and overall cardiorespiratory fitness  - Pt will be compliant with HEP and build on walking tolerance and overall cardiorespiratory fitness         Discharge Exercise Prescription (Final Exercise Prescription Changes):     Exercise Prescription Changes - 09/22/16 1400      Response to Exercise   Blood Pressure (Admit) 122/60   Blood Pressure (Exercise) 122/70   Blood Pressure (Exit) 112/70   Heart Rate (Admit) 60 bpm   Heart Rate (Exercise) 84 bpm   Heart Rate (Exit) 60 bpm   Rating of Perceived Exertion (Exercise) 13   Symptoms none   Duration Continue with 30 min of aerobic exercise without signs/symptoms of physical distress.   Intensity THRR unchanged     Progression   Progression Continue to progress workloads to maintain intensity without signs/symptoms of physical distress.   Average METs 2.5     Resistance Training   Training Prescription Yes   Weight 3lbs   Reps 10-15   Time 10 Minutes     Oxygen   Oxygen Continuous   Liters 2     Recumbant Bike   Level 2   Minutes 10   METs 2     NuStep   Level 3   SPM 80   Minutes 10   METs 2.3     Track   Laps 10   Minutes 10   METs 2.74     Home Exercise Plan  Plans to continue exercise at [facility] (comment)   Frequency Add 3 additional days to program exercise sessions.   Initial Home Exercises Provided 08/18/16      Nutrition:  Target Goals: Understanding of nutrition guidelines, daily intake of sodium 1500mg , cholesterol 200mg , calories 30% from fat and 7% or less from saturated fats, daily to have 5 or more servings of fruits and vegetables.  Biometrics:     Pre Biometrics - 07/31/16 1102      Pre Biometrics   Height 5' 4.5" (1.638 m)   Weight 170 lb 13.7 oz (77.5 kg)   Waist Circumference 38.25 inches   Hip Circumference 42 inches   Waist to Hip Ratio 0.91 %   BMI (Calculated) 28.9   Triceps Skinfold 37 mm   % Body Fat 42.8 %   Grip Strength 28 kg   Flexibility 0 in  LBP!   Single Leg Stand 15 seconds       Nutrition Therapy Plan and Nutrition Goals:     Nutrition Therapy & Goals - 09/15/16 1347      Nutrition Therapy   Diet Carb Modified, Therapeutic Lifestyle Changes     Personal Nutrition Goals   Nutrition Goal Wt loss of 1-2 lb/week to a wt loss goal of 6-10 lb at graduation from [place], educate and counsel regarding individualized specific dietary modifications aiming towards targeted core components such as weight, hypertension, lipid management, diabetes, heart failure and other comorbidities.   Expected Outcomes Short Term Goal: Understand basic principles of dietary content, such as calories, fat, sodium, cholesterol and nutrients.;Long Term Goal: Adherence to prescribed nutrition plan.      Nutrition Discharge: Nutrition Scores:     Nutrition Assessments - 09/15/16 1347      MEDFICTS Scores   Pre Score 3      Nutrition Goals Re-Evaluation:   Nutrition Goals Re-Evaluation:   Nutrition Goals Discharge (Final Nutrition Goals Re-Evaluation):   Psychosocial: Target Goals: Acknowledge presence or absence of significant depression  and/or stress, maximize coping skills, provide positive support system. Participant is able to verbalize types and ability to use techniques and skills needed for reducing stress and depression.  Initial Review & Psychosocial Screening:     Initial Psych Review & Screening - 07/31/16 1651      Initial Review   Current issues with Current Depression;Current Anxiety/Panic  husband passes away 2 years ago. health related anxiety.     Family Dynamics   Good Support System? Yes     Barriers   Psychosocial barriers to participate in program The patient should benefit from training in stress management and relaxation.      Quality of Life Scores:     Quality of Life - 07/31/16 1048      Quality of Life Scores   Health/Function Pre 10.29 %   Socioeconomic Pre 24.5 %   Psych/Spiritual Pre 18.64 %   Family Pre 24 %   GLOBAL Pre 16.94 %      PHQ-9: Recent Review Flowsheet Data    Depression screen [facility] 2/9 08/08/2016 09/22/2014 06/12/2014   Decreased Interest 0 0 1    Down, Depressed, Hopeless 1 0 1    PHQ - 2 Score 1 0 2   Altered sleeping - - 0   Tired, decreased energy - - 3    Change in appetite - - 0   Feeling bad or failure about yourself  - -  0   Trouble concentrating - - 0   Moving slowly or fidgety/restless - - 0   Suicidal thoughts - - 0   PHQ-9 Score - - 5   Difficult doing work/chores - - Somewhat difficult     Interpretation of Total Score  Total Score Depression Severity:  1-4 = Minimal depression, 5-9 = Mild depression, 10-14 = Moderate depression, 15-19 = Moderately severe depression, 20-27 = Severe depression   Psychosocial Evaluation and Intervention:     Psychosocial Evaluation - 09/21/16 1640      Psychosocial Evaluation & Interventions   Interventions Relaxation education;Stress management education;Encouraged to exercise with the program and follow exercise prescription   Comments pt remarks improvement of her outlook on life.  pt is suported by her  family.   Continue Psychosocial Services  Follow up required by staff      Psychosocial Re-Evaluation:     Psychosocial Re-Evaluation    [place] Name 08/26/16 0037 09/21/16 1641           Psychosocial Re-Evaluation   Current issues with Current Stress Concerns Current Stress Concerns      Comments Pt worries about her grandson who she thinks may have autism Improvement on how she is coping with her grandson diagnosis.  Pt is limited within the home due to oxygen therapy in home based and pt does not qualify for oxygen use outside the home due to normal o2 sat readings      Interventions Relaxation education;Stress management education;Encouraged to attend Cardiac Rehabilitation for the exercise Relaxation education;Stress management education;Encouraged to attend Cardiac Rehabilitation for the exercise      Continue Psychosocial Services  Follow up required by staff Follow up required by staff        Initial Review   Source of Stress Concerns  - Family;Unable to participate in former interests or hobbies;Unable to perform yard/household activities         Psychosocial Discharge (Final Psychosocial Re-Evaluation):     Psychosocial Re-Evaluation - 09/21/16 1641      Psychosocial Re-Evaluation   Current issues with Current Stress Concerns   Comments Improvement on how she is coping with her grandson diagnosis.  Pt is limited within the home due to oxygen therapy in home based and pt does not qualify for oxygen use outside the home due to normal o2 sat readings   Interventions Relaxation education;Stress management education;Encouraged to attend Cardiac Rehabilitation for the exercise   Continue Psychosocial Services  Follow up required by staff     Initial Review   Source of Stress Concerns Family;Unable to participate in former interests or hobbies;Unable to perform yard/household activities      Vocational Rehabilitation: Provide vocational rehab assistance to qualifying  candidates.   Vocational Rehab Evaluation & Intervention:     Vocational Rehab - 07/31/16 1654      Initial Vocational Rehab Evaluation & Intervention   Assessment shows need for Vocational Rehabilitation No      Education: Education Goals: Education classes will be provided on a weekly basis, covering required topics. Participant will state understanding/return demonstration of topics presented.  Learning Barriers/Preferences:     Learning Barriers/Preferences - 07/31/16 1104      Learning Barriers/Preferences   Learning Barriers Sight  cataract surgery   Learning Preferences Skilled Demonstration;Verbal Instruction;Written Material;Pictoral      Education Topics: Count Your Pulse:  -Group instruction provided by verbal instruction, demonstration, patient participation and written materials to support subject.  Instructors address importance of being  able to find your pulse and how to count your pulse when at home without a heart monitor.  Patients get hands on experience counting their pulse with staff help and individually.   CARDIAC REHAB PHASE II EXERCISE from 09/19/2016 in [place]  Date  09/12/16  Instruction Review Code  2- meets goals/outcomes      Heart Attack, Angina, and Risk Factor Modification:  -Group instruction provided by verbal instruction, video, and written materials to support subject.  Instructors address signs and symptoms of angina and heart attacks.    Also discuss risk factors for heart disease and how to make changes to improve heart health risk factors.   Functional Fitness:  -Group instruction provided by verbal instruction, demonstration, patient participation, and written materials to support subject.  Instructors address safety measures for doing things around the house.  Discuss how to get up and down off the floor, how to pick things up properly, how to safely get out of a chair without assistance, and  balance training.   CARDIAC REHAB PHASE II EXERCISE from 09/19/2016 in [place]  Date  08/22/16  Instruction Review Code  2- meets goals/outcomes      Meditation and Mindfulness:  -Group instruction provided by verbal instruction, patient participation, and written materials to support subject.  Instructor addresses importance of mindfulness and meditation practice to help reduce stress and improve awareness.  Instructor also leads participants through a meditation exercise.    Stretching for Flexibility and Mobility:  -Group instruction provided by verbal instruction, patient participation, and written materials to support subject.  Instructors lead participants through series of stretches that are designed to increase flexibility thus improving mobility.  These stretches are additional exercise for major muscle groups that are typically performed during regular warm up and cool down.   Hands Only CPR:  -Group verbal, video, and participation provides a basic overview of AHA guidelines for community CPR. Role-play of emergencies allow participants the opportunity to practice calling for help and chest compression technique with discussion of AED use.   Hypertension: -Group verbal and written instruction that provides a basic overview of hypertension including the most recent diagnostic guidelines, risk factor reduction with self-care instructions and medication management.   CARDIAC REHAB PHASE II EXERCISE from 09/19/2016 in [place]  Date  08/15/16  Instruction Review Code  2- meets goals/outcomes       Nutrition I class: Heart Healthy Eating:  -Group instruction provided by PowerPoint slides, verbal discussion, and written materials to support subject matter. The instructor gives an explanation and review of the Therapeutic Lifestyle Changes diet recommendations, which includes a discussion on lipid goals, dietary fat,  sodium, fiber, plant stanol/sterol esters, sugar, and the components of a well-balanced, healthy diet.   Nutrition II class: Lifestyle Skills:  -Group instruction provided by PowerPoint slides, verbal discussion, and written materials to support subject matter. The instructor gives an explanation and review of label reading, grocery shopping for heart health, heart healthy recipe modifications, and ways to make healthier choices when eating out.   Diabetes Question & Answer:  -Group instruction provided by PowerPoint slides, verbal discussion, and written materials to support subject matter. The instructor gives an explanation and review of diabetes co-morbidities, pre- and post-prandial blood glucose goals, pre-exercise blood glucose goals, signs, symptoms, and treatment of hypoglycemia and hyperglycemia, and foot care basics.   CARDIAC REHAB PHASE II EXERCISE from 09/19/2016 in [place]  [place]  Date  09/19/16  Educator  RD  Instruction Review Code  2- meets goals/outcomes      Diabetes Blitz:  -Group instruction provided by PowerPoint slides, verbal discussion, and written materials to support subject matter. The instructor gives an explanation and review of the physiology behind type 1 and type 2 diabetes, diabetes medications and rational behind using different medications, pre- and post-prandial blood glucose recommendations and Hemoglobin A1c goals, diabetes diet, and exercise including blood glucose guidelines for exercising safely.    Portion Distortion:  -Group instruction provided by PowerPoint slides, verbal discussion, written materials, and food models to support subject matter. The instructor gives an explanation of serving size versus portion size, changes in portions sizes over the last 20 years, and what consists of a serving from each food group.   Stress Management:  -Group instruction provided by verbal instruction, video, and written materials  to support subject matter.  Instructors review role of stress in heart disease and how to cope with stress positively.     Exercising on Your Own:  -Group instruction provided by verbal instruction, power point, and written materials to support subject.  Instructors discuss benefits of exercise, components of exercise, frequency and intensity of exercise, and end points for exercise.  Also discuss use of nitroglycerin and activating EMS.  Review options of places to exercise outside of rehab.  Review guidelines for sex with heart disease.   Cardiac Drugs I:  -Group instruction provided by verbal instruction and written materials to support subject.  Instructor reviews cardiac drug classes: antiplatelets, anticoagulants, beta blockers, and statins.  Instructor discusses reasons, side effects, and lifestyle considerations for each drug class.   Cardiac Drugs II:  -Group instruction provided by verbal instruction and written materials to support subject.  Instructor reviews cardiac drug classes: angiotensin converting enzyme inhibitors (ACE-I), angiotensin II receptor blockers (ARBs), nitrates, and calcium channel blockers.  Instructor discusses reasons, side effects, and lifestyle considerations for each drug class.   Anatomy and Physiology of the Circulatory System:  Group verbal and written instruction and models provide basic cardiac anatomy and physiology, with the coronary electrical and arterial systems. Review of: AMI, Angina, Valve disease, Heart Failure, Peripheral Artery Disease, Cardiac Arrhythmia, Pacemakers, and the ICD.   Other Education:  -Group or individual verbal, written, or video instructions that support the educational goals of the cardiac rehab program.   Knowledge Questionnaire Score:     Knowledge Questionnaire Score - 07/31/16 1034      Knowledge Questionnaire Score   Pre Score 24/24      Core Components/Risk Factors/Patient Goals at Admission:     Personal  Goals and Risk Factors at Admission - 07/31/16 1048      Core Components/Risk Factors/Patient Goals on Admission    Weight Management Yes;Weight Maintenance;Weight Loss   Intervention Weight Management: Develop a combined nutrition and exercise program designed to reach desired caloric intake, while maintaining appropriate intake of nutrient and fiber, sodium and fats, and appropriate energy expenditure required for the weight goal.;Weight Management: Provide education and appropriate resources to help participant work on and attain dietary goals.;Weight Management/Obesity: Establish reasonable short term and long term weight goals.   Expected Outcomes Short Term: Continue to assess and modify interventions until short term weight is achieved;Long Term: Adherence to nutrition and physical activity/exercise program aimed toward attainment of established weight goal;Weight Maintenance: Understanding of the daily nutrition guidelines, which includes 25-35% calories from fat, 7% or less cal from saturated  fats, less than 200mg  cholesterol, less than 1.5gm of sodium, & 5 or more servings of fruits and vegetables daily;Weight Loss: Understanding of general recommendations for a balanced deficit meal plan, which promotes 1-2 lb weight loss per week and includes a negative energy balance of [phone] kcal/d;Understanding recommendations for meals to include 15-35% energy as protein, 25-35% energy from fat, 35-60% energy from carbohydrates, less than 200mg  of dietary cholesterol, 20-35 gm of total fiber daily;Understanding of distribution of calorie intake throughout the day with the consumption of 4-5 meals/snacks   Improve shortness of breath with ADL's Yes   Intervention Provide education, individualized exercise plan and daily activity instruction to help decrease symptoms of SOB with activities of daily living.   Expected Outcomes Short Term: Achieves a reduction of symptoms when performing activities of daily  living.   Diabetes Yes   Intervention Provide education about signs/symptoms and action to take for hypo/hyperglycemia.;Provide education about proper nutrition, including hydration, and aerobic/resistive exercise prescription along with prescribed medications to achieve blood glucose in normal ranges: Fasting glucose 65-99 mg/dL   Expected Outcomes Short Term: Participant verbalizes understanding of the signs/symptoms and immediate care of hyper/hypoglycemia, proper foot care and importance of medication, aerobic/resistive exercise and nutrition plan for blood glucose control.;Long Term: Attainment of HbA1C < 7%.   Hypertension Yes   Intervention Provide education on lifestyle modifcations including regular physical activity/exercise, weight management, moderate sodium restriction and increased consumption of fresh fruit, vegetables, and low fat dairy, alcohol moderation, and smoking cessation.;Monitor prescription use compliance.   Expected Outcomes Short Term: Continued assessment and intervention until BP is < 140/49mm HG in hypertensive participants. < 130/76mm HG in hypertensive participants with diabetes, heart failure or chronic kidney disease.;Long Term: Maintenance of blood pressure at goal levels.   Lipids Yes   Intervention Provide education and support for participant on nutrition & aerobic/resistive exercise along with prescribed medications to achieve LDL 70mg , HDL >40mg .   Expected Outcomes Short Term: Participant states understanding of desired cholesterol values and is compliant with medications prescribed. Participant is following exercise prescription and nutrition guidelines.;Long Term: Cholesterol controlled with medications as prescribed, with individualized exercise RX and with personalized nutrition plan. Value goals: LDL < 70mg , HDL > 40 mg.   Stress Yes   Intervention Offer individual and/or small group education and counseling on adjustment to heart disease, stress management  and health-related lifestyle change. Teach and support self-help strategies.;Refer participants experiencing significant psychosocial distress to appropriate mental health specialists for further evaluation and treatment. When possible, include family members and significant others in education/counseling sessions.   Expected Outcomes Short Term: Participant demonstrates changes in health-related behavior, relaxation and other stress management skills, ability to obtain effective social support, and compliance with psychotropic medications if prescribed.;Long Term: Emotional wellbeing is indicated by absence of clinically significant psychosocial distress or social isolation.      Core Components/Risk Factors/Patient Goals Review:      Goals and Risk Factor Review    Row Name 08/26/16 0036 09/21/16 1638           Core Components/Risk Factors/Patient Goals Review   Personal Goals Review Weight Management/Obesity;Improve shortness of breath with ADL's;Develop more efficient breathing techniques such as purse lipped breathing and diaphragmatic breathing and practicing self-pacing with activity.;Lipids;Hypertension;Stress;Diabetes Weight Management/Obesity;Improve shortness of breath with ADL's;Develop more efficient breathing techniques such as purse lipped breathing and diaphragmatic breathing and practicing self-pacing with activity.;Lipids;Hypertension;Stress;Diabetes      Review Pt is off to a good start after completing 6  sessions.   Pt consistently attends twice a week.  Pt has noticed that she is better able to manage  her angina now that she is exercising in the rehab program.        Expected Outcomes Maintain healthy weight, improve sob with ADL's, lipid values, diabetes is diet controled, bp within normal limits and positive and healthy coping skills.  Maintain healthy weight, improve sob with ADL's, lipid values, diabetes is diet controled, bp within normal limits and positive and healthy  coping skills.          Core Components/Risk Factors/Patient Goals at Discharge (Final Review):      Goals and Risk Factor Review - 09/21/16 1638      Core Components/Risk Factors/Patient Goals Review   Personal Goals Review Weight Management/Obesity;Improve shortness of breath with ADL's;Develop more efficient breathing techniques such as purse lipped breathing and diaphragmatic breathing and practicing self-pacing with activity.;Lipids;Hypertension;Stress;Diabetes   Review Pt consistently attends twice a week.  Pt has noticed that she is better able to manage  her angina now that she is exercising in the rehab program.     Expected Outcomes Maintain healthy weight, improve sob with ADL's, lipid values, diabetes is diet controled, bp within normal limits and positive and healthy coping skills.       ITP Comments:     ITP Comments    Row Name 07/31/16 0936           ITP Comments Medical Director, Dr. Fransico Him          Comments:  Rennae  is making expected progress toward personal goals after  Completing 12 sessions. Psychosocial Assessment reveals improvement in outlook on life. Pt feels she is making progress with improvement of angina episodes. Pt has noticed that her symptoms are not as intense as before when she wasn't exercising. Pt remains concerned about her grandson but remains optimistic. No psychosocial needs identified at this time, no psychosocial interventions necessary. Recommend continued exercise and life style modification education including  stress management and relaxation techniques to decrease cardiac risk profile. Cherre Huger, BSN Cardiac and Training and development officer

## 2016-09-22 ENCOUNTER — Ambulatory Visit (HOSPITAL_COMMUNITY): Payer: Medicare Other

## 2016-09-22 ENCOUNTER — Encounter (HOSPITAL_COMMUNITY)
Admission: RE | Admit: 2016-09-22 | Discharge: 2016-09-22 | Disposition: A | Discharge status: Home / Self Care | Payer: Medicare Other | Source: Ambulatory Visit | Attending: Cardiology | Admitting: Cardiology

## 2016-09-22 DIAGNOSIS — I208 Other forms of angina pectoris: Secondary | ICD-10-CM | POA: Diagnosis not present

## 2016-09-22 DIAGNOSIS — I2089 Other forms of angina pectoris: Secondary | ICD-10-CM

## 2016-09-26 ENCOUNTER — Telehealth (HOSPITAL_COMMUNITY): Payer: Self-pay | Admitting: Internal Medicine

## 2016-09-26 ENCOUNTER — Encounter (HOSPITAL_COMMUNITY): Payer: Medicare Other

## 2016-09-26 ENCOUNTER — Ambulatory Visit (HOSPITAL_COMMUNITY): Payer: Medicare Other

## 2016-09-29 ENCOUNTER — Encounter (HOSPITAL_COMMUNITY)
Admission: RE | Admit: 2016-09-29 | Discharge: 2016-09-29 | Disposition: A | Discharge status: Home / Self Care | Payer: Medicare Other | Source: Ambulatory Visit | Attending: Cardiology | Admitting: Cardiology

## 2016-09-29 ENCOUNTER — Ambulatory Visit (HOSPITAL_COMMUNITY): Payer: Medicare Other

## 2016-09-29 DIAGNOSIS — I208 Other forms of angina pectoris: Secondary | ICD-10-CM | POA: Diagnosis not present

## 2016-10-03 ENCOUNTER — Encounter (HOSPITAL_COMMUNITY): Payer: Medicare Other

## 2016-10-03 ENCOUNTER — Ambulatory Visit (HOSPITAL_COMMUNITY): Payer: Medicare Other

## 2016-10-03 ENCOUNTER — Telehealth (HOSPITAL_COMMUNITY): Payer: Self-pay | Admitting: Internal Medicine

## 2016-10-06 ENCOUNTER — Encounter (HOSPITAL_COMMUNITY): Admission: RE | Admit: 2016-10-06 | Payer: Medicare Other | Source: Ambulatory Visit

## 2016-10-06 ENCOUNTER — Ambulatory Visit (HOSPITAL_COMMUNITY): Payer: Medicare Other

## 2016-10-10 ENCOUNTER — Ambulatory Visit (HOSPITAL_COMMUNITY): Payer: Medicare Other

## 2016-10-10 ENCOUNTER — Encounter (HOSPITAL_COMMUNITY)
Admission: RE | Admit: 2016-10-10 | Discharge: 2016-10-10 | Disposition: A | Discharge status: Home / Self Care | Payer: Medicare Other | Source: Ambulatory Visit | Attending: Cardiology | Admitting: Cardiology

## 2016-10-10 DIAGNOSIS — F419 Anxiety disorder, unspecified: Secondary | ICD-10-CM | POA: Diagnosis not present

## 2016-10-10 DIAGNOSIS — E039 Hypothyroidism, unspecified: Secondary | ICD-10-CM | POA: Diagnosis not present

## 2016-10-10 DIAGNOSIS — I208 Other forms of angina pectoris: Secondary | ICD-10-CM

## 2016-10-10 DIAGNOSIS — I252 Old myocardial infarction: Secondary | ICD-10-CM | POA: Diagnosis not present

## 2016-10-10 DIAGNOSIS — I10 Essential (primary) hypertension: Secondary | ICD-10-CM | POA: Diagnosis not present

## 2016-10-10 DIAGNOSIS — E785 Hyperlipidemia, unspecified: Secondary | ICD-10-CM | POA: Diagnosis not present

## 2016-10-10 DIAGNOSIS — E119 Type 2 diabetes mellitus without complications: Secondary | ICD-10-CM | POA: Insufficient documentation

## 2016-10-10 DIAGNOSIS — I872 Venous insufficiency (chronic) (peripheral): Secondary | ICD-10-CM | POA: Diagnosis not present

## 2016-10-10 DIAGNOSIS — Z7902 Long term (current) use of antithrombotics/antiplatelets: Secondary | ICD-10-CM | POA: Insufficient documentation

## 2016-10-10 DIAGNOSIS — M810 Age-related osteoporosis without current pathological fracture: Secondary | ICD-10-CM | POA: Insufficient documentation

## 2016-10-10 DIAGNOSIS — Z7982 Long term (current) use of aspirin: Secondary | ICD-10-CM | POA: Diagnosis not present

## 2016-10-10 DIAGNOSIS — M1990 Unspecified osteoarthritis, unspecified site: Secondary | ICD-10-CM | POA: Diagnosis not present

## 2016-10-10 DIAGNOSIS — Z79899 Other long term (current) drug therapy: Secondary | ICD-10-CM | POA: Diagnosis not present

## 2016-10-10 DIAGNOSIS — Z87891 Personal history of nicotine dependence: Secondary | ICD-10-CM | POA: Insufficient documentation

## 2016-10-13 ENCOUNTER — Encounter (HOSPITAL_COMMUNITY)
Admission: RE | Admit: 2016-10-13 | Discharge: 2016-10-13 | Disposition: A | Discharge status: Home / Self Care | Payer: Medicare Other | Source: Ambulatory Visit | Attending: Cardiology | Admitting: Cardiology

## 2016-10-13 DIAGNOSIS — I208 Other forms of angina pectoris: Secondary | ICD-10-CM | POA: Diagnosis not present

## 2016-10-17 ENCOUNTER — Encounter (HOSPITAL_COMMUNITY)
Admission: RE | Admit: 2016-10-17 | Discharge: 2016-10-17 | Disposition: A | Discharge status: Home / Self Care | Payer: Medicare Other | Source: Ambulatory Visit | Attending: Cardiology | Admitting: Cardiology

## 2016-10-17 DIAGNOSIS — I208 Other forms of angina pectoris: Secondary | ICD-10-CM

## 2016-10-20 ENCOUNTER — Encounter (HOSPITAL_COMMUNITY)
Admission: RE | Admit: 2016-10-20 | Discharge: 2016-10-20 | Disposition: A | Discharge status: Home / Self Care | Payer: Medicare Other | Source: Ambulatory Visit | Attending: Cardiology | Admitting: Cardiology

## 2016-10-20 DIAGNOSIS — I208 Other forms of angina pectoris: Secondary | ICD-10-CM | POA: Diagnosis not present

## 2016-10-20 NOTE — Progress Notes (Signed)
Cardiac Individual Treatment Plan  Patient Details  Name: Desiree Leon MRN: [id number] Date of Birth: 1940/12/31 Referring Provider:     CARDIAC REHAB PHASE II ORIENTATION from 07/31/2016 in [place]  Referring Provider  Kela Millin MD      Initial Encounter Date:    CARDIAC REHAB PHASE II ORIENTATION from 07/31/2016 in [place]  Date  07/31/16  Referring Provider  Kela Millin MD      Visit Diagnosis: Stable angina [facility])  Patient's Home Medications on Admission:  Current Outpatient Prescriptions:  .  acetaminophen (TYLENOL) 500 MG tablet, Take 500 mg by mouth every 6 (six) hours as needed for fever or headache. , Disp: , Rfl:  .  ALPRAZolam (XANAX) 0.25 MG tablet, Take 0.125 mg by mouth at bedtime as needed for anxiety. , Disp: , Rfl:  .  aspirin 81 MG chewable tablet, Chew 81 mg by mouth daily., Disp: , Rfl:  .  clopidogrel (PLAVIX) 75 MG tablet, Take 1 tablet (75 mg total) by mouth daily with breakfast., Disp: 30 tablet, Rfl: 1 .  escitalopram (LEXAPRO) 10 MG tablet, Take 5 mg by mouth daily., Disp: , Rfl:  .  levothyroxine (SYNTHROID, LEVOTHROID) 150 MCG tablet, Take 150 mcg by mouth daily before breakfast. , Disp: , Rfl:  .  metoprolol tartrate (LOPRESSOR) 25 MG tablet, Take 25 mg by mouth 2 (two) times daily. , Disp: , Rfl:  .  niCARdipine (CARDENE) 20 MG capsule, Take 20 mg by mouth every evening. , Disp: , Rfl:  .  nitroGLYCERIN (NITRODUR - DOSED IN MG/24 HR) 0.4 mg/hr patch, Place 0.4 mg onto the skin daily., Disp: , Rfl:  .  nitroGLYCERIN (NITROSTAT) 0.4 MG SL tablet, Place 1 tablet (0.4 mg total) under the tongue every 5 (five) minutes as needed for chest pain., Disp: 15 tablet, Rfl: 2 .  pantoprazole (PROTONIX) 40 MG tablet, Take 1 tablet (40 mg total) by mouth daily at 12 noon., Disp: 30 tablet, Rfl: 1 .  ranolazine (RANEXA) 1000 MG SR tablet, Take 1 tablet (1,000 mg total) by mouth 2 (two)  times daily. (Patient taking differently: Take 500 mg by mouth 2 (two) times daily. ), Disp: 60 tablet, Rfl: 2 .  VYTORIN 10-20 MG per tablet, Take 1 tablet by mouth daily at 6 PM. , Disp: , Rfl:   Past Medical History: Past Medical History:  Diagnosis Date  . Anxiety   . Blood transfusion   . Diabetes mellitus    type II  . Heart murmur   . History of colon polyps   . Hyperlipidemia   . Hypertension   . MI (myocardial infarction) ([place])   . Mitral valve prolapse   . Osteoarthritis   . Osteoporosis   . Spinal stenosis   . Thyroid disease    hypothyroidism  . Venous insufficiency     Tobacco Use: History  Smoking Status  . Former Smoker  . Quit date: 03/10/2005  Smokeless Tobacco  . Never Used    Labs: Recent Review Flowsheet Data    Labs for ITP Cardiac and Pulmonary Rehab Latest Ref Rng & Units 05/18/2007 05/19/2007 04/18/2014 04/20/2014   Cholestrol 0 - 200 mg/dL - 135 ATP III CLASSIFICATION: <200     mg/dL   Desirable 200-239  mg/dL   Borderline High >=240    mg/dL   High 125 -   LDLCALC 0 - 99 mg/dL - 71 Total Cholesterol/HDL:CHD Risk Coronary Heart  Disease Risk Table Men   Women 1/2 Average Risk   3.4   3.3 74 -   HDL >39 mg/dL - 27(L) 38(L) -   Trlycerides <150 mg/dL - 186(H) 65 -   Hemoglobin A1c 4.8 - 5.6 % 6.3 (NOTE)   The ADA recommends the following therapeutic goals for glycemic   control related to Hgb A1C measurement:   Goal of Therapy:   < 7.0% Hgb A1C   Action Suggested:  > 8.0% Hgb A1C   Ref:  Diabetes Care, 22, Suppl. 1, 1999(H) - - 6.0(H)      Capillary Blood Glucose: Lab Results  Component Value Date   GLUCAP 117 (H) 08/08/2016   GLUCAP 144 (H) 07/03/2014   GLUCAP 124 (H) 06/30/2014   GLUCAP 132 (H) 06/28/2014   GLUCAP 105 (H) 06/26/2014     Exercise Target Goals:    Exercise Program Goal: Individual exercise prescription set with THRR, safety & activity barriers. Participant demonstrates ability to understand and report RPE using BORG  scale, to self-measure pulse accurately, and to acknowledge the importance of the exercise prescription.  Exercise Prescription Goal: Starting with aerobic activity 30 plus minutes a day, 3 days per week for initial exercise prescription. Provide home exercise prescription and guidelines that participant acknowledges understanding prior to discharge.  Activity Barriers & Risk Stratification:     Activity Barriers & Cardiac Risk Stratification - 07/31/16 1103      Activity Barriers & Cardiac Risk Stratification   Activity Barriers Back Problems;Arthritis;Balance Concerns;Shortness of Breath;Muscular Weakness;Deconditioning;Other (comment)   Comments L hip pain   Cardiac Risk Stratification High      6 Minute Walk:     6 Minute Walk    Row Name 07/31/16 0944 07/31/16 1105       6 Minute Walk   Phase Initial  -    Distance 1200 feet  -    Walk Time 6 minutes  -    # of Rest Breaks 0  -    MPH 2.3  -    METS 2  -    RPE 14  -    VO2 Peak 7.2  -    Symptoms No  -    Resting HR 56 bpm  -    Resting BP 112/60  -    Max Ex. HR 72 bpm  -    Max Ex. BP 138/66  -    2 Minute Post BP  - 132/62       Oxygen Initial Assessment:   Oxygen Re-Evaluation:   Oxygen Discharge (Final Oxygen Re-Evaluation):   Initial Exercise Prescription:     Initial Exercise Prescription - 07/31/16 1100      Date of Initial Exercise RX and Referring Provider   Date 07/31/16   Referring Provider Kela Millin MD     Oxygen   Oxygen Continuous   Liters 2     Recumbant Bike   Level 2   Minutes 10   METs 1.7     NuStep   Level 2   SPM 70   Minutes 10   METs 2     Track   Laps 8   Minutes 10   METs 2.39     Prescription Details   Frequency (times per week) 3   Duration Progress to 30 minutes of continuous aerobic without signs/symptoms of physical distress     Intensity   THRR 40-80% of Max Heartrate 58-115   Ratings of Perceived Exertion  11-13   Perceived Dyspnea 0-4      Progression   Progression Continue to progress workloads to maintain intensity without signs/symptoms of physical distress.     Resistance Training   Training Prescription Yes   Weight 2lbs   Reps 10-15      Perform Capillary Blood Glucose checks as needed.  Exercise Prescription Changes:      Exercise Prescription Changes    Row Name 08/12/16 1300 08/18/16 1100 09/05/16 1433 09/22/16 1400 09/29/16 1639     Response to Exercise   Blood Pressure (Admit) 122/60  - 104/60 122/60 110/60   Blood Pressure (Exercise) 122/70  - 144/70 122/70 138/50   Blood Pressure (Exit) 112/72  - 122/68 112/70 112/72   Heart Rate (Admit) 61 bpm  - 57 bpm 60 bpm 64 bpm   Heart Rate (Exercise) 81 bpm  - 101 bpm 84 bpm 78 bpm   Heart Rate (Exit) 61 bpm  - 59 bpm 60 bpm 64 bpm   Rating of Perceived Exertion (Exercise) 13  - '12 13 13   ' Symptoms none  - none none none   Duration Progress to 30 minutes of  aerobic without signs/symptoms of physical distress  - Continue with 30 min of aerobic exercise without signs/symptoms of physical distress. Continue with 30 min of aerobic exercise without signs/symptoms of physical distress. Continue with 30 min of aerobic exercise without signs/symptoms of physical distress.   Intensity THRR unchanged  - THRR unchanged THRR unchanged THRR unchanged     Progression   Progression  -  - Continue to progress workloads to maintain intensity without signs/symptoms of physical distress. Continue to progress workloads to maintain intensity without signs/symptoms of physical distress. Continue to progress workloads to maintain intensity without signs/symptoms of physical distress.   Average METs  -  - 2.5 2.5 2.5     Resistance Training   Training Prescription No  - Yes Yes Yes   Weight  -  - 2lbs 3lbs 3lbs   Reps  -  - 10-15 10-15 10-15   Time  -  - 10 Minutes 10 Minutes 10 Minutes     Oxygen   Oxygen Continuous  - Continuous Continuous Continuous   Liters 2  - '2 2 2      ' Recumbant Bike   Level 2  - 2 2 2.5   Minutes 10  - '10 10 10   ' METs 1.7  - '2 2 3     ' NuStep   Level 2  - '2 3 3   ' SPM 70  - 80 80 80   Minutes 10  - '10 10 10   ' METs 2.1  - 2.3 2.3 2.5     Track   Laps 7  - '10 10 7   ' Minutes 10  - '10 10 10   ' METs 2.23  - 2.74 2.74 2.23     Home Exercise Plan   Plans to continue exercise at  - [employer] (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 Add 3 additional days to program exercise sessions. Add 3 additional days to program exercise sessions. Add 3 additional days to program exercise sessions.   Initial Home Exercises Provided  -  - 08/18/16 08/18/16 08/18/16   Row Name 10/21/16 1600             Response to Exercise   Blood Pressure (Admit)  108/68       Blood Pressure (Exercise) 150/76       Blood Pressure (Exit) 116/62       Heart Rate (Admit) 63 bpm       Heart Rate (Exercise) 80 bpm       Heart Rate (Exit) 55 bpm       Rating of Perceived Exertion (Exercise) 12       Symptoms none       Duration Continue with 30 min of aerobic exercise without signs/symptoms of physical distress.       Intensity THRR unchanged         Progression   Progression Continue to progress workloads to maintain intensity without signs/symptoms of physical distress.       Average METs 2.8         Resistance Training   Training Prescription Yes       Weight 3lbs       Reps 10-15       Time 10 Minutes         Oxygen   Oxygen Continuous       Liters 2         Recumbant Bike   Level 2.5       Minutes 10       METs 3.1         NuStep   Level 3       SPM 80       Minutes 10       METs 2.5         Track   Laps 11       Minutes 10       METs 2.92         Home Exercise Plan   Plans to continue exercise at [employer] (comment)       Frequency Add 3 additional days to program exercise sessions.        Initial Home Exercises Provided 08/18/16          Exercise Comments:      Exercise Comments    Row Name 08/18/16 1157 09/22/16 1439 10/21/16 1643       Exercise Comments Home exercise completed Reviewed METs and goals. Pt is tolerating exercise well; pt will continue to monitor pt's progress. Reviewed METs and goals. Pt is tolerating exercise well; pt will continue to monitor pt's progress.        Exercise Goals and Review:      Exercise Goals    Row Name 07/31/16 1101             Exercise Goals   Increase Physical Activity Yes       Intervention Provide advice, education, support and counseling about physical activity/exercise needs.;Develop an individualized exercise prescription for aerobic and resistive training based on initial evaluation findings, risk stratification, comorbidities and participant's personal goals.       Expected Outcomes Achievement of increased cardiorespiratory fitness and enhanced flexibility, muscular endurance and strength shown through measurements of functional capacity and personal statement of participant.       Increase Strength and Stamina Yes  improve symptoms of SOB with climbing stairs       Intervention Provide advice, education, support and counseling about physical activity/exercise needs.;Develop an individualized exercise prescription for aerobic and resistive training based on initial evaluation findings, risk stratification, comorbidities and participant's personal goals.       Expected Outcomes Achievement of increased cardiorespiratory fitness and enhanced flexibility, muscular  endurance and strength shown through measurements of functional capacity and personal statement of participant.          Exercise Goals Re-Evaluation :     Exercise Goals Re-Evaluation    Row Name 08/18/16 1611 08/26/16 1610 09/22/16 1438 10/21/16 1635       Exercise Goal Re-Evaluation   Exercise Goals Review  - Increase Physical Activity;Increase  Strenth and Stamina Increase Physical Activity;Increase Strenth and Stamina Increase Physical Activity;Increase Strenth and Stamina    Comments Reviewed home exercise with pt today.  Pt plans to walk at [employer] and other box stores for exercise.  Reviewed THR, pulse, RPE, sign and symptoms, NTG use, and when to call 911 or MD.  Also discussed weather considerations and indoor options.  Pt voiced understanding.  - Pt is tolerating WL increases very well. Pt has also increased in walking tolerance and is doing well with HEP. Pt is making progress and increasing Nustep MET average on consistent basis.    Expected Outcomes Pt will be compliant with HEP and build on walking tolerance and overall cardiorespiratory fitness  - Pt will be compliant with HEP and build on walking tolerance and overall cardiorespiratory fitness Pt will  build on activity levels and MET average as well as overall cardiorespiratory fitness        Discharge Exercise Prescription (Final Exercise Prescription Changes):     Exercise Prescription Changes - 10/21/16 1600      Response to Exercise   Blood Pressure (Admit) 108/68   Blood Pressure (Exercise) 150/76   Blood Pressure (Exit) 116/62   Heart Rate (Admit) 63 bpm   Heart Rate (Exercise) 80 bpm   Heart Rate (Exit) 55 bpm   Rating of Perceived Exertion (Exercise) 12   Symptoms none   Duration Continue with 30 min of aerobic exercise without signs/symptoms of physical distress.   Intensity THRR unchanged     Progression   Progression Continue to progress workloads to maintain intensity without signs/symptoms of physical distress.   Average METs 2.8     Resistance Training   Training Prescription Yes   Weight 3lbs   Reps 10-15   Time 10 Minutes     Oxygen   Oxygen Continuous   Liters 2     Recumbant Bike   Level 2.5   Minutes 10   METs 3.1     NuStep   Level 3   SPM 80   Minutes 10   METs 2.5     Track   Laps 11   Minutes 10   METs 2.92     Home  Exercise Plan   Plans to continue exercise at [employer] (comment)   Frequency Add 3 additional days to program exercise sessions.   Initial Home Exercises Provided 08/18/16      Nutrition:  Target Goals: Understanding of nutrition guidelines, daily intake of sodium <1573m, cholesterol <2084m calories 30% from fat and 7% or less from saturated fats, daily to have 5 or more servings of fruits and vegetables.  Biometrics:     Pre Biometrics - 07/31/16 1102      Pre Biometrics   Height 5' 4.5" (1.638 m)   Weight 170 lb 13.7 oz (77.5 kg)   Waist Circumference 38.25 inches   Hip Circumference 42 inches   Waist to Hip Ratio 0.91 %   BMI (Calculated) 28.9   Triceps Skinfold 37 mm   % Body Fat 42.8 %   Grip Strength 28 kg  Flexibility 0 in  LBP!   Single Leg Stand 15 seconds       Nutrition Therapy Plan and Nutrition Goals:     Nutrition Therapy & Goals - 09/15/16 1347      Nutrition Therapy   Diet Carb Modified, Therapeutic Lifestyle Changes     Personal Nutrition Goals   Nutrition Goal Wt loss of 1-2 lb/week to a wt loss goal of 6-10 lb at graduation from [place], educate and counsel regarding individualized specific dietary modifications aiming towards targeted core components such as weight, hypertension, lipid management, diabetes, heart failure and other comorbidities.   Expected Outcomes Short Term Goal: Understand basic principles of dietary content, such as calories, fat, sodium, cholesterol and nutrients.;Long Term Goal: Adherence to prescribed nutrition plan.      Nutrition Discharge: Nutrition Scores:     Nutrition Assessments - 09/15/16 1347      MEDFICTS Scores   Pre Score 3      Nutrition Goals Re-Evaluation:   Nutrition Goals Re-Evaluation:   Nutrition Goals Discharge (Final Nutrition Goals Re-Evaluation):   Psychosocial: Target Goals: Acknowledge presence or absence of  significant depression and/or stress, maximize coping skills, provide positive support system. Participant is able to verbalize types and ability to use techniques and skills needed for reducing stress and depression.  Initial Review & Psychosocial Screening:     Initial Psych Review & Screening - 07/31/16 1651      Initial Review   Current issues with Current Depression;Current Anxiety/Panic  husband passes away 2 years ago. health related anxiety.     Family Dynamics   Good Support System? Yes     Barriers   Psychosocial barriers to participate in program The patient should benefit from training in stress management and relaxation.      Quality of Life Scores:     Quality of Life - 07/31/16 1048      Quality of Life Scores   Health/Function Pre 10.29 %   Socioeconomic Pre 24.5 %   Psych/Spiritual Pre 18.64 %   Family Pre 24 %   GLOBAL Pre 16.94 %      PHQ-9: Recent Review Flowsheet Data    Depression screen [facility] 2/9 08/08/2016 09/22/2014 06/12/2014   Decreased Interest 0 0 1    Down, Depressed, Hopeless 1 0 1    PHQ - 2 Score 1 0 2   Altered sleeping - - 0   Tired, decreased energy - - 3    Change in appetite - - 0   Feeling bad or failure about yourself  - - 0   Trouble concentrating - - 0   Moving slowly or fidgety/restless - - 0   Suicidal thoughts - - 0   PHQ-9 Score - - 5   Difficult doing work/chores - - Somewhat difficult     Interpretation of Total Score  Total Score Depression Severity:  1-4 = Minimal depression, 5-9 = Mild depression, 10-14 = Moderate depression, 15-19 = Moderately severe depression, 20-27 = Severe depression   Psychosocial Evaluation and Intervention:     Psychosocial Evaluation - 10/20/16 1336      Psychosocial Evaluation & Interventions   Interventions Relaxation education;Stress management education;Encouraged to exercise with the program and follow exercise prescription   Comments pt remarks improvement of her outlook on life.   pt is suported by her family.   Continue Psychosocial Services  Follow up required by staff  Psychosocial Re-Evaluation:     Psychosocial Re-Evaluation    [place] Name 08/26/16 0037 09/21/16 1641 10/20/16 1336         Psychosocial Re-Evaluation   Current issues with Current Stress Concerns Current Stress Concerns Current Stress Concerns     Comments Pt worries about her grandson who she thinks may have autism Improvement on how she is coping with her grandson diagnosis.  Pt is limited within the home due to oxygen therapy in home based and pt does not qualify for oxygen use outside the home due to normal o2 sat readings Improvement on how she is coping with her grandson diagnosis.  Pt is limited within the home due to oxygen therapy in home based and pt does not qualify for oxygen use outside the home due to normal o2 sat readings     Interventions Relaxation education;Stress management education;Encouraged to attend Cardiac Rehabilitation for the exercise Relaxation education;Stress management education;Encouraged to attend Cardiac Rehabilitation for the exercise Relaxation education;Stress management education;Encouraged to attend Cardiac Rehabilitation for the exercise     Continue Psychosocial Services  Follow up required by staff Follow up required by staff Follow up required by staff       Initial Review   Source of Stress Concerns  - Family;Unable to participate in former interests or hobbies;Unable to perform yard/household activities  -        Psychosocial Discharge (Final Psychosocial Re-Evaluation):     Psychosocial Re-Evaluation - 10/20/16 1336      Psychosocial Re-Evaluation   Current issues with Current Stress Concerns   Comments Improvement on how she is coping with her grandson diagnosis.  Pt is limited within the home due to oxygen therapy in home based and pt does not qualify for oxygen use outside the home due to normal o2 sat readings   Interventions Relaxation  education;Stress management education;Encouraged to attend Cardiac Rehabilitation for the exercise   Continue Psychosocial Services  Follow up required by staff      Vocational Rehabilitation: Provide vocational rehab assistance to qualifying candidates.   Vocational Rehab Evaluation & Intervention:     Vocational Rehab - 07/31/16 1654      Initial Vocational Rehab Evaluation & Intervention   Assessment shows need for Vocational Rehabilitation No      Education: Education Goals: Education classes will be provided on a weekly basis, covering required topics. Participant will state understanding/return demonstration of topics presented.  Learning Barriers/Preferences:     Learning Barriers/Preferences - 07/31/16 1104      Learning Barriers/Preferences   Learning Barriers Sight  cataract surgery   Learning Preferences Skilled Demonstration;Verbal Instruction;Written Material;Pictoral      Education Topics: Count Your Pulse:  -Group instruction provided by verbal instruction, demonstration, patient participation and written materials to support subject.  Instructors address importance of being able to find your pulse and how to count your pulse when at home without a heart monitor.  Patients get hands on experience counting their pulse with staff help and individually.   CARDIAC REHAB PHASE II EXERCISE from 10/17/2016 in [place]  Date  09/12/16  Instruction Review Code  2- meets goals/outcomes      Heart Attack, Angina, and Risk Factor Modification:  -Group instruction provided by verbal instruction, video, and written materials to support subject.  Instructors address signs and symptoms of angina and heart attacks.    Also discuss risk factors for heart disease and how to make changes to improve heart health risk  factors.   Functional Fitness:  -Group instruction provided by verbal instruction, demonstration, patient participation, and  written materials to support subject.  Instructors address safety measures for doing things around the house.  Discuss how to get up and down off the floor, how to pick things up properly, how to safely get out of a chair without assistance, and balance training.   CARDIAC REHAB PHASE II EXERCISE from 10/17/2016 in [place]  Date  08/22/16  Instruction Review Code  2- meets goals/outcomes      Meditation and Mindfulness:  -Group instruction provided by verbal instruction, patient participation, and written materials to support subject.  Instructor addresses importance of mindfulness and meditation practice to help reduce stress and improve awareness.  Instructor also leads participants through a meditation exercise.    Stretching for Flexibility and Mobility:  -Group instruction provided by verbal instruction, patient participation, and written materials to support subject.  Instructors lead participants through series of stretches that are designed to increase flexibility thus improving mobility.  These stretches are additional exercise for major muscle groups that are typically performed during regular warm up and cool down.   Hands Only CPR:  -Group verbal, video, and participation provides a basic overview of AHA guidelines for community CPR. Role-play of emergencies allow participants the opportunity to practice calling for help and chest compression technique with discussion of AED use.   Hypertension: -Group verbal and written instruction that provides a basic overview of hypertension including the most recent diagnostic guidelines, risk factor reduction with self-care instructions and medication management.   CARDIAC REHAB PHASE II EXERCISE from 10/17/2016 in [place]  Date  10/17/16  Instruction Review Code  2- meets goals/outcomes       Nutrition I class: Heart Healthy Eating:  -Group instruction provided by  PowerPoint slides, verbal discussion, and written materials to support subject matter. The instructor gives an explanation and review of the Therapeutic Lifestyle Changes diet recommendations, which includes a discussion on lipid goals, dietary fat, sodium, fiber, plant stanol/sterol esters, sugar, and the components of a well-balanced, healthy diet.   Nutrition II class: Lifestyle Skills:  -Group instruction provided by PowerPoint slides, verbal discussion, and written materials to support subject matter. The instructor gives an explanation and review of label reading, grocery shopping for heart health, heart healthy recipe modifications, and ways to make healthier choices when eating out.   Diabetes Question & Answer:  -Group instruction provided by PowerPoint slides, verbal discussion, and written materials to support subject matter. The instructor gives an explanation and review of diabetes co-morbidities, pre- and post-prandial blood glucose goals, pre-exercise blood glucose goals, signs, symptoms, and treatment of hypoglycemia and hyperglycemia, and foot care basics.   CARDIAC REHAB PHASE II EXERCISE from 10/17/2016 in [place]  Date  09/19/16  Educator  RD  Instruction Review Code  2- meets goals/outcomes      Diabetes Blitz:  -Group instruction provided by PowerPoint slides, verbal discussion, and written materials to support subject matter. The instructor gives an explanation and review of the physiology behind type 1 and type 2 diabetes, diabetes medications and rational behind using different medications, pre- and post-prandial blood glucose recommendations and Hemoglobin A1c goals, diabetes diet, and exercise including blood glucose guidelines for exercising safely.    Portion Distortion:  -Group instruction provided by PowerPoint slides, verbal discussion, written materials, and food models to support subject matter. The instructor gives an  explanation of serving size versus portion size, changes in portions sizes over the last 20 years, and what consists of a serving from each food group.   Stress Management:  -Group instruction provided by verbal instruction, video, and written materials to support subject matter.  Instructors review role of stress in heart disease and how to cope with stress positively.     Exercising on Your Own:  -Group instruction provided by verbal instruction, power point, and written materials to support subject.  Instructors discuss benefits of exercise, components of exercise, frequency and intensity of exercise, and end points for exercise.  Also discuss use of nitroglycerin and activating EMS.  Review options of places to exercise outside of rehab.  Review guidelines for sex with heart disease.   Cardiac Drugs I:  -Group instruction provided by verbal instruction and written materials to support subject.  Instructor reviews cardiac drug classes: antiplatelets, anticoagulants, beta blockers, and statins.  Instructor discusses reasons, side effects, and lifestyle considerations for each drug class.   Cardiac Drugs II:  -Group instruction provided by verbal instruction and written materials to support subject.  Instructor reviews cardiac drug classes: angiotensin converting enzyme inhibitors (ACE-I), angiotensin II receptor blockers (ARBs), nitrates, and calcium channel blockers.  Instructor discusses reasons, side effects, and lifestyle considerations for each drug class.   Anatomy and Physiology of the Circulatory System:  Group verbal and written instruction and models provide basic cardiac anatomy and physiology, with the coronary electrical and arterial systems. Review of: AMI, Angina, Valve disease, Heart Failure, Peripheral Artery Disease, Cardiac Arrhythmia, Pacemakers, and the ICD.   Other Education:  -Group or individual verbal, written, or video instructions that support the educational  goals of the cardiac rehab program.   Knowledge Questionnaire Score:     Knowledge Questionnaire Score - 07/31/16 1034      Knowledge Questionnaire Score   Pre Score 24/24      Core Components/Risk Factors/Patient Goals at Admission:     Personal Goals and Risk Factors at Admission - 07/31/16 1048      Core Components/Risk Factors/Patient Goals on Admission    Weight Management Yes;Weight Maintenance;Weight Loss   Intervention Weight Management: Develop a combined nutrition and exercise program designed to reach desired caloric intake, while maintaining appropriate intake of nutrient and fiber, sodium and fats, and appropriate energy expenditure required for the weight goal.;Weight Management: Provide education and appropriate resources to help participant work on and attain dietary goals.;Weight Management/Obesity: Establish reasonable short term and long term weight goals.   Expected Outcomes Short Term: Continue to assess and modify interventions until short term weight is achieved;Long Term: Adherence to nutrition and physical activity/exercise program aimed toward attainment of established weight goal;Weight Maintenance: Understanding of the daily nutrition guidelines, which includes 25-35% calories from fat, 7% or less cal from saturated fats, less than 272m cholesterol, less than 1.5gm of sodium, & 5 or more servings of fruits and vegetables daily;Weight Loss: Understanding of general recommendations for a balanced deficit meal plan, which promotes 1-2 lb weight loss per week and includes a negative energy balance of [phone] kcal/d;Understanding recommendations for meals to include 15-35% energy as protein, 25-35% energy from fat, 35-60% energy from carbohydrates, less than 2048mof dietary cholesterol, 20-35 gm of total fiber daily;Understanding of distribution of calorie intake throughout the day with the consumption of 4-5 meals/snacks   Improve shortness of breath with ADL's Yes    Intervention Provide education, individualized exercise plan and daily activity instruction to help decrease symptoms  of SOB with activities of daily living.   Expected Outcomes Short Term: Achieves a reduction of symptoms when performing activities of daily living.   Diabetes Yes   Intervention Provide education about signs/symptoms and action to take for hypo/hyperglycemia.;Provide education about proper nutrition, including hydration, and aerobic/resistive exercise prescription along with prescribed medications to achieve blood glucose in normal ranges: Fasting glucose 65-99 mg/dL   Expected Outcomes Short Term: Participant verbalizes understanding of the signs/symptoms and immediate care of hyper/hypoglycemia, proper foot care and importance of medication, aerobic/resistive exercise and nutrition plan for blood glucose control.;Long Term: Attainment of HbA1C < 7%.   Hypertension Yes   Intervention Provide education on lifestyle modifcations including regular physical activity/exercise, weight management, moderate sodium restriction and increased consumption of fresh fruit, vegetables, and low fat dairy, alcohol moderation, and smoking cessation.;Monitor prescription use compliance.   Expected Outcomes Short Term: Continued assessment and intervention until BP is < 140/35m HG in hypertensive participants. < 130/848mHG in hypertensive participants with diabetes, heart failure or chronic kidney disease.;Long Term: Maintenance of blood pressure at goal levels.   Lipids Yes   Intervention Provide education and support for participant on nutrition & aerobic/resistive exercise along with prescribed medications to achieve LDL <7062mHDL >6m31m Expected Outcomes Short Term: Participant states understanding of desired cholesterol values and is compliant with medications prescribed. Participant is following exercise prescription and nutrition guidelines.;Long Term: Cholesterol controlled with medications as  prescribed, with individualized exercise RX and with personalized nutrition plan. Value goals: LDL < 70mg68mL > 40 mg.   Stress Yes   Intervention Offer individual and/or small group education and counseling on adjustment to heart disease, stress management and health-related lifestyle change. Teach and support self-help strategies.;Refer participants experiencing significant psychosocial distress to appropriate mental health specialists for further evaluation and treatment. When possible, include family members and significant others in education/counseling sessions.   Expected Outcomes Short Term: Participant demonstrates changes in health-related behavior, relaxation and other stress management skills, ability to obtain effective social support, and compliance with psychotropic medications if prescribed.;Long Term: Emotional wellbeing is indicated by absence of clinically significant psychosocial distress or social isolation.      Core Components/Risk Factors/Patient Goals Review:      Goals and Risk Factor Review    Row Name 08/26/16 0036 09/21/16 1638           Core Components/Risk Factors/Patient Goals Review   Personal Goals Review Weight Management/Obesity;Improve shortness of breath with ADL's;Develop more efficient breathing techniques such as purse lipped breathing and diaphragmatic breathing and practicing self-pacing with activity.;Lipids;Hypertension;Stress;Diabetes Weight Management/Obesity;Improve shortness of breath with ADL's;Develop more efficient breathing techniques such as purse lipped breathing and diaphragmatic breathing and practicing self-pacing with activity.;Lipids;Hypertension;Stress;Diabetes      Review Pt is off to a good start after completing 6 sessions.   Pt consistently attends twice a week.  Pt has noticed that she is better able to manage  her angina now that she is exercising in the rehab program.        Expected Outcomes Maintain healthy weight, improve sob  with ADL's, lipid values, diabetes is diet controled, bp within normal limits and positive and healthy coping skills.  Maintain healthy weight, improve sob with ADL's, lipid values, diabetes is diet controled, bp within normal limits and positive and healthy coping skills.          Core Components/Risk Factors/Patient Goals at Discharge (Final Review):      Goals and Risk Factor Review -  09/21/16 1638      Core Components/Risk Factors/Patient Goals Review   Personal Goals Review Weight Management/Obesity;Improve shortness of breath with ADL's;Develop more efficient breathing techniques such as purse lipped breathing and diaphragmatic breathing and practicing self-pacing with activity.;Lipids;Hypertension;Stress;Diabetes   Review Pt consistently attends twice a week.  Pt has noticed that she is better able to manage  her angina now that she is exercising in the rehab program.     Expected Outcomes Maintain healthy weight, improve sob with ADL's, lipid values, diabetes is diet controled, bp within normal limits and positive and healthy coping skills.       ITP Comments:     ITP Comments    Row Name 07/31/16 0936           ITP Comments Medical Director, Dr. Fransico Him          Comments:  Abrielle is making expected progress toward personal goals after completing 19 sessions attending 2 x week .Psychosocial Assessment reveals improvement in outlook on life. Pt feels she is making progress with improvement of angina episodes. Pt has noticed that her symptoms are not as intense as before when she wasn't exercising. Pt remains concerned about her grandson but remains optimistic. Pt has supportive family. No psychosocial needs identified at this time, no psychosocial interventions necessary.  Recommend continued exercise and life style modification education including  stress management and relaxation techniques to decrease cardiac risk profile.Cherre Huger, BSN Cardiac and Dealer

## 2016-10-24 ENCOUNTER — Encounter (HOSPITAL_COMMUNITY)
Admission: RE | Admit: 2016-10-24 | Discharge: 2016-10-24 | Disposition: A | Discharge status: Home / Self Care | Payer: Medicare Other | Source: Ambulatory Visit | Attending: Cardiology | Admitting: Cardiology

## 2016-10-24 DIAGNOSIS — I208 Other forms of angina pectoris: Secondary | ICD-10-CM | POA: Diagnosis not present

## 2016-10-27 ENCOUNTER — Encounter (HOSPITAL_COMMUNITY)
Admission: RE | Admit: 2016-10-27 | Discharge: 2016-10-27 | Disposition: A | Discharge status: Home / Self Care | Payer: Medicare Other | Source: Ambulatory Visit | Attending: Cardiology | Admitting: Cardiology

## 2016-10-27 DIAGNOSIS — I208 Other forms of angina pectoris: Secondary | ICD-10-CM

## 2016-10-31 ENCOUNTER — Encounter (HOSPITAL_COMMUNITY)
Admission: RE | Admit: 2016-10-31 | Discharge: 2016-10-31 | Disposition: A | Discharge status: Home / Self Care | Payer: Medicare Other | Source: Ambulatory Visit | Attending: Cardiology | Admitting: Cardiology

## 2016-10-31 DIAGNOSIS — I208 Other forms of angina pectoris: Secondary | ICD-10-CM

## 2016-10-31 DIAGNOSIS — I2089 Other forms of angina pectoris: Secondary | ICD-10-CM

## 2016-11-03 ENCOUNTER — Encounter (HOSPITAL_COMMUNITY)
Admission: RE | Admit: 2016-11-03 | Discharge: 2016-11-03 | Disposition: A | Discharge status: Home / Self Care | Payer: Medicare Other | Source: Ambulatory Visit | Attending: Cardiology | Admitting: Cardiology

## 2016-11-03 DIAGNOSIS — I208 Other forms of angina pectoris: Secondary | ICD-10-CM

## 2016-11-07 ENCOUNTER — Encounter (HOSPITAL_COMMUNITY)
Admission: RE | Admit: 2016-11-07 | Discharge: 2016-11-07 | Disposition: A | Discharge status: Home / Self Care | Payer: Medicare Other | Source: Ambulatory Visit | Attending: Cardiology | Admitting: Cardiology

## 2016-11-07 DIAGNOSIS — I208 Other forms of angina pectoris: Secondary | ICD-10-CM

## 2016-11-14 ENCOUNTER — Encounter (HOSPITAL_COMMUNITY): Payer: Medicare Other

## 2016-11-14 ENCOUNTER — Telehealth (HOSPITAL_COMMUNITY): Payer: Self-pay | Admitting: Internal Medicine

## 2016-11-17 ENCOUNTER — Encounter (HOSPITAL_COMMUNITY)
Admission: RE | Admit: 2016-11-17 | Discharge: 2016-11-17 | Disposition: A | Discharge status: Home / Self Care | Payer: Medicare Other | Source: Ambulatory Visit | Attending: Cardiology | Admitting: Cardiology

## 2016-11-17 DIAGNOSIS — Z7902 Long term (current) use of antithrombotics/antiplatelets: Secondary | ICD-10-CM | POA: Insufficient documentation

## 2016-11-17 DIAGNOSIS — I208 Other forms of angina pectoris: Secondary | ICD-10-CM | POA: Insufficient documentation

## 2016-11-17 DIAGNOSIS — Z87891 Personal history of nicotine dependence: Secondary | ICD-10-CM | POA: Diagnosis not present

## 2016-11-17 DIAGNOSIS — F419 Anxiety disorder, unspecified: Secondary | ICD-10-CM | POA: Insufficient documentation

## 2016-11-17 DIAGNOSIS — M810 Age-related osteoporosis without current pathological fracture: Secondary | ICD-10-CM | POA: Insufficient documentation

## 2016-11-17 DIAGNOSIS — I2089 Other forms of angina pectoris: Secondary | ICD-10-CM

## 2016-11-17 DIAGNOSIS — E785 Hyperlipidemia, unspecified: Secondary | ICD-10-CM | POA: Diagnosis not present

## 2016-11-17 DIAGNOSIS — E039 Hypothyroidism, unspecified: Secondary | ICD-10-CM | POA: Diagnosis not present

## 2016-11-17 DIAGNOSIS — I252 Old myocardial infarction: Secondary | ICD-10-CM | POA: Insufficient documentation

## 2016-11-17 DIAGNOSIS — Z7982 Long term (current) use of aspirin: Secondary | ICD-10-CM | POA: Insufficient documentation

## 2016-11-17 DIAGNOSIS — Z79899 Other long term (current) drug therapy: Secondary | ICD-10-CM | POA: Diagnosis not present

## 2016-11-17 DIAGNOSIS — E119 Type 2 diabetes mellitus without complications: Secondary | ICD-10-CM | POA: Insufficient documentation

## 2016-11-17 DIAGNOSIS — M1990 Unspecified osteoarthritis, unspecified site: Secondary | ICD-10-CM | POA: Insufficient documentation

## 2016-11-17 DIAGNOSIS — I872 Venous insufficiency (chronic) (peripheral): Secondary | ICD-10-CM | POA: Diagnosis not present

## 2016-11-17 DIAGNOSIS — I10 Essential (primary) hypertension: Secondary | ICD-10-CM | POA: Diagnosis not present

## 2016-11-18 NOTE — Progress Notes (Signed)
Cardiac Individual Treatment Plan  Patient Details  Name: Desiree Leon MRN: [id number] Date of Birth: 1941/03/04 Referring Provider:     CARDIAC REHAB PHASE II ORIENTATION from 07/31/2016 in [place]  Referring Provider  Kela Millin MD      Initial Encounter Date:    CARDIAC REHAB PHASE II ORIENTATION from 07/31/2016 in [place]  Date  07/31/16  Referring Provider  Kela Millin MD      Visit Diagnosis: Stable angina [facility])  Patient's Home Medications on Admission:  Current Outpatient Prescriptions:  .  acetaminophen (TYLENOL) 500 MG tablet, Take 500 mg by mouth every 6 (six) hours as needed for fever or headache. , Disp: , Rfl:  .  ALPRAZolam (XANAX) 0.25 MG tablet, Take 0.125 mg by mouth at bedtime as needed for anxiety. , Disp: , Rfl:  .  aspirin 81 MG chewable tablet, Chew 81 mg by mouth daily., Disp: , Rfl:  .  clopidogrel (PLAVIX) 75 MG tablet, Take 1 tablet (75 mg total) by mouth daily with breakfast., Disp: 30 tablet, Rfl: 1 .  escitalopram (LEXAPRO) 10 MG tablet, Take 5 mg by mouth daily., Disp: , Rfl:  .  levothyroxine (SYNTHROID, LEVOTHROID) 150 MCG tablet, Take 150 mcg by mouth daily before breakfast. , Disp: , Rfl:  .  metoprolol tartrate (LOPRESSOR) 25 MG tablet, Take 25 mg by mouth 2 (two) times daily. , Disp: , Rfl:  .  niCARdipine (CARDENE) 20 MG capsule, Take 20 mg by mouth every evening. , Disp: , Rfl:  .  nitroGLYCERIN (NITRODUR - DOSED IN MG/24 HR) 0.4 mg/hr patch, Place 0.4 mg onto the skin daily., Disp: , Rfl:  .  nitroGLYCERIN (NITROSTAT) 0.4 MG SL tablet, Place 1 tablet (0.4 mg total) under the tongue every 5 (five) minutes as needed for chest pain., Disp: 15 tablet, Rfl: 2 .  pantoprazole (PROTONIX) 40 MG tablet, Take 1 tablet (40 mg total) by mouth daily at 12 noon., Disp: 30 tablet, Rfl: 1 .  ranolazine (RANEXA) 1000 MG SR tablet, Take 1 tablet (1,000 mg total) by mouth 2 (two)  times daily. (Patient taking differently: Take 500 mg by mouth 2 (two) times daily. ), Disp: 60 tablet, Rfl: 2 .  VYTORIN 10-20 MG per tablet, Take 1 tablet by mouth daily at 6 PM. , Disp: , Rfl:   Past Medical History: Past Medical History:  Diagnosis Date  . Anxiety   . Blood transfusion   . Diabetes mellitus    type II  . Heart murmur   . History of colon polyps   . Hyperlipidemia   . Hypertension   . MI (myocardial infarction) ([place])   . Mitral valve prolapse   . Osteoarthritis   . Osteoporosis   . Spinal stenosis   . Thyroid disease    hypothyroidism  . Venous insufficiency     Tobacco Use: History  Smoking Status  . Former Smoker  . Quit date: 03/10/2005  Smokeless Tobacco  . Never Used    Labs: Recent Review Flowsheet Data    Labs for ITP Cardiac and Pulmonary Rehab Latest Ref Rng & Units 05/18/2007 05/19/2007 04/18/2014 04/20/2014   Cholestrol 0 - 200 mg/dL - 135 ATP III CLASSIFICATION: <200     mg/dL   Desirable 200-239  mg/dL   Borderline High >=240    mg/dL   High 125 -   LDLCALC 0 - 99 mg/dL - 71 Total Cholesterol/HDL:CHD Risk Coronary Heart  Disease Risk Table Men   Women 1/2 Average Risk   3.4   3.3 74 -   HDL >39 mg/dL - 27(L) 38(L) -   Trlycerides <150 mg/dL - 186(H) 65 -   Hemoglobin A1c 4.8 - 5.6 % 6.3 (NOTE)   The ADA recommends the following therapeutic goals for glycemic   control related to Hgb A1C measurement:   Goal of Therapy:   < 7.0% Hgb A1C   Action Suggested:  > 8.0% Hgb A1C   Ref:  Diabetes Care, 22, Suppl. 1, 1999(H) - - 6.0(H)      Capillary Blood Glucose: Lab Results  Component Value Date   GLUCAP 117 (H) 08/08/2016   GLUCAP 144 (H) 07/03/2014   GLUCAP 124 (H) 06/30/2014   GLUCAP 132 (H) 06/28/2014   GLUCAP 105 (H) 06/26/2014     Exercise Target Goals:    Exercise Program Goal: Individual exercise prescription set with THRR, safety & activity barriers. Participant demonstrates ability to understand and report RPE using BORG  scale, to self-measure pulse accurately, and to acknowledge the importance of the exercise prescription.  Exercise Prescription Goal: Starting with aerobic activity 30 plus minutes a day, 3 days per week for initial exercise prescription. Provide home exercise prescription and guidelines that participant acknowledges understanding prior to discharge.  Activity Barriers & Risk Stratification:     Activity Barriers & Cardiac Risk Stratification - 07/31/16 1103      Activity Barriers & Cardiac Risk Stratification   Activity Barriers Back Problems;Arthritis;Balance Concerns;Shortness of Breath;Muscular Weakness;Deconditioning;Other (comment)   Comments L hip pain   Cardiac Risk Stratification High      6 Minute Walk:     6 Minute Walk    Row Name 07/31/16 0944 07/31/16 1105       6 Minute Walk   Phase Initial  -    Distance 1200 feet  -    Walk Time 6 minutes  -    # of Rest Breaks 0  -    MPH 2.3  -    METS 2  -    RPE 14  -    VO2 Peak 7.2  -    Symptoms No  -    Resting HR 56 bpm  -    Resting BP 112/60  -    Max Ex. HR 72 bpm  -    Max Ex. BP 138/66  -    2 Minute Post BP  - 132/62       Oxygen Initial Assessment:   Oxygen Re-Evaluation:   Oxygen Discharge (Final Oxygen Re-Evaluation):   Initial Exercise Prescription:     Initial Exercise Prescription - 07/31/16 1100      Date of Initial Exercise RX and Referring Provider   Date 07/31/16   Referring Provider Kela Millin MD     Oxygen   Oxygen Continuous   Liters 2     Recumbant Bike   Level 2   Minutes 10   METs 1.7     NuStep   Level 2   SPM 70   Minutes 10   METs 2     Track   Laps 8   Minutes 10   METs 2.39     Prescription Details   Frequency (times per week) 3   Duration Progress to 30 minutes of continuous aerobic without signs/symptoms of physical distress     Intensity   THRR 40-80% of Max Heartrate 58-115   Ratings of Perceived Exertion  11-13   Perceived Dyspnea 0-4      Progression   Progression Continue to progress workloads to maintain intensity without signs/symptoms of physical distress.     Resistance Training   Training Prescription Yes   Weight 2lbs   Reps 10-15      Perform Capillary Blood Glucose checks as needed.  Exercise Prescription Changes:      Exercise Prescription Changes    Row Name 08/12/16 1300 08/18/16 1100 09/05/16 1433 09/22/16 1400 09/29/16 1639     Response to Exercise   Blood Pressure (Admit) 122/60  - 104/60 122/60 110/60   Blood Pressure (Exercise) 122/70  - 144/70 122/70 138/50   Blood Pressure (Exit) 112/72  - 122/68 112/70 112/72   Heart Rate (Admit) 61 bpm  - 57 bpm 60 bpm 64 bpm   Heart Rate (Exercise) 81 bpm  - 101 bpm 84 bpm 78 bpm   Heart Rate (Exit) 61 bpm  - 59 bpm 60 bpm 64 bpm   Rating of Perceived Exertion (Exercise) 13  - '12 13 13   ' Symptoms none  - none none none   Duration Progress to 30 minutes of  aerobic without signs/symptoms of physical distress  - Continue with 30 min of aerobic exercise without signs/symptoms of physical distress. Continue with 30 min of aerobic exercise without signs/symptoms of physical distress. Continue with 30 min of aerobic exercise without signs/symptoms of physical distress.   Intensity THRR unchanged  - THRR unchanged THRR unchanged THRR unchanged     Progression   Progression  -  - Continue to progress workloads to maintain intensity without signs/symptoms of physical distress. Continue to progress workloads to maintain intensity without signs/symptoms of physical distress. Continue to progress workloads to maintain intensity without signs/symptoms of physical distress.   Average METs  -  - 2.5 2.5 2.5     Resistance Training   Training Prescription No  - Yes Yes Yes   Weight  -  - 2lbs 3lbs 3lbs   Reps  -  - 10-15 10-15 10-15   Time  -  - 10 Minutes 10 Minutes 10 Minutes     Oxygen   Oxygen Continuous  - Continuous Continuous Continuous   Liters 2  - '2 2 2      ' Recumbant Bike   Level 2  - 2 2 2.5   Minutes 10  - '10 10 10   ' METs 1.7  - '2 2 3     ' NuStep   Level 2  - '2 3 3   ' SPM 70  - 80 80 80   Minutes 10  - '10 10 10   ' METs 2.1  - 2.3 2.3 2.5     Track   Laps 7  - '10 10 7   ' Minutes 10  - '10 10 10   ' METs 2.23  - 2.74 2.74 2.23     Home Exercise Plan   Plans to continue exercise at  - [employer] (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 Add 3 additional days to program exercise sessions. Add 3 additional days to program exercise sessions. Add 3 additional days to program exercise sessions.   Initial Home Exercises Provided  -  - 08/18/16 08/18/16 08/18/16   Row Name 10/21/16 1600             Response to Exercise   Blood Pressure (Admit)  108/68       Blood Pressure (Exercise) 150/76       Blood Pressure (Exit) 116/62       Heart Rate (Admit) 63 bpm       Heart Rate (Exercise) 80 bpm       Heart Rate (Exit) 55 bpm       Rating of Perceived Exertion (Exercise) 12       Symptoms none       Duration Continue with 30 min of aerobic exercise without signs/symptoms of physical distress.       Intensity THRR unchanged         Progression   Progression Continue to progress workloads to maintain intensity without signs/symptoms of physical distress.       Average METs 2.8         Resistance Training   Training Prescription Yes       Weight 3lbs       Reps 10-15       Time 10 Minutes         Oxygen   Oxygen Continuous       Liters 2         Recumbant Bike   Level 2.5       Minutes 10       METs 3.1         NuStep   Level 3       SPM 80       Minutes 10       METs 2.5         Track   Laps 11       Minutes 10       METs 2.92         Home Exercise Plan   Plans to continue exercise at [employer] (comment)       Frequency Add 3 additional days to program exercise sessions.        Initial Home Exercises Provided 08/18/16          Exercise Comments:      Exercise Comments    Row Name 08/18/16 1157 09/22/16 1439 10/21/16 1643       Exercise Comments Home exercise completed Reviewed METs and goals. Pt is tolerating exercise well; pt will continue to monitor pt's progress. Reviewed METs and goals. Pt is tolerating exercise well; pt will continue to monitor pt's progress.        Exercise Goals and Review:      Exercise Goals    Row Name 07/31/16 1101             Exercise Goals   Increase Physical Activity Yes       Intervention Provide advice, education, support and counseling about physical activity/exercise needs.;Develop an individualized exercise prescription for aerobic and resistive training based on initial evaluation findings, risk stratification, comorbidities and participant's personal goals.       Expected Outcomes Achievement of increased cardiorespiratory fitness and enhanced flexibility, muscular endurance and strength shown through measurements of functional capacity and personal statement of participant.       Increase Strength and Stamina Yes  improve symptoms of SOB with climbing stairs       Intervention Provide advice, education, support and counseling about physical activity/exercise needs.;Develop an individualized exercise prescription for aerobic and resistive training based on initial evaluation findings, risk stratification, comorbidities and participant's personal goals.       Expected Outcomes Achievement of increased cardiorespiratory fitness and enhanced flexibility, muscular  endurance and strength shown through measurements of functional capacity and personal statement of participant.          Exercise Goals Re-Evaluation :     Exercise Goals Re-Evaluation    Row Name 08/18/16 1611 08/26/16 1610 09/22/16 1438 10/21/16 1635       Exercise Goal Re-Evaluation   Exercise Goals Review  - Increase Physical Activity;Increase  Strenth and Stamina Increase Physical Activity;Increase Strenth and Stamina Increase Physical Activity;Increase Strenth and Stamina    Comments Reviewed home exercise with pt today.  Pt plans to walk at [employer] and other box stores for exercise.  Reviewed THR, pulse, RPE, sign and symptoms, NTG use, and when to call 911 or MD.  Also discussed weather considerations and indoor options.  Pt voiced understanding.  - Pt is tolerating WL increases very well. Pt has also increased in walking tolerance and is doing well with HEP. Pt is making progress and increasing Nustep MET average on consistent basis.    Expected Outcomes Pt will be compliant with HEP and build on walking tolerance and overall cardiorespiratory fitness  - Pt will be compliant with HEP and build on walking tolerance and overall cardiorespiratory fitness Pt will  build on activity levels and MET average as well as overall cardiorespiratory fitness        Discharge Exercise Prescription (Final Exercise Prescription Changes):     Exercise Prescription Changes - 10/21/16 1600      Response to Exercise   Blood Pressure (Admit) 108/68   Blood Pressure (Exercise) 150/76   Blood Pressure (Exit) 116/62   Heart Rate (Admit) 63 bpm   Heart Rate (Exercise) 80 bpm   Heart Rate (Exit) 55 bpm   Rating of Perceived Exertion (Exercise) 12   Symptoms none   Duration Continue with 30 min of aerobic exercise without signs/symptoms of physical distress.   Intensity THRR unchanged     Progression   Progression Continue to progress workloads to maintain intensity without signs/symptoms of physical distress.   Average METs 2.8     Resistance Training   Training Prescription Yes   Weight 3lbs   Reps 10-15   Time 10 Minutes     Oxygen   Oxygen Continuous   Liters 2     Recumbant Bike   Level 2.5   Minutes 10   METs 3.1     NuStep   Level 3   SPM 80   Minutes 10   METs 2.5     Track   Laps 11   Minutes 10   METs 2.92     Home  Exercise Plan   Plans to continue exercise at [employer] (comment)   Frequency Add 3 additional days to program exercise sessions.   Initial Home Exercises Provided 08/18/16      Nutrition:  Target Goals: Understanding of nutrition guidelines, daily intake of sodium <1522m, cholesterol <2076m calories 30% from fat and 7% or less from saturated fats, daily to have 5 or more servings of fruits and vegetables.  Biometrics:     Pre Biometrics - 07/31/16 1102      Pre Biometrics   Height 5' 4.5" (1.638 m)   Weight 170 lb 13.7 oz (77.5 kg)   Waist Circumference 38.25 inches   Hip Circumference 42 inches   Waist to Hip Ratio 0.91 %   BMI (Calculated) 28.9   Triceps Skinfold 37 mm   % Body Fat 42.8 %   Grip Strength 28 kg  Flexibility 0 in  LBP!   Single Leg Stand 15 seconds       Nutrition Therapy Plan and Nutrition Goals:     Nutrition Therapy & Goals - 09/15/16 1347      Nutrition Therapy   Diet Carb Modified, Therapeutic Lifestyle Changes     Personal Nutrition Goals   Nutrition Goal Wt loss of 1-2 lb/week to a wt loss goal of 6-10 lb at graduation from [place], educate and counsel regarding individualized specific dietary modifications aiming towards targeted core components such as weight, hypertension, lipid management, diabetes, heart failure and other comorbidities.   Expected Outcomes Short Term Goal: Understand basic principles of dietary content, such as calories, fat, sodium, cholesterol and nutrients.;Long Term Goal: Adherence to prescribed nutrition plan.      Nutrition Discharge: Nutrition Scores:     Nutrition Assessments - 09/15/16 1347      MEDFICTS Scores   Pre Score 3      Nutrition Goals Re-Evaluation:   Nutrition Goals Re-Evaluation:   Nutrition Goals Discharge (Final Nutrition Goals Re-Evaluation):   Psychosocial: Target Goals: Acknowledge presence or absence of  significant depression and/or stress, maximize coping skills, provide positive support system. Participant is able to verbalize types and ability to use techniques and skills needed for reducing stress and depression.  Initial Review & Psychosocial Screening:     Initial Psych Review & Screening - 07/31/16 1651      Initial Review   Current issues with Current Depression;Current Anxiety/Panic  husband passes away 2 years ago. health related anxiety.     Family Dynamics   Good Support System? Yes     Barriers   Psychosocial barriers to participate in program The patient should benefit from training in stress management and relaxation.      Quality of Life Scores:     Quality of Life - 07/31/16 1048      Quality of Life Scores   Health/Function Pre 10.29 %   Socioeconomic Pre 24.5 %   Psych/Spiritual Pre 18.64 %   Family Pre 24 %   GLOBAL Pre 16.94 %      PHQ-9: Recent Review Flowsheet Data    Depression screen [facility] 2/9 08/08/2016 09/22/2014 06/12/2014   Decreased Interest 0 0 1    Down, Depressed, Hopeless 1 0 1    PHQ - 2 Score 1 0 2   Altered sleeping - - 0   Tired, decreased energy - - 3    Change in appetite - - 0   Feeling bad or failure about yourself  - - 0   Trouble concentrating - - 0   Moving slowly or fidgety/restless - - 0   Suicidal thoughts - - 0   PHQ-9 Score - - 5   Difficult doing work/chores - - Somewhat difficult     Interpretation of Total Score  Total Score Depression Severity:  1-4 = Minimal depression, 5-9 = Mild depression, 10-14 = Moderate depression, 15-19 = Moderately severe depression, 20-27 = Severe depression   Psychosocial Evaluation and Intervention:     Psychosocial Evaluation - 11/18/16 2301      Psychosocial Evaluation & Interventions   Comments pt remarks improvement of her outlook on life.  pt is suported by her family.   Continue Psychosocial Services  No Follow up required      Psychosocial Re-Evaluation:      Psychosocial Re-Evaluation    [place] Name 08/26/16  [id number] 09/21/16 1641 10/20/16 1336 11/18/16 2301       Psychosocial Re-Evaluation   Current issues with Current Stress Concerns Current Stress Concerns Current Stress Concerns None Identified    Comments Pt worries about her grandson who she thinks may have autism Improvement on how she is coping with her grandson diagnosis.  Pt is limited within the home due to oxygen therapy in home based and pt does not qualify for oxygen use outside the home due to normal o2 sat readings Improvement on how she is coping with her grandson diagnosis.  Pt is limited within the home due to oxygen therapy in home based and pt does not qualify for oxygen use outside the home due to normal o2 sat readings Improvement on how she is coping with her grandson diagnosis.  Pt is limited within the home due to oxygen therapy in home based and pt does not qualify for oxygen use outside the home due to normal o2 sat readings    Interventions Relaxation education;Stress management education;Encouraged to attend Cardiac Rehabilitation for the exercise Relaxation education;Stress management education;Encouraged to attend Cardiac Rehabilitation for the exercise Relaxation education;Stress management education;Encouraged to attend Cardiac Rehabilitation for the exercise Relaxation education;Stress management education;Encouraged to attend Cardiac Rehabilitation for the exercise    Continue Psychosocial Services  Follow up required by staff Follow up required by staff Follow up required by staff No Follow up required      Initial Review   Source of Stress Concerns  - Family;Unable to participate in former interests or hobbies;Unable to perform yard/household activities  -  -       Psychosocial Discharge (Final Psychosocial Re-Evaluation):     Psychosocial Re-Evaluation - 11/18/16 2301      Psychosocial Re-Evaluation   Current issues with None Identified   Comments Improvement on  how she is coping with her grandson diagnosis.  Pt is limited within the home due to oxygen therapy in home based and pt does not qualify for oxygen use outside the home due to normal o2 sat readings   Interventions Relaxation education;Stress management education;Encouraged to attend Cardiac Rehabilitation for the exercise   Continue Psychosocial Services  No Follow up required      Vocational Rehabilitation: Provide vocational rehab assistance to qualifying candidates.   Vocational Rehab Evaluation & Intervention:     Vocational Rehab - 07/31/16 1654      Initial Vocational Rehab Evaluation & Intervention   Assessment shows need for Vocational Rehabilitation No      Education: Education Goals: Education classes will be provided on a weekly basis, covering required topics. Participant will state understanding/return demonstration of topics presented.  Learning Barriers/Preferences:     Learning Barriers/Preferences - 07/31/16 1104      Learning Barriers/Preferences   Learning Barriers Sight  cataract surgery   Learning Preferences Skilled Demonstration;Verbal Instruction;Written Material;Pictoral      Education Topics: Count Your Pulse:  -Group instruction provided by verbal instruction, demonstration, patient participation and written materials to support subject.  Instructors address importance of being able to find your pulse and how to count your pulse when at home without a heart monitor.  Patients get hands on experience counting their pulse with staff help and individually.   CARDIAC REHAB PHASE II EXERCISE from 10/31/2016 in [place]  Date  09/12/16  Instruction Review Code  2- meets goals/outcomes      Heart Attack, Angina, and Risk Factor Modification:  -Group instruction provided by verbal  instruction, video, and written materials to support subject.  Instructors address signs and symptoms of angina and heart attacks.    Also  discuss risk factors for heart disease and how to make changes to improve heart health risk factors.   Functional Fitness:  -Group instruction provided by verbal instruction, demonstration, patient participation, and written materials to support subject.  Instructors address safety measures for doing things around the house.  Discuss how to get up and down off the floor, how to pick things up properly, how to safely get out of a chair without assistance, and balance training.   CARDIAC REHAB PHASE II EXERCISE from 10/31/2016 in [place]  Date  08/22/16  Instruction Review Code  2- meets goals/outcomes      Meditation and Mindfulness:  -Group instruction provided by verbal instruction, patient participation, and written materials to support subject.  Instructor addresses importance of mindfulness and meditation practice to help reduce stress and improve awareness.  Instructor also leads participants through a meditation exercise.    Stretching for Flexibility and Mobility:  -Group instruction provided by verbal instruction, patient participation, and written materials to support subject.  Instructors lead participants through series of stretches that are designed to increase flexibility thus improving mobility.  These stretches are additional exercise for major muscle groups that are typically performed during regular warm up and cool down.   CARDIAC REHAB PHASE II EXERCISE from 10/31/2016 in [place]  Date  10/31/16  Educator  EP  Instruction Review Code  2- meets goals/outcomes      Hands Only CPR:  -Group verbal, video, and participation provides a basic overview of AHA guidelines for community CPR. Role-play of emergencies allow participants the opportunity to practice calling for help and chest compression technique with discussion of AED use.   Hypertension: -Group verbal and written instruction that provides a basic  overview of hypertension including the most recent diagnostic guidelines, risk factor reduction with self-care instructions and medication management.   CARDIAC REHAB PHASE II EXERCISE from 10/31/2016 in [place]  Date  10/17/16  Instruction Review Code  2- meets goals/outcomes       Nutrition I class: Heart Healthy Eating:  -Group instruction provided by PowerPoint slides, verbal discussion, and written materials to support subject matter. The instructor gives an explanation and review of the Therapeutic Lifestyle Changes diet recommendations, which includes a discussion on lipid goals, dietary fat, sodium, fiber, plant stanol/sterol esters, sugar, and the components of a well-balanced, healthy diet.   Nutrition II class: Lifestyle Skills:  -Group instruction provided by PowerPoint slides, verbal discussion, and written materials to support subject matter. The instructor gives an explanation and review of label reading, grocery shopping for heart health, heart healthy recipe modifications, and ways to make healthier choices when eating out.   Diabetes Question & Answer:  -Group instruction provided by PowerPoint slides, verbal discussion, and written materials to support subject matter. The instructor gives an explanation and review of diabetes co-morbidities, pre- and post-prandial blood glucose goals, pre-exercise blood glucose goals, signs, symptoms, and treatment of hypoglycemia and hyperglycemia, and foot care basics.   CARDIAC REHAB PHASE II EXERCISE from 10/31/2016 in [place]  Date  09/19/16  Educator  RD  Instruction Review Code  2- meets goals/outcomes      Diabetes Blitz:  -Group instruction provided by PowerPoint slides, verbal discussion, and written materials to support subject matter.  The instructor gives an explanation and review of the physiology behind type 1 and type 2 diabetes, diabetes medications and  rational behind using different medications, pre- and post-prandial blood glucose recommendations and Hemoglobin A1c goals, diabetes diet, and exercise including blood glucose guidelines for exercising safely.    Portion Distortion:  -Group instruction provided by PowerPoint slides, verbal discussion, written materials, and food models to support subject matter. The instructor gives an explanation of serving size versus portion size, changes in portions sizes over the last 20 years, and what consists of a serving from each food group.   Stress Management:  -Group instruction provided by verbal instruction, video, and written materials to support subject matter.  Instructors review role of stress in heart disease and how to cope with stress positively.     Exercising on Your Own:  -Group instruction provided by verbal instruction, power point, and written materials to support subject.  Instructors discuss benefits of exercise, components of exercise, frequency and intensity of exercise, and end points for exercise.  Also discuss use of nitroglycerin and activating EMS.  Review options of places to exercise outside of rehab.  Review guidelines for sex with heart disease.   Cardiac Drugs I:  -Group instruction provided by verbal instruction and written materials to support subject.  Instructor reviews cardiac drug classes: antiplatelets, anticoagulants, beta blockers, and statins.  Instructor discusses reasons, side effects, and lifestyle considerations for each drug class.   Cardiac Drugs II:  -Group instruction provided by verbal instruction and written materials to support subject.  Instructor reviews cardiac drug classes: angiotensin converting enzyme inhibitors (ACE-I), angiotensin II receptor blockers (ARBs), nitrates, and calcium channel blockers.  Instructor discusses reasons, side effects, and lifestyle considerations for each drug class.   Anatomy and Physiology of the Circulatory  System:  Group verbal and written instruction and models provide basic cardiac anatomy and physiology, with the coronary electrical and arterial systems. Review of: AMI, Angina, Valve disease, Heart Failure, Peripheral Artery Disease, Cardiac Arrhythmia, Pacemakers, and the ICD.   Other Education:  -Group or individual verbal, written, or video instructions that support the educational goals of the cardiac rehab program.   Knowledge Questionnaire Score:     Knowledge Questionnaire Score - 07/31/16 1034      Knowledge Questionnaire Score   Pre Score 24/24      Core Components/Risk Factors/Patient Goals at Admission:     Personal Goals and Risk Factors at Admission - 07/31/16 1048      Core Components/Risk Factors/Patient Goals on Admission    Weight Management Yes;Weight Maintenance;Weight Loss   Intervention Weight Management: Develop a combined nutrition and exercise program designed to reach desired caloric intake, while maintaining appropriate intake of nutrient and fiber, sodium and fats, and appropriate energy expenditure required for the weight goal.;Weight Management: Provide education and appropriate resources to help participant work on and attain dietary goals.;Weight Management/Obesity: Establish reasonable short term and long term weight goals.   Expected Outcomes Short Term: Continue to assess and modify interventions until short term weight is achieved;Long Term: Adherence to nutrition and physical activity/exercise program aimed toward attainment of established weight goal;Weight Maintenance: Understanding of the daily nutrition guidelines, which includes 25-35% calories from fat, 7% or less cal from saturated fats, less than 216m cholesterol, less than 1.5gm of sodium, & 5 or more servings of fruits and vegetables daily;Weight Loss: Understanding of general recommendations for a balanced deficit meal plan, which promotes 1-2 lb weight loss per week and includes a negative  energy balance of [id number] kcal/d;Understanding recommendations for meals to include 15-35% energy as protein, 25-35% energy from fat, 35-60% energy from carbohydrates, less than 214m of dietary cholesterol, 20-35 gm of total fiber daily;Understanding of distribution of calorie intake throughout the day with the consumption of 4-5 meals/snacks   Improve shortness of breath with ADL's Yes   Intervention Provide education, individualized exercise plan and daily activity instruction to help decrease symptoms of SOB with activities of daily living.   Expected Outcomes Short Term: Achieves a reduction of symptoms when performing activities of daily living.   Diabetes Yes   Intervention Provide education about signs/symptoms and action to take for hypo/hyperglycemia.;Provide education about proper nutrition, including hydration, and aerobic/resistive exercise prescription along with prescribed medications to achieve blood glucose in normal ranges: Fasting glucose 65-99 mg/dL   Expected Outcomes Short Term: Participant verbalizes understanding of the signs/symptoms and immediate care of hyper/hypoglycemia, proper foot care and importance of medication, aerobic/resistive exercise and nutrition plan for blood glucose control.;Long Term: Attainment of HbA1C < 7%.   Hypertension Yes   Intervention Provide education on lifestyle modifcations including regular physical activity/exercise, weight management, moderate sodium restriction and increased consumption of fresh fruit, vegetables, and low fat dairy, alcohol moderation, and smoking cessation.;Monitor prescription use compliance.   Expected Outcomes Short Term: Continued assessment and intervention until BP is < 140/954mHG in hypertensive participants. < 130/802mG in hypertensive participants with diabetes, heart failure or chronic kidney disease.;Long Term: Maintenance of blood pressure at goal levels.   Lipids Yes   Intervention Provide education and support  for participant on nutrition & aerobic/resistive exercise along with prescribed medications to achieve LDL <62m65mDL >40mg34mExpected Outcomes Short Term: Participant states understanding of desired cholesterol values and is compliant with medications prescribed. Participant is following exercise prescription and nutrition guidelines.;Long Term: Cholesterol controlled with medications as prescribed, with individualized exercise RX and with personalized nutrition plan. Value goals: LDL < 62mg,68m > 40 mg.   Stress Yes   Intervention Offer individual and/or small group education and counseling on adjustment to heart disease, stress management and health-related lifestyle change. Teach and support self-help strategies.;Refer participants experiencing significant psychosocial distress to appropriate mental health specialists for further evaluation and treatment. When possible, include family members and significant others in education/counseling sessions.   Expected Outcomes Short Term: Participant demonstrates changes in health-related behavior, relaxation and other stress management skills, ability to obtain effective social support, and compliance with psychotropic medications if prescribed.;Long Term: Emotional wellbeing is indicated by absence of clinically significant psychosocial distress or social isolation.      Core Components/Risk Factors/Patient Goals Review:      Goals and Risk Factor Review    Row Name 08/26/16 0036 09/21/16 1638 11/18/16 2301         Core Components/Risk Factors/Patient Goals Review   Personal Goals Review Weight Management/Obesity;Improve shortness of breath with ADL's;Develop more efficient breathing techniques such as purse lipped breathing and diaphragmatic breathing and practicing self-pacing with activity.;Lipids;Hypertension;Stress;Diabetes Weight Management/Obesity;Improve shortness of breath with ADL's;Develop more efficient breathing techniques such as purse  lipped breathing and diaphragmatic breathing and practicing self-pacing with activity.;Lipids;Hypertension;Stress;Diabetes Weight Management/Obesity;Improve shortness of breath with ADL's;Develop more efficient breathing techniques such as purse lipped breathing and diaphragmatic breathing and practicing self-pacing with activity.;Lipids;Hypertension;Stress;Diabetes     Review Pt is off to a good start after completing 6 sessions.   Pt consistently attends twice a week.  Pt has noticed that she is better able to manage  her angina now that she is exercising in the rehab program.   Pt consistently attends twice a week.  Pt has noticed that she is better able to manage  her angina now that she is exercising in the rehab program.       Expected Outcomes Maintain healthy weight, improve sob with ADL's, lipid values, diabetes is diet controled, bp within normal limits and positive and healthy coping skills.  Maintain healthy weight, improve sob with ADL's, lipid values, diabetes is diet controled, bp within normal limits and positive and healthy coping skills.  Maintain healthy weight, improve sob with ADL's, lipid values, diabetes is diet controled, bp within normal limits and positive and healthy coping skills.         Core Components/Risk Factors/Patient Goals at Discharge (Final Review):      Goals and Risk Factor Review - 11/18/16 2301      Core Components/Risk Factors/Patient Goals Review   Personal Goals Review Weight Management/Obesity;Improve shortness of breath with ADL's;Develop more efficient breathing techniques such as purse lipped breathing and diaphragmatic breathing and practicing self-pacing with activity.;Lipids;Hypertension;Stress;Diabetes   Review Pt consistently attends twice a week.  Pt has noticed that she is better able to manage  her angina now that she is exercising in the rehab program.     Expected Outcomes Maintain healthy weight, improve sob with ADL's, lipid values, diabetes  is diet controled, bp within normal limits and positive and healthy coping skills.       ITP Comments:     ITP Comments    Row Name 07/31/16 0936           ITP Comments Medical Director, Dr. Fransico Him          Comments:  Escarlet is making expected progress toward personal goals after completing 24 sessions. Pt consistently attends twice a week. Pt has noticed reduction in angina episodes.  Pt using relaxation techniques to assist her when she is in a stressful environment. Psychosocial Assessment reveals improvement in outlook on life. Pt feels she is making progress with improvement of angina episodes. Pt has noticed that her symptoms are not as intense as before when she wasn't exercising. Pt remains concerned about her grandson but remains optimistic.  Recommend continued exercise and life style modification education including  stress management and relaxation techniques to decrease cardiac risk profile. Cherre Huger, BSN Cardiac and Training and development officer

## 2016-11-21 ENCOUNTER — Encounter (HOSPITAL_COMMUNITY): Payer: Medicare Other

## 2016-11-24 ENCOUNTER — Telehealth (HOSPITAL_COMMUNITY): Payer: Self-pay | Admitting: Internal Medicine

## 2016-11-24 ENCOUNTER — Encounter (HOSPITAL_COMMUNITY): Payer: Medicare Other

## 2016-11-28 ENCOUNTER — Encounter (HOSPITAL_COMMUNITY)
Admission: RE | Admit: 2016-11-28 | Discharge: 2016-11-28 | Disposition: A | Discharge status: Home / Self Care | Payer: Medicare Other | Source: Ambulatory Visit | Attending: Cardiology | Admitting: Cardiology

## 2016-11-28 VITALS — Ht 64.5 in | Wt 167.3 lb

## 2016-11-28 DIAGNOSIS — I208 Other forms of angina pectoris: Secondary | ICD-10-CM

## 2016-12-01 ENCOUNTER — Encounter (HOSPITAL_COMMUNITY)
Admission: RE | Admit: 2016-12-01 | Discharge: 2016-12-01 | Disposition: A | Discharge status: Home / Self Care | Payer: Medicare Other | Source: Ambulatory Visit | Attending: Cardiology | Admitting: Cardiology

## 2016-12-01 DIAGNOSIS — I208 Other forms of angina pectoris: Secondary | ICD-10-CM | POA: Diagnosis not present

## 2016-12-01 NOTE — Progress Notes (Signed)
Discharge Progress Report  Patient Details  Name: Desiree Leon MRN: [id number] Date of Birth: [birth year] Referring Provider:     CARDIAC REHAB PHASE II ORIENTATION from 07/31/2016 in [place]  Referring Provider  Kela Millin MD       Number of Visits: 27  Reason for Discharge:  Patient reached a stable level of exercise. Early Exit:  pt in program attend 2 x week for 18 weeks  Smoking History:  History  Smoking Status  . Former Smoker  . Quit date: 03/10/2005  Smokeless Tobacco  . Never Used    Diagnosis:  Stable angina ([place])  ADL [facility]:   Initial Exercise Prescription:     Initial Exercise Prescription - 07/31/16 1100      Date of Initial Exercise RX and Referring Provider   Date 07/31/16   Referring Provider Kela Millin MD     Oxygen   Oxygen Continuous   Liters 2     Recumbant Bike   Level 2   Minutes 10   METs 1.7     NuStep   Level 2   SPM 70   Minutes 10   METs 2     Track   Laps 8   Minutes 10   METs 2.39     Prescription Details   Frequency (times per week) 3   Duration Progress to 30 minutes of continuous aerobic without signs/symptoms of physical distress     Intensity   THRR 40-80% of Max Heartrate 58-115   Ratings of Perceived Exertion 11-13   Perceived Dyspnea 0-4     Progression   Progression Continue to progress workloads to maintain intensity without signs/symptoms of physical distress.     Resistance Training   Training Prescription Yes   Weight 2lbs   Reps 10-15      Discharge Exercise Prescription (Final Exercise Prescription Changes):     Exercise Prescription Changes - 11/28/16 1600      Response to Exercise   Blood Pressure (Admit) 124/60   Blood Pressure (Exercise) 120/60   Blood Pressure (Exit) 104/60   Heart Rate (Admit) 62 bpm   Heart Rate (Exercise) 80 bpm   Heart Rate (Exit) 62 bpm   Rating of Perceived Exertion (Exercise) 12   Symptoms none   Duration  Continue with 30 min of aerobic exercise without signs/symptoms of physical distress.   Intensity THRR unchanged     Progression   Progression Continue to progress workloads to maintain intensity without signs/symptoms of physical distress.   Average METs 2.94     Resistance Training   Training Prescription Yes   Weight 3lbs   Reps 10-15   Time 10 Minutes     Oxygen   Oxygen Continuous   Liters 2     Recumbant Bike   Level 2.5   Minutes 10   METs 3.5     NuStep   Level 3   SPM 80   Minutes 10   METs 2.4     Track   Laps 11   Minutes 10   METs 2.92     Home Exercise Plan   Plans to continue exercise at [employer] (comment)   Frequency Add 3 additional days to program exercise sessions.   Initial Home Exercises Provided 08/18/16      Functional Capacity:     6 Minute Walk    Row Name 07/31/16 0944 07/31/16 1105 11/28/16 1532     6 Minute  Walk   Phase Initial  - Discharge   Distance 1200 feet  - 1531 feet   Walk Time 6 minutes  - 6 minutes   # of Rest Breaks 0  - 0   MPH 2.3  - 2.89   METS 2  - 2.69   RPE 14  - 15   Perceived Dyspnea   -  - 3   VO2 Peak 7.2  - 9.4   Symptoms No  -  -   Resting HR 56 bpm  -  -   Resting BP 112/60  -  -   Max Ex. HR 72 bpm  -  -   Max Ex. BP 138/66  -  -   2 Minute Post BP  - 132/62  -   Row Name 11/28/16 1537         6 Minute Walk   Distance % Change 27.58 %     Distance Feet Change 331 ft     Symptoms Yes (comment)     Comments fatigue/SOB     Resting HR 57 bpm     Resting BP 114/70     Resting Oxygen Saturation  99 %     Exercise Oxygen Saturation  during 6 min walk 98 %     Max Ex. HR 83 bpm     Max Ex. BP 126/58     2 Minute Post BP 108/60        Psychological, QOL, Others - Outcomes: PHQ 2/9: Depression screen [facility] 2/9 12/01/2016 08/08/2016 09/22/2014 06/12/2014  Decreased Interest 2 0 0 1  Down, Depressed, Hopeless 1 1 0 1  PHQ - 2 Score 3 1 0 2  Altered sleeping 2 - - 0  Tired, decreased  energy 2 - - 3  Change in appetite 0 - - 0  Feeling bad or failure about yourself  1 - - 0  Trouble concentrating 0 - - 0  Moving slowly or fidgety/restless 0 - - 0  Suicidal thoughts 0 - - 0  PHQ-9 Score 8 - - 5  Difficult doing work/chores Somewhat difficult - - Somewhat difficult    Quality of Life:     Quality of Life - 12/01/16 1109      Quality of Life Scores   Health/Function Pre 10.29 %   Health/Function Post 16.32 %   Health/Function % Change 58.6 %   Socioeconomic Pre 24.5 %   Socioeconomic Post 25.93 %   Socioeconomic % Change  5.84 %   Psych/Spiritual Pre 18.64 %   Psych/Spiritual Post 24 %   Psych/Spiritual % Change 28.76 %   Family Pre 24 %   Family Post 28.5 %   Family % Change 18.75 %   GLOBAL Pre 16.94 %   GLOBAL Post 21.63 %   GLOBAL % Change 27.69 %      Personal Goals: Goals established at orientation with interventions provided to work toward goal.     Personal Goals and Risk Factors at Admission - 07/31/16 1048      Core Components/Risk Factors/Patient Goals on Admission    Weight Management Yes;Weight Maintenance;Weight Loss   Intervention Weight Management: Develop a combined nutrition and exercise program designed to reach desired caloric intake, while maintaining appropriate intake of nutrient and fiber, sodium and fats, and appropriate energy expenditure required for the weight goal.;Weight Management: Provide education and appropriate resources to help participant work on and attain dietary goals.;Weight Management/Obesity: Establish reasonable short term and long  term weight goals.   Expected Outcomes Short Term: Continue to assess and modify interventions until short term weight is achieved;Long Term: Adherence to nutrition and physical activity/exercise program aimed toward attainment of established weight goal;Weight Maintenance: Understanding of the daily nutrition guidelines, which includes 25-35% calories from fat, 7% or less cal from  saturated fats, less than 279m cholesterol, less than 1.5gm of sodium, & 5 or more servings of fruits and vegetables daily;Weight Loss: Understanding of general recommendations for a balanced deficit meal plan, which promotes 1-2 lb weight loss per week and includes a negative energy balance of [phone] kcal/d;Understanding recommendations for meals to include 15-35% energy as protein, 25-35% energy from fat, 35-60% energy from carbohydrates, less than 2056mof dietary cholesterol, 20-35 gm of total fiber daily;Understanding of distribution of calorie intake throughout the day with the consumption of 4-5 meals/snacks   Improve shortness of breath with ADL's Yes   Intervention Provide education, individualized exercise plan and daily activity instruction to help decrease symptoms of SOB with activities of daily living.   Expected Outcomes Short Term: Achieves a reduction of symptoms when performing activities of daily living.   Diabetes Yes   Intervention Provide education about signs/symptoms and action to take for hypo/hyperglycemia.;Provide education about proper nutrition, including hydration, and aerobic/resistive exercise prescription along with prescribed medications to achieve blood glucose in normal ranges: Fasting glucose 65-99 mg/dL   Expected Outcomes Short Term: Participant verbalizes understanding of the signs/symptoms and immediate care of hyper/hypoglycemia, proper foot care and importance of medication, aerobic/resistive exercise and nutrition plan for blood glucose control.;Long Term: Attainment of HbA1C < 7%.   Hypertension Yes   Intervention Provide education on lifestyle modifcations including regular physical activity/exercise, weight management, moderate sodium restriction and increased consumption of fresh fruit, vegetables, and low fat dairy, alcohol moderation, and smoking cessation.;Monitor prescription use compliance.   Expected Outcomes Short Term: Continued assessment and  intervention until BP is < 140/9011mG in hypertensive participants. < 130/58m15m in hypertensive participants with diabetes, heart failure or chronic kidney disease.;Long Term: Maintenance of blood pressure at goal levels.   Lipids Yes   Intervention Provide education and support for participant on nutrition & aerobic/resistive exercise along with prescribed medications to achieve LDL <70mg65mL >40mg.81mxpected Outcomes Short Term: Participant states understanding of desired cholesterol values and is compliant with medications prescribed. Participant is following exercise prescription and nutrition guidelines.;Long Term: Cholesterol controlled with medications as prescribed, with individualized exercise RX and with personalized nutrition plan. Value goals: LDL < 70mg, 43m> 40 mg.   Stress Yes   Intervention Offer individual and/or small group education and counseling on adjustment to heart disease, stress management and health-related lifestyle change. Teach and support self-help strategies.;Refer participants experiencing significant psychosocial distress to appropriate mental health specialists for further evaluation and treatment. When possible, include family members and significant others in education/counseling sessions.   Expected Outcomes Short Term: Participant demonstrates changes in health-related behavior, relaxation and other stress management skills, ability to obtain effective social support, and compliance with psychotropic medications if prescribed.;Long Term: Emotional wellbeing is indicated by absence of clinically significant psychosocial distress or social isolation.       Personal Goals Discharge:     Goals and Risk Factor Review    Row Name 08/26/16 0036 09/21/16 1638 11/18/16 2301         Core Components/Risk Factors/Patient Goals Review   Personal Goals Review Weight Management/Obesity;Improve shortness of breath with ADL's;Develop more efficient breathing techniques  such as purse lipped breathing and diaphragmatic breathing and practicing self-pacing with activity.;Lipids;Hypertension;Stress;Diabetes Weight Management/Obesity;Improve shortness of breath with ADL's;Develop more efficient breathing techniques such as purse lipped breathing and diaphragmatic breathing and practicing self-pacing with activity.;Lipids;Hypertension;Stress;Diabetes Weight Management/Obesity;Improve shortness of breath with ADL's;Develop more efficient breathing techniques such as purse lipped breathing and diaphragmatic breathing and practicing self-pacing with activity.;Lipids;Hypertension;Stress;Diabetes     Review Pt is off to a good start after completing 6 sessions.   Pt consistently attends twice a week.  Pt has noticed that she is better able to manage  her angina now that she is exercising in the rehab program.   Pt consistently attends twice a week.  Pt has noticed that she is better able to manage  her angina now that she is exercising in the rehab program.       Expected Outcomes Maintain healthy weight, improve sob with ADL's, lipid values, diabetes is diet controled, bp within normal limits and positive and healthy coping skills.  Maintain healthy weight, improve sob with ADL's, lipid values, diabetes is diet controled, bp within normal limits and positive and healthy coping skills.  Maintain healthy weight, improve sob with ADL's, lipid values, diabetes is diet controled, bp within normal limits and positive and healthy coping skills.         Exercise Goals and Review:     Exercise Goals    Row Name 07/31/16 1101             Exercise Goals   Increase Physical Activity Yes       Intervention Provide advice, education, support and counseling about physical activity/exercise needs.;Develop an individualized exercise prescription for aerobic and resistive training based on initial evaluation findings, risk stratification, comorbidities and participant's personal goals.        Expected Outcomes Achievement of increased cardiorespiratory fitness and enhanced flexibility, muscular endurance and strength shown through measurements of functional capacity and personal statement of participant.       Increase Strength and Stamina Yes  improve symptoms of SOB with climbing stairs       Intervention Provide advice, education, support and counseling about physical activity/exercise needs.;Develop an individualized exercise prescription for aerobic and resistive training based on initial evaluation findings, risk stratification, comorbidities and participant's personal goals.       Expected Outcomes Achievement of increased cardiorespiratory fitness and enhanced flexibility, muscular endurance and strength shown through measurements of functional capacity and personal statement of participant.          Nutrition & Weight - Outcomes:     Pre Biometrics - 07/31/16 1102      Pre Biometrics   Height 5' 4.5" (1.638 m)   Weight 170 lb 13.7 oz (77.5 kg)   Waist Circumference 38.25 inches   Hip Circumference 42 inches   Waist to Hip Ratio 0.91 %   BMI (Calculated) 28.9   Triceps Skinfold 37 mm   % Body Fat 42.8 %   Grip Strength 28 kg   Flexibility 0 in  LBP!   Single Leg Stand 15 seconds         Post Biometrics - 11/28/16 1539       Post  Biometrics   Height 5' 4.5" (1.638 m)   Weight 167 lb 5.3 oz (75.9 kg)   Waist Circumference 36.5 inches   Hip Circumference 40.5 inches   Waist to Hip Ratio 0.9 %   BMI (Calculated) 28.29   Triceps Skinfold 29 mm   % Body Fat 40.5 %  Grip Strength 26 kg   Flexibility 7.5 in   Single Leg Stand 30 seconds      Nutrition:     Nutrition Therapy & Goals - 09/15/16 1347      Nutrition Therapy   Diet Carb Modified, Therapeutic Lifestyle Changes     Personal Nutrition Goals   Nutrition Goal Wt loss of 1-2 lb/week to a wt loss goal of 6-10 lb at graduation from [facility], educate and counsel regarding individualized specific dietary modifications aiming towards targeted core components such as weight, hypertension, lipid management, diabetes, heart failure and other comorbidities.   Expected Outcomes Short Term Goal: Understand basic principles of dietary content, such as calories, fat, sodium, cholesterol and nutrients.;Long Term Goal: Adherence to prescribed nutrition plan.      Nutrition Discharge:     Nutrition Assessments - 12/17/16 1359      MEDFICTS Scores   Pre Score 3   Post Score 3   Score Difference 0      Education Questionnaire Score:     Knowledge Questionnaire Score - 12/01/16 1105      Knowledge Questionnaire Score   Post Score 24/24      Goals reviewed with patient. Pt graduated from cardiac rehab program today with completion of 27 exercise sessions in Phase II. Pt maintained good attendance coming 2 a week fairly consistently and progressed nicely during her participation in rehab as evidenced by increased MET level.   Medication list reconciled. Repeat  PHQ score-8.  Pt completed post assessment.  Pt scored the following:     Quality of Life - 12/01/16 1109      Quality of Life Scores   Health/Function Pre 10.29 %   Health/Function Post 16.32 %   Health/Function % Change 58.6 %   Socioeconomic Pre 24.5 %   Socioeconomic Post 25.93 %   Socioeconomic % Change  5.84 %   Psych/Spiritual Pre 18.64 %   Psych/Spiritual Post 24 %   Psych/Spiritual % Change 28.76 %   Family Pre 24 %   Family Post 28.5 %   Family % Change 18.75 %   GLOBAL Pre 16.94 %   GLOBAL Post 21.63 %   GLOBAL % Change 27.69 %      Pt continues to have several stressors with family and chronic illness.  Pt feels supported by her family and general feels mostly good.  Pt feel her medications are working well for her. Pt has made significant lifestyle changes and should be commended for her success. Pt feels she has made progress toward her  shortness of breath.  Pt has made some progress toward her goals.  Pt  still feels short of breath with ambulation on level surface and certainly when climbing stairs.  Pt feels she has increased her strength and stamina. Pt knows her limitations with her angina and mostly this is relieved with rest and occasionally the pain is so intense she needs NTG.  Pt understands she has microvascular small vessel disease and this is to be expected. Pt plans to continue exercise  at home with stretching she can do when she is wearing her oxygen. Pt unable to exercise outside of the home due to the need for oxygen for angina control.  Unfortunately Medicare will not pay for O2 due to she does not desat on room air with activity. It was a pleasure to work with this pt in cardiac rehab.Carlette Wellington  RN, BSN Cardiac and Pulmonary Rehab Nurse Navigator

## 2016-12-05 ENCOUNTER — Encounter (HOSPITAL_COMMUNITY): Admission: RE | Admit: 2016-12-05 | Payer: Medicare Other | Source: Ambulatory Visit

## 2016-12-08 ENCOUNTER — Encounter (HOSPITAL_COMMUNITY): Payer: Medicare Other

## 2016-12-12 ENCOUNTER — Encounter (HOSPITAL_COMMUNITY): Payer: Medicare Other

## 2016-12-15 ENCOUNTER — Encounter (HOSPITAL_COMMUNITY): Payer: Medicare Other

## 2016-12-19 ENCOUNTER — Encounter (HOSPITAL_COMMUNITY): Payer: Medicare Other

## 2016-12-22 ENCOUNTER — Encounter (HOSPITAL_COMMUNITY): Payer: Medicare Other

## 2016-12-26 ENCOUNTER — Encounter (HOSPITAL_COMMUNITY): Payer: Medicare Other

## 2017-01-02 DIAGNOSIS — D649 Anemia, unspecified: Secondary | ICD-10-CM | POA: Insufficient documentation

## 2017-05-04 DIAGNOSIS — E663 Overweight: Secondary | ICD-10-CM | POA: Insufficient documentation

## 2017-08-27 DIAGNOSIS — Z9981 Dependence on supplemental oxygen: Secondary | ICD-10-CM | POA: Insufficient documentation

## 2017-11-07 ENCOUNTER — Emergency Department (HOSPITAL_COMMUNITY)
Admission: EM | Admit: 2017-11-07 | Discharge: 2017-11-07 | Disposition: A | Discharge status: Home / Self Care | Payer: Medicare Other | Attending: Emergency Medicine | Admitting: Emergency Medicine

## 2017-11-07 ENCOUNTER — Encounter (HOSPITAL_COMMUNITY): Payer: Self-pay | Admitting: Emergency Medicine

## 2017-11-07 ENCOUNTER — Other Ambulatory Visit: Payer: Self-pay

## 2017-11-07 ENCOUNTER — Emergency Department (HOSPITAL_COMMUNITY): Payer: Medicare Other

## 2017-11-07 DIAGNOSIS — Z87891 Personal history of nicotine dependence: Secondary | ICD-10-CM | POA: Insufficient documentation

## 2017-11-07 DIAGNOSIS — Y9389 Activity, other specified: Secondary | ICD-10-CM | POA: Insufficient documentation

## 2017-11-07 DIAGNOSIS — Y998 Other external cause status: Secondary | ICD-10-CM | POA: Diagnosis not present

## 2017-11-07 DIAGNOSIS — D509 Iron deficiency anemia, unspecified: Secondary | ICD-10-CM | POA: Insufficient documentation

## 2017-11-07 DIAGNOSIS — S0083XA Contusion of other part of head, initial encounter: Secondary | ICD-10-CM | POA: Insufficient documentation

## 2017-11-07 DIAGNOSIS — Z79899 Other long term (current) drug therapy: Secondary | ICD-10-CM | POA: Diagnosis not present

## 2017-11-07 DIAGNOSIS — T148XXA Other injury of unspecified body region, initial encounter: Secondary | ICD-10-CM

## 2017-11-07 DIAGNOSIS — Z7982 Long term (current) use of aspirin: Secondary | ICD-10-CM | POA: Diagnosis not present

## 2017-11-07 DIAGNOSIS — E119 Type 2 diabetes mellitus without complications: Secondary | ICD-10-CM | POA: Insufficient documentation

## 2017-11-07 DIAGNOSIS — I10 Essential (primary) hypertension: Secondary | ICD-10-CM | POA: Insufficient documentation

## 2017-11-07 DIAGNOSIS — W19XXXA Unspecified fall, initial encounter: Secondary | ICD-10-CM

## 2017-11-07 DIAGNOSIS — E039 Hypothyroidism, unspecified: Secondary | ICD-10-CM | POA: Diagnosis not present

## 2017-11-07 DIAGNOSIS — S0990XA Unspecified injury of head, initial encounter: Secondary | ICD-10-CM | POA: Diagnosis present

## 2017-11-07 DIAGNOSIS — W01198A Fall on same level from slipping, tripping and stumbling with subsequent striking against other object, initial encounter: Secondary | ICD-10-CM | POA: Diagnosis not present

## 2017-11-07 DIAGNOSIS — Z7902 Long term (current) use of antithrombotics/antiplatelets: Secondary | ICD-10-CM | POA: Insufficient documentation

## 2017-11-07 DIAGNOSIS — Y929 Unspecified place or not applicable: Secondary | ICD-10-CM | POA: Diagnosis not present

## 2017-11-07 LAB — CBC
HCT: 29.5 % — ABNORMAL LOW (ref 36.0–46.0)
Hemoglobin: 8.8 g/dL — ABNORMAL LOW (ref 12.0–15.0)
MCH: 23.9 pg — ABNORMAL LOW (ref 26.0–34.0)
MCHC: 29.8 g/dL — ABNORMAL LOW (ref 30.0–36.0)
MCV: 80.2 fL (ref 78.0–100.0)
Platelets: 285 10*3/uL (ref 150–400)
RBC: 3.68 MIL/uL — ABNORMAL LOW (ref 3.87–5.11)
RDW: 23.9 % — ABNORMAL HIGH (ref 11.5–15.5)
WBC: 8.8 10*3/uL (ref 4.0–10.5)

## 2017-11-07 LAB — DIFFERENTIAL
Abs Immature Granulocytes: 0 10*3/uL (ref 0.0–0.1)
Basophils Absolute: 0.1 10*3/uL (ref 0.0–0.1)
Basophils Relative: 1 %
Eosinophils Absolute: 0.3 10*3/uL (ref 0.0–0.7)
Eosinophils Relative: 4 %
Immature Granulocytes: 0 %
Lymphocytes Relative: 50 %
Lymphs Abs: 4.4 10*3/uL — ABNORMAL HIGH (ref 0.7–4.0)
Monocytes Absolute: 0.5 10*3/uL (ref 0.1–1.0)
Monocytes Relative: 6 %
Neutro Abs: 3.5 10*3/uL (ref 1.7–7.7)
Neutrophils Relative %: 39 %

## 2017-11-07 LAB — I-STAT TROPONIN, ED: Troponin i, poc: 0 ng/mL (ref 0.00–0.08)

## 2017-11-07 LAB — COMPREHENSIVE METABOLIC PANEL
ALT: 13 U/L (ref 0–44)
AST: 18 U/L (ref 15–41)
Albumin: 3.6 g/dL (ref 3.5–5.0)
Alkaline Phosphatase: 76 U/L (ref 38–126)
Anion gap: 9 (ref 5–15)
BUN: 15 mg/dL (ref 8–23)
CO2: 22 mmol/L (ref 22–32)
Calcium: 8.8 mg/dL — ABNORMAL LOW (ref 8.9–10.3)
Chloride: 106 mmol/L (ref 98–111)
Creatinine, Ser: 0.81 mg/dL (ref 0.44–1.00)
GFR calc Af Amer: >60 mL/min (ref >60)
GFR calc non Af Amer: >60 mL/min (ref >60)
Glucose, Bld: 135 mg/dL — ABNORMAL HIGH (ref 70–99)
Potassium: 4.1 mmol/L (ref 3.5–5.1)
Sodium: 137 mmol/L (ref 135–145)
Total Bilirubin: 0.5 mg/dL (ref 0.3–1.2)
Total Protein: 5.9 g/dL — ABNORMAL LOW (ref 6.5–8.1)

## 2017-11-07 LAB — APTT: aPTT: 25 seconds (ref 24–36)

## 2017-11-07 LAB — PROTIME-INR
INR: 1.01
Prothrombin Time: 13.2 seconds (ref 11.4–15.2)

## 2017-11-07 LAB — POC OCCULT BLOOD, ED: Fecal Occult Bld: NEGATIVE

## 2017-11-07 MED ORDER — ACETAMINOPHEN 500 MG PO TABS
1000.0000 mg | ORAL_TABLET | Freq: Once | ORAL | Status: AC
  Administered 2017-11-07: 1000 mg via ORAL
  Filled 2017-11-07: qty 2

## 2017-11-07 MED ORDER — TETRACAINE HCL 0.5 % OP SOLN
1.0000 [drp] | Freq: Once | OPHTHALMIC | Status: AC
  Administered 2017-11-07: 1 [drp] via OPHTHALMIC
  Filled 2017-11-07: qty 4

## 2017-11-07 NOTE — ED Provider Notes (Signed)
[facility] EMERGENCY DEPARTMENT Provider Note   CSN: [id number] Arrival date & time: 11/07/17  1901     History   Chief Complaint Chief Complaint  Patient presents with  . Fall  . Head Injury  . Dizziness    HPI Desiree Leon is a 77 y.o. female past medical history of diabetes, hyperlipidemia, hypertension, MI who presents for evaluation of after mechanical fall that occurred approximately 8 AM this morning.  Patient reports that she was bending over to lift some water and put it away when she fell forward, hitting her head on the door.  She states she did not have any LOC and was able to get up immediately after the incident.  Patient reports that immediately after, she felt nauseous.  She states she had to sit down.  She states that she felt slightly dizzy immediately after the incident.  She denied any preceding chest pain or dizziness.  Patient states that she was trying to just waited out but states that she continued to have headache and felt a pressure behind her eyes all day.  She reports some associated blurry vision.  She states that she did not take any medication for the pain.  Patient reports that over the last 2 weeks, she has had intermittent episodes where she has felt "dizzy and off-balance, almost being unsteady."  She states that these episodes occur randomly and they are not all the time.  She states she made an appointment with her primary care doctor for evaluation of these episodes.  She states she did not have 1 of these episodes prior to today's fall.  Patient denies any chest pain, difficulty breathing, neck pain, back pain, numbness/weakness of her arms or legs, abdominal pain, pain.  The history is provided by the patient.    Past Medical History:  Diagnosis Date  . Anxiety   . Blood transfusion   . Diabetes mellitus    type II  . Heart murmur   . History of colon polyps   . Hyperlipidemia   . Hypertension   . MI (myocardial  infarction) ([place])   . Mitral valve prolapse   . Osteoarthritis   . Osteoporosis   . Spinal stenosis   . Thyroid disease    hypothyroidism  . Venous insufficiency     Patient Active Problem List   Diagnosis Date Noted  . Unstable angina ([place]) 05/08/2014  . Esophageal reflux   . Chest pain 04/18/2014  . Mitral valve prolapse 04/18/2014  . Hypothyroid 04/18/2014  . Diabetes mellitus, type II ([place]) 04/18/2014  . Dyslipidemia 04/18/2014  . NSTEMI (non-ST elevated myocardial infarction) ([place])   . CAP (community acquired pneumonia)   . Pain in the chest     Past Surgical History:  Procedure Laterality Date  . ABDOMINAL HYSTERECTOMY  11/26/1976  . APPENDECTOMY  02/14/1978  . BASAL CELL CARCINOMA EXCISION  01/26/1990  . COLONOSCOPY  1993, 2003, 2008, 2012  . DILATION AND CURETTAGE OF UTERUS  09/1976  . LEFT HEART CATHETERIZATION WITH CORONARY ANGIOGRAM N/A 04/18/2014   Procedure: LEFT HEART CATHETERIZATION WITH CORONARY ANGIOGRAM;  Surgeon: Peter M Martinique, MD;  Location: [facility] CATH LAB;  Service: Cardiovascular;  Laterality: N/A;  . MASS EXCISION  10/20/2011   Procedure: MINOR EXCISION OF MASS;  Surgeon: Adin Hector, MD;  Location: [facility];  Service: General;  Laterality: N/A;  Excision mass posterior neck  . OOPHORECTOMY  02/14/1978  . SHOULDER SURGERY  10/18/2007  basal cell  . SQUAMOUS CELL CARCINOMA EXCISION    . TONSILLECTOMY  1955  . TUBAL LIGATION  07/08/74     OB History   None      Home Medications    Prior to Admission medications   Medication Sig Start Date End Date Taking? Authorizing Provider  acetaminophen (TYLENOL) 500 MG tablet Take 500 mg by mouth every 6 (six) hours as needed for fever or headache.     [provider]  ALPRAZolam Duanne Moron) 0.25 MG tablet Take 0.125 mg by mouth at bedtime as needed for anxiety.     [provider]  aspirin 81 MG chewable tablet Chew 81 mg by mouth daily.    [provider]    clopidogrel (PLAVIX) 75 MG tablet Take 1 tablet (75 mg total) by mouth daily with breakfast. 04/21/14   Barton Dubois, MD  escitalopram (LEXAPRO) 10 MG tablet Take 5 mg by mouth daily.    [provider]  levothyroxine (SYNTHROID, LEVOTHROID) 150 MCG tablet Take 150 mcg by mouth daily before breakfast.  08/25/11   [provider]  metoprolol tartrate (LOPRESSOR) 25 MG tablet Take 25 mg by mouth 2 (two) times daily.  09/12/11   [provider]  niCARdipine (CARDENE) 20 MG capsule Take 20 mg by mouth every evening.  09/12/11   [provider]  nitroGLYCERIN (NITRODUR - DOSED IN MG/24 HR) 0.4 mg/hr patch Place 0.4 mg onto the skin daily.    [provider]  nitroGLYCERIN (NITROSTAT) 0.4 MG SL tablet Place 1 tablet (0.4 mg total) under the tongue every 5 (five) minutes as needed for chest pain. 04/21/14   Barton Dubois, MD  pantoprazole (PROTONIX) 40 MG tablet Take 1 tablet (40 mg total) by mouth daily at 12 noon. 04/21/14   Barton Dubois, MD  ranolazine (RANEXA) 1000 MG SR tablet Take 1 tablet (1,000 mg total) by mouth 2 (two) times daily. Patient taking differently: Take 500 mg by mouth 2 (two) times daily.  05/10/14   Adrian Prows, MD  VYTORIN 10-20 MG per tablet Take 1 tablet by mouth daily at 6 PM.  09/18/11   [provider]    Family History Family History  Problem Relation Age of Onset  . Heart disease Father   . Cancer Sister        breast  . Cancer Daughter        cervical and bone    Social History Social History   Tobacco Use  . Smoking status: Former Smoker    Last attempt to quit: 03/10/2005    Years since quitting: 12.6  . Smokeless tobacco: Never Used  Substance Use Topics  . Alcohol use: No  . Drug use: No     Allergies   Other and Dilaudid [hydromorphone hcl]   Review of Systems Review of Systems  Constitutional: Negative for fever.  HENT: Negative for congestion.   Eyes: Positive for visual disturbance.   Respiratory: Negative for cough and shortness of breath.   Cardiovascular: Negative for chest pain.  Gastrointestinal: Negative for abdominal pain, diarrhea, nausea and vomiting.  Genitourinary: Negative for dysuria and hematuria.  Musculoskeletal: Negative for back pain and neck pain.  Skin: Negative for rash.  Neurological: Positive for dizziness (intermitent) and headaches. Negative for weakness and numbness.  Psychiatric/Behavioral: Negative for confusion.  All other systems reviewed and are negative.    Physical Exam Updated Vital Signs BP (!) 136/54   Pulse (!) 59   Temp 98.7  F (37.1 C) (Oral)   Resp 19   SpO2 97%   Physical Exam  Constitutional: She is oriented to person, place, and time. She appears well-developed and well-nourished.  HENT:  Head: Normocephalic and atraumatic.    Mouth/Throat: Oropharynx is clear and moist and mucous membranes are normal.  Eyes: Pupils are equal, round, and reactive to light. Conjunctivae, EOM and lids are normal.  EOMs intact without any difficulty.  No tenderness palpation noted to periorbital bilaterally.  Neck: Full passive range of motion without pain.  Full flexion/extension and lateral movement of neck fully intact. No bony midline tenderness. No deformities or crepitus.   Cardiovascular: Normal rate, regular rhythm, normal heart sounds and normal pulses. Exam reveals no gallop and no friction rub.  No murmur heard. Pulses:      Radial pulses are 2+ on the right side, and 2+ on the left side.       Dorsalis pedis pulses are 2+ on the right side, and 2+ on the left side.  Pulmonary/Chest: Effort normal and breath sounds normal.  Lungs clear to auscultation bilaterally.  Symmetric chest rise.  No wheezing, rales, rhonchi.  Abdominal: Soft. Normal appearance. There is no tenderness. There is no rigidity and no guarding.  Abdomen is soft, non-distended, non-tender. No rigidity, No guarding. No peritoneal signs.  Genitourinary:  Rectum normal. Rectal exam shows guaiac negative stool.  Genitourinary Comments: The exam was performed with a chaperone present.  DRE revealed no gross melena.  Musculoskeletal: Normal range of motion.  Neurological: She is alert and oriented to person, place, and time.  Cranial nerves III-XII intact Follows commands, Moves all extremities  5/5 strength to BUE and BLE  Sensation intact throughout all major nerve distributions Normal finger to nose. No slurred speech. No facial droop.   Skin: Skin is warm and dry. Capillary refill takes less than 2 seconds.  Psychiatric: She has a normal mood and affect. Her speech is normal.  Nursing note and vitals reviewed.    ED Treatments / Results  Labs (all labs ordered are listed, but only abnormal results are displayed) Labs Reviewed  CBC - Abnormal; Notable for the following components:      Result Value   RBC 3.68 (*)    Hemoglobin 8.8 (*)    HCT 29.5 (*)    MCH 23.9 (*)    MCHC 29.8 (*)    RDW 23.9 (*)    All other components within normal limits  DIFFERENTIAL - Abnormal; Notable for the following components:   Lymphs Abs 4.4 (*)    All other components within normal limits  COMPREHENSIVE METABOLIC PANEL - Abnormal; Notable for the following components:   Glucose, Bld 135 (*)    Calcium 8.8 (*)    Total Protein 5.9 (*)    All other components within normal limits  PROTIME-INR  APTT  I-STAT TROPONIN, ED  POC OCCULT BLOOD, ED    EKG EKG Interpretation  Date/Time:  Saturday November 07 2017 19:18:04 EDT Ventricular Rate:  61 PR Interval:  182 QRS Duration: 94 QT Interval:  426 QTC Calculation: 428 R Axis:   72 Text Interpretation:  Normal sinus rhythm Normal ECG Confirmed by Lennice Sites [phone]) on 11/07/2017 10:31:20 PM   Radiology Ct Head Wo Contrast  Result Date: 11/07/2017 CLINICAL DATA:  Dizziness and gait imbalance for 2 weeks. Fell this morning, RIGHT forehead hematoma. History of hypertension, hyperlipidemia,  diabetes. EXAM: CT HEAD WITHOUT CONTRAST TECHNIQUE: Contiguous axial images were obtained from  the base of the skull through the vertex without intravenous contrast. COMPARISON:  MRI of the head June 16, 2014 FINDINGS: BRAIN: No intraparenchymal hemorrhage, mass effect nor midline shift. The ventricles and sulci are normal for age. Faint supratentorial white matter hypodensities less than expected for patient's age, though non-specific are most compatible with chronic small vessel ischemic disease. No acute large vascular territory infarcts. No abnormal extra-axial fluid collections. Basal cisterns are patent. VASCULAR: Mild-to-moderate calcific atherosclerosis of the carotid siphons. SKULL: No skull fracture. Small RIGHT frontal scalp hematoma. No subcutaneous gas or radiopaque foreign bodies. SINUSES/ORBITS: Scattered mucosal retention cyst without paranasal sinus air-fluid levels. Mastoid air cells are well aerated.The included ocular globes and orbital contents are non-suspicious. Status post bilateral ocular lens implants. OTHER: None. IMPRESSION: 1. Normal noncontrast CT HEAD. 2. Small RIGHT frontal scalp hematoma. Electronically Signed   By: Elon Alas M.D.   On: 11/07/2017 22:09    Procedures Procedures (including critical care time)  Medications Ordered in ED Medications  tetracaine (PONTOCAINE) 0.5 % ophthalmic solution 1 drop (1 drop Both Eyes Given by Other 11/07/17 2244)  acetaminophen (TYLENOL) tablet 1,000 mg (1,000 mg Oral Given 11/07/17 2244)     Initial Impression / Assessment and Plan / ED Course  I have reviewed the triage vital signs and the nursing notes.  Pertinent labs & imaging results that were available during my care of the patient were reviewed by me and considered in my medical decision making (see chart for details).     77 year old female who presents for evaluation after a fall and head injury that occurred approximately 8 AM this morning.  Reports she was  bending over and states that she fell.  No preceding chest pain or dizziness but unclear why she fell.  Since then has been having headache, pain in the right side of her head and some blurry vision.  Is on Plavix and aspirin.  No neck or back pain, numbness/weakness of arms or legs. Patient is afebrile, non-toxic appearing, sitting comfortably on examination table. Vital signs reviewed and stable. No neuro deficits noted on exam.  On exam, patient does have hematoma noted to right forehead and right temporal area.  EOMs are intact.  No signs of entrapment.  Initial labs and imaging ordered at triage.  I-STAT troponin negative.  INR is 1.01.  CBC no leukocytosis.  Hemoglobin is 8.8 and hematocrit is 9.5.  Most recent CBC in our system is from March 2016 which showed a hemoglobin of 11.8 and hematocrit of 35.5. CMP unremarkable.   Intraocular pressure as documented below:  Left IOP: 11 Right IOP: 13  Discussed results with patient.  She reports a history of anemia.  She is on oral iron supplementation.  She states that her doctor was talking about having her switch to iron infusions but she has not done that yet.  Digital rectal exam showed no evidence of gross melena.  Fecal occult was negative.  CT head negative for any acute skull fracture or intracranial hemorrhage.  Discussed results with patient and family.  Patient currently denying any symptoms at this time.  She was able to ambulate in the hall without any difficulty and no dizziness.  Patient stable for discharge at this time.  Instructed patient to follow-up with her primary care doctor regarding her anemia.  I suspect this may play a part in her intermittent dizziness.  At this time, patient exhibits no difficulty ambulating no neuro deficits.  No indication for further MR  imaging.  Do not suspect acute CVA as the cause of patient's symptoms. Patient had ample opportunity for questions and discussion. All patient's questions were answered with  full understanding. Strict return precautions discussed. Patient expresses understanding and agreement to plan.   Final Clinical Impressions(s) / ED Diagnoses   Final diagnoses:  Fall, initial encounter  Hematoma  Iron deficiency anemia, unspecified iron deficiency anemia type    ED Discharge Orders    None       Desma Mcgregor 11/08/17 0211    Lennice Sites, DO 11/08/17 1113

## 2017-11-07 NOTE — ED Triage Notes (Signed)
Pt reports intermittent dizziness and loss of balance over the past 2 weeks.  Pt fell around 8:30am this morning and has hematoma to R forehead and pressure behind R eye.  States she doesn't know why she fell.  Doesn't remember feeling dizzy, slipping, or tripping.  C/o dizziness at present.  No arm drift or leg drift.  Denies weakness.  Speech clear. Denies LOC.  Denies neck and back pain.

## 2017-11-07 NOTE — ED Notes (Signed)
Pt ambulated from the bed to the hallway independently.

## 2017-11-07 NOTE — Discharge Instructions (Signed)
Apply ice to the forehead to help with pain and swelling.  As we discussed, your levels today were low.  Your hemoglobin was 0.8.  Continue taking your iron supplements.  As we discussed, you need to contact your primary care doctor to follow-up for this anemia.  Return to the emergency department for any difficulty walking, numbness/weakness of your arms or legs, chest pain, difficulty breathing, vision changes or any other worsening concerning symptoms.

## 2017-11-10 DIAGNOSIS — Z9181 History of falling: Secondary | ICD-10-CM | POA: Insufficient documentation

## 2017-11-10 DIAGNOSIS — R42 Dizziness and giddiness: Secondary | ICD-10-CM | POA: Insufficient documentation

## 2017-11-18 ENCOUNTER — Other Ambulatory Visit (HOSPITAL_COMMUNITY): Payer: Self-pay | Admitting: *Deleted

## 2017-11-19 ENCOUNTER — Ambulatory Visit (HOSPITAL_COMMUNITY)
Admission: RE | Admit: 2017-11-19 | Discharge: 2017-11-19 | Disposition: A | Discharge status: Home / Self Care | Payer: Medicare Other | Source: Ambulatory Visit | Attending: Internal Medicine | Admitting: Internal Medicine

## 2017-11-19 DIAGNOSIS — D509 Iron deficiency anemia, unspecified: Secondary | ICD-10-CM | POA: Insufficient documentation

## 2017-11-19 MED ORDER — SODIUM CHLORIDE 0.9 % IV SOLN
510.0000 mg | Freq: Once | INTRAVENOUS | Status: AC
  Administered 2017-11-19: 510 mg via INTRAVENOUS
  Filled 2017-11-19: qty 17

## 2018-03-10 DIAGNOSIS — I2699 Other pulmonary embolism without acute cor pulmonale: Secondary | ICD-10-CM

## 2018-03-10 HISTORY — DX: Other pulmonary embolism without acute cor pulmonale: I26.99

## 2018-03-12 ENCOUNTER — Encounter (HOSPITAL_COMMUNITY): Payer: Self-pay | Admitting: Emergency Medicine

## 2018-03-12 ENCOUNTER — Emergency Department (HOSPITAL_COMMUNITY): Payer: Medicare Other

## 2018-03-12 ENCOUNTER — Emergency Department (HOSPITAL_COMMUNITY)
Admission: EM | Admit: 2018-03-12 | Discharge: 2018-03-13 | Disposition: A | Discharge status: Home / Self Care | Payer: Medicare Other | Attending: Emergency Medicine | Admitting: Emergency Medicine

## 2018-03-12 ENCOUNTER — Other Ambulatory Visit: Payer: Self-pay

## 2018-03-12 DIAGNOSIS — E119 Type 2 diabetes mellitus without complications: Secondary | ICD-10-CM | POA: Diagnosis not present

## 2018-03-12 DIAGNOSIS — Z7982 Long term (current) use of aspirin: Secondary | ICD-10-CM | POA: Insufficient documentation

## 2018-03-12 DIAGNOSIS — M25531 Pain in right wrist: Secondary | ICD-10-CM | POA: Diagnosis not present

## 2018-03-12 DIAGNOSIS — Z85828 Personal history of other malignant neoplasm of skin: Secondary | ICD-10-CM | POA: Diagnosis not present

## 2018-03-12 DIAGNOSIS — Z955 Presence of coronary angioplasty implant and graft: Secondary | ICD-10-CM | POA: Diagnosis not present

## 2018-03-12 DIAGNOSIS — M25431 Effusion, right wrist: Secondary | ICD-10-CM

## 2018-03-12 DIAGNOSIS — I252 Old myocardial infarction: Secondary | ICD-10-CM | POA: Insufficient documentation

## 2018-03-12 DIAGNOSIS — E039 Hypothyroidism, unspecified: Secondary | ICD-10-CM | POA: Diagnosis not present

## 2018-03-12 DIAGNOSIS — W07XXXA Fall from chair, initial encounter: Secondary | ICD-10-CM

## 2018-03-12 DIAGNOSIS — Z87891 Personal history of nicotine dependence: Secondary | ICD-10-CM | POA: Diagnosis not present

## 2018-03-12 DIAGNOSIS — Z7901 Long term (current) use of anticoagulants: Secondary | ICD-10-CM | POA: Insufficient documentation

## 2018-03-12 DIAGNOSIS — I10 Essential (primary) hypertension: Secondary | ICD-10-CM | POA: Insufficient documentation

## 2018-03-12 DIAGNOSIS — M25421 Effusion, right elbow: Secondary | ICD-10-CM | POA: Insufficient documentation

## 2018-03-12 DIAGNOSIS — Z79899 Other long term (current) drug therapy: Secondary | ICD-10-CM | POA: Diagnosis not present

## 2018-03-12 NOTE — ED Notes (Signed)
Ice applied to R wrist.  

## 2018-03-12 NOTE — ED Triage Notes (Signed)
Pt went to stand up from a stool around 7:30 pm and the stool slid out from under her.  C/o pain to R wrist, R shoulder, R arm, and thoracic back pain.  Denies LOC.

## 2018-03-12 NOTE — ED Notes (Signed)
Spoke with [facility] PA regarding thoracic back pain.  Desiree Leon, [place] states to order Chest x-ray.

## 2018-03-13 ENCOUNTER — Emergency Department (HOSPITAL_COMMUNITY): Payer: Medicare Other

## 2018-03-13 MED ORDER — ACETAMINOPHEN 325 MG PO TABS
650.0000 mg | ORAL_TABLET | Freq: Once | ORAL | Status: DC
  Filled 2018-03-13: qty 2

## 2018-03-13 NOTE — ED Provider Notes (Signed)
[facility] EMERGENCY DEPARTMENT Provider Note   CSN: [id number] Arrival date & time: 03/12/18  1952     History   Chief Complaint Chief Complaint  Patient presents with  . Fall  . Wrist Pain  . Desiree Leon Pain    HPI Desiree Leon is a 78 y.o. female.  The history is provided by the patient.  Fall   Wrist Pain   Desiree Leon Pain   She has history of diabetes, hyperlipidemia, hypertension, osteoporosis, myocardial infarction and comes in following a fall at home.  She lost her balance getting off of a stool and injured her right wrist, elbow, shoulder.  She denies head injury or loss of consciousness.  She is not on any anticoagulants.  Past Medical History:  Diagnosis Date  . Anxiety   . Blood transfusion   . Diabetes mellitus    type II  . Heart murmur   . History of colon polyps   . Hyperlipidemia   . Hypertension   . MI (myocardial infarction) ([place])   . Mitral valve prolapse   . Osteoarthritis   . Osteoporosis   . Spinal stenosis   . Thyroid disease    hypothyroidism  . Venous insufficiency     Patient Active Problem List   Diagnosis Date Noted  . Unstable angina ([place]) 05/08/2014  . Esophageal reflux   . Chest pain 04/18/2014  . Mitral valve prolapse 04/18/2014  . Hypothyroid 04/18/2014  . Diabetes mellitus, type II ([place]) 04/18/2014  . Dyslipidemia 04/18/2014  . NSTEMI (non-ST elevated myocardial infarction) ([place])   . CAP (community acquired pneumonia)   . Pain in the chest     Past Surgical History:  Procedure Laterality Date  . ABDOMINAL HYSTERECTOMY  11/26/1976  . APPENDECTOMY  02/14/1978  . BASAL CELL CARCINOMA EXCISION  01/26/1990  . COLONOSCOPY  1993, 2003, 2008, 2012  . DILATION AND CURETTAGE OF UTERUS  09/1976  . LEFT HEART CATHETERIZATION WITH CORONARY ANGIOGRAM N/A 04/18/2014   Procedure: LEFT HEART CATHETERIZATION WITH CORONARY ANGIOGRAM;  Surgeon: Peter M Martinique, MD;  Location: [facility] CATH LAB;  Service: Cardiovascular;  Laterality:  N/A;  . MASS EXCISION  10/20/2011   Procedure: MINOR EXCISION OF MASS;  Surgeon: Adin Hector, MD;  Location: [facility];  Service: General;  Laterality: N/A;  Excision mass posterior neck  . OOPHORECTOMY  02/14/1978  . SHOULDER SURGERY  10/18/2007   basal cell  . SQUAMOUS CELL CARCINOMA EXCISION    . TONSILLECTOMY  1955  . TUBAL LIGATION  07/08/74     OB History   No obstetric history on file.      Home Medications    Prior to Admission medications   Medication Sig Start Date End Date Taking? Authorizing Provider  acetaminophen (TYLENOL) 500 MG tablet Take 500 mg by mouth every 6 (six) hours as needed for fever or headache.     [provider]  ALPRAZolam Duanne Moron) 0.25 MG tablet Take 0.125 mg by mouth at bedtime as needed for anxiety.     [provider]  aspirin 81 MG chewable tablet Chew 81 mg by mouth daily.    [provider]  clopidogrel (PLAVIX) 75 MG tablet Take 1 tablet (75 mg total) by mouth daily with breakfast. 04/21/14   Barton Dubois, MD  escitalopram (LEXAPRO) 10 MG tablet Take 5 mg by mouth daily.    [provider]  ferrous fumarate-iron polysaccharide complex (TANDEM) 162-115.2 MG CAPS capsule Take 2 capsules  by mouth daily with breakfast.    [provider]  levothyroxine (SYNTHROID, LEVOTHROID) 150 MCG tablet Take 150 mcg by mouth daily before breakfast.  08/25/11   [provider]  metoprolol tartrate (LOPRESSOR) 25 MG tablet Take 25 mg by mouth 2 (two) times daily.  09/12/11   [provider]  niCARdipine (CARDENE) 20 MG capsule Take 20 mg by mouth every evening.  09/12/11   [provider]  nitroGLYCERIN (NITRODUR - DOSED IN MG/24 HR) 0.4 mg/hr patch Place 0.4 mg onto the skin daily.    [provider]  nitroGLYCERIN (NITROSTAT) 0.4 MG SL tablet Place 1 tablet (0.4 mg total) under the tongue every 5 (five) minutes as needed for chest pain. 04/21/14   Barton Dubois, MD    pantoprazole (PROTONIX) 40 MG tablet Take 1 tablet (40 mg total) by mouth daily at 12 noon. 04/21/14   Barton Dubois, MD  ranolazine (RANEXA) 1000 MG SR tablet Take 1 tablet (1,000 mg total) by mouth 2 (two) times daily. Patient taking differently: Take 500 mg by mouth 2 (two) times daily.  05/10/14   Adrian Prows, MD  VYTORIN 10-20 MG per tablet Take 1 tablet by mouth daily at 6 PM.  09/18/11   [provider]    Family History Family History  Problem Relation Age of Onset  . Heart disease Father   . Cancer Sister        breast  . Cancer Daughter        cervical and bone    Social History Social History   Tobacco Use  . Smoking status: Former Smoker    Last attempt to quit: 03/10/2005    Years since quitting: 13.0  . Smokeless tobacco: Never Used  Substance Use Topics  . Alcohol use: No  . Drug use: No     Allergies   Other and Dilaudid [hydromorphone hcl]   Review of Systems Review of Systems  All other systems reviewed and are negative.    Physical Exam Updated Vital Signs BP 129/60 (BP Location: Left Desiree Leon)   Pulse (!) 55   Temp 98 F (36.7 C) (Oral)   Resp 14   Ht 5\' 4"  (1.626 m)   Wt 79.4 kg   SpO2 98%   BMI 30.04 kg/m   Physical Exam Vitals signs and nursing note reviewed.    78 year old female, resting comfortably and in no acute distress. Vital signs are significant for slow heart rate. Oxygen saturation is 98%, which is normal. Head is normocephalic and atraumatic. PERRLA, EOMI. Oropharynx is clear. Neck is nontender and supple without adenopathy or JVD. Back is nontender and there is no CVA tenderness. Lungs are clear without rales, wheezes, or rhonchi. Chest is nontender. Heart has regular rate and rhythm without murmur. Abdomen is soft, flat, nontender without masses or hepatosplenomegaly and peristalsis is normoactive. Extremities: Mild tenderness and soft tissue of the right shoulder without point tenderness, full range of motion  present.  Moderate tenderness rather diffusely through the right elbow and pain with passive range of motion in any direction, but no swelling seen.  Mild swelling of the distal right forearm just proximal to the wrist.  There is moderate tenderness to palpation over the anatomic snuffbox, but no pain elicited with axial loading of the thumb.  Pain with passive flexion extension of the right wrist.  Distal neurovascular exam is intact.  Remainder of extremity exam is normal. Skin is warm and dry without rash. Neurologic:  Mental status is normal, cranial nerves are intact, there are no motor or sensory deficits.  ED Treatments / Results   Radiology Dg Chest 2 View  Result Date: 03/12/2018 CLINICAL DATA:  Pain after fall today. EXAM: CHEST - 2 VIEW COMPARISON:  Radiograph 05/08/2014 FINDINGS: The cardiomediastinal contours are normal. Subsegmental atelectasis or scarring at the left lung base. Pulmonary vasculature is normal. No consolidation, pleural effusion, or pneumothorax. No acute osseous abnormalities are seen. IMPRESSION: No acute findings or evidence of acute traumatic injury. Electronically Signed   By: Keith Rake M.D.   On: 03/12/2018 22:48   Dg Shoulder Right  Result Date: 03/12/2018 CLINICAL DATA:  Right shoulder pain after fall today. EXAM: RIGHT SHOULDER - 2+ VIEW COMPARISON:  None. FINDINGS: There is no evidence of fracture or dislocation. There is no evidence of arthropathy or other focal bone abnormality. Remote right fifth rib fracture. Soft tissues are unremarkable. IMPRESSION: Negative radiographs of the right shoulder. Electronically Signed   By: Keith Rake M.D.   On: 03/12/2018 22:46   Dg Elbow Complete Right  Result Date: 03/13/2018 CLINICAL DATA:  Status post fall, landing on right Desiree Leon. Posterior right elbow pain. Initial encounter. EXAM: RIGHT ELBOW - COMPLETE 3+ VIEW COMPARISON:  None. FINDINGS: There appears to be an elbow joint effusion, which could reflect  underlying occult radial head fracture. The distal humerus and olecranon are unremarkable in appearance. No additional soft tissue abnormalities are seen. IMPRESSION: Apparent elbow joint effusion, which could reflect underlying occult radial head fracture. Electronically Signed   By: Garald Balding M.D.   On: 03/13/2018 06:53   Dg Wrist Complete Right  Result Date: 03/12/2018 CLINICAL DATA:  Right wrist pain after fall today. EXAM: RIGHT WRIST - COMPLETE 3+ VIEW COMPARISON:  None. FINDINGS: There is no evidence of fracture or dislocation. The alignment is maintained. Minimal cystic changes in the carpal bones appear degenerative. Soft tissues are unremarkable. IMPRESSION: No fracture or dislocation of the right wrist. Electronically Signed   By: Keith Rake M.D.   On: 03/12/2018 22:47    Procedures .Splint Application Date/Time: 06/10/5954 7:35 AM Performed by: Delora Fuel, MD Authorized by: Delora Fuel, MD   Consent:    Consent obtained:  Verbal   Consent given by:  Patient   Risks discussed:  Numbness, pain and swelling   Alternatives discussed:  Alternative treatment Pre-procedure details:    Sensation:  Normal   Skin color:  Normal Procedure details:    Laterality:  Right   Location:  Desiree Leon   Desiree Leon:  R lower Desiree Leon and R upper Desiree Leon   Strapping: no     Splint type:  Long Desiree Leon   Supplies:  Elastic bandage, sling, cotton padding and Ortho-Glass Post-procedure details:    Pain:  Improved   Sensation:  Normal   Skin color:  Normal   Patient tolerance of procedure:  Tolerated well, no immediate complications Comments:     Splint applied by orthopedic technician, neurovascular status evaluated by me following splint application.   Medications Ordered in ED Medications  acetaminophen (TYLENOL) tablet 650 mg (has no administration in time range)     Initial Impression / Assessment and Plan / ED Course  I have reviewed the triage vital signs and the nursing notes.  Pertinent  imaging results that were available during my care of the patient were reviewed by me and considered in my medical decision making (see chart for details).  Fall with injury to right Desiree Leon.  X-rays  of right shoulder and wrist were ordered at triage and are negative for fracture.  Chest x-ray is also negative for fracture.  However, I am concerned about possible occult scaphoid fracture and she is placed in a thumb spica splint.  She is sent back for x-rays of her elbow.  Elbow x-ray shows evidence of an effusion, which is worrisome for occult radial head fracture.  She is placed in a long-Desiree Leon splint and referred to orthopedics for follow-up.  She is referred to hand surgery for follow-up of her possible occult scaphoid fracture.  Final Clinical Impressions(s) / ED Diagnoses   Final diagnoses:  Fall from chair, initial encounter  Pain and swelling of right wrist  Elbow joint effusion, right    ED Discharge Orders    None       Delora Fuel, MD 88/75/79 [phone]

## 2018-03-13 NOTE — ED Notes (Signed)
EDP at bedside  

## 2018-03-13 NOTE — ED Notes (Signed)
Discharge instructions reviewed with patient and family. Ortho tech at bedside applying splint.

## 2018-03-13 NOTE — ED Notes (Signed)
Family updated with pt wait time and current position on list. Family also thanked for their patience this evening.

## 2018-03-13 NOTE — Progress Notes (Signed)
Orthopedic Tech Progress Note Patient Details:  Desiree Leon [birth year] [id number]  Ortho Devices Type of Ortho Device: Arm sling, Post (long arm) splint Ortho Device/Splint Location: rue Ortho Device/Splint Interventions: Ordered, Application, Adjustment   Post Interventions Patient Tolerated: Well Instructions Provided: Care of device, Adjustment of device   Karolee Stamps 03/13/2018, 8:16 AM

## 2018-03-13 NOTE — Discharge Instructions (Addendum)
Your x-rays did not show any definite fractures, but I am concerned there may still be a fracture in your wrist.  The x-rays do show swelling of the elbow which could also represent a fracture.  Information about the fractures I am concerned about is included in the discharge instructions.  Please apply ice several times a day, and keep the arm elevated as much as possible.  Take acetaminophen and/or ibuprofen as needed for pain.  Follow-up with the orthopedic physician.

## 2018-03-16 ENCOUNTER — Encounter (INDEPENDENT_AMBULATORY_CARE_PROVIDER_SITE_OTHER): Payer: Self-pay | Admitting: Family Medicine

## 2018-03-16 ENCOUNTER — Ambulatory Visit (INDEPENDENT_AMBULATORY_CARE_PROVIDER_SITE_OTHER): Payer: Medicare Other | Admitting: Family Medicine

## 2018-03-16 DIAGNOSIS — M25521 Pain in right elbow: Secondary | ICD-10-CM

## 2018-03-16 NOTE — Progress Notes (Signed)
I saw and examined the patient with Dr. Okey Dupre and agree with assessment and plan as outlined.  Probable nondisplaced radial head and distal radius fractures.  SAC, sling, recheck 2 weeks.

## 2018-03-16 NOTE — Progress Notes (Signed)
  Desiree Leon - 78 y.o. female MRN [id number]  Date of birth: [birth year]    SUBJECTIVE:      Chief Complaint:/ HPI:  Desiree Leon is a pleasant 78 year old female who presents with right elbow and wrist pain.  5 days ago on 03/12/2018 she had a fall.  Patient states she was stepping down from a bar type stool in her kitchen and fell backwards.  She twisted and attempt to catch herself and fell on an outstretched right hand.  She was evaluated emergency department and x-rays were performed.  She was discharged in a posterior splint with follow-up.  Today she reports pain localized to the lateral right elbow and radial aspect of the wrist.  She notes some swelling in the hand and wrist.  Slight bruising.  Pain is sharp to touch but otherwise somewhat throbbing.  She did also notes very mild tingling sensation in all fingers which has been improving.  She is using Tylenol which helps sufficiently for pain.  Pain is worse with movement.   ROS:     See HPI  PERTINENT  PMH / PSH FH / / SH:  Past Medical, Surgical, Social, and Family History Reviewed & Updated in the EMR.    OBJECTIVE: There were no vitals taken for this visit.  Physical Exam:  Vital signs are reviewed.  GEN: Alert and oriented, NAD Pulm: Breathing unlabored PSY: normal mood, congruent affect  MSK: Right wrist: Swelling over the radial aspect of the wrist and into the fingers.  No localized erythema.  No significant bruising. Tenderness palpation over the distal radius.  Very minimal tenderness over the scaphoid Full range of motion of the fingers, patient not tolerant of wrist motion due to pain N/V intact distally  Right elbow: Mild diffuse swelling of the elbow.  No erythema or bruising Tenderness over the radial head N/V intact distally  MSK [place]: Right wrist: Limited ultrasound of the right wrist shows a nondisplaced fracture of the distal radius.  This corresponds with patient's area of maximal tenderness at the wrist.   There are subtle degenerative changes seen with the scaphoid, however there are no acute cortical irregularities or swelling seen at the scaphoid Right elbow: Limited ultrasound of the lateral right elbow shows a small nondisplaced intra-articular radial head fracture with surrounding swelling.   ASSESSMENT & PLAN:  1.  Right wrist pain- secondary to nondisplace distal radius fracture.  Previous x-rays from emergency department were independently reviewed today.  There appears to be a nondisplaced, nonangulated distal radius.  Fracture on x-rays, this corresponds with ultrasound.  There was some concern for involvement of the scaphoid by the ED physician, based on x-ray and ultrasound, low concern at this time. -Continue Tylenol for pain control -Short arm cast -Follow-up 2 weeks for repeat x-rays  2.  Right elbow pain - secondary to nondisplaced radial head fracture. x-rays from the ED reviewed today.  Concern for occult radial head fracture.  There is evidence of a nondisplaced radial head fracture with surrounding swelling on ultrasound today in the clinic. - Patient in sling and nonweightbearing until follow-up in 2 weeks.  Patient may come out of sling periodically for extension of elbow.

## 2018-03-30 ENCOUNTER — Ambulatory Visit (INDEPENDENT_AMBULATORY_CARE_PROVIDER_SITE_OTHER): Payer: Self-pay

## 2018-03-30 ENCOUNTER — Encounter (INDEPENDENT_AMBULATORY_CARE_PROVIDER_SITE_OTHER): Payer: Self-pay | Admitting: Family Medicine

## 2018-03-30 ENCOUNTER — Ambulatory Visit (INDEPENDENT_AMBULATORY_CARE_PROVIDER_SITE_OTHER): Payer: Medicare Other | Admitting: Family Medicine

## 2018-03-30 DIAGNOSIS — M25521 Pain in right elbow: Secondary | ICD-10-CM | POA: Diagnosis not present

## 2018-03-30 DIAGNOSIS — M25531 Pain in right wrist: Secondary | ICD-10-CM

## 2018-03-30 MED ORDER — DICLOFENAC SODIUM 1 % TD GEL: 4.0000 g | Freq: Four times a day (QID) | TRANSDERMAL | 6 refills | Status: DC | PRN

## 2018-03-30 NOTE — Progress Notes (Signed)
Office Visit Note   Patient: Desiree Leon           Date of Birth: 1940/06/02           MRN: [id number] Visit Date: 03/30/2018 Requested by: Burnard Bunting, MD [address] [place], [place] [address] PCP: Burnard Bunting, MD  Subjective: Chief Complaint  Patient presents with  . Right Elbow - Follow-up    In sling - "feeling better"  . Right Wrist - Follow-up    In SAC x 2 weeks - removing today    HPI: She is about 18 days status post fall from a chair resulting in right elbow and wrist pain with possible nondisplaced radial head fracture and distal radius fracture.  She is feeling better than before but still having pain especially in the thumb when she tries to move it.  She also has some soreness still in her elbow.              ROS: Noncontributory  Objective: Vital Signs: There were no vitals taken for this visit.  Physical Exam:  Right arm: Still tender to palpation at the radial head.  Good range of motion of the elbow with full extension and almost full flexion.  Good movement with pronation and supination of the forearm. Right wrist: Still very tender to palpation of the dorsal aspect of the distal radius.  She is also tender near the thumb CMC joint.  Imaging: X-rays right elbow: Nondisplaced radial head fracture, clinically healing.  X-rays right wrist: Callus formation present at extra-articular distal radius fracture with no angulation.  Substantial carpal DJD at the base of the thumb.    Assessment & Plan: 1.  Clinically healing 3 weeks status post fall with right radial head and distal radius fracture as well as aggravation of thumb CMC arthrosis -Removable thumb spica splint, work on elbow and hand/wrist range of motion to tolerance.  Follow-up in 3 weeks as needed.  If still having pain we will repeat x-rays.  If x-rays show fracture healing, could contemplate wrist injection if needed.  Trial of Voltaren gel topically.   Follow-Up Instructions: No  follow-ups on file.      Procedures: No procedures performed  No notes on file    PMFS History: Patient Active Problem List   Diagnosis Date Noted  . Unstable angina ([place]) 05/08/2014  . Esophageal reflux   . Chest pain 04/18/2014  . Mitral valve prolapse 04/18/2014  . Hypothyroid 04/18/2014  . Diabetes mellitus, type II ([place]) 04/18/2014  . Dyslipidemia 04/18/2014  . NSTEMI (non-ST elevated myocardial infarction) ([place])   . CAP (community acquired pneumonia)   . Pain in the chest    Past Medical History:  Diagnosis Date  . Anxiety   . Blood transfusion   . Diabetes mellitus    type II  . Heart murmur   . History of colon polyps   . Hyperlipidemia   . Hypertension   . MI (myocardial infarction) ([place])   . Mitral valve prolapse   . Osteoarthritis   . Osteoporosis   . Spinal stenosis   . Thyroid disease    hypothyroidism  . Venous insufficiency     Family History  Problem Relation Age of Onset  . Heart disease Father   . Cancer Sister        breast  . Cancer Daughter        cervical and bone    Past Surgical History:  Procedure Laterality Date  .  ABDOMINAL HYSTERECTOMY  11/26/1976  . APPENDECTOMY  02/14/1978  . BASAL CELL CARCINOMA EXCISION  01/26/1990  . COLONOSCOPY  1993, 2003, 2008, 2012  . DILATION AND CURETTAGE OF UTERUS  09/1976  . LEFT HEART CATHETERIZATION WITH CORONARY ANGIOGRAM N/A 04/18/2014   Procedure: LEFT HEART CATHETERIZATION WITH CORONARY ANGIOGRAM;  Surgeon: Peter M Martinique, MD;  Location: [facility] CATH LAB;  Service: Cardiovascular;  Laterality: N/A;  . MASS EXCISION  10/20/2011   Procedure: MINOR EXCISION OF MASS;  Surgeon: Adin Hector, MD;  Location: [facility];  Service: General;  Laterality: N/A;  Excision mass posterior neck  . OOPHORECTOMY  02/14/1978  . SHOULDER SURGERY  10/18/2007   basal cell  . SQUAMOUS CELL CARCINOMA EXCISION    . TONSILLECTOMY  1955  . TUBAL LIGATION  07/08/74   Social History   Occupational History   . Not on file  Tobacco Use  . Smoking status: Former Smoker    Last attempt to quit: 03/10/2005    Years since quitting: 13.0  . Smokeless tobacco: Never Used  Substance and Sexual Activity  . Alcohol use: No  . Drug use: No  . Sexual activity: Not on file

## 2018-04-20 ENCOUNTER — Ambulatory Visit (INDEPENDENT_AMBULATORY_CARE_PROVIDER_SITE_OTHER): Payer: Medicare Other | Admitting: Family Medicine

## 2018-04-21 ENCOUNTER — Ambulatory Visit (INDEPENDENT_AMBULATORY_CARE_PROVIDER_SITE_OTHER): Payer: Medicare Other | Admitting: Family Medicine

## 2018-04-21 ENCOUNTER — Ambulatory Visit (INDEPENDENT_AMBULATORY_CARE_PROVIDER_SITE_OTHER): Payer: Medicare Other

## 2018-04-21 ENCOUNTER — Encounter (INDEPENDENT_AMBULATORY_CARE_PROVIDER_SITE_OTHER): Payer: Self-pay | Admitting: Family Medicine

## 2018-04-21 DIAGNOSIS — M25531 Pain in right wrist: Secondary | ICD-10-CM | POA: Diagnosis not present

## 2018-04-21 NOTE — Progress Notes (Signed)
Office Visit Note   Patient: Desiree Leon           Date of Birth: 31-Jan-1941           MRN: [id number] Visit Date: 04/21/2018 Requested by: Burnard Bunting, MD [address] [place], [place] [address] PCP: Burnard Bunting, MD  Subjective: Chief Complaint  Patient presents with  . Right Wrist - Follow-up    Wearing removable thumb spica splint - helps. Still some occasional pain radial aspect.  . Right Elbow - Follow-up    Only occasional pain in elbow.    HPI: She is here for follow-up about 5 weeks status post fall resulting in right elbow radial head fracture union distal radius fracture at the wrist.  Pain improving but not completely, wrist bothers her the most.  Voltaren gel gives some relief.              ROS: Noncontributory  Objective: Vital Signs: There were no vitals taken for this visit.  Physical Exam:  Right elbow: Almost full range of motion, still slightly limited in flexion.  Good pronation and supination of the forearm.  Minimal tenderness at the radial head. Right wrist: No bruising or swelling, still slightly tender on the dorsal aspect of the distal radius and at the thumb CMC joint.  Imaging: X-rays right wrist: Good callus formation at the distal radius fracture site, almost completely healed.    Assessment & Plan: 1.  Clinically healing 5 weeks status post fall with right elbow radial head fracture and distal radius fracture at the wrist.  Underlying DJD at the thumb [facility] joint probably causing much of her pain. -Hand therapy referral to help with residual pain and stiffness.  CMC injection if symptoms persist.  Follow-up as needed.   Follow-Up Instructions: No follow-ups on file.      Procedures: No procedures performed  No notes on file    PMFS History: Patient Active Problem List   Diagnosis Date Noted  . Unstable angina ([place]) 05/08/2014  . Esophageal reflux   . Chest pain 04/18/2014  . Mitral valve prolapse 04/18/2014  .  Hypothyroid 04/18/2014  . Diabetes mellitus, type II ([place]) 04/18/2014  . Dyslipidemia 04/18/2014  . NSTEMI (non-ST elevated myocardial infarction) ([place])   . CAP (community acquired pneumonia)   . Pain in the chest    Past Medical History:  Diagnosis Date  . Anxiety   . Blood transfusion   . Diabetes mellitus    type II  . Heart murmur   . History of colon polyps   . Hyperlipidemia   . Hypertension   . MI (myocardial infarction) ([place])   . Mitral valve prolapse   . Osteoarthritis   . Osteoporosis   . Spinal stenosis   . Thyroid disease    hypothyroidism  . Venous insufficiency     Family History  Problem Relation Age of Onset  . Heart disease Father   . Cancer Sister        breast  . Cancer Daughter        cervical and bone    Past Surgical History:  Procedure Laterality Date  . ABDOMINAL HYSTERECTOMY  11/26/1976  . APPENDECTOMY  02/14/1978  . BASAL CELL CARCINOMA EXCISION  01/26/1990  . COLONOSCOPY  1993, 2003, 2008, 2012  . DILATION AND CURETTAGE OF UTERUS  09/1976  . LEFT HEART CATHETERIZATION WITH CORONARY ANGIOGRAM N/A 04/18/2014   Procedure: LEFT HEART CATHETERIZATION WITH CORONARY ANGIOGRAM;  Surgeon: Peter M Martinique,  MD;  Location: [facility] CATH LAB;  Service: Cardiovascular;  Laterality: N/A;  . MASS EXCISION  10/20/2011   Procedure: MINOR EXCISION OF MASS;  Surgeon: Adin Hector, MD;  Location: [facility];  Service: General;  Laterality: N/A;  Excision mass posterior neck  . OOPHORECTOMY  02/14/1978  . SHOULDER SURGERY  10/18/2007   basal cell  . SQUAMOUS CELL CARCINOMA EXCISION    . TONSILLECTOMY  1955  . TUBAL LIGATION  07/08/74   Social History   Occupational History  . Not on file  Tobacco Use  . Smoking status: Former Smoker    Last attempt to quit: 03/10/2005    Years since quitting: 13.1  . Smokeless tobacco: Never Used  Substance and Sexual Activity  . Alcohol use: No  . Drug use: No  . Sexual activity: Not on file

## 2018-04-26 ENCOUNTER — Other Ambulatory Visit: Payer: Self-pay | Admitting: Cardiology

## 2018-04-26 DIAGNOSIS — I209 Angina pectoris, unspecified: Secondary | ICD-10-CM

## 2018-04-26 MED ORDER — NITROGLYCERIN 0.4 MG/HR TD PT24: 0.4000 mg | MEDICATED_PATCH | Freq: Every day | TRANSDERMAL | 2 refills | Status: DC

## 2018-05-04 DIAGNOSIS — I130 Hypertensive heart and chronic kidney disease with heart failure and stage 1 through stage 4 chronic kidney disease, or unspecified chronic kidney disease: Secondary | ICD-10-CM | POA: Insufficient documentation

## 2018-05-24 ENCOUNTER — Other Ambulatory Visit: Payer: Self-pay

## 2018-05-24 DIAGNOSIS — I209 Angina pectoris, unspecified: Secondary | ICD-10-CM

## 2018-05-24 MED ORDER — NITROGLYCERIN 0.4 MG SL SUBL: 0.4000 mg | SUBLINGUAL_TABLET | SUBLINGUAL | 2 refills | Status: DC | PRN

## 2018-09-22 ENCOUNTER — Other Ambulatory Visit: Payer: Self-pay | Admitting: Cardiology

## 2018-10-18 DIAGNOSIS — R1310 Dysphagia, unspecified: Secondary | ICD-10-CM | POA: Insufficient documentation

## 2018-10-21 ENCOUNTER — Other Ambulatory Visit: Payer: Self-pay | Admitting: Cardiology

## 2018-11-03 ENCOUNTER — Other Ambulatory Visit: Payer: Self-pay | Admitting: Gastroenterology

## 2018-11-03 DIAGNOSIS — R1310 Dysphagia, unspecified: Secondary | ICD-10-CM

## 2018-11-05 ENCOUNTER — Ambulatory Visit
Admission: RE | Admit: 2018-11-05 | Discharge: 2018-11-05 | Disposition: A | Discharge status: Home / Self Care | Payer: Medicare Other | Source: Ambulatory Visit | Attending: Gastroenterology | Admitting: Gastroenterology

## 2018-11-05 DIAGNOSIS — R1310 Dysphagia, unspecified: Secondary | ICD-10-CM

## 2019-01-05 ENCOUNTER — Encounter: Payer: Self-pay | Admitting: Cardiology

## 2019-01-05 DIAGNOSIS — J449 Chronic obstructive pulmonary disease, unspecified: Secondary | ICD-10-CM | POA: Insufficient documentation

## 2019-01-06 ENCOUNTER — Ambulatory Visit: Payer: Self-pay | Admitting: Cardiology

## 2019-01-18 ENCOUNTER — Other Ambulatory Visit: Payer: Self-pay | Admitting: Cardiology

## 2019-01-18 DIAGNOSIS — I209 Angina pectoris, unspecified: Secondary | ICD-10-CM

## 2019-02-15 ENCOUNTER — Other Ambulatory Visit: Payer: Self-pay | Admitting: Cardiology

## 2019-02-15 DIAGNOSIS — I209 Angina pectoris, unspecified: Secondary | ICD-10-CM

## 2019-02-25 ENCOUNTER — Other Ambulatory Visit: Payer: Self-pay | Admitting: Cardiology

## 2019-03-08 ENCOUNTER — Ambulatory Visit (INDEPENDENT_AMBULATORY_CARE_PROVIDER_SITE_OTHER): Payer: Self-pay | Admitting: Cardiology

## 2019-03-08 ENCOUNTER — Encounter: Payer: Self-pay | Admitting: Cardiology

## 2019-03-08 ENCOUNTER — Other Ambulatory Visit: Payer: Self-pay

## 2019-03-08 ENCOUNTER — Encounter

## 2019-03-08 VITALS — BP 127/61 | HR 59 | Ht 64.0 in | Wt 175.7 lb

## 2019-03-08 DIAGNOSIS — E7800 Pure hypercholesterolemia, unspecified: Secondary | ICD-10-CM

## 2019-03-08 DIAGNOSIS — I10 Essential (primary) hypertension: Secondary | ICD-10-CM

## 2019-03-08 DIAGNOSIS — R0609 Other forms of dyspnea: Secondary | ICD-10-CM

## 2019-03-08 DIAGNOSIS — R0989 Other specified symptoms and signs involving the circulatory and respiratory systems: Secondary | ICD-10-CM

## 2019-03-08 DIAGNOSIS — I25118 Atherosclerotic heart disease of native coronary artery with other forms of angina pectoris: Secondary | ICD-10-CM

## 2019-03-08 DIAGNOSIS — R0600 Dyspnea, unspecified: Secondary | ICD-10-CM

## 2019-03-08 NOTE — Progress Notes (Signed)
Primary Physician/Referring:  Burnard Bunting, MD  Patient ID: Desiree Leon, female    DOB: Sep 10, 1940, 78 y.o.   MRN: [id number]  Chief Complaint  Patient presents with  . Coronary Artery Disease  . Hypertension  . Hyperlipidemia   HPI:    Desiree Leon  is a 78 y.o. Caucasian female  follow-up of coronary artery disease h/o NSTEMI on 04/18/2014 S/P balloon angioplasty of the LAD and D2 with 80% residual stenosis which was not amenable to further intervention due to the LAD being a small artery. and mild peripheral arterial disease, left leg sciatica and chronic back pain, hypertension and hyperlipidemia presents for annual visit.   She has had occasional episodes of angina pectoris responsive to nitroglycerin.  She had melanotic stools and syncope in Sept 2019 and needed iron transfusion. She has not had any recurrence of melena, presently doing well.  Her back pain and left leg sciatica has been bothering her.  She still continues to walk her dogs and has mild chronic dyspnea that is unchanged but no PND or orthopnea.  Past Medical History:  Diagnosis Date  . Anxiety   . Blood transfusion   . COPD (chronic obstructive pulmonary disease) ([place])   . Heart murmur   . History of colon polyps   . Hyperlipidemia   . Hypertension   . MI (myocardial infarction) ([place])   . Mitral valve prolapse   . NSTEMI (non-ST elevated myocardial infarction) ([place])   . Osteoarthritis   . Osteoporosis   . Spinal stenosis   . Thyroid disease    hypothyroidism  . Unstable angina ([place]) 05/08/2014  . Venous insufficiency    Past Surgical History:  Procedure Laterality Date  . ABDOMINAL HYSTERECTOMY  11/26/1976  . APPENDECTOMY  02/14/1978  . BASAL CELL CARCINOMA EXCISION  01/26/1990  . COLONOSCOPY  1993, 2003, 2008, 2012  . DILATION AND CURETTAGE OF UTERUS  09/1976  . LEFT HEART CATHETERIZATION WITH CORONARY ANGIOGRAM N/A 04/18/2014   Procedure: LEFT HEART CATHETERIZATION WITH CORONARY ANGIOGRAM;   Surgeon: Peter M Martinique, MD;  Location: [facility] CATH LAB;  Service: Cardiovascular;  Laterality: N/A;  . MASS EXCISION  10/20/2011   Procedure: MINOR EXCISION OF MASS;  Surgeon: Adin Hector, MD;  Location: [facility];  Service: General;  Laterality: N/A;  Excision mass posterior neck  . OOPHORECTOMY  02/14/1978  . SHOULDER SURGERY  10/18/2007   basal cell  . SQUAMOUS CELL CARCINOMA EXCISION    . TONSILLECTOMY  1955  . TUBAL LIGATION  07/08/74   Social History   Socioeconomic History  . Marital status: Widowed    Spouse name: Not on file  . Number of children: 3  . Years of education: Not on file  . Highest education level: Not on file  Occupational History  . Not on file  Tobacco Use  . Smoking status: Former Smoker    Quit date: 03/10/2005    Years since quitting: 14.0  . Smokeless tobacco: Never Used  Substance and Sexual Activity  . Alcohol use: No  . Drug use: No  . Sexual activity: Not on file  Other Topics Concern  . Not on file  Social History Narrative  . Not on file   Social Determinants of Health   Financial Resource Strain:   . Difficulty of Paying Living Expenses: Not on file  Food Insecurity:   . Worried About Charity fundraiser in the Last Year: Not on file  . Ran  Out of Food in the Last Year: Not on file  Transportation Needs:   . Lack of Transportation (Medical): Not on file  . Lack of Transportation (Non-Medical): Not on file  Physical Activity:   . Days of Exercise per Week: Not on file  . Minutes of Exercise per Session: Not on file  Stress:   . Feeling of Stress : Not on file  Social Connections:   . Frequency of Communication with Friends and Family: Not on file  . Frequency of Social Gatherings with Friends and Family: Not on file  . Attends Religious Services: Not on file  . Active Member of Clubs or Organizations: Not on file  . Attends Archivist Meetings: Not on file  . Marital Status: Not on file  Intimate Partner  Violence:   . Fear of Current or Ex-Partner: Not on file  . Emotionally Abused: Not on file  . Physically Abused: Not on file  . Sexually Abused: Not on file   ROS  Review of Systems  Constitution: Negative for chills, decreased appetite, malaise/fatigue and weight gain.  Cardiovascular: Positive for chest pain, claudication (neurogenic left leg) and dyspnea on exertion. Negative for leg swelling and syncope.  Endocrine: Negative for cold intolerance.  Hematologic/Lymphatic: Does not bruise/bleed easily.  Musculoskeletal: Positive for arthritis, back pain (left sided sciatica) and muscle cramps (leg cramps). Negative for joint swelling.  Gastrointestinal: Negative for abdominal pain, anorexia, change in bowel habit, hematochezia and melena.  Neurological: Negative for headaches and light-headedness.  Psychiatric/Behavioral: Negative for depression and substance abuse.  All other systems reviewed and are negative.  Objective  Blood pressure 127/61, pulse (!) 59, height _0  (1.626 m), weight 175 lb 11.2 oz (79.7 kg), SpO2 96 %.  Vitals with BMI 03/08/2019 03/13/2018 03/12/2018  Height _1  - _2   Weight 175 lbs 11 oz - 175 lbs  BMI 84.53 - 64.68  Systolic 032 122 482  Diastolic 61 55 60  Pulse 59 51 55     Physical Exam  Constitutional:  Moderately built and mildly obese in no acute distress.   HENT:  Head: Atraumatic.  Eyes: Conjunctivae are normal.  Neck: No thyromegaly present.  Cardiovascular: Normal rate, regular rhythm and normal heart sounds. Exam reveals no gallop.  No murmur heard. Pulses:      Carotid pulses are on the right side with bruit.      Popliteal pulses are 2+ on the right side and 2+ on the left side.       Dorsalis pedis pulses are 0 on the right side and 0 on the left side.       Posterior tibial pulses are 2+ on the right side and 2+ on the left side.  No leg edema, no JVD.  Pulmonary/Chest: Effort normal and breath sounds normal.  Abdominal: Soft.  Bowel sounds are normal.  Musculoskeletal:        General: Normal range of motion.     Cervical back: Neck supple.  Neurological: She is alert.  Skin: Skin is warm and dry.  Psychiatric: She has a normal mood and affect.   Laboratory examination:   No results for input(s): NA, K, CL, CO2, GLUCOSE, BUN, CREATININE, CALCIUM, GFRNONAA, GFRAA in the last 8760 hours. CrCl cannot be calculated (Patient's most recent lab result is older than the maximum 21 days allowed.).  CMP Latest Ref Rng & Units 11/07/2017 05/09/2014 05/08/2014  Glucose 70 - 99 mg/dL 135(H) 115(H) 81  BUN 8 -  23 mg/dL _0 Creatinine 0.44 - 1.00 mg/dL 0.81 0.70 0.77  Sodium 135 - 145 mmol/L 137 141 138  Potassium 3.5 - 5.1 mmol/L 4.1 4.5 4.0  Chloride 98 - 111 mmol/L 106 108 109  CO2 22 - 32 mmol/L _1 Calcium 8.9 - 10.3 mg/dL 8.8(L) 8.5 9.2  Total Protein 6.5 - 8.1 g/dL 5.9(L) - -  Total Bilirubin 0.3 - 1.2 mg/dL 0.5 - -  Alkaline Phos 38 - 126 U/L 76 - -  AST 15 - 41 U/L 18 - -  ALT 0 - 44 U/L 13 - -   CBC Latest Ref Rng & Units 11/07/2017 05/10/2014 05/09/2014  WBC 4.0 - 10.5 K/uL 8.8 7.3 9.1  Hemoglobin 12.0 - 15.0 g/dL 8.8(L) 11.8(L) 12.1  Hematocrit 36.0 - 46.0 % 29.5(L) 35.5(L) 35.7(L)  Platelets 150 - 400 K/uL 285 210 213   Lipid Panel     Component Value Date/Time   CHOL 125 04/18/2014 0600   TRIG 65 04/18/2014 0600   HDL 38 (L) 04/18/2014 0600   CHOLHDL 3.3 04/18/2014 0600   VLDL 13 04/18/2014 0600   LDLCALC 74 04/18/2014 0600   HEMOGLOBIN A1C Lab Results  Component Value Date   HGBA1C 6.0 (H) 04/20/2014   MPG 126 04/20/2014   TSH No results for input(s): TSH in the last 8760 hours.  01/07/2018: Hemoglobin A1c 5.3%.  12/31/2017: Creatinine 0.7, EGFR 80/97, potassium 4.6, CMP normal.  CBC normal.  Cholesterol 174, triglycerides 124, HDL 40, LDL 109.  TSH low at 0.2.  Free T4 normal at 1.4.  Medications and allergies   Allergies  Allergen Reactions  . Other Anaphylaxis and Cough     fragarances-perfumes  . Dilaudid [Hydromorphone Hcl] Nausea Only     Current Outpatient Medications  Medication Instructions  . ACCU-CHEK SMARTVIEW test strip No dose, route, or frequency recorded.  Marland Kitchen acetaminophen (TYLENOL) 500 mg, Oral, Every 6 hours PRN  . ALPRAZolam (XANAX) 0.125 mg, Oral, At bedtime PRN  . aspirin 81 mg, Oral, Daily  . Cholecalciferol (VITAMIN D3 GUMMIES) 25 MCG (1000 UT) CHEW Oral  . escitalopram (LEXAPRO) 5 mg, Oral, Daily  . iron polysaccharides (NIFEREX) 150 mg, Oral, Daily  . Levothyroxine Sodium 137 mcg, Oral, Daily before breakfast  . metoprolol tartrate (LOPRESSOR) 25 mg, Oral, 2 times daily  . niCARdipine (CARDENE) 20 mg, Oral, Every evening  . nitroGLYCERIN (NITRODUR - DOSED IN MG/24 HR) 0.4 mg/hr patch APPLY 1 PATCH ONCE DAILY AS DIRECTED.  Marland Kitchen pantoprazole (PROTONIX) 40 mg, Oral, Daily  . RANEXA 500 MG 12 hr tablet TAKE 1 TABLET EVERY 12 HOURS.  Marland Kitchen VYTORIN 10-20 MG per tablet 1 tablet, Oral, Daily-1800    Radiology:  No results found.  Cardiac Studies:   Coronary angiogram 04/18/2014:  PTCA and suboptimal balloon angioplasty of mid LAD and D2.  Residual 80% stenosis in the distal LAD not amenable to intervention. LAD collateralized by RCA. Normal LVEF.  Echo- 05/03/14 1. Left ventricle cavity is normal in size. Mild concentric hypertrophy of the left ventricle. Normal global wall motion. Calculated EF 63%. 2. Mild mitral regurgitation. 3. Mild tricuspid regurgitation. No evidence of pulmonary hypertension  Exercise myoview stress 08/11/2014: 1. The resting electrocardiogram demonstrated normal sinus rhythm, normal resting conduction, no resting arrhythmias and normal rest repolarization.  The stress electrocardiogram was normal.   The patient performed treadmill exercise using a Bruce protocol, completing 5:30 minutes.The patient completed an estimated workload of 7.05 METS, 88% of the  maximum predicted heart rate.  The stress test was terminated  because of fatigue and THR met. 2. Myocardial perfusion imaging is normal. Overall left ventricular systolic function was normal without regional wall motion abnormalities. The left ventricular ejection fraction was 75%.  Assessment     ICD-10-CM   1. Coronary artery disease of native artery of native heart with stable angina pectoris ([place])  I25.118 EKG 12-Lead  2. Primary hypertension  I10   3. Hypercholesteremia  E78.00   4. Dyspnea on exertion  R06.00   5. Right carotid bruit  R09.89 PCV CAROTID DUPLEX (BILATERAL)    EKG 03/08/2019: Normal sinus rhythm at the rate of 60 bpm, left atrial abnormality, normal axis.  No evidence of ischemia, normal QT interval.   Recommendations:  No orders of the defined types were placed in this encounter.   Desiree Leon  is a 78 y.o. Caucasian female  follow-up of coronary artery disease h/o NSTEMI on 04/18/2014 S/P balloon angioplasty of the LAD and D2 with 80% residual stenosis which was not amenable to further intervention due to the LAD being a small artery. and mild peripheral arterial disease, left leg sciatica and chronic back pain, hypertension and hyperlipidemia presents for annual visit.    She is here on annual visit, she has had NTG responsive chest pain, stable angina class 1-2.  No clinical evidence of heart failure.  Blood pressure is well controlled.  Lipids are being managed by PCP.  With regard to chronic dyspnea this is remained stable. She has not had any further claudication symptoms except neurogenic claudication from left leg sciatica and severe back pain.  No changes in the medications were done today.  She has not had carotid artery surveillance Dopplers due to carotid bruit, I'll set this up.  Otherwise I'll see her back on an annual basis. She has not had any further GI bleed since Sept 2019, presently on PPI.  Adrian Prows, MD, [facility] 03/08/2019, 10:58 AM [facility] [place] Pager: [phone] Office: [phone]

## 2019-03-10 ENCOUNTER — Ambulatory Visit: Payer: Self-pay | Admitting: Cardiology

## 2019-03-17 ENCOUNTER — Other Ambulatory Visit: Payer: Self-pay | Admitting: Cardiology

## 2019-03-17 DIAGNOSIS — I209 Angina pectoris, unspecified: Secondary | ICD-10-CM

## 2019-03-18 ENCOUNTER — Telehealth: Payer: Self-pay

## 2019-03-18 DIAGNOSIS — I209 Angina pectoris, unspecified: Secondary | ICD-10-CM

## 2019-03-18 MED ORDER — NITROGLYCERIN 0.4 MG/HR TD PT24: MEDICATED_PATCH | TRANSDERMAL | 0 refills | Status: DC

## 2019-03-18 NOTE — Telephone Encounter (Signed)
Nitro sent in to pharmacy

## 2019-03-23 ENCOUNTER — Ambulatory Visit (INDEPENDENT_AMBULATORY_CARE_PROVIDER_SITE_OTHER): Payer: Medicare PPO

## 2019-03-23 ENCOUNTER — Other Ambulatory Visit: Payer: Self-pay

## 2019-03-23 DIAGNOSIS — R0989 Other specified symptoms and signs involving the circulatory and respiratory systems: Secondary | ICD-10-CM | POA: Diagnosis not present

## 2019-03-23 DIAGNOSIS — I6521 Occlusion and stenosis of right carotid artery: Secondary | ICD-10-CM

## 2019-03-27 ENCOUNTER — Other Ambulatory Visit: Payer: Self-pay | Admitting: Cardiology

## 2019-03-27 DIAGNOSIS — I6521 Occlusion and stenosis of right carotid artery: Secondary | ICD-10-CM

## 2019-03-27 DIAGNOSIS — R0989 Other specified symptoms and signs involving the circulatory and respiratory systems: Secondary | ICD-10-CM

## 2019-03-28 ENCOUNTER — Other Ambulatory Visit: Payer: Self-pay | Admitting: Cardiology

## 2019-04-04 DIAGNOSIS — J13 Pneumonia due to Streptococcus pneumoniae: Secondary | ICD-10-CM | POA: Insufficient documentation

## 2019-04-11 ENCOUNTER — Emergency Department (HOSPITAL_COMMUNITY): Payer: Medicare PPO

## 2019-04-11 ENCOUNTER — Other Ambulatory Visit: Payer: Self-pay

## 2019-04-11 ENCOUNTER — Observation Stay (HOSPITAL_COMMUNITY)
Admission: EM | Admit: 2019-04-11 | Discharge: 2019-04-12 | Disposition: A | Discharge status: Home / Self Care | Payer: Medicare PPO | Attending: Internal Medicine | Admitting: Internal Medicine

## 2019-04-11 DIAGNOSIS — E785 Hyperlipidemia, unspecified: Secondary | ICD-10-CM | POA: Diagnosis not present

## 2019-04-11 DIAGNOSIS — Z9981 Dependence on supplemental oxygen: Secondary | ICD-10-CM | POA: Insufficient documentation

## 2019-04-11 DIAGNOSIS — I2694 Multiple subsegmental pulmonary emboli without acute cor pulmonale: Principal | ICD-10-CM | POA: Insufficient documentation

## 2019-04-11 DIAGNOSIS — Z79890 Hormone replacement therapy (postmenopausal): Secondary | ICD-10-CM | POA: Diagnosis not present

## 2019-04-11 DIAGNOSIS — I6521 Occlusion and stenosis of right carotid artery: Secondary | ICD-10-CM | POA: Diagnosis not present

## 2019-04-11 DIAGNOSIS — R0602 Shortness of breath: Secondary | ICD-10-CM | POA: Diagnosis present

## 2019-04-11 DIAGNOSIS — Z79899 Other long term (current) drug therapy: Secondary | ICD-10-CM | POA: Insufficient documentation

## 2019-04-11 DIAGNOSIS — Z20822 Contact with and (suspected) exposure to covid-19: Secondary | ICD-10-CM | POA: Insufficient documentation

## 2019-04-11 DIAGNOSIS — Z8701 Personal history of pneumonia (recurrent): Secondary | ICD-10-CM | POA: Insufficient documentation

## 2019-04-11 DIAGNOSIS — J189 Pneumonia, unspecified organism: Secondary | ICD-10-CM | POA: Diagnosis present

## 2019-04-11 DIAGNOSIS — E039 Hypothyroidism, unspecified: Secondary | ICD-10-CM | POA: Diagnosis not present

## 2019-04-11 DIAGNOSIS — Z9861 Coronary angioplasty status: Secondary | ICD-10-CM | POA: Insufficient documentation

## 2019-04-11 DIAGNOSIS — Z7982 Long term (current) use of aspirin: Secondary | ICD-10-CM | POA: Insufficient documentation

## 2019-04-11 DIAGNOSIS — D509 Iron deficiency anemia, unspecified: Secondary | ICD-10-CM | POA: Diagnosis not present

## 2019-04-11 DIAGNOSIS — R0902 Hypoxemia: Secondary | ICD-10-CM | POA: Diagnosis not present

## 2019-04-11 DIAGNOSIS — Z85828 Personal history of other malignant neoplasm of skin: Secondary | ICD-10-CM | POA: Diagnosis not present

## 2019-04-11 DIAGNOSIS — I2699 Other pulmonary embolism without acute cor pulmonale: Secondary | ICD-10-CM | POA: Diagnosis present

## 2019-04-11 DIAGNOSIS — F419 Anxiety disorder, unspecified: Secondary | ICD-10-CM | POA: Diagnosis not present

## 2019-04-11 DIAGNOSIS — Z86711 Personal history of pulmonary embolism: Secondary | ICD-10-CM | POA: Diagnosis present

## 2019-04-11 DIAGNOSIS — J449 Chronic obstructive pulmonary disease, unspecified: Secondary | ICD-10-CM | POA: Diagnosis present

## 2019-04-11 DIAGNOSIS — I252 Old myocardial infarction: Secondary | ICD-10-CM | POA: Diagnosis not present

## 2019-04-11 DIAGNOSIS — K2100 Gastro-esophageal reflux disease with esophagitis, without bleeding: Secondary | ICD-10-CM | POA: Insufficient documentation

## 2019-04-11 DIAGNOSIS — E119 Type 2 diabetes mellitus without complications: Secondary | ICD-10-CM | POA: Diagnosis not present

## 2019-04-11 DIAGNOSIS — I10 Essential (primary) hypertension: Secondary | ICD-10-CM

## 2019-04-11 DIAGNOSIS — Z862 Personal history of diseases of the blood and blood-forming organs and certain disorders involving the immune mechanism: Secondary | ICD-10-CM

## 2019-04-11 DIAGNOSIS — I209 Angina pectoris, unspecified: Secondary | ICD-10-CM | POA: Insufficient documentation

## 2019-04-11 DIAGNOSIS — K219 Gastro-esophageal reflux disease without esophagitis: Secondary | ICD-10-CM | POA: Diagnosis present

## 2019-04-11 DIAGNOSIS — Z87891 Personal history of nicotine dependence: Secondary | ICD-10-CM | POA: Insufficient documentation

## 2019-04-11 LAB — RESPIRATORY PANEL BY RT PCR (FLU A&B, COVID)
Influenza A by PCR: NEGATIVE
Influenza B by PCR: NEGATIVE
SARS Coronavirus 2 by RT PCR: NEGATIVE

## 2019-04-11 LAB — CBC WITH DIFFERENTIAL/PLATELET
Abs Immature Granulocytes: 0.01 10*3/uL (ref 0.00–0.07)
Basophils Absolute: 0 10*3/uL (ref 0.0–0.1)
Basophils Relative: 0 %
Eosinophils Absolute: 0.1 10*3/uL (ref 0.0–0.5)
Eosinophils Relative: 1 %
HCT: 37.4 % (ref 36.0–46.0)
Hemoglobin: 12.2 g/dL (ref 12.0–15.0)
Immature Granulocytes: 0 %
Lymphocytes Relative: 41 %
Lymphs Abs: 3.1 10*3/uL (ref 0.7–4.0)
MCH: 31.5 pg (ref 26.0–34.0)
MCHC: 32.6 g/dL (ref 30.0–36.0)
MCV: 96.6 fL (ref 80.0–100.0)
Monocytes Absolute: 0.5 10*3/uL (ref 0.1–1.0)
Monocytes Relative: 6 %
Neutro Abs: 3.9 10*3/uL (ref 1.7–7.7)
Neutrophils Relative %: 52 %
Platelets: 197 10*3/uL (ref 150–400)
RBC: 3.87 MIL/uL (ref 3.87–5.11)
RDW: 13.6 % (ref 11.5–15.5)
WBC: 7.6 10*3/uL (ref 4.0–10.5)
nRBC: 0 % (ref 0.0–0.2)

## 2019-04-11 LAB — BASIC METABOLIC PANEL
Anion gap: 10 (ref 5–15)
BUN: 22 mg/dL (ref 8–23)
CO2: 23 mmol/L (ref 22–32)
Calcium: 9 mg/dL (ref 8.9–10.3)
Chloride: 106 mmol/L (ref 98–111)
Creatinine, Ser: 0.8 mg/dL (ref 0.44–1.00)
GFR calc Af Amer: >60 mL/min (ref >60)
GFR calc non Af Amer: >60 mL/min (ref >60)
Glucose, Bld: 113 mg/dL — ABNORMAL HIGH (ref 70–99)
Potassium: 4 mmol/L (ref 3.5–5.1)
Sodium: 139 mmol/L (ref 135–145)

## 2019-04-11 LAB — TROPONIN I (HIGH SENSITIVITY)
Troponin I (High Sensitivity): 4 ng/L (ref <18)
Troponin I (High Sensitivity): 3 ng/L (ref <18)

## 2019-04-11 LAB — BRAIN NATRIURETIC PEPTIDE: B Natriuretic Peptide: 85.1 pg/mL (ref 0.0–100.0)

## 2019-04-11 LAB — LACTIC ACID, PLASMA: Lactic Acid, Venous: 1.2 mmol/L (ref 0.5–1.9)

## 2019-04-11 MED ORDER — ENOXAPARIN SODIUM 80 MG/0.8ML ~~LOC~~ SOLN
1.0000 mg/kg | Freq: Two times a day (BID) | SUBCUTANEOUS | Status: DC
  Administered 2019-04-11 – 2019-04-12 (×2): 80 mg via SUBCUTANEOUS
  Filled 2019-04-11 (×2): qty 0.8

## 2019-04-11 MED ORDER — CEFDINIR 300 MG PO CAPS
300.0000 mg | ORAL_CAPSULE | Freq: Two times a day (BID) | ORAL | Status: DC
  Administered 2019-04-12: 300 mg via ORAL
  Filled 2019-04-11 (×4): qty 1

## 2019-04-11 MED ORDER — PANTOPRAZOLE SODIUM 40 MG PO TBEC
40.0000 mg | DELAYED_RELEASE_TABLET | Freq: Two times a day (BID) | ORAL | Status: DC
  Administered 2019-04-11 – 2019-04-12 (×2): 40 mg via ORAL
  Filled 2019-04-11 (×2): qty 1

## 2019-04-11 MED ORDER — POLYSACCHARIDE IRON COMPLEX 150 MG PO CAPS
150.0000 mg | ORAL_CAPSULE | Freq: Every day | ORAL | Status: DC
  Administered 2019-04-12: 150 mg via ORAL
  Filled 2019-04-11: qty 1

## 2019-04-11 MED ORDER — RANOLAZINE ER 500 MG PO TB12
500.0000 mg | ORAL_TABLET | Freq: Two times a day (BID) | ORAL | Status: DC
  Administered 2019-04-11 – 2019-04-12 (×2): 500 mg via ORAL
  Filled 2019-04-11 (×3): qty 1

## 2019-04-11 MED ORDER — SIMVASTATIN 20 MG PO TABS
20.0000 mg | ORAL_TABLET | Freq: Every day | ORAL | Status: DC
  Administered 2019-04-11: 20 mg via ORAL
  Filled 2019-04-11: qty 1

## 2019-04-11 MED ORDER — EZETIMIBE-SIMVASTATIN 10-20 MG PO TABS
1.0000 | ORAL_TABLET | Freq: Every day | ORAL | Status: DC
  Filled 2019-04-11: qty 1

## 2019-04-11 MED ORDER — EZETIMIBE 10 MG PO TABS
10.0000 mg | ORAL_TABLET | Freq: Every day | ORAL | Status: DC
  Administered 2019-04-11: 10 mg via ORAL
  Filled 2019-04-11 (×2): qty 1

## 2019-04-11 MED ORDER — NICARDIPINE HCL 20 MG PO CAPS: 20.0000 mg | ORAL_CAPSULE | Freq: Every evening | ORAL | Status: DC

## 2019-04-11 MED ORDER — ACETAMINOPHEN 500 MG PO TABS: 500.0000 mg | ORAL_TABLET | Freq: Four times a day (QID) | ORAL | Status: DC | PRN

## 2019-04-11 MED ORDER — METOPROLOL TARTRATE 25 MG PO TABS
25.0000 mg | ORAL_TABLET | Freq: Two times a day (BID) | ORAL | Status: DC
  Administered 2019-04-11 – 2019-04-12 (×2): 25 mg via ORAL
  Filled 2019-04-11 (×2): qty 1

## 2019-04-11 MED ORDER — ESCITALOPRAM OXALATE 10 MG PO TABS
5.0000 mg | ORAL_TABLET | Freq: Every evening | ORAL | Status: DC
  Administered 2019-04-11: 5 mg via ORAL
  Filled 2019-04-11: qty 1

## 2019-04-11 MED ORDER — ALPRAZOLAM 0.25 MG PO TABS
0.1250 mg | ORAL_TABLET | Freq: Every evening | ORAL | Status: DC | PRN
  Filled 2019-04-11: qty 1

## 2019-04-11 MED ORDER — ASPIRIN 81 MG PO CHEW
81.0000 mg | CHEWABLE_TABLET | Freq: Every day | ORAL | Status: DC
  Administered 2019-04-11: 81 mg via ORAL
  Filled 2019-04-11: qty 1

## 2019-04-11 MED ORDER — LEVOTHYROXINE SODIUM 137 MCG PO TABS
137.0000 ug | ORAL_TABLET | Freq: Every day | ORAL | Status: DC
  Administered 2019-04-12: 137 ug via ORAL
  Filled 2019-04-11: qty 1

## 2019-04-11 MED ORDER — NICARDIPINE HCL 20 MG PO CAPS
20.0000 mg | ORAL_CAPSULE | Freq: Every evening | ORAL | Status: DC
  Filled 2019-04-11 (×2): qty 1

## 2019-04-11 MED ORDER — NITROGLYCERIN 0.4 MG/HR TD PT24
0.4000 mg | MEDICATED_PATCH | Freq: Every day | TRANSDERMAL | Status: DC
  Administered 2019-04-12: 0.4 mg via TRANSDERMAL
  Filled 2019-04-11: qty 1

## 2019-04-11 MED ORDER — EZETIMIBE 10 MG PO TABS: 10.0000 mg | ORAL_TABLET | Freq: Every day | ORAL | Status: DC

## 2019-04-11 MED ORDER — IOHEXOL 350 MG/ML SOLN
80.0000 mL | Freq: Once | INTRAVENOUS | Status: AC | PRN
  Administered 2019-04-11: 80 mL via INTRAVENOUS

## 2019-04-11 NOTE — ED Notes (Signed)
ED TO INPATIENT HANDOFF REPORT  ED Nurse Name and Phone #: [phone] Lauree Chandler., RN  S Name/Age/Gender Desiree Leon 79 y.o. female Room/Bed: 010C/010C  Code Status   Code Status: Partial Code  Home/SNF/Other Home Patient oriented to: self, place, time and situation Is this baseline? Yes   Triage Complete: Triage complete  Chief Complaint Pulmonary embolism [facility]) [I26.99]  Triage Note Pt presents to the ED with low oxygen saturations at home. Per patient she was diagnosed with pneumonia last Monday and has been placed on antibiotics since then. Pt reports she checks her SpO2 at home and yesterday her saturations were in the 60's when she woke up so she placed herself back on her home oxygen that she typically only wears at night. Pt also reports this morning her saturations were in the 70's. Pt's granddaughter called her doctor and they recommended the patient come to the ED. Pt 99% on room air at present time, respirations regular and unlabored. Denying any pain but reporting that she feels "awful."     Allergies Allergies  Allergen Reactions  . Other Anaphylaxis and Cough    fragarances-perfumes  . Dilaudid [Hydromorphone Hcl] Nausea Only    Level of Care/Admitting Diagnosis ED Disposition    ED Disposition Condition [facility] Area: [place] [[id number]]  Level of Care: Telemetry Medical [104]  Covid Evaluation: Asymptomatic Screening Protocol (No Symptoms)  Diagnosis: Pulmonary embolism [facility]) [id number]  Admitting Physician: Orene Desanctis [id number]  Attending Physician: Orene Desanctis [id number]  Estimated length of stay: past midnight tomorrow  Certification:: I certify this patient will need inpatient services for at least 2 midnights       B Medical/Surgery History Past Medical History:  Diagnosis Date  . Anxiety   . Blood transfusion   . COPD (chronic obstructive pulmonary disease) ([place])   . Heart murmur   . History of colon  polyps   . Hyperlipidemia   . Hypertension   . MI (myocardial infarction) ([place])   . Mitral valve prolapse   . NSTEMI (non-ST elevated myocardial infarction) ([place])   . Osteoarthritis   . Osteoporosis   . Spinal stenosis   . Thyroid disease    hypothyroidism  . Unstable angina ([place]) 05/08/2014  . Venous insufficiency    Past Surgical History:  Procedure Laterality Date  . ABDOMINAL HYSTERECTOMY  11/26/1976  . APPENDECTOMY  02/14/1978  . BASAL CELL CARCINOMA EXCISION  01/26/1990  . COLONOSCOPY  1993, 2003, 2008, 2012  . DILATION AND CURETTAGE OF UTERUS  09/1976  . LEFT HEART CATHETERIZATION WITH CORONARY ANGIOGRAM N/A 04/18/2014   Procedure: LEFT HEART CATHETERIZATION WITH CORONARY ANGIOGRAM;  Surgeon: Peter M Martinique, MD;  Location: [facility] CATH LAB;  Service: Cardiovascular;  Laterality: N/A;  . MASS EXCISION  10/20/2011   Procedure: MINOR EXCISION OF MASS;  Surgeon: Adin Hector, MD;  Location: [place];  Service: General;  Laterality: N/A;  Excision mass posterior neck  . OOPHORECTOMY  02/14/1978  . SHOULDER SURGERY  10/18/2007   basal cell  . SQUAMOUS CELL CARCINOMA EXCISION    . TONSILLECTOMY  1955  . TUBAL LIGATION  07/08/74     A IV Location/Drains/Wounds Patient Lines/Drains/Airways Status   Active Line/Drains/Airways    Name:   Placement date:   Placement time:   Site:   Days:   Peripheral IV 04/11/19 Left Forearm   04/11/19    1212    Forearm   less  than 1          Intake/Output Last 24 hours No intake or output data in the 24 hours ending 04/11/19 2350  Labs/Imaging Results for orders placed or performed during the hospital encounter of 04/11/19 (from the past 48 hour(s))  CBC with Differential     Status: None   Collection Time: 04/11/19 12:47 PM  Result Value Ref Range   WBC 7.6 4.0 - 10.5 K/uL   RBC 3.87 3.87 - 5.11 MIL/uL   Hemoglobin 12.2 12.0 - 15.0 g/dL   HCT 37.4 36.0 - 46.0 %   MCV 96.6 80.0 - 100.0 fL   MCH 31.5 26.0 - 34.0 pg   MCHC  32.6 30.0 - 36.0 g/dL   RDW 13.6 11.5 - 15.5 %   Platelets 197 150 - 400 K/uL   nRBC 0.0 0.0 - 0.2 %   Neutrophils Relative % 52 %   Neutro Abs 3.9 1.7 - 7.7 K/uL   Lymphocytes Relative 41 %   Lymphs Abs 3.1 0.7 - 4.0 K/uL   Monocytes Relative 6 %   Monocytes Absolute 0.5 0.1 - 1.0 K/uL   Eosinophils Relative 1 %   Eosinophils Absolute 0.1 0.0 - 0.5 K/uL   Basophils Relative 0 %   Basophils Absolute 0.0 0.0 - 0.1 K/uL   Immature Granulocytes 0 %   Abs Immature Granulocytes 0.01 0.00 - 0.07 K/uL    Comment: Performed at [facility], [address] [place], [place] [id number]  Basic metabolic panel     Status: Abnormal   Collection Time: 04/11/19 12:47 PM  Result Value Ref Range   Sodium 139 135 - 145 mmol/L   Potassium 4.0 3.5 - 5.1 mmol/L   Chloride 106 98 - 111 mmol/L   CO2 23 22 - 32 mmol/L   Glucose, Bld 113 (H) 70 - 99 mg/dL   BUN 22 8 - 23 mg/dL   Creatinine, Ser 0.80 0.44 - 1.00 mg/dL   Calcium 9.0 8.9 - 10.3 mg/dL   GFR calc non Af Amer >60 >60 mL/min   GFR calc Af Amer >60 >60 mL/min   Anion gap 10 5 - 15    Comment: Performed at [facility] [address] [place], [place] [address]  Brain natriuretic peptide     Status: None   Collection Time: 04/11/19 12:47 PM  Result Value Ref Range   B Natriuretic Peptide 85.1 0.0 - 100.0 pg/mL    Comment: Performed at [facility] [address], [place], [place] [address]  Troponin I (High Sensitivity)     Status: None   Collection Time: 04/11/19 12:47 PM  Result Value Ref Range   Troponin I (High Sensitivity) 4 <18 ng/L    Comment: (NOTE) Elevated high sensitivity troponin I (hsTnI) values and significant  changes across serial measurements may suggest ACS but many other  chronic and acute conditions are known to elevate hsTnI results.  Refer to the "Links" section for chest pain algorithms and additional  guidance. Performed at [facility], [place] [address], [place], [place] [address]    Lactic acid, plasma     Status: None   Collection Time: 04/11/19 12:47 PM  Result Value Ref Range   Lactic Acid, Venous 1.2 0.5 - 1.9 mmol/L    Comment: Performed at [facility] [address] [place], [place] [address]  Troponin I (High Sensitivity)     Status: None   Collection  Time: 04/11/19  3:05 PM  Result Value Ref Range   Troponin I (High Sensitivity) 3 <18 ng/L    Comment: (NOTE) Elevated high sensitivity troponin I (hsTnI) values and significant  changes across serial measurements may suggest ACS but many other  chronic and acute conditions are known to elevate hsTnI results.  Refer to the "Links" section for chest pain algorithms and additional  guidance. Performed at [facility], [place] [address] [place], [place] [address]   Respiratory Panel by RT PCR (Flu A&B, Covid) - Nasopharyngeal Swab     Status: None   Collection Time: 04/11/19  4:43 PM   Specimen: Nasopharyngeal Swab  Result Value Ref Range   SARS Coronavirus 2 by RT PCR NEGATIVE NEGATIVE    Comment: (NOTE) SARS-CoV-2 target nucleic acids are NOT DETECTED. The SARS-CoV-2 RNA is generally detectable in upper respiratoy specimens during the acute phase of infection. The lowest concentration of SARS-CoV-2 viral copies this assay can detect is 131 copies/mL. A negative result does not preclude SARS-Cov-2 infection and should not be used as the sole basis for treatment or other patient management decisions. A negative result may occur with  improper specimen collection/handling, submission of specimen other than nasopharyngeal swab, presence of viral mutation(s) within the areas targeted by this assay, and inadequate number of viral copies (<131 copies/mL). A negative result must be combined with clinical observations, patient history, and epidemiological information. The expected result is Negative. Fact Sheet for Patients:  PinkCheek.be Fact Sheet for Healthcare  Providers:  GravelBags.it This test is not yet ap proved or cleared by the [place] FDA and  has been authorized for detection and/or diagnosis of SARS-CoV-2 by FDA under an Emergency Use Authorization (EUA). This EUA will remain  in effect (meaning this test can be used) for the duration of the COVID-19 declaration under Section 564(b)(1) of the Act, 21 U.S.C. section 360bbb-3(b)(1), unless the authorization is terminated or revoked sooner.    Influenza A by PCR NEGATIVE NEGATIVE   Influenza B by PCR NEGATIVE NEGATIVE    Comment: (NOTE) The Xpert Xpress SARS-CoV-2/FLU/RSV assay is intended as an aid in  the diagnosis of influenza from Nasopharyngeal swab specimens and  should not be used as a sole basis for treatment. Nasal washings and  aspirates are unacceptable for Xpert Xpress SARS-CoV-2/FLU/RSV  testing. Fact Sheet for Patients: PinkCheek.be Fact Sheet for Healthcare Providers: GravelBags.it This test is not yet approved or cleared by the [place] FDA and  has been authorized for detection and/or diagnosis of SARS-CoV-2 by  FDA under an Emergency Use Authorization (EUA). This EUA will remain  in effect (meaning this test can be used) for the duration of the  Covid-19 declaration under Section 564(b)(1) of the Act, 21  U.S.C. section 360bbb-3(b)(1), unless the authorization is  terminated or revoked. Performed at [facility], [place] [address], [place], [place] [address]    CT Angio Chest PE W/Cm &/Or Wo Cm  Result Date: 04/11/2019 CLINICAL DATA:  Hypoxemia. Shortness of breath. EXAM: CT ANGIOGRAPHY CHEST WITH CONTRAST TECHNIQUE: Multidetector CT imaging of the chest was performed using the standard protocol during bolus administration of intravenous contrast. Multiplanar CT image reconstructions and MIPs were obtained to evaluate the vascular anatomy. CONTRAST:  68 mL OMNIPAQUE  IOHEXOL 350 MG/ML SOLN COMPARISON:  Chest x-ray dated 03/12/2019 FINDINGS: Cardiovascular: There are 2 small peripheral pulmonary emboli in the right lower lobe. No other pulmonary emboli. Aortic atherosclerosis. RV LV ratio is normal.  Mild cardiomegaly. No pericardial effusion. Mediastinum/Nodes: No hilar or mediastinal adenopathy. Esophagus is normal. Thyroid gland is not identified. Lungs/Pleura: No infiltrates or effusions. There is several small blebs in the right lower lobe. Minimal atelectasis in the lingula. Upper Abdomen: Negative. Musculoskeletal: No chest wall abnormality. No acute or significant osseous findings. Review of the MIP images confirms the above findings. IMPRESSION: Two small peripheral pulmonary emboli in the right lower lobe. Mild cardiomegaly. Electronically Signed   By: Lorriane Shire M.D.   On: 04/11/2019 16:14   DG Chest Port 1 View  Result Date: 04/11/2019 CLINICAL DATA:  Shortness of breath. EXAM: PORTABLE CHEST 1 VIEW COMPARISON:  March 12, 2018. FINDINGS: The heart size and mediastinal contours are within normal limits. Both lungs are clear. Atherosclerosis of thoracic aorta is noted. No pneumothorax or pleural effusion is noted. The visualized skeletal structures are unremarkable. IMPRESSION: No active disease. Aortic Atherosclerosis (ICD10-I70.0). Electronically Signed   By: Marijo Conception M.D.   On: 04/11/2019 12:42    Pending Labs Unresulted Labs (From admission, onward)    Start     Ordered   04/12/19 [id number]  Basic metabolic panel  Tomorrow morning,   R     04/11/19 1911   04/12/19 0500  CBC  Tomorrow morning,   R     04/11/19 1911   04/11/19 1829  Protime-INR  ONCE - STAT,   STAT     04/11/19 1828   04/11/19 1225  Culture, blood (routine x 2)  BLOOD CULTURE X 2,   STAT     04/11/19 1225   04/11/19 1225  Lactic acid, plasma  Now then every 2 hours,   STAT     04/11/19 1225          Vitals/Pain Today's Vitals   04/11/19 2027 04/11/19 2100 04/11/19 2200  04/11/19 2300  BP:  (!) 147/54 139/60 127/63  Pulse:  (!) 55 (!) 51 (!) 55  Resp:  13 15 14   Temp:      TempSrc:      SpO2:  96% 91% 97%  Weight:      Height:      PainSc: 0-No pain       Isolation Precautions No active isolations  Medications Medications  enoxaparin (LOVENOX) injection 80 mg (80 mg Subcutaneous Given 04/11/19 2000)  acetaminophen (TYLENOL) tablet 500 mg (has no administration in time range)  aspirin chewable tablet 81 mg (81 mg Oral Given 04/11/19 2333)  cefdinir (OMNICEF) capsule 300 mg (300 mg Oral Not Given 04/11/19 2333)  metoprolol tartrate (LOPRESSOR) tablet 25 mg (25 mg Oral Given 04/11/19 2332)  nitroGLYCERIN (NITRODUR - Dosed in mg/24 hr) patch 0.4 mg (has no administration in time range)  ranolazine (RANEXA) 12 hr tablet 500 mg (has no administration in time range)  ALPRAZolam (XANAX) tablet 0.125 mg (has no administration in time range)  escitalopram (LEXAPRO) tablet 5 mg (5 mg Oral Given 04/11/19 2334)  levothyroxine (SYNTHROID) tablet 137 mcg (has no administration in time range)  pantoprazole (PROTONIX) EC tablet 40 mg (40 mg Oral Given 04/11/19 2334)  iron polysaccharides (NIFEREX) capsule 150 mg (has no administration in time range)  simvastatin (ZOCOR) tablet 20 mg (20 mg Oral Given 04/11/19 2332)  ezetimibe (ZETIA) tablet 10 mg (10 mg Oral Given 04/11/19 2331)  niCARdipine (CARDENE) capsule 20 mg (has no administration in time range)  iohexol (OMNIPAQUE) 350 MG/ML injection 80 mL (80 mLs Intravenous Contrast Given 04/11/19 1545)    Mobility walks Low  fall risk   Focused Assessments Pulmonary Assessment Handoff:  Lung sounds: L Breath Sounds: Clear R Breath Sounds: Clear O2 Device: Room Air        R Recommendations: See Admitting Provider Note  Report given to:   Additional Notes:

## 2019-04-11 NOTE — ED Notes (Signed)
Dinner tray ordered.

## 2019-04-11 NOTE — ED Notes (Signed)
Admitting MD at bedside.

## 2019-04-11 NOTE — H&P (Signed)
History and Physical    Desiree Leon [id number] DOB: 1940-03-22 DOA: 04/11/2019  PCP: Burnard Bunting, MD  Patient coming from: Home, lives alone but daughter lives nearby  I have personally briefly reviewed patient's old medical records in [place]  Chief Complaint: hypoxia and shortness of breath  HPI: Desiree Leon is a 79 y.o. female with medical history significant of Hx of /o NSTEMI on 04/18/2014 S/P  balloon angioplasty of the LAD and D2 with 80% residual stenosis which was not amenable to further intervention due to the LAD being a small artery, COPD on 2.5L at night, Mitral valve prolapse, right ICA stenosis, Type 2 diabetes, hypothyroidism, GERD who presents with concerns of hypoxia and shortness of breath.   She was recently diagnosed with pneumonia a week ago and placed on Doxycycline and Omnicef. She finished her doxycycline and still has 3 more days of Omnicef. Has been feeling shortness of breath all week at rest and with ambulation. Has headache, cough and runny nose. No fever. No decease appetite. No nausea, vomiting or diarrhea. No abdominal pain. Found her O2 saturation to be in the 50s yesterday and had to use her nighttime O2 during the day. Today she woke up with saturation in the 60s. Granddaughter called her doctor and she was recommended to come into the ED.   On arrival, she was afebrile, normotensive and bradycardic down to 50-60bpm but had O2 saturation of 99% on room air. Feels some mid-sternal chest pressure but states it happens when she does not get her "heart medication" by 6pm.  CBC and BMP were unremarkable.  Troponin of 4 and 3. BMP of 85. Negative flu and POC COVID test.  CTA showed two small peripheral pulmonary emboli in the right lower lobe.    Review of Systems:  Constitutional: No Weight Change, No Fever ENT/Mouth: No sore throat, No Rhinorrhea Eyes:No Vision Changes Cardiovascular: + Chest Pain, + SOB, + Dyspnea on  Exertion Respiratory: + Cough, No Sputum Gastrointestinal: No Nausea, No Vomiting, No Diarrhea, No Constipation, No Pain Genitourinary: no Urinary Incontinence Musculoskeletal: No Arthralgias, No Myalgias Skin: No Skin Lesions, No Pruritus, Neuro: no Weakness, No Numbness,  No Loss of Consciousness, No Syncope Psych: No Anxiety/Panic, No Depression, no decrease appetite Heme/Lymph: No Bruising, No Bleeding  Past Medical History:  Diagnosis Date  . Anxiety   . Blood transfusion   . COPD (chronic obstructive pulmonary disease) ([place])   . Heart murmur   . History of colon polyps   . Hyperlipidemia   . Hypertension   . MI (myocardial infarction) ([place])   . Mitral valve prolapse   . NSTEMI (non-ST elevated myocardial infarction) ([place])   . Osteoarthritis   . Osteoporosis   . Spinal stenosis   . Thyroid disease    hypothyroidism  . Unstable angina ([place]) 05/08/2014  . Venous insufficiency     Past Surgical History:  Procedure Laterality Date  . ABDOMINAL HYSTERECTOMY  11/26/1976  . APPENDECTOMY  02/14/1978  . BASAL CELL CARCINOMA EXCISION  01/26/1990  . COLONOSCOPY  1993, 2003, 2008, 2012  . DILATION AND CURETTAGE OF UTERUS  09/1976  . LEFT HEART CATHETERIZATION WITH CORONARY ANGIOGRAM N/A 04/18/2014   Procedure: LEFT HEART CATHETERIZATION WITH CORONARY ANGIOGRAM;  Surgeon: Peter M Martinique, MD;  Location: [facility] CATH LAB;  Service: Cardiovascular;  Laterality: N/A;  . MASS EXCISION  10/20/2011   Procedure: MINOR EXCISION OF MASS;  Surgeon: Adin Hector, MD;  Location: [place];  Service: General;  Laterality: N/A;  Excision mass posterior neck  . OOPHORECTOMY  02/14/1978  . SHOULDER SURGERY  10/18/2007   basal cell  . SQUAMOUS CELL CARCINOMA EXCISION    . TONSILLECTOMY  1955  . TUBAL LIGATION  07/08/74     reports that she quit smoking about 14 years ago. She has never used smokeless tobacco. She reports that she does not drink alcohol or use drugs.  Allergies   Allergen Reactions  . Other Anaphylaxis and Cough    fragarances-perfumes  . Dilaudid [Hydromorphone Hcl] Nausea Only    Family History  Problem Relation Age of Onset  . Heart disease Mother   . Heart disease Father   . Cancer Sister        breast  . Cancer Daughter        cervical and bone  . Heart disease Sister   . Thyroid disease Sister      Prior to Admission medications   Medication Sig Start Date End Date Taking? Authorizing Provider  acetaminophen (TYLENOL) 500 MG tablet Take 500 mg by mouth every 6 (six) hours as needed for fever or headache.    Yes [provider]  ALPRAZolam (XANAX) 0.25 MG tablet Take 0.125 mg by mouth at bedtime as needed for anxiety.    Yes [provider]  aspirin 81 MG chewable tablet Chew 81 mg by mouth at bedtime.    Yes [provider]  cefdinir (OMNICEF) 300 MG capsule Take 300 mg by mouth 2 (two) times daily. 04/04/19  Yes [provider]  Cholecalciferol (VITAMIN D3 GUMMIES) 25 MCG (1000 UT) CHEW Chew 2,000 Units by mouth daily.    Yes [provider]  escitalopram (LEXAPRO) 5 MG tablet Take 5 mg by mouth every evening.    Yes [provider]  fluticasone (FLONASE) 50 MCG/ACT nasal spray Place 2 sprays into both nostrils daily. 04/04/19  Yes [provider]  iron polysaccharides (NIFEREX) 150 MG capsule Take 150 mg by mouth daily.   Yes [provider]  Levothyroxine Sodium 137 MCG CAPS Take 137 mcg by mouth daily before breakfast.  08/25/11  Yes [provider]  metoprolol tartrate (LOPRESSOR) 25 MG tablet Take 25 mg by mouth 2 (two) times daily.  09/12/11  Yes [provider]  multivitamin-iron-minerals-folic acid (CENTRUM) chewable tablet Chew 1 tablet by mouth daily.   Yes [provider]  niCARdipine (CARDENE) 20 MG capsule Take 20 mg by mouth every evening.  09/12/11  Yes [provider]  nitroGLYCERIN (NITRODUR - DOSED IN MG/24 HR) 0.4  mg/hr patch APPLY 1 PATCH ONCE DAILY AS DIRECTED. Patient taking differently: Place 0.4 mg onto the skin daily.  03/18/19  Yes Adrian Prows, MD  pantoprazole (PROTONIX) 40 MG tablet Take 1 tablet (40 mg total) by mouth daily at 12 noon. Patient taking differently: Take 40 mg by mouth 2 (two) times daily.  04/21/14  Yes Barton Dubois, MD  Polyethyl Glycol-Propyl Glycol (SYSTANE) 0.4-0.3 % SOLN Place 1 drop into both eyes See admin instructions. Once to twice daily as needed for dry eyes   Yes [provider]  Probiotic Product (ALIGN) 4 MG CAPS Take 4 mg by mouth daily.   Yes [provider]  RANEXA 500 MG 12 hr tablet TAKE 1 TABLET EVERY 12 HOURS. Patient taking differently: Take 500 mg by mouth 2 (two) times daily.  03/28/19  Yes Adrian Prows, MD  VYTORIN 10-20 MG per tablet Take 1 tablet by  mouth daily at 6 PM.  09/18/11  Yes [provider]  ACCU-CHEK SMARTVIEW test strip  10/30/17   [provider]  albuterol (VENTOLIN HFA) 108 (90 Base) MCG/ACT inhaler Inhale 1-2 puffs into the lungs every 4 (four) hours as needed for shortness of breath or wheezing. 04/04/19   [provider]  doxycycline (VIBRA-TABS) 100 MG tablet Take 100 mg by mouth 2 (two) times daily. 04/04/19   [provider]    Physical Exam: Vitals:   04/11/19 1530 04/11/19 1645 04/11/19 1700 04/11/19 1830  BP: (!) 128/57 (!) 144/66 125/61   Pulse: (!) 51 (!) 55 (!) 57 (!) 54  Resp: 16 15 18 14   Temp:      TempSrc:      SpO2: 97% 98% 94% 97%  Weight:      Height:        Constitutional: NAD, calm, comfortable, non-toxic appearing female laying flat in bed Vitals:   04/11/19 1530 04/11/19 1645 04/11/19 1700 04/11/19 1830  BP: (!) 128/57 (!) 144/66 125/61   Pulse: (!) 51 (!) 55 (!) 57 (!) 54  Resp: 16 15 18 14   Temp:      TempSrc:      SpO2: 97% 98% 94% 97%  Weight:      Height:       Eyes: PERRL, lids and conjunctivae normal ENMT: Mucous membranes are moist.  Neck:  normal, supple Respiratory: Faint crackles throughout. Normal respiratory effort on room air. No accessory muscle use.  Cardiovascular: sinus bradycardia, no murmurs / rubs / gallops. No extremity edema. Abdomen: no tenderness, no masses palpated.Bowel sounds positive.  Musculoskeletal: no clubbing / cyanosis. No joint deformity upper and lower extremities. Good ROM, no contractures. Normal muscle tone.  Skin: no rashes, lesions, ulcers. No induration Neurologic: CN 2-12 grossly intact. Sensation intact. Strength 5/5 in all 4.  Psychiatric: Normal judgment and insight. Alert and oriented x 3. Normal mood.     Labs on Admission: I have personally reviewed following labs and imaging studies  CBC: Recent Labs  Lab 04/11/19 1247  WBC 7.6  NEUTROABS 3.9  HGB 12.2  HCT 37.4  MCV 96.6  PLT [id number]   Basic Metabolic Panel: Recent Labs  Lab 04/11/19 1247  NA 139  K 4.0  CL 106  CO2 23  GLUCOSE 113*  BUN 22  CREATININE 0.80  CALCIUM 9.0   GFR: Estimated Creatinine Clearance: 58.2 mL/min (by C-G formula based on SCr of 0.8 mg/dL). Liver Function Tests: No results for input(s): AST, ALT, ALKPHOS, BILITOT, PROT, ALBUMIN in the last 168 hours. No results for input(s): LIPASE, AMYLASE in the last 168 hours. No results for input(s): AMMONIA in the last 168 hours. Coagulation Profile: No results for input(s): INR, PROTIME in the last 168 hours. Cardiac Enzymes: No results for input(s): CKTOTAL, CKMB, CKMBINDEX, TROPONINI in the last 168 hours. BNP (last 3 results) No results for input(s): PROBNP in the last 8760 hours. HbA1C: No results for input(s): HGBA1C in the last 72 hours. CBG: No results for input(s): GLUCAP in the last 168 hours. Lipid Profile: No results for input(s): CHOL, HDL, LDLCALC, TRIG, CHOLHDL, LDLDIRECT in the last 72 hours. Thyroid Function Tests: No results for input(s): TSH, T4TOTAL, FREET4, T3FREE, THYROIDAB in the last 72 hours. Anemia Panel: No results for  input(s): VITAMINB12, FOLATE, FERRITIN, TIBC, IRON, RETICCTPCT in the last 72 hours. Urine analysis:    Component Value Date/Time   COLORURINE YELLOW 06/18/2013 [place]  06/18/2013 1625   LABSPEC 1.013 06/18/2013 1625   PHURINE 7.0 06/18/2013 1625   GLUCOSEU NEGATIVE 06/18/2013 1625   HGBUR NEGATIVE 06/18/2013 1625   BILIRUBINUR NEGATIVE 06/18/2013 1625   KETONESUR NEGATIVE 06/18/2013 1625   PROTEINUR NEGATIVE 06/18/2013 1625   UROBILINOGEN 0.2 06/18/2013 1625   NITRITE NEGATIVE 06/18/2013 1625   LEUKOCYTESUR TRACE (A) 06/18/2013 1625    Radiological Exams on Admission: CT Angio Chest PE W/Cm &/Or Wo Cm  Result Date: 04/11/2019 CLINICAL DATA:  Hypoxemia. Shortness of breath. EXAM: CT ANGIOGRAPHY CHEST WITH CONTRAST TECHNIQUE: Multidetector CT imaging of the chest was performed using the standard protocol during bolus administration of intravenous contrast. Multiplanar CT image reconstructions and MIPs were obtained to evaluate the vascular anatomy. CONTRAST:  68 mL OMNIPAQUE IOHEXOL 350 MG/ML SOLN COMPARISON:  Chest x-ray dated 03/12/2019 FINDINGS: Cardiovascular: There are 2 small peripheral pulmonary emboli in the right lower lobe. No other pulmonary emboli. Aortic atherosclerosis. RV LV ratio is normal. Mild cardiomegaly. No pericardial effusion. Mediastinum/Nodes: No hilar or mediastinal adenopathy. Esophagus is normal. Thyroid gland is not identified. Lungs/Pleura: No infiltrates or effusions. There is several small blebs in the right lower lobe. Minimal atelectasis in the lingula. Upper Abdomen: Negative. Musculoskeletal: No chest wall abnormality. No acute or significant osseous findings. Review of the MIP images confirms the above findings. IMPRESSION: Two small peripheral pulmonary emboli in the right lower lobe. Mild cardiomegaly. Electronically Signed   By: Lorriane Shire M.D.   On: 04/11/2019 16:14   DG Chest Port 1 View  Result Date: 04/11/2019 CLINICAL DATA:   Shortness of breath. EXAM: PORTABLE CHEST 1 VIEW COMPARISON:  March 12, 2018. FINDINGS: The heart size and mediastinal contours are within normal limits. Both lungs are clear. Atherosclerosis of thoracic aorta is noted. No pneumothorax or pleural effusion is noted. The visualized skeletal structures are unremarkable. IMPRESSION: No active disease. Aortic Atherosclerosis (ICD10-I70.0). Electronically Signed   By: Marijo Conception M.D.   On: 04/11/2019 12:42    EKG: Independently reviewed.   Assessment/Plan Peripheral pulmonary emboli in the right lower lobe continue therapeutic Lovenox.  Monitor CBC Pt noted to have melanotic stools and syncope in Sept 2019 requiring iron transfusion but has done well since being on iron supplementation  Hx of Recent Community acquired pneumonia Finished 7 day course of Doxycycline Continue Omnicef- has 3 more days   COPD with noctural hypoxia Not in exacerbation Continue 2.5L O2 at night  Chronic angina pectoris Stable Continue Ranexa, nitroglycerin patch  HTN  Continue metoprolol and nicardipine  HLD Continue Vytorin  Type 2 diabetes Diet controlled. Monitor without sliding scale for now.  Hypothyroidism Continue levothyroxine   GERD Continue PPI  Hx Iron deficiency anemia Continue iron supplement   DVT prophylaxis:.Lovenox therapeutic Code Status: Limited code-no intubation but allows for CPR and ACLS family Communication: Plan discussed with patient at bedside  disposition Plan: Home with at least 2 midnight stays  Consults called:  Admission status: inpatient  Zylie Mumaw T Laityn Bensen DO [facility]   If 7PM-7AM, please contact night-coverage [email or web address]   04/11/2019, 7:27 PM

## 2019-04-11 NOTE — ED Provider Notes (Signed)
Patient turned over to me patient had significant shortness of breath this morning.  May have had some yesterday.  Spoke with her daughter.  Work-up included CT angio chest which showed to right lower lobe small pulmonary embolus.  No evidence of any right heart strain.  Patient not hypoxic.  Covid testing negative.  Will discuss with the [facility] hospitalist.  Will hold on anticoagulation until I spoke to the hospitalist.  Results for orders placed or performed during the hospital encounter of 04/11/19  Respiratory Panel by RT PCR (Flu A&B, Covid) - Nasopharyngeal Swab   Specimen: Nasopharyngeal Swab  Result Value Ref Range   SARS Coronavirus 2 by RT PCR NEGATIVE NEGATIVE   Influenza A by PCR NEGATIVE NEGATIVE   Influenza B by PCR NEGATIVE NEGATIVE  CBC with Differential  Result Value Ref Range   WBC 7.6 4.0 - 10.5 K/uL   RBC 3.87 3.87 - 5.11 MIL/uL   Hemoglobin 12.2 12.0 - 15.0 g/dL   HCT 37.4 36.0 - 46.0 %   MCV 96.6 80.0 - 100.0 fL   MCH 31.5 26.0 - 34.0 pg   MCHC 32.6 30.0 - 36.0 g/dL   RDW 13.6 11.5 - 15.5 %   Platelets 197 150 - 400 K/uL   nRBC 0.0 0.0 - 0.2 %   Neutrophils Relative % 52 %   Neutro Abs 3.9 1.7 - 7.7 K/uL   Lymphocytes Relative 41 %   Lymphs Abs 3.1 0.7 - 4.0 K/uL   Monocytes Relative 6 %   Monocytes Absolute 0.5 0.1 - 1.0 K/uL   Eosinophils Relative 1 %   Eosinophils Absolute 0.1 0.0 - 0.5 K/uL   Basophils Relative 0 %   Basophils Absolute 0.0 0.0 - 0.1 K/uL   Immature Granulocytes 0 %   Abs Immature Granulocytes 0.01 0.00 - 0.07 K/uL  Basic metabolic panel  Result Value Ref Range   Sodium 139 135 - 145 mmol/L   Potassium 4.0 3.5 - 5.1 mmol/L   Chloride 106 98 - 111 mmol/L   CO2 23 22 - 32 mmol/L   Glucose, Bld 113 (H) 70 - 99 mg/dL   BUN 22 8 - 23 mg/dL   Creatinine, Ser 0.80 0.44 - 1.00 mg/dL   Calcium 9.0 8.9 - 10.3 mg/dL   GFR calc non Af Amer >60 >60 mL/min   GFR calc Af Amer >60 >60 mL/min   Anion gap 10 5 - 15  Brain natriuretic peptide   Result Value Ref Range   B Natriuretic Peptide 85.1 0.0 - 100.0 pg/mL  Lactic acid, plasma  Result Value Ref Range   Lactic Acid, Venous 1.2 0.5 - 1.9 mmol/L  Troponin I (High Sensitivity)  Result Value Ref Range   Troponin I (High Sensitivity) 4 <18 ng/L  Troponin I (High Sensitivity)  Result Value Ref Range   Troponin I (High Sensitivity) 3 <18 ng/L   CT Angio Chest PE W/Cm &/Or Wo Cm  Result Date: 04/11/2019 CLINICAL DATA:  Hypoxemia. Shortness of breath. EXAM: CT ANGIOGRAPHY CHEST WITH CONTRAST TECHNIQUE: Multidetector CT imaging of the chest was performed using the standard protocol during bolus administration of intravenous contrast. Multiplanar CT image reconstructions and MIPs were obtained to evaluate the vascular anatomy. CONTRAST:  68 mL OMNIPAQUE IOHEXOL 350 MG/ML SOLN COMPARISON:  Chest x-ray dated 03/12/2019 FINDINGS: Cardiovascular: There are 2 small peripheral pulmonary emboli in the right lower lobe. No other pulmonary emboli. Aortic atherosclerosis. RV LV ratio is normal. Mild cardiomegaly. No pericardial effusion. Mediastinum/Nodes:  No hilar or mediastinal adenopathy. Esophagus is normal. Thyroid gland is not identified. Lungs/Pleura: No infiltrates or effusions. There is several small blebs in the right lower lobe. Minimal atelectasis in the lingula. Upper Abdomen: Negative. Musculoskeletal: No chest wall abnormality. No acute or significant osseous findings. Review of the MIP images confirms the above findings. IMPRESSION: Two small peripheral pulmonary emboli in the right lower lobe. Mild cardiomegaly. Electronically Signed   By: Lorriane Shire M.D.   On: 04/11/2019 16:14   DG Chest Port 1 View  Result Date: 04/11/2019 CLINICAL DATA:  Shortness of breath. EXAM: PORTABLE CHEST 1 VIEW COMPARISON:  March 12, 2018. FINDINGS: The heart size and mediastinal contours are within normal limits. Both lungs are clear. Atherosclerosis of thoracic aorta is noted. No pneumothorax or  pleural effusion is noted. The visualized skeletal structures are unremarkable. IMPRESSION: No active disease. Aortic Atherosclerosis (ICD10-I70.0). Electronically Signed   By: Marijo Conception M.D.   On: 04/11/2019 12:42   PCV CAROTID DUPLEX (BILATERAL)  Result Date: 03/27/2019 Carotid artery duplex  03/23/2019: Stenosis in the right internal carotid artery (16-49%). Peak systolic velocities in the left bifurcation, internal, external and common carotid arteries are within normal limits. Antegrade right vertebral artery flow. Antegrade left vertebral artery flow. Follow up in one year is appropriate if clinically indicated. Compared to 10/05/14, right ICA stenosis new.      Fredia Sorrow, MD 04/11/19 [phone]

## 2019-04-11 NOTE — ED Provider Notes (Signed)
[facility] EMERGENCY DEPARTMENT Provider Note   CSN: [id number] Arrival date & time: 04/11/19  1151     History Chief Complaint  Patient presents with  . Shortness of Breath    Desiree Leon is a 79 y.o. female.  She said about a week ago she started with head congestion runny nose sneezing.  She went to her primary care doctor where she tested negative for Covid but on chest x-ray they saw pneumonia.  They put her on an oral cephalosporin and doxycycline.  She said for the last 2 days she is feeling worse and more short of breath and her oxygen saturations were in the 50s and 60s.  Normally she is 85 to 90s.  Wears 2 and half liters nasal cannula at night.  The history is provided by the patient.  Shortness of Breath Severity:  Moderate Onset quality:  Gradual Duration:  1 week Timing:  Constant Progression:  Unchanged Chronicity:  New Context: URI   Relieved by:  Nothing Worsened by:  Activity Ineffective treatments:  Oxygen Associated symptoms: cough   Associated symptoms: no abdominal pain, no chest pain, no diaphoresis, no fever, no headaches, no hemoptysis, no neck pain, no rash, no sore throat, no syncope, no vomiting and no wheezing        Past Medical History:  Diagnosis Date  . Anxiety   . Blood transfusion   . COPD (chronic obstructive pulmonary disease) ([place])   . Heart murmur   . History of colon polyps   . Hyperlipidemia   . Hypertension   . MI (myocardial infarction) ([place])   . Mitral valve prolapse   . NSTEMI (non-ST elevated myocardial infarction) ([place])   . Osteoarthritis   . Osteoporosis   . Spinal stenosis   . Thyroid disease    hypothyroidism  . Unstable angina ([place]) 05/08/2014  . Venous insufficiency     Patient Active Problem List   Diagnosis Date Noted  . COPD (chronic obstructive pulmonary disease) ([place])   . Esophageal reflux   . Chest pain 04/18/2014  . Mitral valve prolapse 04/18/2014  . Hypothyroid 04/18/2014  .  Diabetes mellitus, type II ([place]) 04/18/2014  . Dyslipidemia 04/18/2014  . CAP (community acquired pneumonia)   . Pain in the chest     Past Surgical History:  Procedure Laterality Date  . ABDOMINAL HYSTERECTOMY  11/26/1976  . APPENDECTOMY  02/14/1978  . BASAL CELL CARCINOMA EXCISION  01/26/1990  . COLONOSCOPY  1993, 2003, 2008, 2012  . DILATION AND CURETTAGE OF UTERUS  09/1976  . LEFT HEART CATHETERIZATION WITH CORONARY ANGIOGRAM N/A 04/18/2014   Procedure: LEFT HEART CATHETERIZATION WITH CORONARY ANGIOGRAM;  Surgeon: Peter M Martinique, MD;  Location: [facility] CATH LAB;  Service: Cardiovascular;  Laterality: N/A;  . MASS EXCISION  10/20/2011   Procedure: MINOR EXCISION OF MASS;  Surgeon: Adin Hector, MD;  Location: [facility];  Service: General;  Laterality: N/A;  Excision mass posterior neck  . OOPHORECTOMY  02/14/1978  . SHOULDER SURGERY  10/18/2007   basal cell  . SQUAMOUS CELL CARCINOMA EXCISION    . TONSILLECTOMY  1955  . TUBAL LIGATION  07/08/74     OB History   No obstetric history on file.     Family History  Problem Relation Age of Onset  . Heart disease Mother   . Heart disease Father   . Cancer Sister        breast  . Cancer Daughter  cervical and bone  . Heart disease Sister   . Thyroid disease Sister     Social History   Tobacco Use  . Smoking status: Former Smoker    Quit date: 03/10/2005    Years since quitting: 14.0  . Smokeless tobacco: Never Used  Substance Use Topics  . Alcohol use: No  . Drug use: No    Home Medications Prior to Admission medications   Medication Sig Start Date End Date Taking? Authorizing Provider  ACCU-CHEK SMARTVIEW test strip  10/30/17   [provider]  acetaminophen (TYLENOL) 500 MG tablet Take 500 mg by mouth every 6 (six) hours as needed for fever or headache.     [provider]  ALPRAZolam Duanne Moron) 0.25 MG tablet Take 0.125 mg by mouth at bedtime as needed for anxiety.     [provider]  aspirin 81 MG chewable tablet Chew 81 mg by mouth daily.    [provider]  Cholecalciferol (VITAMIN D3 GUMMIES) 25 MCG (1000 UT) CHEW Chew by mouth.    [provider]  escitalopram (LEXAPRO) 10 MG tablet Take 5 mg by mouth daily.    [provider]  iron polysaccharides (NIFEREX) 150 MG capsule Take 150 mg by mouth daily.    [provider]  Levothyroxine Sodium 137 MCG CAPS Take 137 mcg by mouth daily before breakfast.  08/25/11   [provider]  metoprolol tartrate (LOPRESSOR) 25 MG tablet Take 25 mg by mouth 2 (two) times daily.  09/12/11   [provider]  niCARdipine (CARDENE) 20 MG capsule Take 20 mg by mouth every evening.  09/12/11   [provider]  nitroGLYCERIN (NITRODUR - DOSED IN MG/24 HR) 0.4 mg/hr patch APPLY 1 PATCH ONCE DAILY AS DIRECTED. 03/18/19   Adrian Prows, MD  pantoprazole (PROTONIX) 40 MG tablet Take 1 tablet (40 mg total) by mouth daily at 12 noon. 04/21/14   Barton Dubois, MD  RANEXA 500 MG 12 hr tablet TAKE 1 TABLET EVERY 12 HOURS. 03/28/19   Adrian Prows, MD  VYTORIN 10-20 MG per tablet Take 1 tablet by mouth daily at 6 PM.  09/18/11   [provider]    Allergies    Other and Dilaudid [hydromorphone hcl]  Review of Systems   Review of Systems  Constitutional: Positive for fatigue. Negative for diaphoresis and fever.  HENT: Positive for congestion, rhinorrhea, sinus pressure and sneezing. Negative for sore throat.   Eyes: Negative for visual disturbance.  Respiratory: Positive for cough and shortness of breath. Negative for hemoptysis and wheezing.   Cardiovascular: Negative for chest pain and syncope.  Gastrointestinal: Negative for abdominal pain and vomiting.  Genitourinary: Negative for dysuria.  Musculoskeletal: Negative for neck pain.  Skin: Negative for rash.  Neurological: Negative for headaches.    Physical Exam Updated Vital Signs BP (!) 141/67 (BP Location: Right  Arm)   Pulse (!) 59   Temp 98.8 F (37.1 C) (Oral)   Resp 18   Ht 5\' 4"  (1.626 m)   Wt 79.8 kg   SpO2 99%   BMI 30.21 kg/m   Physical Exam Vitals and nursing note reviewed.  Constitutional:      General: She is not in acute distress.    Appearance: She is well-developed.  HENT:     Head: Normocephalic and atraumatic.  Eyes:     Conjunctiva/sclera: Conjunctivae normal.  Cardiovascular:     Rate and Rhythm: Normal rate and regular rhythm.  Heart sounds: No murmur.  Pulmonary:     Effort: Pulmonary effort is normal. No respiratory distress.     Breath sounds: Normal breath sounds.  Abdominal:     Palpations: Abdomen is soft.     Tenderness: There is no abdominal tenderness.  Musculoskeletal:        General: Normal range of motion.     Cervical back: Neck supple.     Right lower leg: Edema (trace symetric) present.     Left lower leg: Edema present.  Skin:    General: Skin is warm and dry.     Capillary Refill: Capillary refill takes less than 2 seconds.  Neurological:     General: No focal deficit present.     Mental Status: She is alert.     ED Results / Procedures / Treatments   Labs (all labs ordered are listed, but only abnormal results are displayed) Labs Reviewed  BASIC METABOLIC PANEL - Abnormal; Notable for the following components:      Result Value   Glucose, Bld 113 (*)    All other components within normal limits  RESPIRATORY PANEL BY RT PCR (FLU A&B, COVID)  CULTURE, BLOOD (ROUTINE X 2)  CULTURE, BLOOD (ROUTINE X 2)  CBC WITH DIFFERENTIAL/PLATELET  BRAIN NATRIURETIC PEPTIDE  LACTIC ACID, PLASMA  LACTIC ACID, PLASMA  TROPONIN I (HIGH SENSITIVITY)  TROPONIN I (HIGH SENSITIVITY)    EKG EKG Interpretation  Date/Time:  Monday April 11 2019 12:09:25 EST Ventricular Rate:  58 PR Interval:    QRS Duration: 112 QT Interval:  430 QTC Calculation: 423 R Axis:   76 Text Interpretation: Sinus rhythm Borderline intraventricular conduction  delay Low voltage, precordial leads Baseline wander in lead(s) V5 No significant change since 8/19 Confirmed by Aletta Edouard [phone]) on 04/11/2019 12:23:22 PM   Radiology CT Angio Chest PE W/Cm &/Or Wo Cm  Result Date: 04/11/2019 CLINICAL DATA:  Hypoxemia. Shortness of breath. EXAM: CT ANGIOGRAPHY CHEST WITH CONTRAST TECHNIQUE: Multidetector CT imaging of the chest was performed using the standard protocol during bolus administration of intravenous contrast. Multiplanar CT image reconstructions and MIPs were obtained to evaluate the vascular anatomy. CONTRAST:  68 mL OMNIPAQUE IOHEXOL 350 MG/ML SOLN COMPARISON:  Chest x-ray dated 03/12/2019 FINDINGS: Cardiovascular: There are 2 small peripheral pulmonary emboli in the right lower lobe. No other pulmonary emboli. Aortic atherosclerosis. RV LV ratio is normal. Mild cardiomegaly. No pericardial effusion. Mediastinum/Nodes: No hilar or mediastinal adenopathy. Esophagus is normal. Thyroid gland is not identified. Lungs/Pleura: No infiltrates or effusions. There is several small blebs in the right lower lobe. Minimal atelectasis in the lingula. Upper Abdomen: Negative. Musculoskeletal: No chest wall abnormality. No acute or significant osseous findings. Review of the MIP images confirms the above findings. IMPRESSION: Two small peripheral pulmonary emboli in the right lower lobe. Mild cardiomegaly. Electronically Signed   By: Lorriane Shire M.D.   On: 04/11/2019 16:14   DG Chest Port 1 View  Result Date: 04/11/2019 CLINICAL DATA:  Shortness of breath. EXAM: PORTABLE CHEST 1 VIEW COMPARISON:  March 12, 2018. FINDINGS: The heart size and mediastinal contours are within normal limits. Both lungs are clear. Atherosclerosis of thoracic aorta is noted. No pneumothorax or pleural effusion is noted. The visualized skeletal structures are unremarkable. IMPRESSION: No active disease. Aortic Atherosclerosis (ICD10-I70.0). Electronically Signed   By: Marijo Conception M.D.    On: 04/11/2019 12:42    Procedures .Critical Care Performed by: Hayden Rasmussen, MD Authorized by: Hayden Rasmussen,  MD   Critical care provider statement:    Critical care time (minutes):  45   Critical care time was exclusive of:  Separately billable procedures and treating other patients   Critical care was necessary to treat or prevent imminent or life-threatening deterioration of the following conditions:  Respiratory failure   Critical care was time spent personally by me on the following activities:  Evaluation of patient's response to treatment, examination of patient, ordering and performing treatments and interventions, ordering and review of laboratory studies, ordering and review of radiographic studies, pulse oximetry, re-evaluation of patient's condition, obtaining history from patient or surrogate, review of old charts and development of treatment plan with patient or surrogate   I assumed direction of critical care for this patient from another provider in my specialty: no     (including critical care time)  Medications Ordered in ED Medications  iohexol (OMNIPAQUE) 350 MG/ML injection 80 mL (80 mLs Intravenous Contrast Given 04/11/19 1545)    ED Course  I have reviewed the triage vital signs and the nursing notes.  Pertinent labs & imaging results that were available during my care of the patient were reviewed by me and considered in my medical decision making (see chart for details).  Clinical Course as of Apr 10 1820  Mon Apr 10, 6462  8441 79 year old female here with hypoxia and generally not feeling well.  Treated for pneumonia about 5 days ago with a cephalosporin and doxy.  Differential includes worsening pneumonia, Covid, anemia, pneumothorax, PE   [MB]    Clinical Course User Index [MB] Hayden Rasmussen, MD   MDM Rules/Calculators/A&P                       Final Clinical Impression(s) / ED Diagnoses Final diagnoses:  Multiple subsegmental pulmonary  emboli without acute cor pulmonale [facility])    Rx / DC Orders ED Discharge Orders    None       Hayden Rasmussen, MD 04/11/19 [phone]

## 2019-04-11 NOTE — ED Triage Notes (Signed)
Pt presents to the ED with low oxygen saturations at home. Per patient she was diagnosed with pneumonia last Monday and has been placed on antibiotics since then. Pt reports she checks her SpO2 at home and yesterday her saturations were in the 60's when she woke up so she placed herself back on her home oxygen that she typically only wears at night. Pt also reports this morning her saturations were in the 70's. Pt's granddaughter called her doctor and they recommended the patient come to the ED. Pt 99% on room air at present time, respirations regular and unlabored. Denying any pain but reporting that she feels "awful."

## 2019-04-11 NOTE — ED Notes (Signed)
Did ekg shown to Dr Melina Copa patient is resting with call bell in reach

## 2019-04-11 NOTE — Progress Notes (Signed)
ANTICOAGULATION CONSULT NOTE - Initial Consult  Pharmacy Consult for lovenox Indication: pulmonary embolus  Allergies  Allergen Reactions  . Other Anaphylaxis and Cough    fragarances-perfumes  . Dilaudid [Hydromorphone Hcl] Nausea Only    Patient Measurements: Height: 5\' 4"  (162.6 cm) Weight: 176 lb (79.8 kg) IBW/kg (Calculated) : 54.7  Vital Signs: Temp: 98.8 F (37.1 C) (02/01 1210) Temp Source: Oral (02/01 1210) BP: 125/61 (02/01 1700) Pulse Rate: 54 (02/01 1830)  Labs: Recent Labs    04/11/19 1247 04/11/19 1505  HGB 12.2  --   HCT 37.4  --   PLT 197  --   CREATININE 0.80  --   TROPONINIHS 4 3    Estimated Creatinine Clearance: 58.2 mL/min (by C-G formula based on SCr of 0.8 mg/dL).   Medical History: Past Medical History:  Diagnosis Date  . Anxiety   . Blood transfusion   . COPD (chronic obstructive pulmonary disease) ([place])   . Heart murmur   . History of colon polyps   . Hyperlipidemia   . Hypertension   . MI (myocardial infarction) ([place])   . Mitral valve prolapse   . NSTEMI (non-ST elevated myocardial infarction) ([place])   . Osteoarthritis   . Osteoporosis   . Spinal stenosis   . Thyroid disease    hypothyroidism  . Unstable angina ([place]) 05/08/2014  . Venous insufficiency    Assessment: 58 YOF presenting with SOB , PE on CT angio in right lower lobe, not on anticoagulation PTA.    Goal of Therapy:  Anti-Xa level 0.6-1 units/ml 4hrs after LMWH dose given Monitor platelets by anticoagulation protocol: Yes   Plan:  Lovenox 1mg /kg SQ (80mg ) every 12 hours Monitor renal function, CBC, s/s bleeding  Bertis Ruddy, PharmD Clinical Pharmacist Please check AMION for all [place] numbers 04/11/2019 7:28 PM

## 2019-04-11 NOTE — ED Notes (Signed)
Contact Information- Chritine Paschal (daughter) [phone]

## 2019-04-11 NOTE — ED Notes (Signed)
MD at bedside with patient and on the phone with daughter to discuss plan of care

## 2019-04-12 ENCOUNTER — Encounter (HOSPITAL_COMMUNITY): Payer: Self-pay | Admitting: Family Medicine

## 2019-04-12 DIAGNOSIS — J449 Chronic obstructive pulmonary disease, unspecified: Secondary | ICD-10-CM | POA: Diagnosis not present

## 2019-04-12 DIAGNOSIS — I808 Phlebitis and thrombophlebitis of other sites: Secondary | ICD-10-CM

## 2019-04-12 DIAGNOSIS — K219 Gastro-esophageal reflux disease without esophagitis: Secondary | ICD-10-CM

## 2019-04-12 DIAGNOSIS — Z86711 Personal history of pulmonary embolism: Secondary | ICD-10-CM | POA: Diagnosis present

## 2019-04-12 DIAGNOSIS — E785 Hyperlipidemia, unspecified: Secondary | ICD-10-CM

## 2019-04-12 DIAGNOSIS — I2699 Other pulmonary embolism without acute cor pulmonale: Secondary | ICD-10-CM | POA: Diagnosis present

## 2019-04-12 DIAGNOSIS — I10 Essential (primary) hypertension: Secondary | ICD-10-CM

## 2019-04-12 HISTORY — DX: Other pulmonary embolism without acute cor pulmonale: I26.99

## 2019-04-12 LAB — CBC
HCT: 34.9 % — ABNORMAL LOW (ref 36.0–46.0)
Hemoglobin: 11.5 g/dL — ABNORMAL LOW (ref 12.0–15.0)
MCH: 31.6 pg (ref 26.0–34.0)
MCHC: 33 g/dL (ref 30.0–36.0)
MCV: 95.9 fL (ref 80.0–100.0)
Platelets: 173 10*3/uL (ref 150–400)
RBC: 3.64 MIL/uL — ABNORMAL LOW (ref 3.87–5.11)
RDW: 13.6 % (ref 11.5–15.5)
WBC: 9.6 10*3/uL (ref 4.0–10.5)
nRBC: 0 % (ref 0.0–0.2)

## 2019-04-12 LAB — BASIC METABOLIC PANEL
Anion gap: 8 (ref 5–15)
BUN: 17 mg/dL (ref 8–23)
CO2: 23 mmol/L (ref 22–32)
Calcium: 8.7 mg/dL — ABNORMAL LOW (ref 8.9–10.3)
Chloride: 106 mmol/L (ref 98–111)
Creatinine, Ser: 0.78 mg/dL (ref 0.44–1.00)
GFR calc Af Amer: >60 mL/min (ref >60)
GFR calc non Af Amer: >60 mL/min (ref >60)
Glucose, Bld: 106 mg/dL — ABNORMAL HIGH (ref 70–99)
Potassium: 3.7 mmol/L (ref 3.5–5.1)
Sodium: 137 mmol/L (ref 135–145)

## 2019-04-12 LAB — PROTIME-INR
INR: 1.1 (ref 0.8–1.2)
Prothrombin Time: 14.1 seconds (ref 11.4–15.2)

## 2019-04-12 LAB — LACTIC ACID, PLASMA: Lactic Acid, Venous: 0.8 mmol/L (ref 0.5–1.9)

## 2019-04-12 MED ORDER — APIXABAN (ELIQUIS) VTE STARTER PACK (10MG AND 5MG): ORAL_TABLET | ORAL | 0 refills | Status: DC

## 2019-04-12 MED ORDER — CEPHALEXIN 500 MG PO CAPS: 500.0000 mg | ORAL_CAPSULE | Freq: Four times a day (QID) | ORAL | 0 refills | Status: AC

## 2019-04-12 MED FILL — ELIQUIS STARTER PACK 5 MG T: 5 | 30 days supply | Qty: 74 | Fill #0

## 2019-04-12 NOTE — Discharge Instructions (Addendum)
You were cared for by a hospitalist during your hospital stay. If you have any questions about your discharge medications or the care you received while you were in the hospital after you are discharged, you can call the unit and asked to speak with the hospitalist on call if the hospitalist that took care of you is not available. Once you are discharged, your primary care physician will handle any further medical issues. Please note that NO REFILLS for any discharge medications will be authorized once you are discharged, as it is imperative that you return to your primary care physician (or establish a relationship with a primary care physician if you do not have one) for your aftercare needs so that they can reassess your need for medications and monitor your lab values.  Please request your Prim.MD to go over all Hospital Tests and Procedure/Radiological results at the follow up, please get all Hospital records sent to your Prim MD by signing hospital release before you go home.  Get CBC, CMP, 2 view Chest X ray checked  by Primary MD during your next visit or SNF MD in 5-7 days ( we routinely change or add medications that can affect your baseline labs and fluid status, therefore we recommend that you get the mentioned basic workup next visit with your PCP, your PCP may decide not to get them or add new tests based on their clinical decision)  On your next visit with your primary care physician please Get Medicines reviewed and adjusted.  If you experience worsening of your admission symptoms, develop shortness of breath, life threatening emergency, suicidal or homicidal thoughts you must seek medical attention immediately by calling 911 or calling your MD immediately  if symptoms less severe.  You Must read complete instructions/literature along with all the possible adverse reactions/side effects for all the Medicines you take and that have been prescribed to you. Take any new Medicines after you  have completely understood and accpet all the possible adverse reactions/side effects.   Do not drive, operate heavy machinery, perform activities at heights, swimming or participation in water activities or provide baby sitting services if your were admitted for syncope or siezures until you have seen by Primary MD or a Neurologist and advised to do so again.  Do not drive when taking Pain medications.    Apixaban oral tablets What is this medicine? APIXABAN (a PIX a ban) is an anticoagulant (blood thinner). It is used to lower the chance of stroke in people with a medical condition called atrial fibrillation. It is also used to treat or prevent blood clots in the lungs or in the veins. This medicine may be used for other purposes; ask your health care provider or pharmacist if you have questions. COMMON BRAND NAME(S): Eliquis What should I tell my health care provider before I take this medicine? They need to know if you have any of these conditions:  antiphospholipid antibody syndrome  bleeding disorders  bleeding in the brain  blood in your stools (black or tarry stools) or if you have blood in your vomit  history of blood clots  history of stomach bleeding  kidney disease  liver disease  mechanical heart valve  an unusual or allergic reaction to apixaban, other medicines, foods, dyes, or preservatives  pregnant or trying to get pregnant  breast-feeding How should I use this medicine? Take this medicine by mouth with a glass of water. Follow the directions on the prescription label. You can take it with  or without food. If it upsets your stomach, take it with food. Take your medicine at regular intervals. Do not take it more often than directed. Do not stop taking except on your doctor's advice. Stopping this medicine may increase your risk of a blood clot. Be sure to refill your prescription before you run out of medicine. Talk to your pediatrician regarding the use of  this medicine in children. Special care may be needed. Overdosage: If you think you have taken too much of this medicine contact a poison control center or emergency room at once. NOTE: This medicine is only for you. Do not share this medicine with others. What if I miss a dose? If you miss a dose, take it as soon as you can. If it is almost time for your next dose, take only that dose. Do not take double or extra doses. What may interact with this medicine? This medicine may interact with the following:  aspirin and aspirin-like medicines  certain medicines for fungal infections like ketoconazole and itraconazole  certain medicines for seizures like carbamazepine and phenytoin  certain medicines that treat or prevent blood clots like warfarin, enoxaparin, and dalteparin  clarithromycin  NSAIDs, medicines for pain and inflammation, like ibuprofen or naproxen  rifampin  ritonavir  St. John's wort This list may not describe all possible interactions. Give your health care provider a list of all the medicines, herbs, non-prescription drugs, or dietary supplements you use. Also tell them if you smoke, drink alcohol, or use illegal drugs. Some items may interact with your medicine. What should I watch for while using this medicine? Visit your healthcare professional for regular checks on your progress. You may need blood work done while you are taking this medicine. Your condition will be monitored carefully while you are receiving this medicine. It is important not to miss any appointments. Avoid sports and activities that might cause injury while you are using this medicine. Severe falls or injuries can cause unseen bleeding. Be careful when using sharp tools or knives. Consider using an Copy. Take special care brushing or flossing your teeth. Report any injuries, bruising, or red spots on the skin to your healthcare professional. If you are going to need surgery or other  procedure, tell your healthcare professional that you are taking this medicine. Wear a medical ID bracelet or chain. Carry a card that describes your disease and details of your medicine and dosage times. What side effects may I notice from receiving this medicine? Side effects that you should report to your doctor or health care professional as soon as possible:  allergic reactions like skin rash, itching or hives, swelling of the face, lips, or tongue  signs and symptoms of bleeding such as bloody or black, tarry stools; red or dark-brown urine; spitting up blood or brown material that looks like coffee grounds; red spots on the skin; unusual bruising or bleeding from the eye, gums, or nose  signs and symptoms of a blood clot such as chest pain; shortness of breath; pain, swelling, or warmth in the leg  signs and symptoms of a stroke such as changes in vision; confusion; trouble speaking or understanding; severe headaches; sudden numbness or weakness of the face, arm or leg; trouble walking; dizziness; loss of coordination This list may not describe all possible side effects. Call your doctor for medical advice about side effects. You may report side effects to FDA at 1-800-FDA-1088. Where should I keep my medicine? Keep out of the reach  of children. Store at room temperature between 20 and 25 degrees C (68 and 77 degrees F). Throw away any unused medicine after the expiration date. NOTE: This sheet is a summary. It may not cover all possible information. If you have questions about this medicine, talk to your doctor, pharmacist, or health care provider.  2020 Elsevier/Gold Standard (2017-11-04 17:39:34)

## 2019-04-12 NOTE — Discharge Summary (Signed)
Physician Discharge Summary  Desiree Leon [id number] DOB: [birth year] DOA: 04/11/2019  PCP: Burnard Bunting, MD  Admit date: 04/11/2019 Discharge date: 04/12/2019  Admitted From: Home Disposition: Home  Recommendations for Outpatient Follow-up:  1. Follow up with PCP in 1-2 weeks 2. Please obtain BMP/CBC in one week your next doctors visit.  3. Eliquis starter pack prescribed, 30-day prescription given 4. Keflex for 5 days for right upper extremity thrombophlebitis  Home Health: None Equipment/Devices: None Discharge Condition: Stable CODE STATUS: Full Diet recommendation: Diabetic  Brief/Interim Summary: 79 year old with history of NSTEMI status post balloon angioplasty, COPD on 2.5 L nasal cannula at bedtime, MVP, right ICA stenosis, DM2, hypothyroidism, GERD presented with shortness of breath.  Recently diagnosed with pneumonia placed on doxycycline and Omnicef but still continued to feel short of breath therefore came to the hospital.  CTA chest in the hospital showed small bilateral pulmonary emboli.  No evidence of hemodynamic compromise was noted.  She was ambulating in the hallway saturating greater than 95% on room air without any evidence of hypoxia.  She felt great, she was discharged the following day on Eliquis.  She was educated on Eliquis.  Family was at bedside as well. Right upper extremity she was noted to have mild erythema and warmth therefore was prescribed 5 days of oral Keflex and advised to use compresses in that area. STable for discharge.   Discharge Diagnoses:  Principal Problem:   Pulmonary embolism ([place]) Active Problems:   Hypothyroid   Diabetes mellitus, type II ([place])   Dyslipidemia   CAP (community acquired pneumonia)   Esophageal reflux   COPD (chronic obstructive pulmonary disease) (HCC)   Essential hypertension   History of iron deficiency anemia   Pulmonary emboli ([place])    Consultations:  None  Subjective: Feels great no complaints.   Ambulating in the hallway saturating greater than 95% without any issues.  Family at bedside during my discharge instructions with her.  Discharge Exam: Vitals:   04/12/19 0745 04/12/19 0943  BP: (!) 137/49   Pulse: (!) 51   Resp: 16   Temp: 98.4 F (36.9 C)   SpO2: 99% 97%   Vitals:   04/12/19 0000 04/12/19 0034 04/12/19 0745 04/12/19 0943  BP: (!) 128/52 (!) 141/56 (!) 137/49   Pulse: (!) 52 (!) 53 (!) 51   Resp: 15 16 16    Temp:  98.1 F (36.7 C) 98.4 F (36.9 C)   TempSrc:  Oral Oral   SpO2: 98% 96% 99% 97%  Weight:      Height:        General: Pt is alert, awake, not in acute distress Cardiovascular: RRR, S1/S2 +, no rubs, no gallops Respiratory: CTA bilaterally, no wheezing, no rhonchi Abdominal: Soft, NT, ND, bowel sounds + Extremities: no edema, no cyanosis  Discharge Instructions  Discharge Instructions    Diet - low sodium heart healthy   Complete by: As directed    Increase activity slowly   Complete by: As directed      Allergies as of 04/12/2019      Reactions   Other Anaphylaxis, Cough   fragarances-perfumes   Dilaudid [hydromorphone Hcl] Nausea Only      Medication List    STOP taking these medications   doxycycline 100 MG tablet Commonly known as: VIBRA-TABS     TAKE these medications   Accu-Chek SmartView test strip Generic drug: glucose blood   acetaminophen 500 MG tablet Commonly known as: TYLENOL Take 500 mg by mouth  every 6 (six) hours as needed for fever or headache.   albuterol 108 (90 Base) MCG/ACT inhaler Commonly known as: VENTOLIN HFA Inhale 1-2 puffs into the lungs every 4 (four) hours as needed for shortness of breath or wheezing.   Align 4 MG Caps Take 4 mg by mouth daily.   ALPRAZolam 0.25 MG tablet Commonly known as: XANAX Take 0.125 mg by mouth at bedtime as needed for anxiety.   Apixaban Starter Pack 5 MG Tbpk Commonly known as: ELIQUIS STARTER PACK Take as directed on package: start with two-5mg  tablets twice  daily for 7 days. On day 8, switch to one-5mg  tablet twice daily.   aspirin 81 MG chewable tablet Chew 81 mg by mouth at bedtime.   cefdinir 300 MG capsule Commonly known as: OMNICEF Take 300 mg by mouth 2 (two) times daily.   cephALEXin 500 MG capsule Commonly known as: KEFLEX Take 1 capsule (500 mg total) by mouth 4 (four) times daily for 5 days.   escitalopram 5 MG tablet Commonly known as: LEXAPRO Take 5 mg by mouth every evening.   fluticasone 50 MCG/ACT nasal spray Commonly known as: FLONASE Place 2 sprays into both nostrils daily.   iron polysaccharides 150 MG capsule Commonly known as: NIFEREX Take 150 mg by mouth daily.   Levothyroxine Sodium 137 MCG Caps Take 137 mcg by mouth daily before breakfast.   metoprolol tartrate 25 MG tablet Commonly known as: LOPRESSOR Take 25 mg by mouth 2 (two) times daily.   multivitamin-iron-minerals-folic acid chewable tablet Chew 1 tablet by mouth daily.   niCARdipine 20 MG capsule Commonly known as: CARDENE Take 20 mg by mouth every evening.   nitroGLYCERIN 0.4 mg/hr patch Commonly known as: NITRODUR - Dosed in mg/24 hr APPLY 1 PATCH ONCE DAILY AS DIRECTED. What changed:   how much to take  how to take this  when to take this  additional instructions   pantoprazole 40 MG tablet Commonly known as: PROTONIX Take 1 tablet (40 mg total) by mouth daily at 12 noon. What changed: when to take this   Ranexa 500 MG 12 hr tablet Generic drug: ranolazine TAKE 1 TABLET EVERY 12 HOURS. What changed:   how much to take  when to take this   Systane 0.4-0.3 % Soln Generic drug: Polyethyl Glycol-Propyl Glycol Place 1 drop into both eyes See admin instructions. Once to twice daily as needed for dry eyes   Vitamin D3 Gummies 25 MCG (1000 UT) Chew Generic drug: Cholecalciferol Chew 2,000 Units by mouth daily.   Vytorin 10-20 MG tablet Generic drug: ezetimibe-simvastatin Take 1 tablet by mouth daily at 6 PM.       Follow-up Information    Burnard Bunting, MD. Schedule an appointment as soon as possible for a visit in 2 week(s).   Specialty: Internal Medicine Contact information: [place] [phone]          Allergies  Allergen Reactions  . Other Anaphylaxis and Cough    fragarances-perfumes  . Dilaudid [Hydromorphone Hcl] Nausea Only    You were cared for by a hospitalist during your hospital stay. If you have any questions about your discharge medications or the care you received while you were in the hospital after you are discharged, you can call the unit and asked to speak with the hospitalist on call if the hospitalist that took care of you is not available. Once you are discharged, your primary care physician will handle any further medical  issues. Please note that no refills for any discharge medications will be authorized once you are discharged, as it is imperative that you return to your primary care physician (or establish a relationship with a primary care physician if you do not have one) for your aftercare needs so that they can reassess your need for medications and monitor your lab values.   Procedures/Studies: CT Angio Chest PE W/Cm &/Or Wo Cm  Result Date: 04/11/2019 CLINICAL DATA:  Hypoxemia. Shortness of breath. EXAM: CT ANGIOGRAPHY CHEST WITH CONTRAST TECHNIQUE: Multidetector CT imaging of the chest was performed using the standard protocol during bolus administration of intravenous contrast. Multiplanar CT image reconstructions and MIPs were obtained to evaluate the vascular anatomy. CONTRAST:  68 mL OMNIPAQUE IOHEXOL 350 MG/ML SOLN COMPARISON:  Chest x-ray dated 03/12/2019 FINDINGS: Cardiovascular: There are 2 small peripheral pulmonary emboli in the right lower lobe. No other pulmonary emboli. Aortic atherosclerosis. RV LV ratio is normal. Mild cardiomegaly. No pericardial effusion. Mediastinum/Nodes: No hilar or mediastinal adenopathy.  Esophagus is normal. Thyroid gland is not identified. Lungs/Pleura: No infiltrates or effusions. There is several small blebs in the right lower lobe. Minimal atelectasis in the lingula. Upper Abdomen: Negative. Musculoskeletal: No chest wall abnormality. No acute or significant osseous findings. Review of the MIP images confirms the above findings. IMPRESSION: Two small peripheral pulmonary emboli in the right lower lobe. Mild cardiomegaly. Electronically Signed   By: Lorriane Shire M.D.   On: 04/11/2019 16:14   DG Chest Port 1 View  Result Date: 04/11/2019 CLINICAL DATA:  Shortness of breath. EXAM: PORTABLE CHEST 1 VIEW COMPARISON:  March 12, 2018. FINDINGS: The heart size and mediastinal contours are within normal limits. Both lungs are clear. Atherosclerosis of thoracic aorta is noted. No pneumothorax or pleural effusion is noted. The visualized skeletal structures are unremarkable. IMPRESSION: No active disease. Aortic Atherosclerosis (ICD10-I70.0). Electronically Signed   By: Marijo Conception M.D.   On: 04/11/2019 12:42   PCV CAROTID DUPLEX (BILATERAL)  Result Date: 03/27/2019 Carotid artery duplex  03/23/2019: Stenosis in the right internal carotid artery (16-49%). Peak systolic velocities in the left bifurcation, internal, external and common carotid arteries are within normal limits. Antegrade right vertebral artery flow. Antegrade left vertebral artery flow. Follow up in one year is appropriate if clinically indicated. Compared to 10/05/14, right ICA stenosis new.      The results of significant diagnostics from this hospitalization (including imaging, microbiology, ancillary and laboratory) are listed below for reference.     Microbiology: Recent Results (from the past 240 hour(s))  Culture, blood (routine x 2)     Status: None (Preliminary result)   Collection Time: 04/11/19  1:21 PM   Specimen: BLOOD  Result Value Ref Range Status   Specimen Description BLOOD LEFT ANTECUBITAL  Final    Special Requests   Final    BOTTLES DRAWN AEROBIC AND ANAEROBIC Blood Culture adequate volume Performed at [facility], [address] [place], [place] [address]    Culture NO GROWTH < 24 HOURS  Final   Report Status PENDING  Incomplete  Culture, blood (routine x 2)     Status: None (Preliminary result)   Collection Time: 04/11/19  1:21 PM   Specimen: BLOOD  Result Value Ref Range Status   Specimen Description BLOOD RIGHT ANTECUBITAL  Final   Special Requests   Final    BOTTLES DRAWN AEROBIC AND ANAEROBIC Blood Culture adequate volume Performed at [facility], [place] [address]  [place], [place] [address]    Culture NO GROWTH < 24 HOURS  Final   Report Status PENDING  Incomplete  Respiratory Panel by RT PCR (Flu A&B, Covid) - Nasopharyngeal Swab     Status: None   Collection Time: 04/11/19  4:43 PM   Specimen: Nasopharyngeal Swab  Result Value Ref Range Status   SARS Coronavirus 2 by RT PCR NEGATIVE NEGATIVE Final    Comment: (NOTE) SARS-CoV-2 target nucleic acids are NOT DETECTED. The SARS-CoV-2 RNA is generally detectable in upper respiratoy specimens during the acute phase of infection. The lowest concentration of SARS-CoV-2 viral copies this assay can detect is 131 copies/mL. A negative result does not preclude SARS-Cov-2 infection and should not be used as the sole basis for treatment or other patient management decisions. A negative result may occur with  improper specimen collection/handling, submission of specimen other than nasopharyngeal swab, presence of viral mutation(s) within the areas targeted by this assay, and inadequate number of viral copies (<131 copies/mL). A negative result must be combined with clinical observations, patient history, and epidemiological information. The expected result is Negative. Fact Sheet for Patients:  PinkCheek.be Fact Sheet for Healthcare Providers:   GravelBags.it This test is not yet ap proved or cleared by the [place] FDA and  has been authorized for detection and/or diagnosis of SARS-CoV-2 by FDA under an Emergency Use Authorization (EUA). This EUA will remain  in effect (meaning this test can be used) for the duration of the COVID-19 declaration under Section 564(b)(1) of the Act, 21 U.S.C. section 360bbb-3(b)(1), unless the authorization is terminated or revoked sooner.    Influenza A by PCR NEGATIVE NEGATIVE Final   Influenza B by PCR NEGATIVE NEGATIVE Final    Comment: (NOTE) The Xpert Xpress SARS-CoV-2/FLU/RSV assay is intended as an aid in  the diagnosis of influenza from Nasopharyngeal swab specimens and  should not be used as a sole basis for treatment. Nasal washings and  aspirates are unacceptable for Xpert Xpress SARS-CoV-2/FLU/RSV  testing. Fact Sheet for Patients: PinkCheek.be Fact Sheet for Healthcare Providers: GravelBags.it This test is not yet approved or cleared by the [place] FDA and  has been authorized for detection and/or diagnosis of SARS-CoV-2 by  FDA under an Emergency Use Authorization (EUA). This EUA will remain  in effect (meaning this test can be used) for the duration of the  Covid-19 declaration under Section 564(b)(1) of the Act, 21  U.S.C. section 360bbb-3(b)(1), unless the authorization is  terminated or revoked. Performed at [facility], [place] [address] [place], [place] [address]      Labs: BNP (last 3 results) Recent Labs    04/11/19 1247  BNP 123456   Basic Metabolic Panel: Recent Labs  Lab 04/11/19 1247 04/12/19 0317  NA 139 137  K 4.0 3.7  CL 106 106  CO2 23 23  GLUCOSE 113* 106*  BUN 22 17  CREATININE 0.80 0.78  CALCIUM 9.0 8.7*   Liver Function Tests: No results for input(s): AST, ALT, ALKPHOS, BILITOT, PROT, ALBUMIN in the last 168 hours. No results for  input(s): LIPASE, AMYLASE in the last 168 hours. No results for input(s): AMMONIA in the last 168 hours. CBC: Recent Labs  Lab 04/11/19 1247 04/12/19 0317  WBC 7.6 9.6  NEUTROABS 3.9  --   HGB 12.2 11.5*  HCT 37.4 34.9*  MCV 96.6 95.9  PLT 197 173   Cardiac Enzymes: No results for input(s): CKTOTAL, CKMB, CKMBINDEX, TROPONINI in the last 168 hours.  BNP: Invalid input(s): POCBNP CBG: No results for input(s): GLUCAP in the last 168 hours. D-Dimer No results for input(s): DDIMER in the last 72 hours. Hgb A1c No results for input(s): HGBA1C in the last 72 hours. Lipid Profile No results for input(s): CHOL, HDL, LDLCALC, TRIG, CHOLHDL, LDLDIRECT in the last 72 hours. Thyroid function studies No results for input(s): TSH, T4TOTAL, T3FREE, THYROIDAB in the last 72 hours.  Invalid input(s): FREET3 Anemia work up No results for input(s): VITAMINB12, FOLATE, FERRITIN, TIBC, IRON, RETICCTPCT in the last 72 hours. Urinalysis    Component Value Date/Time   COLORURINE YELLOW 06/18/2013 1625   APPEARANCEUR CLEAR 06/18/2013 1625   LABSPEC 1.013 06/18/2013 1625   PHURINE 7.0 06/18/2013 1625   GLUCOSEU NEGATIVE 06/18/2013 1625   HGBUR NEGATIVE 06/18/2013 1625   BILIRUBINUR NEGATIVE 06/18/2013 1625   KETONESUR NEGATIVE 06/18/2013 1625   PROTEINUR NEGATIVE 06/18/2013 1625   UROBILINOGEN 0.2 06/18/2013 1625   NITRITE NEGATIVE 06/18/2013 1625   LEUKOCYTESUR TRACE (A) 06/18/2013 1625   Sepsis Labs Invalid input(s): PROCALCITONIN,  WBC,  LACTICIDVEN Microbiology Recent Results (from the past 240 hour(s))  Culture, blood (routine x 2)     Status: None (Preliminary result)   Collection Time: 04/11/19  1:21 PM   Specimen: BLOOD  Result Value Ref Range Status   Specimen Description BLOOD LEFT ANTECUBITAL  Final   Special Requests   Final    BOTTLES DRAWN AEROBIC AND ANAEROBIC Blood Culture adequate volume Performed at [facility], [place] [address] [place], [place] [address]     Culture NO GROWTH < 24 HOURS  Final   Report Status PENDING  Incomplete  Culture, blood (routine x 2)     Status: None (Preliminary result)   Collection Time: 04/11/19  1:21 PM   Specimen: BLOOD  Result Value Ref Range Status   Specimen Description BLOOD RIGHT ANTECUBITAL  Final   Special Requests   Final    BOTTLES DRAWN AEROBIC AND ANAEROBIC Blood Culture adequate volume Performed at [facility], [place] [address], [place], [place] [address]    Culture NO GROWTH < 24 HOURS  Final   Report Status PENDING  Incomplete  Respiratory Panel by RT PCR (Flu A&B, Covid) - Nasopharyngeal Swab     Status: None   Collection Time: 04/11/19  4:43 PM   Specimen: Nasopharyngeal Swab  Result Value Ref Range Status   SARS Coronavirus 2 by RT PCR NEGATIVE NEGATIVE Final    Comment: (NOTE) SARS-CoV-2 target nucleic acids are NOT DETECTED. The SARS-CoV-2 RNA is generally detectable in upper respiratoy specimens during the acute phase of infection. The lowest concentration of SARS-CoV-2 viral copies this assay can detect is 131 copies/mL. A negative result does not preclude SARS-Cov-2 infection and should not be used as the sole basis for treatment or other patient management decisions. A negative result may occur with  improper specimen collection/handling, submission of specimen other than nasopharyngeal swab, presence of viral mutation(s) within the areas targeted by this assay, and inadequate number of viral copies (<131 copies/mL). A negative result must be combined with clinical observations, patient history, and epidemiological information. The expected result is Negative. Fact Sheet for Patients:  PinkCheek.be Fact Sheet for Healthcare Providers:  GravelBags.it This test is not yet ap proved or cleared by the [place] FDA and  has been authorized for detection and/or diagnosis of SARS-CoV-2 by FDA under an Emergency Use  Authorization (EUA). This EUA will remain  in effect (meaning this  test can be used) for the duration of the COVID-19 declaration under Section 564(b)(1) of the Act, 21 U.S.C. section 360bbb-3(b)(1), unless the authorization is terminated or revoked sooner.    Influenza A by PCR NEGATIVE NEGATIVE Final   Influenza B by PCR NEGATIVE NEGATIVE Final    Comment: (NOTE) The Xpert Xpress SARS-CoV-2/FLU/RSV assay is intended as an aid in  the diagnosis of influenza from Nasopharyngeal swab specimens and  should not be used as a sole basis for treatment. Nasal washings and  aspirates are unacceptable for Xpert Xpress SARS-CoV-2/FLU/RSV  testing. Fact Sheet for Patients: PinkCheek.be Fact Sheet for Healthcare Providers: GravelBags.it This test is not yet approved or cleared by the [place] FDA and  has been authorized for detection and/or diagnosis of SARS-CoV-2 by  FDA under an Emergency Use Authorization (EUA). This EUA will remain  in effect (meaning this test can be used) for the duration of the  Covid-19 declaration under Section 564(b)(1) of the Act, 21  U.S.C. section 360bbb-3(b)(1), unless the authorization is  terminated or revoked. Performed at [facility], [place] [address] [place], [place] [address]      Time coordinating discharge:  I have spent 35 minutes face to face with the patient and on the ward discussing the patients care, assessment, plan and disposition with other care givers. >50% of the time was devoted counseling the patient about the risks and benefits of treatment/Discharge disposition and coordinating care.   SIGNED:   Damita Lack, MD  [facility] 04/12/2019, 11:51 AM   If 7PM-7AM, please contact night-coverage

## 2019-04-12 NOTE — Care Management (Signed)
Pt discharged prior to [facility] assessment.  CM reviewed pts chart - no TOC needs determined.  TOC delivered pts discharge meds to pt and informed of ongoing copay with Eliquis - no PA required.  CM signing off

## 2019-04-12 NOTE — Evaluation (Signed)
Physical Therapy Evaluation Patient Details Name: Desiree Leon MRN: [id number] DOB: Jul 06, 1940 Today's Date: 04/12/2019   History of Present Illness  Patient is a 79 year old female admitted with SOB, found to have PE. PMH includes NSTEMI, COPD, DM, hypothyroidism, GERD, home O2 at night.  Clinical Impression  Patient received in bed, pleasant, agrees to PT evaluation. Patient reports she is feeling better, wants to get home to her dogs. O2 sats at rest on room air 98%. She is independent with bed mobility, transfers with supervision,  ambulated without AD 200 feet with min guard. O2 sats while walking at 96% or above on room air. She reports some sob with mobility. She is generally safe with mobility. Patient does not require further PT intervention at this time and can return home with family assist.       Follow Up Recommendations No PT follow up    Equipment Recommendations  None recommended by PT    Recommendations for Other Services       Precautions / Restrictions Precautions Precautions: Fall Restrictions Weight Bearing Restrictions: No      Mobility  Bed Mobility Overal bed mobility: Independent                Transfers Overall transfer level: Independent                  Ambulation/Gait Ambulation/Gait assistance: Min guard Gait Distance (Feet): 200 Feet Assistive device: None Gait Pattern/deviations: Step-through pattern;Decreased stride length Gait velocity: decr   General Gait Details: generally safe with mobility  Stairs            Wheelchair Mobility    Modified Rankin (Stroke Patients Only)       Balance Overall balance assessment: Mild deficits observed, not formally tested                                           Pertinent Vitals/Pain Pain Assessment: No/denies pain    Home Living Family/patient expects to be discharged to:: Private residence Living Arrangements: Alone Available Help at Discharge:  Family Type of Home: House Home Access: Ramped entrance     Home Layout: One level Home Equipment: None      Prior Function Level of Independence: Independent               Hand Dominance        Extremity/Trunk Assessment   Upper Extremity Assessment Upper Extremity Assessment: Overall WFL for tasks assessed    Lower Extremity Assessment Lower Extremity Assessment: Overall WFL for tasks assessed    Cervical / Trunk Assessment Cervical / Trunk Assessment: Normal  Communication   Communication: No difficulties  Cognition Arousal/Alertness: Awake/alert Behavior During Therapy: WFL for tasks assessed/performed Overall Cognitive Status: Within Functional Limits for tasks assessed                                        General Comments      Exercises     Assessment/Plan    PT Assessment Patent does not need any further PT services  PT Problem List Decreased strength;Decreased mobility;Decreased activity tolerance       PT Treatment Interventions Gait training    PT Goals (Current goals can be found in the Care Plan section)  Acute  Rehab PT Goals Patient Stated Goal: to return home to my dogs. PT Goal Formulation: With patient Time For Goal Achievement: 04/15/19 Potential to Achieve Goals: Good    Frequency     Barriers to discharge        Co-evaluation               AM-PAC PT "6 Clicks" Mobility  Outcome Measure Help needed turning from your back to your side while in a flat bed without using bedrails?: None Help needed moving from lying on your back to sitting on the side of a flat bed without using bedrails?: None Help needed moving to and from a bed to a chair (including a wheelchair)?: None Help needed standing up from a chair using your arms (e.g., wheelchair or bedside chair)?: None Help needed to walk in hospital room?: None Help needed climbing 3-5 steps with a railing? : A Little 6 Click Score: 23    End of  Session Equipment Utilized During Treatment: Gait belt Activity Tolerance: Patient tolerated treatment well Patient left: in bed;with call bell/phone within reach Nurse Communication: Mobility status PT Visit Diagnosis: Muscle weakness (generalized) (M62.81);Difficulty in walking, not elsewhere classified (R26.2)    Time: [id number] PT Time Calculation (min) (ACUTE ONLY): 18 min   Charges:   PT Evaluation $PT Eval Moderate Complexity: 1 Mod PT Treatments $Gait Training: 8-22 mins        Dontel Harshberger, PT, GCS 04/12/19,9:53 AM

## 2019-04-12 NOTE — Evaluation (Signed)
Occupational Therapy Evaluation Patient Details Name: Desiree Leon MRN: [id number] DOB: Jul 12, 1940 Today's Date: 04/12/2019    History of Present Illness Patient is a 79 year old female admitted with SOB, found to have PE. PMH includes NSTEMI, COPD, DM, hypothyroidism, GERD, home O2 at night.   Clinical Impression   Pt is at Independent - Mod I baseline level of function. Pt lives at home alone. All education completed and no further acute OT is indicated at this time, OT will sign off    Follow Up Recommendations  No OT follow up    Equipment Recommendations  None recommended by OT    Recommendations for Other Services       Precautions / Restrictions Precautions Precautions: Fall Restrictions Weight Bearing Restrictions: No      Mobility Bed Mobility Overal bed mobility: Independent                Transfers Overall transfer level: Independent                    Balance Overall balance assessment: Mild deficits observed, not formally tested                                         ADL either performed or assessed with clinical judgement   ADL Overall ADL's : Independent;Modified independent;At baseline                                             Vision Patient Visual Report: No change from baseline       Perception     Praxis      Pertinent Vitals/Pain Pain Assessment: No/denies pain     Hand Dominance Right   Extremity/Trunk Assessment Upper Extremity Assessment Upper Extremity Assessment: Overall WFL for tasks assessed   Lower Extremity Assessment Lower Extremity Assessment: Defer to PT evaluation   Cervical / Trunk Assessment Cervical / Trunk Assessment: Normal   Communication Communication Communication: No difficulties   Cognition Arousal/Alertness: Awake/alert Behavior During Therapy: WFL for tasks assessed/performed Overall Cognitive Status: Within Functional Limits for tasks  assessed                                     General Comments       Exercises     Shoulder Instructions      Home Living Family/patient expects to be discharged to:: Private residence Living Arrangements: Alone Available Help at Discharge: Family Type of Home: House Home Access: Ramped entrance     Home Layout: One level     Bathroom Shower/Tub: Teacher, early years/pre: Standard     Home Equipment: None          Prior Functioning/Environment Level of Independence: Independent                 OT Problem List:        OT Treatment/Interventions:      OT Goals(Current goals can be found in the care plan section) Acute Rehab OT Goals Patient Stated Goal: to return home to my dogs. OT Goal Formulation: With patient  OT Frequency:     Barriers to D/C:    no  barriers       Co-evaluation              AM-PAC OT "6 Clicks" Daily Activity     Outcome Measure Help from another person eating meals?: None Help from another person taking care of personal grooming?: None Help from another person toileting, which includes using toliet, bedpan, or urinal?: None Help from another person bathing (including washing, rinsing, drying)?: None Help from another person to put on and taking off regular upper body clothing?: None Help from another person to put on and taking off regular lower body clothing?: None 6 Click Score: 24   End of Session    Activity Tolerance: Patient tolerated treatment well Patient left: in bed;with family/visitor present  OT Visit Diagnosis: Muscle weakness (generalized) (M62.81)                Time: [id number] OT Time Calculation (min): 19 min Charges:  OT General Charges $OT Visit: 1 Visit   Britt Bottom 04/12/2019, 1:05 PM

## 2019-04-12 NOTE — TOC Benefit Eligibility Note (Signed)
Transition of Care [facility]) Benefit Eligibility Note    Patient Details  Name: Desiree Leon MRN: [id number] Date of Birth: 10/06/1940   Medication/Dose: Arne Cleveland  2.5 MG  BID   and ELIQUIS  5 MG BID  Covered?: Yes  Tier: 2 Drug  Prescription Coverage Preferred Pharmacy: [place] with Person/Company/Phone Number:: [employer]  @ [employer] # [phone]  Co-Pay: $40.00  Prior Approval: No     Additional Notes: ELIQUIS 10 MG BID : Crecencio Mc Phone Number: 04/12/2019, 1:03 PM

## 2019-04-12 NOTE — Care Management Obs Status (Signed)
[place] NOTIFICATION   Patient Details  Name: Desiree Leon MRN: [id number] Date of Birth: [birth year]   Medicare Observation Status Notification Given:  Yes    Benard Halsted, LCSW 04/12/2019, 10:55 AM

## 2019-04-12 NOTE — Care Management CC44 (Signed)
Condition Code 44 Documentation Completed  Patient Details  Name: DONNELL HARSHA MRN: [id number] Date of Birth: April 15, 1940   Condition Code 44 given:  Yes Patient signature on Condition Code 44 notice:  Yes Documentation of 2 MD's agreement:  Yes Code 44 added to claim:  Yes    Benard Halsted, LCSW 04/12/2019, 10:55 AM

## 2019-04-16 LAB — CULTURE, BLOOD (ROUTINE X 2)
Culture: NO GROWTH
Culture: NO GROWTH
Special Requests: ADEQUATE
Special Requests: ADEQUATE

## 2019-04-18 ENCOUNTER — Other Ambulatory Visit: Payer: Self-pay | Admitting: Cardiology

## 2019-04-18 ENCOUNTER — Telehealth: Payer: Self-pay

## 2019-04-18 DIAGNOSIS — I209 Angina pectoris, unspecified: Secondary | ICD-10-CM

## 2019-04-18 NOTE — Telephone Encounter (Signed)
I reviewed her chart. Tell her to stop aspirin until she is on blood thinners

## 2019-04-18 NOTE — Telephone Encounter (Signed)
Patient is already on blood thinners, she wants to know if she will need to restart aspirin? Please advise.CM

## 2019-04-18 NOTE — Telephone Encounter (Signed)
STOP ASA UNTIL SHE IS DONE WITH  HER BLOOD THINNERS

## 2019-04-19 NOTE — Telephone Encounter (Signed)
Patient aware.

## 2019-04-20 ENCOUNTER — Other Ambulatory Visit: Payer: Self-pay

## 2019-04-20 MED ORDER — RANOLAZINE ER 500 MG PO TB12: 500.0000 mg | ORAL_TABLET | Freq: Two times a day (BID) | ORAL | 3 refills | Status: DC

## 2019-04-21 DIAGNOSIS — I6529 Occlusion and stenosis of unspecified carotid artery: Secondary | ICD-10-CM | POA: Insufficient documentation

## 2019-04-21 DIAGNOSIS — K219 Gastro-esophageal reflux disease without esophagitis: Secondary | ICD-10-CM | POA: Insufficient documentation

## 2019-05-17 ENCOUNTER — Other Ambulatory Visit: Payer: Self-pay | Admitting: Cardiology

## 2019-05-17 DIAGNOSIS — I209 Angina pectoris, unspecified: Secondary | ICD-10-CM

## 2019-06-13 ENCOUNTER — Other Ambulatory Visit: Payer: Self-pay | Admitting: Gastroenterology

## 2019-06-13 DIAGNOSIS — R195 Other fecal abnormalities: Secondary | ICD-10-CM

## 2019-06-30 ENCOUNTER — Ambulatory Visit
Admission: RE | Admit: 2019-06-30 | Discharge: 2019-06-30 | Disposition: A | Discharge status: Home / Self Care | Payer: Medicare PPO | Source: Ambulatory Visit | Attending: Gastroenterology | Admitting: Gastroenterology

## 2019-06-30 ENCOUNTER — Other Ambulatory Visit: Payer: Self-pay

## 2019-06-30 DIAGNOSIS — R195 Other fecal abnormalities: Secondary | ICD-10-CM

## 2019-08-01 DIAGNOSIS — D1801 Hemangioma of skin and subcutaneous tissue: Secondary | ICD-10-CM | POA: Diagnosis not present

## 2019-08-01 DIAGNOSIS — D485 Neoplasm of uncertain behavior of skin: Secondary | ICD-10-CM | POA: Diagnosis not present

## 2019-08-01 DIAGNOSIS — Z85828 Personal history of other malignant neoplasm of skin: Secondary | ICD-10-CM | POA: Diagnosis not present

## 2019-08-01 DIAGNOSIS — L821 Other seborrheic keratosis: Secondary | ICD-10-CM | POA: Diagnosis not present

## 2019-08-01 DIAGNOSIS — D225 Melanocytic nevi of trunk: Secondary | ICD-10-CM | POA: Diagnosis not present

## 2019-08-01 DIAGNOSIS — D0461 Carcinoma in situ of skin of right upper limb, including shoulder: Secondary | ICD-10-CM | POA: Diagnosis not present

## 2019-08-01 DIAGNOSIS — L814 Other melanin hyperpigmentation: Secondary | ICD-10-CM | POA: Diagnosis not present

## 2019-08-01 DIAGNOSIS — L820 Inflamed seborrheic keratosis: Secondary | ICD-10-CM | POA: Diagnosis not present

## 2019-09-12 ENCOUNTER — Encounter (HOSPITAL_COMMUNITY): Payer: Self-pay | Admitting: Emergency Medicine

## 2019-09-12 ENCOUNTER — Ambulatory Visit (HOSPITAL_COMMUNITY)
Admission: EM | Admit: 2019-09-12 | Discharge: 2019-09-12 | Disposition: A | Discharge status: Home / Self Care | Payer: Medicare PPO | Attending: Family Medicine | Admitting: Family Medicine

## 2019-09-12 ENCOUNTER — Other Ambulatory Visit: Payer: Self-pay

## 2019-09-12 ENCOUNTER — Ambulatory Visit (INDEPENDENT_AMBULATORY_CARE_PROVIDER_SITE_OTHER): Payer: Medicare PPO

## 2019-09-12 DIAGNOSIS — R0602 Shortness of breath: Secondary | ICD-10-CM

## 2019-09-12 DIAGNOSIS — R05 Cough: Secondary | ICD-10-CM | POA: Diagnosis not present

## 2019-09-12 DIAGNOSIS — J9811 Atelectasis: Secondary | ICD-10-CM | POA: Diagnosis not present

## 2019-09-12 DIAGNOSIS — R059 Cough, unspecified: Secondary | ICD-10-CM

## 2019-09-12 MED ORDER — DOXYCYCLINE HYCLATE 100 MG PO CAPS: 100.0000 mg | ORAL_CAPSULE | Freq: Two times a day (BID) | ORAL | 0 refills | Status: DC

## 2019-09-12 NOTE — ED Triage Notes (Signed)
Pt c/o of cough and SOB on set approx 1 week ago. Symptoms worsened over the last few days. Pt coughing up yellow mucous beginning today. C/o chest burning with cough. Pt denies fevers and chills.  Pt has not been tested for covid in the last 14 days. Pt in fully vaccinated, pfizer end of march and beginning of April.

## 2019-09-26 DIAGNOSIS — D638 Anemia in other chronic diseases classified elsewhere: Secondary | ICD-10-CM | POA: Diagnosis not present

## 2019-09-26 DIAGNOSIS — I10 Essential (primary) hypertension: Secondary | ICD-10-CM | POA: Diagnosis not present

## 2019-09-26 DIAGNOSIS — M545 Low back pain, unspecified: Secondary | ICD-10-CM | POA: Insufficient documentation

## 2019-09-26 DIAGNOSIS — R7989 Other specified abnormal findings of blood chemistry: Secondary | ICD-10-CM | POA: Diagnosis not present

## 2019-09-26 DIAGNOSIS — E1129 Type 2 diabetes mellitus with other diabetic kidney complication: Secondary | ICD-10-CM | POA: Diagnosis not present

## 2019-09-26 DIAGNOSIS — J181 Lobar pneumonia, unspecified organism: Secondary | ICD-10-CM | POA: Diagnosis not present

## 2019-09-26 DIAGNOSIS — J449 Chronic obstructive pulmonary disease, unspecified: Secondary | ICD-10-CM | POA: Diagnosis not present

## 2019-09-26 DIAGNOSIS — D649 Anemia, unspecified: Secondary | ICD-10-CM | POA: Diagnosis not present

## 2019-09-26 DIAGNOSIS — Z9981 Dependence on supplemental oxygen: Secondary | ICD-10-CM | POA: Diagnosis not present

## 2019-09-26 DIAGNOSIS — R05 Cough: Secondary | ICD-10-CM | POA: Diagnosis not present

## 2019-09-26 NOTE — ED Provider Notes (Signed)
[place]   [id number] 09/12/19 Arrival Time: 2831  ASSESSMENT & PLAN:  1. Cough      Will treat empirically for pneumonia given presentation today.  Meds ordered this encounter  Medications  . doxycycline (VIBRAMYCIN) 100 MG capsule    Sig: Take 1 capsule (100 mg total) by mouth 2 (two) times daily.    Dispense:  20 capsule    Refill:  0    Recommend:  Follow-up Information    Burnard Bunting, MD. Schedule an appointment as soon as possible for a visit in 1 week.   Specialty: Internal Medicine Contact information: [address] [place] [address] [phone]                Reviewed expectations re: course of current medical issues. Questions answered. Outlined signs and symptoms indicating need for more acute intervention. Patient verbalized understanding. After Visit Summary given.   SUBJECTIVE: History from: patient.  Desiree Leon is a 79 y.o. female who presents with complains of cough and occasional feeling SOB. Gradual onset approx 1 week ago. Symptoms worsened over the last few days. Pt coughing up yellow mucous beginning today. Reports chest burning with cough; no specific CP. Pt denies fevers and chills. No current SOB.  Pt has not been tested for covid in the last 14 days. Pt in fully vaccinated, pfizer end of march and beginning of April.    Social History   Tobacco Use  Smoking Status Former Smoker  . Quit date: 03/10/2005  . Years since quitting: 14.5  Smokeless Tobacco Never Used    OBJECTIVE:  Vitals:   09/12/19 0908  BP: (!) 122/59  Pulse: 62  Resp: 18  Temp: 98.6 F (37 C)  TempSrc: Oral  SpO2: 98%     General appearance: alert; appears fatigued HEENT: nasal congestion; clear runny nose; throat irritation secondary to post-nasal drainage Neck: supple without LAD CV: reg Lungs: unlabored respirations, symmetrical air entry without wheezing; cough: moderate and dry Abd: soft Ext: no LE edema Skin:  warm and dry Psychological: alert and cooperative; normal mood and affect  Imaging: DG Chest 2 View  Result Date: 09/12/2019 CLINICAL DATA:  Shortness of breath and cough for 1 week. EXAM: CHEST - 2 VIEW COMPARISON:  April 11, 2019 FINDINGS: The heart size and mediastinal contours are stable. Minimal atelectasis of both lung bases are noted. Both lungs are clear. The visualized skeletal structures are stable. IMPRESSION: Minimal atelectasis of both lung bases.  No focal pneumonia noted. Electronically Signed   By: Abelardo Diesel M.D.   On: 09/12/2019 10:20    Allergies  Allergen Reactions  . Other Anaphylaxis and Cough    fragarances-perfumes  . Dilaudid [Hydromorphone Hcl] Nausea Only    Past Medical History:  Diagnosis Date  . Anxiety   . Blood transfusion   . COPD (chronic obstructive pulmonary disease) ([place])   . Heart murmur   . History of colon polyps   . Hyperlipidemia   . Hypertension   . MI (myocardial infarction) ([place])   . Mitral valve prolapse   . NSTEMI (non-ST elevated myocardial infarction) ([place])   . Osteoarthritis   . Osteoporosis   . Pulmonary embolism ([place]) 2020  . Spinal stenosis   . Thyroid disease    hypothyroidism  . Unstable angina ([place]) 05/08/2014  . Venous insufficiency    Family History  Problem Relation Age of Onset  . Heart disease Mother   . Heart disease Father   .  Cancer Sister        breast  . Cancer Daughter        cervical and bone  . Heart disease Sister   . Thyroid disease Sister    Social History   Socioeconomic History  . Marital status: Widowed    Spouse name: Not on file  . Number of children: 3  . Years of education: Not on file  . Highest education level: Not on file  Occupational History  . Not on file  Tobacco Use  . Smoking status: Former Smoker    Quit date: 03/10/2005    Years since quitting: 14.5  . Smokeless tobacco: Never Used  Vaping Use  . Vaping Use: Never used  Substance and Sexual Activity  . Alcohol  use: No  . Drug use: No  . Sexual activity: Not on file  Other Topics Concern  . Not on file  Social History Narrative  . Not on file   Social Determinants of Health   Financial Resource Strain:   . Difficulty of Paying Living Expenses:   Food Insecurity:   . Worried About Charity fundraiser in the Last Year:   . Arboriculturist in the Last Year:   Transportation Needs:   . Film/video editor (Medical):   Marland Kitchen Lack of Transportation (Non-Medical):   Physical Activity:   . Days of Exercise per Week:   . Minutes of Exercise per Session:   Stress:   . Feeling of Stress :   Social Connections:   . Frequency of Communication with Friends and Family:   . Frequency of Social Gatherings with Friends and Family:   . Attends Religious Services:   . Active Member of Clubs or Organizations:   . Attends Archivist Meetings:   Marland Kitchen Marital Status:   Intimate Partner Violence:   . Fear of Current or Ex-Partner:   . Emotionally Abused:   Marland Kitchen Physically Abused:   . Sexually Abused:            Vanessa Kick, MD 09/26/19 [phone]

## 2019-10-19 DIAGNOSIS — D638 Anemia in other chronic diseases classified elsewhere: Secondary | ICD-10-CM | POA: Diagnosis not present

## 2019-10-19 DIAGNOSIS — E1159 Type 2 diabetes mellitus with other circulatory complications: Secondary | ICD-10-CM | POA: Diagnosis not present

## 2019-10-19 DIAGNOSIS — R7989 Other specified abnormal findings of blood chemistry: Secondary | ICD-10-CM | POA: Diagnosis not present

## 2019-10-19 DIAGNOSIS — I10 Essential (primary) hypertension: Secondary | ICD-10-CM | POA: Diagnosis not present

## 2019-10-19 DIAGNOSIS — E663 Overweight: Secondary | ICD-10-CM | POA: Diagnosis not present

## 2019-10-20 ENCOUNTER — Other Ambulatory Visit (HOSPITAL_COMMUNITY): Payer: Self-pay | Admitting: *Deleted

## 2019-10-21 ENCOUNTER — Encounter (HOSPITAL_COMMUNITY)
Admission: RE | Admit: 2019-10-21 | Discharge: 2019-10-21 | Disposition: A | Discharge status: Home / Self Care | Payer: Medicare PPO | Source: Ambulatory Visit | Attending: Internal Medicine | Admitting: Internal Medicine

## 2019-10-21 ENCOUNTER — Other Ambulatory Visit: Payer: Self-pay

## 2019-10-21 DIAGNOSIS — D649 Anemia, unspecified: Secondary | ICD-10-CM | POA: Insufficient documentation

## 2019-10-21 MED ORDER — SODIUM CHLORIDE 0.9 % IV SOLN
510.0000 mg | INTRAVENOUS | Status: DC
  Administered 2019-10-21: 510 mg via INTRAVENOUS
  Filled 2019-10-21: qty 17

## 2019-10-28 ENCOUNTER — Other Ambulatory Visit: Payer: Self-pay

## 2019-10-28 ENCOUNTER — Encounter (HOSPITAL_COMMUNITY)
Admission: RE | Admit: 2019-10-28 | Discharge: 2019-10-28 | Disposition: A | Discharge status: Home / Self Care | Payer: Medicare PPO | Source: Ambulatory Visit | Attending: Internal Medicine | Admitting: Internal Medicine

## 2019-10-28 DIAGNOSIS — D649 Anemia, unspecified: Secondary | ICD-10-CM | POA: Diagnosis not present

## 2019-10-28 MED ORDER — SODIUM CHLORIDE 0.9 % IV SOLN
510.0000 mg | INTRAVENOUS | Status: DC
  Administered 2019-10-28: 510 mg via INTRAVENOUS
  Filled 2019-10-28: qty 17

## 2019-11-09 DIAGNOSIS — L821 Other seborrheic keratosis: Secondary | ICD-10-CM | POA: Diagnosis not present

## 2019-11-09 DIAGNOSIS — Z85828 Personal history of other malignant neoplasm of skin: Secondary | ICD-10-CM | POA: Diagnosis not present

## 2019-11-09 DIAGNOSIS — L570 Actinic keratosis: Secondary | ICD-10-CM | POA: Diagnosis not present

## 2019-12-13 ENCOUNTER — Other Ambulatory Visit: Payer: Self-pay | Admitting: Cardiology

## 2019-12-13 DIAGNOSIS — I209 Angina pectoris, unspecified: Secondary | ICD-10-CM

## 2020-01-12 DIAGNOSIS — H35412 Lattice degeneration of retina, left eye: Secondary | ICD-10-CM | POA: Diagnosis not present

## 2020-01-12 DIAGNOSIS — H26491 Other secondary cataract, right eye: Secondary | ICD-10-CM | POA: Diagnosis not present

## 2020-01-12 DIAGNOSIS — E119 Type 2 diabetes mellitus without complications: Secondary | ICD-10-CM | POA: Diagnosis not present

## 2020-01-12 DIAGNOSIS — H04123 Dry eye syndrome of bilateral lacrimal glands: Secondary | ICD-10-CM | POA: Diagnosis not present

## 2020-01-12 DIAGNOSIS — H35033 Hypertensive retinopathy, bilateral: Secondary | ICD-10-CM | POA: Diagnosis not present

## 2020-01-13 ENCOUNTER — Other Ambulatory Visit: Payer: Self-pay | Admitting: Cardiology

## 2020-01-13 DIAGNOSIS — I209 Angina pectoris, unspecified: Secondary | ICD-10-CM

## 2020-01-17 DIAGNOSIS — H26491 Other secondary cataract, right eye: Secondary | ICD-10-CM | POA: Diagnosis not present

## 2020-02-01 DIAGNOSIS — I130 Hypertensive heart and chronic kidney disease with heart failure and stage 1 through stage 4 chronic kidney disease, or unspecified chronic kidney disease: Secondary | ICD-10-CM | POA: Diagnosis not present

## 2020-02-01 DIAGNOSIS — E663 Overweight: Secondary | ICD-10-CM | POA: Diagnosis not present

## 2020-02-01 DIAGNOSIS — F418 Other specified anxiety disorders: Secondary | ICD-10-CM | POA: Diagnosis not present

## 2020-02-01 DIAGNOSIS — E1129 Type 2 diabetes mellitus with other diabetic kidney complication: Secondary | ICD-10-CM | POA: Diagnosis not present

## 2020-02-01 DIAGNOSIS — Z23 Encounter for immunization: Secondary | ICD-10-CM | POA: Diagnosis not present

## 2020-02-01 DIAGNOSIS — N182 Chronic kidney disease, stage 2 (mild): Secondary | ICD-10-CM | POA: Diagnosis not present

## 2020-02-09 ENCOUNTER — Other Ambulatory Visit: Payer: Self-pay | Admitting: Cardiology

## 2020-02-09 DIAGNOSIS — I209 Angina pectoris, unspecified: Secondary | ICD-10-CM

## 2020-02-17 DIAGNOSIS — E119 Type 2 diabetes mellitus without complications: Secondary | ICD-10-CM | POA: Diagnosis not present

## 2020-02-17 DIAGNOSIS — Z7689 Persons encountering health services in other specified circumstances: Secondary | ICD-10-CM | POA: Diagnosis not present

## 2020-02-17 DIAGNOSIS — R7989 Other specified abnormal findings of blood chemistry: Secondary | ICD-10-CM | POA: Diagnosis not present

## 2020-02-17 DIAGNOSIS — D638 Anemia in other chronic diseases classified elsewhere: Secondary | ICD-10-CM | POA: Diagnosis not present

## 2020-03-08 ENCOUNTER — Ambulatory Visit: Payer: Medicare Other | Admitting: Cardiology

## 2020-03-12 ENCOUNTER — Ambulatory Visit: Payer: Medicare PPO | Admitting: Cardiology

## 2020-03-13 ENCOUNTER — Other Ambulatory Visit: Payer: Self-pay | Admitting: Cardiology

## 2020-03-13 DIAGNOSIS — I209 Angina pectoris, unspecified: Secondary | ICD-10-CM

## 2020-03-16 ENCOUNTER — Ambulatory Visit: Payer: Medicare PPO | Admitting: Cardiology

## 2020-03-16 ENCOUNTER — Other Ambulatory Visit: Payer: Self-pay

## 2020-03-16 ENCOUNTER — Encounter: Payer: Self-pay | Admitting: Cardiology

## 2020-03-16 VITALS — BP 127/45 | HR 62 | Resp 16 | Ht 64.0 in | Wt 180.8 lb

## 2020-03-16 DIAGNOSIS — I25118 Atherosclerotic heart disease of native coronary artery with other forms of angina pectoris: Secondary | ICD-10-CM | POA: Diagnosis not present

## 2020-03-16 DIAGNOSIS — Z86711 Personal history of pulmonary embolism: Secondary | ICD-10-CM

## 2020-03-16 DIAGNOSIS — I6521 Occlusion and stenosis of right carotid artery: Secondary | ICD-10-CM | POA: Diagnosis not present

## 2020-03-16 DIAGNOSIS — I10 Essential (primary) hypertension: Secondary | ICD-10-CM | POA: Diagnosis not present

## 2020-03-16 MED ORDER — ASPIRIN 81 MG PO CHEW: 81.0000 mg | CHEWABLE_TABLET | Freq: Every day | ORAL | Status: DC

## 2020-03-16 NOTE — Progress Notes (Signed)
Primary Physician/Referring:  Burnard Bunting, MD  Patient ID: Desiree Leon, female    DOB: 08/01/1940, 80 y.o.   MRN: [id number]  Chief Complaint  Patient presents with  . Coronary Artery Disease  . Carotid Bruit  . Follow-up    1 year   HPI:    Desiree Leon  is a 80 y.o. Caucasian female  follow-up of coronary artery disease h/o NSTEMI on 04/18/2014 S/P balloon angioplasty of the LAD and D2 with 80% residual stenosis which was not amenable to further intervention due to the LAD being a small artery. and mild peripheral arterial disease, left leg sciatica and chronic back pain, hypertension and hyperlipidemia presents for annual visit.   She has had occasional episodes of angina pectoris responsive to nitroglycerin.  She had melanotic stools and syncope in Sept 2019 and needed iron transfusion. She has not had any recurrence of melena. Patient was admitted to [facility] on 04/11/2019 with bilateral pulmonary emboli, she was diagnosed with pneumonia and placed on antibiotic about a week ago prior to this. She was discharged home on Eliquis.  She has not had any further episodes of worsening dyspnea, no leg edema, no chest pain.  She is presently doing well otherwise.  Accompanied by her daughter.  Past Medical History:  Diagnosis Date  . Anxiety   . Blood transfusion   . CAP (community acquired pneumonia)   . COPD (chronic obstructive pulmonary disease) ([place])   . Heart murmur   . History of colon polyps   . Hyperlipidemia   . MI (myocardial infarction) ([place])   . Mitral valve prolapse   . NSTEMI (non-ST elevated myocardial infarction) ([place])   . Osteoarthritis   . Osteoporosis   . Pulmonary emboli ([place]) 04/12/2019  . Spinal stenosis   . Thyroid disease    hypothyroidism  . Unstable angina ([place]) 05/08/2014  . Venous insufficiency    Past Surgical History:  Procedure Laterality Date  . ABDOMINAL HYSTERECTOMY  11/26/1976  . APPENDECTOMY  02/14/1978  . BASAL CELL  CARCINOMA EXCISION  01/26/1990  . COLONOSCOPY  1993, 2003, 2008, 2012  . DILATION AND CURETTAGE OF UTERUS  09/1976  . LEFT HEART CATHETERIZATION WITH CORONARY ANGIOGRAM N/A 04/18/2014   Procedure: LEFT HEART CATHETERIZATION WITH CORONARY ANGIOGRAM;  Surgeon: Peter M Martinique, MD;  Location: [facility] CATH LAB;  Service: Cardiovascular;  Laterality: N/A;  . MASS EXCISION  10/20/2011   Procedure: MINOR EXCISION OF MASS;  Surgeon: Adin Hector, MD;  Location: [facility];  Service: General;  Laterality: N/A;  Excision mass posterior neck  . OOPHORECTOMY  02/14/1978  . SHOULDER SURGERY  10/18/2007   basal cell  . SQUAMOUS CELL CARCINOMA EXCISION    . TONSILLECTOMY  1955  . TUBAL LIGATION  07/08/74   Social History   Tobacco Use  . Smoking status: Former Smoker    Quit date: 03/10/2005    Years since quitting: 15.0  . Smokeless tobacco: Never Used  Substance Use Topics  . Alcohol use: No  Marital Status: Widowed    ROS  Review of Systems  Cardiovascular: Positive for dyspnea on exertion (stable). Negative for chest pain and leg swelling.  Musculoskeletal: Positive for arthritis and back pain (chronic).  Gastrointestinal: Negative for abdominal pain, change in bowel habit and melena.   Objective  Blood pressure (!) 127/45, pulse 62, resp. rate 16, height _0  (1.626 m), weight 180 lb 12.8 oz (82 kg), SpO2 97 %. Body mass  index is 31.03 kg/m.  Vitals with BMI 03/16/2020 10/28/2019 10/28/2019  Height _0  - -  Weight 180 lbs 13 oz - -  BMI 63.33 - -  Systolic 545 625 638  Diastolic 45 49 46  Pulse 62 55 63     Physical Exam Constitutional:      Comments: Moderately built and mildly obese in no acute distress.   Neck:     Thyroid: No thyromegaly.  Cardiovascular:     Rate and Rhythm: Normal rate and regular rhythm.     Pulses:          Carotid pulses are on the right side with bruit.      Popliteal pulses are 2+ on the right side and 2+ on the left side.       Dorsalis pedis  pulses are 0 on the right side and 0 on the left side.       Posterior tibial pulses are 2+ on the right side and 2+ on the left side.     Heart sounds: Normal heart sounds. No murmur heard. No gallop.      Comments: No leg edema, no JVD. Pulmonary:     Effort: Pulmonary effort is normal.     Breath sounds: Normal breath sounds.  Abdominal:     General: Bowel sounds are normal.     Palpations: Abdomen is soft.  Musculoskeletal:        General: Normal range of motion.     Cervical back: Neck supple.  Skin:    General: Skin is warm and dry.  Neurological:     General: No focal deficit present.     Mental Status: She is alert and oriented to person, place, and time.  Psychiatric:        Mood and Affect: Mood normal.    Laboratory examination:   Recent Labs    04/11/19 1247 04/12/19 0317  NA 139 137  K 4.0 3.7  CL 106 106  CO2 23 23  GLUCOSE 113* 106*  BUN 22 17  CREATININE 0.80 0.78  CALCIUM 9.0 8.7*  GFRNONAA >60 >60  GFRAA >60 >60   CrCl cannot be calculated (Patient's most recent lab result is older than the maximum 21 days allowed.).  CMP Latest Ref Rng & Units 04/12/2019 04/11/2019 11/07/2017  Glucose 70 - 99 mg/dL 106(H) 113(H) 135(H)  BUN 8 - 23 mg/dL _1 Creatinine 0.44 - 1.00 mg/dL 0.78 0.80 0.81  Sodium 135 - 145 mmol/L 137 139 137  Potassium 3.5 - 5.1 mmol/L 3.7 4.0 4.1  Chloride 98 - 111 mmol/L 106 106 106  CO2 22 - 32 mmol/L _2 Calcium 8.9 - 10.3 mg/dL 8.7(L) 9.0 8.8(L)  Total Protein 6.5 - 8.1 g/dL - - 5.9(L)  Total Bilirubin 0.3 - 1.2 mg/dL - - 0.5  Alkaline Phos 38 - 126 U/L - - 76  AST 15 - 41 U/L - - 18  ALT 0 - 44 U/L - - 13   CBC Latest Ref Rng & Units 04/12/2019 04/11/2019 11/07/2017  WBC 4.0 - 10.5 K/uL 9.6 7.6 8.8  Hemoglobin 12.0 - 15.0 g/dL 11.5(L) 12.2 8.8(L)  Hematocrit 36.0 - 46.0 % 34.9(L) 37.4 29.5(L)  Platelets 150 - 400 K/uL 173 197 285   External Labs:   01/07/2018: Hemoglobin A1c 5.3%.  12/31/2017: Creatinine 0.7,  EGFR 80/97, potassium 4.6, CMP normal.  CBC normal.  Cholesterol 174, triglycerides 124, HDL 40, LDL 109.  TSH  low at 0.2.  Free T4 normal at 1.4.  Medications and allergies   Allergies  Allergen Reactions  . Other Anaphylaxis and Cough    fragarances-perfumes  . Dilaudid [Hydromorphone Hcl] Nausea Only    Current Outpatient Medications on File Prior to Visit  Medication Sig Dispense Refill  . ACCU-CHEK SMARTVIEW test strip     . acetaminophen (TYLENOL) 500 MG tablet Take 500 mg by mouth every 6 (six) hours as needed for fever or headache.     . albuterol (VENTOLIN HFA) 108 (90 Base) MCG/ACT inhaler Inhale 1-2 puffs into the lungs every 4 (four) hours as needed for shortness of breath or wheezing.    Marland Kitchen ALPRAZolam (XANAX) 0.25 MG tablet Take 0.125 mg by mouth at bedtime as needed for anxiety.     . Cholecalciferol 25 MCG (1000 UT) CHEW Chew 2,000 Units by mouth daily.     Marland Kitchen escitalopram (LEXAPRO) 5 MG tablet Take 5 mg by mouth every evening.     . iron polysaccharides (NIFEREX) 150 MG capsule Take 150 mg by mouth daily.    . Levothyroxine Sodium 137 MCG CAPS Take 137 mcg by mouth daily before breakfast.     . metoprolol tartrate (LOPRESSOR) 25 MG tablet Take 25 mg by mouth 2 (two) times daily.     . multivitamin-iron-minerals-folic acid (CENTRUM) chewable tablet Chew 1 tablet by mouth daily.    Marland Kitchen niCARdipine (CARDENE) 20 MG capsule Take 20 mg by mouth every evening.     . nitroGLYCERIN (NITRODUR - DOSED IN MG/24 HR) 0.4 mg/hr patch APPLY 1 PATCH ONCE DAILY AS DIRECTED. 30 patch 0  . nitroGLYCERIN (NITROSTAT) 0.4 MG SL tablet Place 0.4 mg under the tongue every 5 (five) minutes as needed for chest pain.    . pantoprazole (PROTONIX) 40 MG tablet Take 1 tablet (40 mg total) by mouth daily at 12 noon. (Patient taking differently: Take 40 mg by mouth 2 (two) times daily.) 30 tablet 1  . Polyethyl Glycol-Propyl Glycol (SYSTANE) 0.4-0.3 % SOLN Place 1 drop into both eyes See admin instructions.  Once to twice daily as needed for dry eyes    . Probiotic Product (ALIGN) 4 MG CAPS Take 4 mg by mouth daily.    . ranolazine (RANEXA) 500 MG 12 hr tablet Take 1 tablet (500 mg total) by mouth 2 (two) times daily. 180 tablet 3  . SYMBICORT 80-4.5 MCG/ACT inhaler     . VYTORIN 10-20 MG per tablet Take 1 tablet by mouth daily at 6 PM.     . [DISCONTINUED] fluticasone (FLONASE) 50 MCG/ACT nasal spray Place 2 sprays into both nostrils daily.     No current facility-administered medications on file prior to visit.     Radiology:   CT angiogram chest 04/11/2019: Cardiovascular: There are 2 small peripheral pulmonary emboli in the right lower lobe. No other pulmonary emboli. Aortic atherosclerosis. RV LV ratio is normal. Mild cardiomegaly. No pericardial effusion.  Cardiac Studies:   Coronary angiogram 04/18/2014:  PTCA and suboptimal balloon angioplasty of mid LAD and D2.  Residual 80% stenosis in the distal LAD not amenable to intervention. LAD collateralized by RCA. Normal LVEF.  Echo- 05/03/14 1. Left ventricle cavity is normal in size. Mild concentric hypertrophy of the left ventricle. Normal global wall motion. Calculated EF 63%. 2. Mild mitral regurgitation. 3. Mild tricuspid regurgitation. No evidence of pulmonary hypertension  Exercise myoview stress 08/11/2014: 1. The resting electrocardiogram demonstrated normal sinus rhythm, normal resting conduction, no resting arrhythmias and  normal rest repolarization.  The stress electrocardiogram was normal.   The patient performed treadmill exercise using a Bruce protocol, completing 5:30 minutes.The patient completed an estimated workload of 7.05 METS, 88% of the maximum predicted heart rate.  The stress test was terminated because of fatigue and THR met. 2. Myocardial perfusion imaging is normal. Overall left ventricular systolic function was normal without regional wall motion abnormalities. The left ventricular ejection fraction was  75%.  Carotid artery duplex  03/23/2019: Stenosis in the right internal carotid artery (16-49%). Peak systolic velocities in the left bifurcation, internal, external and common carotid arteries are within normal limits. Antegrade right vertebral artery flow. Antegrade left vertebral artery flow. Follow up in one year is appropriate if clinically indicated. Compared to 10/05/14, right ICA stenosis new.   EKG:   EKG 03/16/2020: Sinus bradycardia at rate of 57 bpm, normal axis, no evidence of ischemia, normal EKG. No significant change from 03/08/2019.  Assessment     ICD-10-CM   1. Coronary artery disease of native artery of native heart with stable angina pectoris (HCC)  I25.118 EKG 12-Lead    PCV ECHOCARDIOGRAM COMPLETE    aspirin (ASPIRIN CHILDRENS) 81 MG chewable tablet  2. Asymptomatic stenosis of right carotid artery  I65.21   3. Primary hypertension  I10 PCV ECHOCARDIOGRAM COMPLETE  4. History of pulmonary embolism Jan 2021 after pneumonia  Z86.711     Meds ordered this encounter  Medications  . aspirin (ASPIRIN CHILDRENS) 81 MG chewable tablet    Sig: Chew 1 tablet (81 mg total) by mouth daily.   Medications Discontinued During This Encounter  Medication Reason  . doxycycline (VIBRAMYCIN) 100 MG capsule Discontinued by provider  . Apixaban Starter Pack (ELIQUIS STARTER PACK) 5 MG TBPK Dose change  . apixaban (ELIQUIS) 5 MG TABS tablet Completed Course   Orders Placed This Encounter  Procedures  . EKG 12-Lead  . PCV ECHOCARDIOGRAM COMPLETE    Standing Status:   Future    Standing Expiration Date:   03/16/2021       Recommendations:    Desiree Leon  is a 80 y.o. Caucasian female  follow-up of coronary artery disease h/o NSTEMI on 04/18/2014 S/P balloon angioplasty of the LAD and D2 with 80% residual stenosis which was not amenable to further intervention due to the LAD being a small artery. and mild peripheral arterial disease, left leg sciatica and chronic back pain,  hypertension and hyperlipidemia presents for annual visit.     Patient was admitted to [facility] on 04/11/2019 with bilateral pulmonary emboli, she was diagnosed with pneumonia and placed on antibiotic about a week ago prior to this. She was discharged home on Eliquis.   Presently doing well, except for chronic dyspnea from underlying COPD she has fortunately not had recurrence of angina pectoris.  In view of having completed 1 year course of Eliquis, suspect her PE was related to being very immobile with severe pneumonia.  I will discontinue Eliquis.  I am also concerned about potential for GI bleed in view of prior GI bleed history.  I will start her back on aspirin 81 mg daily for underlying CAD.  Reviewed external labs, lipids under good control.  I will repeat echocardiogram as it has been >5 years since prior echocardiogram to reevaluate her LV systolic function and also to evaluate for pulmonary hypertension.  With regard to carotid artery stenosis, needs surveillance duplex.  No changes in the medications were done today.  Blood pressure is  also well controlled.  I will see her back on annual basis.    Adrian Prows, MD, [facility] 03/16/2020, 2:34 PM Office: [phone] Pager: [phone]

## 2020-03-29 ENCOUNTER — Other Ambulatory Visit: Payer: Medicare PPO

## 2020-04-09 ENCOUNTER — Other Ambulatory Visit: Payer: Self-pay | Admitting: Cardiology

## 2020-04-09 DIAGNOSIS — I209 Angina pectoris, unspecified: Secondary | ICD-10-CM

## 2020-04-10 ENCOUNTER — Other Ambulatory Visit: Payer: Self-pay

## 2020-04-10 ENCOUNTER — Ambulatory Visit: Payer: Medicare PPO

## 2020-04-10 DIAGNOSIS — I10 Essential (primary) hypertension: Secondary | ICD-10-CM

## 2020-04-10 DIAGNOSIS — I25118 Atherosclerotic heart disease of native coronary artery with other forms of angina pectoris: Secondary | ICD-10-CM

## 2020-04-10 DIAGNOSIS — I6521 Occlusion and stenosis of right carotid artery: Secondary | ICD-10-CM | POA: Diagnosis not present

## 2020-04-10 DIAGNOSIS — R0989 Other specified symptoms and signs involving the circulatory and respiratory systems: Secondary | ICD-10-CM

## 2020-04-15 ENCOUNTER — Other Ambulatory Visit: Payer: Self-pay | Admitting: Cardiology

## 2020-04-15 DIAGNOSIS — I6521 Occlusion and stenosis of right carotid artery: Secondary | ICD-10-CM

## 2020-04-18 DIAGNOSIS — Z01419 Encounter for gynecological examination (general) (routine) without abnormal findings: Secondary | ICD-10-CM | POA: Diagnosis not present

## 2020-04-18 DIAGNOSIS — N958 Other specified menopausal and perimenopausal disorders: Secondary | ICD-10-CM | POA: Diagnosis not present

## 2020-04-18 DIAGNOSIS — Z1231 Encounter for screening mammogram for malignant neoplasm of breast: Secondary | ICD-10-CM | POA: Diagnosis not present

## 2020-04-18 DIAGNOSIS — Z6830 Body mass index (BMI) 30.0-30.9, adult: Secondary | ICD-10-CM | POA: Diagnosis not present

## 2020-04-23 ENCOUNTER — Other Ambulatory Visit: Payer: Self-pay | Admitting: Cardiology

## 2020-04-23 DIAGNOSIS — I2510 Atherosclerotic heart disease of native coronary artery without angina pectoris: Secondary | ICD-10-CM | POA: Diagnosis not present

## 2020-04-23 DIAGNOSIS — Z9981 Dependence on supplemental oxygen: Secondary | ICD-10-CM | POA: Diagnosis not present

## 2020-04-23 DIAGNOSIS — R059 Cough, unspecified: Secondary | ICD-10-CM | POA: Diagnosis not present

## 2020-04-23 DIAGNOSIS — U071 COVID-19: Secondary | ICD-10-CM | POA: Diagnosis not present

## 2020-04-23 DIAGNOSIS — I5042 Chronic combined systolic (congestive) and diastolic (congestive) heart failure: Secondary | ICD-10-CM | POA: Diagnosis not present

## 2020-04-23 DIAGNOSIS — E1159 Type 2 diabetes mellitus with other circulatory complications: Secondary | ICD-10-CM | POA: Diagnosis not present

## 2020-04-23 DIAGNOSIS — I130 Hypertensive heart and chronic kidney disease with heart failure and stage 1 through stage 4 chronic kidney disease, or unspecified chronic kidney disease: Secondary | ICD-10-CM | POA: Diagnosis not present

## 2020-04-23 DIAGNOSIS — Z1152 Encounter for screening for COVID-19: Secondary | ICD-10-CM | POA: Diagnosis not present

## 2020-04-24 ENCOUNTER — Other Ambulatory Visit (HOSPITAL_BASED_OUTPATIENT_CLINIC_OR_DEPARTMENT_OTHER): Payer: Self-pay | Admitting: Nurse Practitioner

## 2020-04-24 ENCOUNTER — Telehealth (HOSPITAL_COMMUNITY): Payer: Medicare PPO | Admitting: Nurse Practitioner

## 2020-04-24 ENCOUNTER — Ambulatory Visit (HOSPITAL_COMMUNITY)
Admission: RE | Admit: 2020-04-24 | Discharge: 2020-04-24 | Disposition: A | Discharge status: Home / Self Care | Payer: Medicare PPO | Source: Ambulatory Visit | Attending: Pulmonary Disease | Admitting: Pulmonary Disease

## 2020-04-24 ENCOUNTER — Telehealth: Payer: Self-pay

## 2020-04-24 DIAGNOSIS — E1159 Type 2 diabetes mellitus with other circulatory complications: Secondary | ICD-10-CM

## 2020-04-24 DIAGNOSIS — I6529 Occlusion and stenosis of unspecified carotid artery: Secondary | ICD-10-CM

## 2020-04-24 DIAGNOSIS — Z9981 Dependence on supplemental oxygen: Secondary | ICD-10-CM

## 2020-04-24 DIAGNOSIS — D649 Anemia, unspecified: Secondary | ICD-10-CM

## 2020-04-24 DIAGNOSIS — I5042 Chronic combined systolic (congestive) and diastolic (congestive) heart failure: Secondary | ICD-10-CM

## 2020-04-24 DIAGNOSIS — I200 Unstable angina: Secondary | ICD-10-CM

## 2020-04-24 DIAGNOSIS — N182 Chronic kidney disease, stage 2 (mild): Secondary | ICD-10-CM

## 2020-04-24 DIAGNOSIS — U071 COVID-19: Secondary | ICD-10-CM | POA: Diagnosis not present

## 2020-04-24 DIAGNOSIS — I2699 Other pulmonary embolism without acute cor pulmonale: Secondary | ICD-10-CM

## 2020-04-24 DIAGNOSIS — J449 Chronic obstructive pulmonary disease, unspecified: Secondary | ICD-10-CM

## 2020-04-24 DIAGNOSIS — E119 Type 2 diabetes mellitus without complications: Secondary | ICD-10-CM

## 2020-04-24 DIAGNOSIS — E663 Overweight: Secondary | ICD-10-CM

## 2020-04-24 DIAGNOSIS — E1121 Type 2 diabetes mellitus with diabetic nephropathy: Secondary | ICD-10-CM

## 2020-04-24 DIAGNOSIS — E039 Hypothyroidism, unspecified: Secondary | ICD-10-CM

## 2020-04-24 DIAGNOSIS — I10 Essential (primary) hypertension: Secondary | ICD-10-CM

## 2020-04-24 DIAGNOSIS — I341 Nonrheumatic mitral (valve) prolapse: Secondary | ICD-10-CM

## 2020-04-24 DIAGNOSIS — I130 Hypertensive heart and chronic kidney disease with heart failure and stage 1 through stage 4 chronic kidney disease, or unspecified chronic kidney disease: Secondary | ICD-10-CM

## 2020-04-24 MED ORDER — SOTROVIMAB 500 MG/8ML IV SOLN
500.0000 mg | Freq: Once | INTRAVENOUS | Status: AC
  Administered 2020-04-24: 500 mg via INTRAVENOUS

## 2020-04-24 MED ORDER — ALBUTEROL SULFATE HFA 108 (90 BASE) MCG/ACT IN AERS: 2.0000 | INHALATION_SPRAY | Freq: Once | RESPIRATORY_TRACT | Status: DC | PRN

## 2020-04-24 MED ORDER — SODIUM CHLORIDE 0.9 % IV SOLN: INTRAVENOUS | Status: DC | PRN

## 2020-04-24 MED ORDER — METHYLPREDNISOLONE SODIUM SUCC 125 MG IJ SOLR: 125.0000 mg | Freq: Once | INTRAMUSCULAR | Status: DC | PRN

## 2020-04-24 MED ORDER — EPINEPHRINE 0.3 MG/0.3ML IJ SOAJ: 0.3000 mg | Freq: Once | INTRAMUSCULAR | Status: DC | PRN

## 2020-04-24 MED ORDER — DIPHENHYDRAMINE HCL 50 MG/ML IJ SOLN: 50.0000 mg | Freq: Once | INTRAMUSCULAR | Status: DC | PRN

## 2020-04-24 MED ORDER — FAMOTIDINE IN NACL 20-0.9 MG/50ML-% IV SOLN: 20.0000 mg | Freq: Once | INTRAVENOUS | Status: DC | PRN

## 2020-04-24 NOTE — Progress Notes (Signed)
Diagnosis: COVID-19  Physician: Dr. Patrick Wright  Procedure: Covid Infusion Clinic Med: Sotrovimab infusion - Provided patient with sotrovimab fact sheet for patients, parents, and caregivers prior to infusion.   Complications: No immediate complications noted  Discharge: Discharged home    

## 2020-04-24 NOTE — Progress Notes (Signed)
Patient reviewed Fact Sheet for Patients, Parents, and Caregivers for Emergency Use Authorization (EUA) of sotrovimab for the Treatment of Coronavirus. Patient also reviewed and is agreeable to the estimated cost of treatment. Patient is agreeable to proceed.   

## 2020-04-24 NOTE — Telephone Encounter (Signed)
Called to discuss with patient about COVID-19 symptoms and the use of one of the available treatments for those with mild to moderate Covid symptoms and at a high risk of hospitalization.  Pt appears to qualify for outpatient treatment due to co-morbid conditions and/or a member of an at-risk group in accordance with the FDA Emergency Use Authorization.    Symptom onset: 02/11 Vaccinated: y Booster? y Immunocompromised? n Qualifiers: Age, BMI, HTN, CVD, Chronic O2, DM, COPD, CKD, Hypothyroid  Patient scheduled.    Orma Render

## 2020-04-24 NOTE — Progress Notes (Signed)
I connected by phone with Desiree Leon on 04/24/2020 at 11:39 AM to discuss the potential use of a new treatment for mild to moderate COVID-19 viral infection in non-hospitalized patients.  This patient is a 80 y.o. female that meets the FDA criteria for Emergency Use Authorization of COVID monoclonal antibody sotrovimab.  Has a (+) direct SARS-CoV-2 viral test result  Has mild or moderate COVID-19   Is NOT hospitalized due to COVID-19  Is within 10 days of symptom onset  Has at least one of the high risk factor(s) for progression to severe COVID-19 and/or hospitalization as defined in EUA.  Specific high risk criteria : Older age (>/= 81 yo), BMI > 25, Chronic Kidney Disease (CKD), Diabetes, Cardiovascular disease or hypertension, Chronic Lung Disease and Medical-related technological dependence   I have spoken and communicated the following to the patient or parent/caregiver regarding COVID monoclonal antibody treatment:  1. FDA has authorized the emergency use for the treatment of mild to moderate COVID-19 in adults and pediatric patients with positive results of direct SARS-CoV-2 viral testing who are 35 years of age and older weighing at least 40 kg, and who are at high risk for progressing to severe COVID-19 and/or hospitalization.  2. The significant known and potential risks and benefits of COVID monoclonal antibody, and the extent to which such potential risks and benefits are unknown.  3. Information on available alternative treatments and the risks and benefits of those alternatives, including clinical trials.  4. Patients treated with COVID monoclonal antibody should continue to self-isolate and use infection control measures (e.g., wear mask, isolate, social distance, avoid sharing personal items, clean and disinfect "high touch" surfaces, and frequent handwashing) according to CDC guidelines.   5. The patient or parent/caregiver has the option to accept or refuse COVID  monoclonal antibody treatment.  After reviewing this information with the patient, the patient has agreed to receive one of the available covid 19 monoclonal antibodies and will be provided an appropriate fact sheet prior to infusion. Orma Render, NP 04/24/2020 11:39 AM

## 2020-04-24 NOTE — Discharge Instructions (Signed)

## 2020-04-24 NOTE — Telephone Encounter (Signed)
Called to discuss with patient about COVID-19 symptoms and the use of one of the available treatments for those with mild to moderate Covid symptoms and at a high risk of hospitalization.  Pt appears to qualify for outpatient treatment due to co-morbid conditions and/or a member of an at-risk group in accordance with the FDA Emergency Use Authorization.    Symptom onset: 04/20/20 nasal congestion,cough,body aches Vaccinated: Yes Booster? Yes Immunocompromised?  Qualifiers: CAD,CHF,DM.COPD  Pt. Would like to speak to APP.  Desiree Leon

## 2020-05-01 ENCOUNTER — Emergency Department (HOSPITAL_BASED_OUTPATIENT_CLINIC_OR_DEPARTMENT_OTHER): Payer: Medicare PPO

## 2020-05-01 ENCOUNTER — Encounter (HOSPITAL_BASED_OUTPATIENT_CLINIC_OR_DEPARTMENT_OTHER): Payer: Self-pay

## 2020-05-01 ENCOUNTER — Emergency Department (HOSPITAL_BASED_OUTPATIENT_CLINIC_OR_DEPARTMENT_OTHER)
Admission: EM | Admit: 2020-05-01 | Discharge: 2020-05-01 | Disposition: A | Discharge status: Home / Self Care | Payer: Medicare PPO | Attending: Emergency Medicine | Admitting: Emergency Medicine

## 2020-05-01 ENCOUNTER — Other Ambulatory Visit: Payer: Self-pay

## 2020-05-01 DIAGNOSIS — Z87891 Personal history of nicotine dependence: Secondary | ICD-10-CM | POA: Insufficient documentation

## 2020-05-01 DIAGNOSIS — I5042 Chronic combined systolic (congestive) and diastolic (congestive) heart failure: Secondary | ICD-10-CM | POA: Insufficient documentation

## 2020-05-01 DIAGNOSIS — E1122 Type 2 diabetes mellitus with diabetic chronic kidney disease: Secondary | ICD-10-CM | POA: Diagnosis not present

## 2020-05-01 DIAGNOSIS — Z7982 Long term (current) use of aspirin: Secondary | ICD-10-CM | POA: Diagnosis not present

## 2020-05-01 DIAGNOSIS — J441 Chronic obstructive pulmonary disease with (acute) exacerbation: Secondary | ICD-10-CM | POA: Diagnosis not present

## 2020-05-01 DIAGNOSIS — Z7951 Long term (current) use of inhaled steroids: Secondary | ICD-10-CM | POA: Insufficient documentation

## 2020-05-01 DIAGNOSIS — J449 Chronic obstructive pulmonary disease, unspecified: Secondary | ICD-10-CM | POA: Diagnosis not present

## 2020-05-01 DIAGNOSIS — I130 Hypertensive heart and chronic kidney disease with heart failure and stage 1 through stage 4 chronic kidney disease, or unspecified chronic kidney disease: Secondary | ICD-10-CM | POA: Diagnosis not present

## 2020-05-01 DIAGNOSIS — Z7901 Long term (current) use of anticoagulants: Secondary | ICD-10-CM | POA: Diagnosis not present

## 2020-05-01 DIAGNOSIS — J9811 Atelectasis: Secondary | ICD-10-CM | POA: Diagnosis not present

## 2020-05-01 DIAGNOSIS — R0602 Shortness of breath: Secondary | ICD-10-CM | POA: Diagnosis not present

## 2020-05-01 DIAGNOSIS — N182 Chronic kidney disease, stage 2 (mild): Secondary | ICD-10-CM | POA: Diagnosis not present

## 2020-05-01 DIAGNOSIS — E039 Hypothyroidism, unspecified: Secondary | ICD-10-CM | POA: Insufficient documentation

## 2020-05-01 DIAGNOSIS — Z79899 Other long term (current) drug therapy: Secondary | ICD-10-CM | POA: Insufficient documentation

## 2020-05-01 DIAGNOSIS — I517 Cardiomegaly: Secondary | ICD-10-CM | POA: Diagnosis not present

## 2020-05-01 DIAGNOSIS — U071 COVID-19: Secondary | ICD-10-CM

## 2020-05-01 LAB — CBC WITH DIFFERENTIAL/PLATELET
Abs Immature Granulocytes: 0.01 10*3/uL (ref 0.00–0.07)
Basophils Absolute: 0 10*3/uL (ref 0.0–0.1)
Basophils Relative: 0 %
Eosinophils Absolute: 0.1 10*3/uL (ref 0.0–0.5)
Eosinophils Relative: 2 %
HCT: 36 % (ref 36.0–46.0)
Hemoglobin: 11.6 g/dL — ABNORMAL LOW (ref 12.0–15.0)
Immature Granulocytes: 0 %
Lymphocytes Relative: 41 %
Lymphs Abs: 2.8 10*3/uL (ref 0.7–4.0)
MCH: 29.9 pg (ref 26.0–34.0)
MCHC: 32.2 g/dL (ref 30.0–36.0)
MCV: 92.8 fL (ref 80.0–100.0)
Monocytes Absolute: 0.5 10*3/uL (ref 0.1–1.0)
Monocytes Relative: 7 %
Neutro Abs: 3.5 10*3/uL (ref 1.7–7.7)
Neutrophils Relative %: 50 %
Platelets: 266 10*3/uL (ref 150–400)
RBC: 3.88 MIL/uL (ref 3.87–5.11)
RDW: 14.3 % (ref 11.5–15.5)
WBC: 6.8 10*3/uL (ref 4.0–10.5)
nRBC: 0 % (ref 0.0–0.2)

## 2020-05-01 LAB — I-STAT VENOUS BLOOD GAS, ED
Acid-Base Excess: 1 mmol/L (ref 0.0–2.0)
Bicarbonate: 26.5 mmol/L (ref 20.0–28.0)
Calcium, Ion: 1.24 mmol/L (ref 1.15–1.40)
HCT: 36 % (ref 36.0–46.0)
Hemoglobin: 12.2 g/dL (ref 12.0–15.0)
O2 Saturation: 60 %
Potassium: 3.7 mmol/L (ref 3.5–5.1)
Sodium: 139 mmol/L (ref 135–145)
TCO2: 28 mmol/L (ref 22–32)
pCO2, Ven: 43.5 mmHg — ABNORMAL LOW (ref 44.0–60.0)
pH, Ven: 7.393 (ref 7.250–7.430)
pO2, Ven: 32 mmHg (ref 32.0–45.0)

## 2020-05-01 LAB — RESP PANEL BY RT-PCR (FLU A&B, COVID) ARPGX2
Influenza A by PCR: NEGATIVE
Influenza B by PCR: NEGATIVE
SARS Coronavirus 2 by RT PCR: POSITIVE — AB

## 2020-05-01 LAB — COMPREHENSIVE METABOLIC PANEL
ALT: 16 U/L (ref 0–44)
AST: 18 U/L (ref 15–41)
Albumin: 3.8 g/dL (ref 3.5–5.0)
Alkaline Phosphatase: 79 U/L (ref 38–126)
Anion gap: 9 (ref 5–15)
BUN: 16 mg/dL (ref 8–23)
CO2: 25 mmol/L (ref 22–32)
Calcium: 8.8 mg/dL — ABNORMAL LOW (ref 8.9–10.3)
Chloride: 104 mmol/L (ref 98–111)
Creatinine, Ser: 0.61 mg/dL (ref 0.44–1.00)
GFR, Estimated: >60 mL/min (ref >60)
Glucose, Bld: 105 mg/dL — ABNORMAL HIGH (ref 70–99)
Potassium: 3.7 mmol/L (ref 3.5–5.1)
Sodium: 138 mmol/L (ref 135–145)
Total Bilirubin: 0.3 mg/dL (ref 0.3–1.2)
Total Protein: 6.4 g/dL — ABNORMAL LOW (ref 6.5–8.1)

## 2020-05-01 LAB — PROCALCITONIN: Procalcitonin: <0.1 ng/mL

## 2020-05-01 LAB — TRIGLYCERIDES: Triglycerides: 206 mg/dL — ABNORMAL HIGH (ref <150)

## 2020-05-01 LAB — LACTIC ACID, PLASMA: Lactic Acid, Venous: 1.8 mmol/L (ref 0.5–1.9)

## 2020-05-01 LAB — D-DIMER, QUANTITATIVE: D-Dimer, Quant: 0.7 ug/mL-FEU — ABNORMAL HIGH (ref 0.00–0.50)

## 2020-05-01 LAB — LACTATE DEHYDROGENASE: LDH: 115 U/L (ref 98–192)

## 2020-05-01 LAB — TROPONIN I (HIGH SENSITIVITY): Troponin I (High Sensitivity): 5 ng/L (ref <18)

## 2020-05-01 LAB — FIBRINOGEN: Fibrinogen: 441 mg/dL (ref 210–475)

## 2020-05-01 LAB — C-REACTIVE PROTEIN: CRP: 1 mg/dL — ABNORMAL HIGH (ref <1.0)

## 2020-05-01 LAB — FERRITIN: Ferritin: 13 ng/mL (ref 11–307)

## 2020-05-01 MED ORDER — ALBUTEROL SULFATE HFA 108 (90 BASE) MCG/ACT IN AERS
2.0000 | INHALATION_SPRAY | Freq: Once | RESPIRATORY_TRACT | Status: AC
  Administered 2020-05-01: 2 via RESPIRATORY_TRACT
  Filled 2020-05-01: qty 6.7

## 2020-05-01 MED ORDER — DEXAMETHASONE SODIUM PHOSPHATE 10 MG/ML IJ SOLN
10.0000 mg | Freq: Once | INTRAMUSCULAR | Status: AC
  Administered 2020-05-01: 10 mg via INTRAVENOUS
  Filled 2020-05-01: qty 1

## 2020-05-01 MED ORDER — PREDNISONE 20 MG PO TABS: ORAL_TABLET | ORAL | 0 refills | Status: AC

## 2020-05-01 NOTE — ED Notes (Signed)
Pt ambulated in room, SpO2 maintained between 98-99%. Appeared more tachypneic and SOB.

## 2020-05-01 NOTE — ED Triage Notes (Signed)
Pt tested positive for COVID on 2/14. Received antibody infusion on 2/15, where she thought she was improving but then started to get increasingly short of breath on 2/18. States has been short of breath with cough since the start of covid. Has chest heaviness. Hx of  COPD

## 2020-05-01 NOTE — Progress Notes (Signed)
Patient arrives COVID positive w/ ShOB. Patient currently on RA with O2 sats of 98-99%. BBS Clear/diminished. Patient endorses a history of COPD and cardiac diagnoses. Home respiratory meds include Symbicort and alb PRN.

## 2020-05-01 NOTE — ED Provider Notes (Signed)
[facility] Provider Note   CSN: [id number] Arrival date & time: 05/01/20  1524     History Chief Complaint  Patient presents with  . Shortness of Breath    Desiree Leon is a 80 y.o. female history of MI, NSTEMI, here presenting with shortness of breath.  Patient was diagnosed with COVID on 2/14 at her doctor's office.  Patient received monoclonal antibody infusion the following day.  She states that since then she has been having persistent shortness of breath.  She has nonproductive cough as well.  She has been using her albuterol pump as needed and has not been helping.  She also has been taking Omnicef as well.  She states that she received her vaccines including a booster.   The history is provided by the patient.       Past Medical History:  Diagnosis Date  . Anxiety   . Blood transfusion   . CAP (community acquired pneumonia)   . COPD (chronic obstructive pulmonary disease) ([place])   . Heart murmur   . History of colon polyps   . Hyperlipidemia   . MI (myocardial infarction) ([place])   . Mitral valve prolapse   . NSTEMI (non-ST elevated myocardial infarction) ([place])   . Osteoarthritis   . Osteoporosis   . Pulmonary emboli ([place]) 04/12/2019  . Spinal stenosis   . Thyroid disease    hypothyroidism  . Unstable angina ([place]) 05/08/2014  . Venous insufficiency     Patient Active Problem List   Diagnosis Date Noted  . Low back pain 09/26/2019  . Gastro-esophageal reflux disease without esophagitis 04/21/2019  . Carotid artery occlusion 04/21/2019  . Pulmonary embolism ([place]) 04/12/2019  . History of iron deficiency anemia 04/11/2019  . Pneumococcal pneumonia ([place]) 04/04/2019  . COPD (chronic obstructive pulmonary disease) ([place])   . Dysphagia 10/18/2018  . Hypertensive heart and chronic kidney disease with heart failure and stage 1 through stage 4 chronic kidney disease, or unspecified chronic kidney disease ([place]) 05/04/2018  . Disequilibrium  11/10/2017  . History of fall 11/10/2017  . Oxygen dependent 08/27/2017  . Overweight 05/04/2017  . Chronic anemia 01/02/2017  . Dyspnea 08/12/2016  . Chronic combined systolic and diastolic heart failure ([place]) 05/14/2015  . Hardening of the aorta (main artery of the heart) ([place]) 05/14/2015  . Type 2 diabetes mellitus with other circulatory complications ([place]) 32/44/0102  . Encounter for general adult medical examination without abnormal findings 01/01/2015  . Sedative, hypnotic or anxiolytic dependence, uncomplicated ([place]) 72/53/6644  . Unstable angina ([place]) 05/08/2014  . Anxiety disorder 04/26/2014  . Major depression, single episode 04/26/2014  . Mitral valve prolapse 04/18/2014  . Diabetes mellitus, type II ([place]) 04/18/2014  . Dyslipidemia 04/18/2014  . Heart disease 01/02/2014  . Peripheral venous insufficiency 12/05/2008  . Hypothyroidism 12/04/2008  . Essential hypertension 12/04/2008  . Chronic kidney disease, stage 2 (mild) 12/04/2008  . Diabetic renal disease ([place]) 12/04/2008  . Hyperlipidemia 12/04/2008  . Osteoarthritis 12/04/2008    Past Surgical History:  Procedure Laterality Date  . ABDOMINAL HYSTERECTOMY  11/26/1976  . APPENDECTOMY  02/14/1978  . BASAL CELL CARCINOMA EXCISION  01/26/1990  . COLONOSCOPY  1993, 2003, 2008, 2012  . DILATION AND CURETTAGE OF UTERUS  09/1976  . LEFT HEART CATHETERIZATION WITH CORONARY ANGIOGRAM N/A 04/18/2014   Procedure: LEFT HEART CATHETERIZATION WITH CORONARY ANGIOGRAM;  Surgeon: Peter M Martinique, MD;  Location: [facility] CATH LAB;  Service: Cardiovascular;  Laterality: N/A;  . MASS EXCISION  10/20/2011   Procedure: MINOR EXCISION OF MASS;  Surgeon: Adin Hector, MD;  Location: [facility];  Service: General;  Laterality: N/A;  Excision mass posterior neck  . OOPHORECTOMY  02/14/1978  . SHOULDER SURGERY  10/18/2007   basal cell  . SQUAMOUS CELL CARCINOMA EXCISION    . TONSILLECTOMY  1955  . TUBAL LIGATION  07/08/74     OB  History   No obstetric history on file.     Family History  Problem Relation Age of Onset  . Heart disease Mother   . Heart disease Father   . Cancer Sister        breast  . Cancer Daughter        cervical and bone  . Heart disease Sister   . Thyroid disease Sister     Social History   Tobacco Use  . Smoking status: Former Smoker    Quit date: 03/10/2005    Years since quitting: 15.1  . Smokeless tobacco: Never Used  Vaping Use  . Vaping Use: Never used  Substance Use Topics  . Alcohol use: No  . Drug use: No    Home Medications Prior to Admission medications   Medication Sig Start Date End Date Taking? Authorizing Provider  ACCU-CHEK SMARTVIEW test strip  10/30/17   [provider]  acetaminophen (TYLENOL) 500 MG tablet Take 500 mg by mouth every 6 (six) hours as needed for fever or headache.     [provider]  albuterol (VENTOLIN HFA) 108 (90 Base) MCG/ACT inhaler Inhale 1-2 puffs into the lungs every 4 (four) hours as needed for shortness of breath or wheezing. 04/04/19   [provider]  ALPRAZolam Duanne Moron) 0.25 MG tablet Take 0.125 mg by mouth at bedtime as needed for anxiety.     [provider]  apixaban (ELIQUIS) 5 MG TABS tablet Take 1 tablet by mouth 2 (two) times daily.    [provider]  aspirin (ASPIRIN CHILDRENS) 81 MG chewable tablet Chew 1 tablet (81 mg total) by mouth daily. 03/16/20   Adrian Prows, MD  cefdinir (OMNICEF) 300 MG capsule See admin instructions. 04/23/20   [provider]  cholecalciferol (VITAMIN D) 25 MCG (1000 UNIT) tablet 2 capsules.    [provider]  Cholecalciferol 25 MCG (1000 UT) CHEW Chew 2,000 Units by mouth daily.     [provider]  Elastic Bandages & Supports (O-2 SUSPENSORY) MISC 8 hrs every night 01/08/15   [provider]  escitalopram (LEXAPRO) 5 MG tablet Take 5 mg by mouth every evening.     [provider]  glucose blood test strip  Accu-Chek SmartView Test Strips  CHECK BLOOD SUGAR TWICE DAILY.    [provider]  iron polysaccharides (NIFEREX) 150 MG capsule Take 150 mg by mouth daily.    [provider]  Levothyroxine Sodium 137 MCG CAPS Take 137 mcg by mouth daily before breakfast.  08/25/11   [provider]  methocarbamol (ROBAXIN) 500 MG tablet 1 tab TID prn muscle spasms 09/26/19   [provider]  metoprolol tartrate (LOPRESSOR) 25 MG tablet Take 25 mg by mouth 2 (two) times daily.  09/12/11   [provider]  multivitamin-iron-minerals-folic acid (CENTRUM) chewable tablet Chew 1 tablet by mouth daily.    [provider]  niCARdipine (CARDENE) 20 MG capsule Take 20 mg by mouth every evening.  09/12/11   [provider]  nitroGLYCERIN (NITRODUR - DOSED IN MG/24 HR) 0.4  mg/hr patch APPLY 1 PATCH ONCE DAILY AS DIRECTED. 04/09/20   Adrian Prows, MD  nitroGLYCERIN (NITROSTAT) 0.4 MG SL tablet Place 0.4 mg under the tongue every 5 (five) minutes as needed for chest pain.    [provider]  pantoprazole (PROTONIX) 40 MG tablet Take 1 tablet (40 mg total) by mouth daily at 12 noon. Patient taking differently: Take 40 mg by mouth 2 (two) times daily. 04/21/14   Barton Dubois, MD  Polyethyl Glycol-Propyl Glycol (SYSTANE) 0.4-0.3 % SOLN Place 1 drop into both eyes See admin instructions. Once to twice daily as needed for dry eyes    [provider]  Probiotic Product (ALIGN PO) Align    [provider]  Probiotic Product (ALIGN) 4 MG CAPS Take 4 mg by mouth daily.    [provider]  ranolazine (RANEXA) 500 MG 12 hr tablet TAKE 1 TABLET EVERY 12 HOURS. 04/23/20   Adrian Prows, MD  SYMBICORT 80-4.5 MCG/ACT inhaler  10/25/19   [provider]  VYTORIN 10-20 MG per tablet Take 1 tablet by mouth daily at 6 PM.  09/18/11   [provider]  fluticasone (FLONASE) 50 MCG/ACT nasal spray Place 2 sprays into both nostrils daily. 04/04/19  09/12/19  [provider]    Allergies    Other, Dilaudid [hydromorphone hcl], and Erythromycin  Review of Systems   Review of Systems  Respiratory: Positive for shortness of breath.   All other systems reviewed and are negative.   Physical Exam Updated Vital Signs BP (!) 152/74 (BP Location: Right Arm)   Pulse (!) 54   Temp 98.3 F (36.8 C) (Oral)   Resp 18   Ht 5\' 4"  (1.626 m)   Wt 80.7 kg   SpO2 98%   BMI 30.55 kg/m   Physical Exam Vitals and nursing note reviewed.  Constitutional:      Comments: Slightly tachypneic  HENT:     Head: Normocephalic.     Mouth/Throat:     Mouth: Mucous membranes are moist.  Eyes:     Extraocular Movements: Extraocular movements intact.     Pupils: Pupils are equal, round, and reactive to light.  Cardiovascular:     Rate and Rhythm: Normal rate and regular rhythm.  Pulmonary:     Comments: Slightly tachypneic and diminished breath sounds bilaterally but no wheezing or crackles Musculoskeletal:        General: Normal range of motion.     Cervical back: Normal range of motion and neck supple.  Skin:    General: Skin is warm.     Capillary Refill: Capillary refill takes less than 2 seconds.  Neurological:     General: No focal deficit present.     Mental Status: She is oriented to person, place, and time.  Psychiatric:        Mood and Affect: Mood normal.        Behavior: Behavior normal.     ED Results / Procedures / Treatments   Labs (all labs ordered are listed, but only abnormal results are displayed) Labs Reviewed  CBC WITH DIFFERENTIAL/PLATELET - Abnormal; Notable for the following components:      Result Value   Hemoglobin 11.6 (*)    All other components within normal limits  COMPREHENSIVE METABOLIC PANEL - Abnormal; Notable for the following components:   Glucose, Bld 105 (*)    Calcium 8.8 (*)    Total Protein 6.4 (*)    All other components within normal limits  D-DIMER, QUANTITATIVE - Abnormal;  Notable for the following components:   D-Dimer, Quant 0.70 (*)    All other components within normal limits  I-STAT VENOUS BLOOD GAS, ED - Abnormal; Notable for the following components:   pCO2, Ven 43.5 (*)    All other components within normal limits  CULTURE, BLOOD (ROUTINE X 2)  CULTURE, BLOOD (ROUTINE X 2)  RESP PANEL BY RT-PCR (FLU A&B, COVID) ARPGX2  LACTIC ACID, PLASMA  LACTIC ACID, PLASMA  PROCALCITONIN  LACTATE DEHYDROGENASE  FERRITIN  TRIGLYCERIDES  FIBRINOGEN  C-REACTIVE PROTEIN  TROPONIN I (HIGH SENSITIVITY)    EKG EKG Interpretation  Date/Time:  Tuesday May 01 2020 15:40:26 EST Ventricular Rate:  56 PR Interval:    QRS Duration: 109 QT Interval:  419 QTC Calculation: 405 R Axis:   57 Text Interpretation: Sinus rhythm Low voltage, precordial leads Abnormal inferior Q waves No significant change since last tracing Confirmed by Wandra Arthurs [phone]) on 05/01/2020 4:00:54 PM   Radiology DG Chest Port 1 View  Result Date: 05/01/2020 CLINICAL DATA:  Shortness of breath, COVID positive on 04/23/2020, improving but now with worsening shortness of breath. EXAM: PORTABLE CHEST 1 VIEW COMPARISON:  September 12, 2019 FINDINGS: EKG leads project over the chest. Minimal airspace disease at the lung bases. Cardiomediastinal contours and hilar structures are stable. Mild cardiac enlargement and aortic atherosclerosis as before. On limited assessment no acute skeletal process. IMPRESSION: Mild atelectasis, no consolidation or effusion. Electronically Signed   By: Zetta Bills M.D.   On: 05/01/2020 16:04    Procedures Procedures   Medications Ordered in ED Medications  dexamethasone (DECADRON) injection 10 mg (10 mg Intravenous Given 05/01/20 1626)  albuterol (VENTOLIN HFA) 108 (90 Base) MCG/ACT inhaler 2 puff (2 puffs Inhalation Given by Other 05/01/20 1626)    ED Course  I have reviewed the triage vital signs and the nursing notes.  Pertinent labs & imaging results that  were available during my care of the patient were reviewed by me and considered in my medical decision making (see chart for details).    MDM Rules/Calculators/A&P                          SHAVONNE AMBROISE is a 80 y.o. female here presenting with shortness of breath.  Patient was diagnosed with COVID about a week ago and received medical antibiotic infusion.  Consider Covid infection versus COPD exacerbation.  Will get [place] preadmission labs and ambulate patient.  Patient does have oxygen that she uses at night and her oxygen level is 100% right now.  We will get a chest x-ray to rule out pneumonia.  5:36 PM CBC, CMP unremarkable. CXR clear. D-dimer slightly elevated but considered normal if we age adjust it and it may be from [place]. No oxygen requirement and patient not tachycardic. Inflammatory markers pending. At this point, since she is doing well and vitals stable, she can be discharged with a course of steroids.   Final Clinical Impression(s) / ED Diagnoses Final diagnoses:  None    Rx / DC Orders ED Discharge Orders    None       Drenda Freeze, MD 05/01/20 1739

## 2020-05-01 NOTE — Discharge Instructions (Signed)
Take prednisone as prescribed.   Use albuterol every 4 hrs as needed for cough or shortness of breath   See your doctor for follow up in a week   Return to ER if you have worse trouble breathing, wheezing, cough

## 2020-05-01 NOTE — ED Notes (Signed)
Spoke with Altha Harm (Daughter) about patient status and plan

## 2020-05-06 LAB — CULTURE, BLOOD (ROUTINE X 2)
Culture: NO GROWTH
Culture: NO GROWTH
Special Requests: ADEQUATE
Special Requests: ADEQUATE

## 2020-05-10 ENCOUNTER — Other Ambulatory Visit: Payer: Self-pay | Admitting: Cardiology

## 2020-05-10 DIAGNOSIS — I209 Angina pectoris, unspecified: Secondary | ICD-10-CM

## 2020-06-11 DIAGNOSIS — R413 Other amnesia: Secondary | ICD-10-CM | POA: Diagnosis not present

## 2020-06-11 DIAGNOSIS — I10 Essential (primary) hypertension: Secondary | ICD-10-CM | POA: Diagnosis not present

## 2020-06-11 DIAGNOSIS — N182 Chronic kidney disease, stage 2 (mild): Secondary | ICD-10-CM | POA: Diagnosis not present

## 2020-06-11 DIAGNOSIS — D638 Anemia in other chronic diseases classified elsewhere: Secondary | ICD-10-CM | POA: Diagnosis not present

## 2020-06-11 DIAGNOSIS — E1159 Type 2 diabetes mellitus with other circulatory complications: Secondary | ICD-10-CM | POA: Diagnosis not present

## 2020-06-11 DIAGNOSIS — Z9981 Dependence on supplemental oxygen: Secondary | ICD-10-CM | POA: Diagnosis not present

## 2020-06-11 DIAGNOSIS — E039 Hypothyroidism, unspecified: Secondary | ICD-10-CM | POA: Diagnosis not present

## 2020-06-11 DIAGNOSIS — I872 Venous insufficiency (chronic) (peripheral): Secondary | ICD-10-CM | POA: Diagnosis not present

## 2020-07-19 DIAGNOSIS — H04123 Dry eye syndrome of bilateral lacrimal glands: Secondary | ICD-10-CM | POA: Diagnosis not present

## 2020-07-19 DIAGNOSIS — E119 Type 2 diabetes mellitus without complications: Secondary | ICD-10-CM | POA: Diagnosis not present

## 2020-07-19 DIAGNOSIS — H16223 Keratoconjunctivitis sicca, not specified as Sjogren's, bilateral: Secondary | ICD-10-CM | POA: Diagnosis not present

## 2020-07-19 DIAGNOSIS — H35033 Hypertensive retinopathy, bilateral: Secondary | ICD-10-CM | POA: Diagnosis not present

## 2020-08-28 ENCOUNTER — Other Ambulatory Visit: Payer: Self-pay | Admitting: Cardiology

## 2020-08-28 DIAGNOSIS — I209 Angina pectoris, unspecified: Secondary | ICD-10-CM

## 2020-09-06 DIAGNOSIS — D225 Melanocytic nevi of trunk: Secondary | ICD-10-CM | POA: Diagnosis not present

## 2020-09-06 DIAGNOSIS — L821 Other seborrheic keratosis: Secondary | ICD-10-CM | POA: Diagnosis not present

## 2020-09-06 DIAGNOSIS — Z85828 Personal history of other malignant neoplasm of skin: Secondary | ICD-10-CM | POA: Diagnosis not present

## 2020-09-06 DIAGNOSIS — C44619 Basal cell carcinoma of skin of left upper limb, including shoulder: Secondary | ICD-10-CM | POA: Diagnosis not present

## 2020-09-06 DIAGNOSIS — C44612 Basal cell carcinoma of skin of right upper limb, including shoulder: Secondary | ICD-10-CM | POA: Diagnosis not present

## 2020-09-06 DIAGNOSIS — L723 Sebaceous cyst: Secondary | ICD-10-CM | POA: Diagnosis not present

## 2020-09-06 DIAGNOSIS — D485 Neoplasm of uncertain behavior of skin: Secondary | ICD-10-CM | POA: Diagnosis not present

## 2020-09-06 DIAGNOSIS — L814 Other melanin hyperpigmentation: Secondary | ICD-10-CM | POA: Diagnosis not present

## 2020-09-06 DIAGNOSIS — D2262 Melanocytic nevi of left upper limb, including shoulder: Secondary | ICD-10-CM | POA: Diagnosis not present

## 2020-10-06 DIAGNOSIS — I10 Essential (primary) hypertension: Secondary | ICD-10-CM | POA: Diagnosis not present

## 2020-10-06 DIAGNOSIS — I25119 Atherosclerotic heart disease of native coronary artery with unspecified angina pectoris: Secondary | ICD-10-CM | POA: Diagnosis not present

## 2020-10-08 DIAGNOSIS — E1159 Type 2 diabetes mellitus with other circulatory complications: Secondary | ICD-10-CM | POA: Diagnosis not present

## 2020-10-08 DIAGNOSIS — E039 Hypothyroidism, unspecified: Secondary | ICD-10-CM | POA: Diagnosis not present

## 2020-10-08 DIAGNOSIS — E785 Hyperlipidemia, unspecified: Secondary | ICD-10-CM | POA: Diagnosis not present

## 2020-10-15 DIAGNOSIS — R413 Other amnesia: Secondary | ICD-10-CM | POA: Diagnosis not present

## 2020-10-15 DIAGNOSIS — E1159 Type 2 diabetes mellitus with other circulatory complications: Secondary | ICD-10-CM | POA: Diagnosis not present

## 2020-10-15 DIAGNOSIS — K219 Gastro-esophageal reflux disease without esophagitis: Secondary | ICD-10-CM | POA: Diagnosis not present

## 2020-10-15 DIAGNOSIS — R82998 Other abnormal findings in urine: Secondary | ICD-10-CM | POA: Diagnosis not present

## 2020-10-15 DIAGNOSIS — E785 Hyperlipidemia, unspecified: Secondary | ICD-10-CM | POA: Diagnosis not present

## 2020-10-15 DIAGNOSIS — E039 Hypothyroidism, unspecified: Secondary | ICD-10-CM | POA: Diagnosis not present

## 2020-10-15 DIAGNOSIS — I10 Essential (primary) hypertension: Secondary | ICD-10-CM | POA: Diagnosis not present

## 2020-10-15 DIAGNOSIS — D638 Anemia in other chronic diseases classified elsewhere: Secondary | ICD-10-CM | POA: Diagnosis not present

## 2020-10-15 DIAGNOSIS — Z0000 Encounter for general adult medical examination without abnormal findings: Secondary | ICD-10-CM | POA: Diagnosis not present

## 2020-10-15 DIAGNOSIS — E1129 Type 2 diabetes mellitus with other diabetic kidney complication: Secondary | ICD-10-CM | POA: Diagnosis not present

## 2020-10-15 DIAGNOSIS — Z1339 Encounter for screening examination for other mental health and behavioral disorders: Secondary | ICD-10-CM | POA: Diagnosis not present

## 2020-10-15 DIAGNOSIS — Z1331 Encounter for screening for depression: Secondary | ICD-10-CM | POA: Diagnosis not present

## 2020-11-06 DIAGNOSIS — I10 Essential (primary) hypertension: Secondary | ICD-10-CM | POA: Diagnosis not present

## 2020-11-06 DIAGNOSIS — I25119 Atherosclerotic heart disease of native coronary artery with unspecified angina pectoris: Secondary | ICD-10-CM | POA: Diagnosis not present

## 2020-12-07 DIAGNOSIS — I10 Essential (primary) hypertension: Secondary | ICD-10-CM | POA: Diagnosis not present

## 2020-12-07 DIAGNOSIS — I25119 Atherosclerotic heart disease of native coronary artery with unspecified angina pectoris: Secondary | ICD-10-CM | POA: Diagnosis not present

## 2021-01-04 ENCOUNTER — Other Ambulatory Visit: Payer: Self-pay | Admitting: Cardiology

## 2021-01-04 DIAGNOSIS — I209 Angina pectoris, unspecified: Secondary | ICD-10-CM

## 2021-01-06 DIAGNOSIS — I25119 Atherosclerotic heart disease of native coronary artery with unspecified angina pectoris: Secondary | ICD-10-CM | POA: Diagnosis not present

## 2021-01-06 DIAGNOSIS — I10 Essential (primary) hypertension: Secondary | ICD-10-CM | POA: Diagnosis not present

## 2021-02-05 ENCOUNTER — Other Ambulatory Visit: Payer: Self-pay | Admitting: Cardiology

## 2021-02-05 DIAGNOSIS — I209 Angina pectoris, unspecified: Secondary | ICD-10-CM

## 2021-02-11 DIAGNOSIS — N182 Chronic kidney disease, stage 2 (mild): Secondary | ICD-10-CM | POA: Diagnosis not present

## 2021-02-11 DIAGNOSIS — Z9981 Dependence on supplemental oxygen: Secondary | ICD-10-CM | POA: Diagnosis not present

## 2021-02-11 DIAGNOSIS — M545 Low back pain, unspecified: Secondary | ICD-10-CM | POA: Diagnosis not present

## 2021-02-11 DIAGNOSIS — E1129 Type 2 diabetes mellitus with other diabetic kidney complication: Secondary | ICD-10-CM | POA: Diagnosis not present

## 2021-02-11 DIAGNOSIS — I130 Hypertensive heart and chronic kidney disease with heart failure and stage 1 through stage 4 chronic kidney disease, or unspecified chronic kidney disease: Secondary | ICD-10-CM | POA: Diagnosis not present

## 2021-02-11 DIAGNOSIS — Z23 Encounter for immunization: Secondary | ICD-10-CM | POA: Diagnosis not present

## 2021-02-11 DIAGNOSIS — J449 Chronic obstructive pulmonary disease, unspecified: Secondary | ICD-10-CM | POA: Diagnosis not present

## 2021-03-06 ENCOUNTER — Other Ambulatory Visit: Payer: Self-pay | Admitting: Cardiology

## 2021-03-06 DIAGNOSIS — I209 Angina pectoris, unspecified: Secondary | ICD-10-CM

## 2021-03-11 ENCOUNTER — Other Ambulatory Visit: Payer: Medicare PPO

## 2021-03-12 ENCOUNTER — Other Ambulatory Visit: Payer: Self-pay

## 2021-03-12 ENCOUNTER — Ambulatory Visit: Payer: Medicare PPO

## 2021-03-12 DIAGNOSIS — I6521 Occlusion and stenosis of right carotid artery: Secondary | ICD-10-CM | POA: Diagnosis not present

## 2021-03-18 ENCOUNTER — Other Ambulatory Visit: Payer: Self-pay

## 2021-03-18 ENCOUNTER — Encounter: Payer: Self-pay | Admitting: Cardiology

## 2021-03-18 ENCOUNTER — Ambulatory Visit: Payer: Medicare PPO | Admitting: Cardiology

## 2021-03-18 VITALS — BP 110/71 | HR 57 | Temp 98.0°F | Resp 16 | Ht 64.0 in | Wt 184.6 lb

## 2021-03-18 DIAGNOSIS — I25118 Atherosclerotic heart disease of native coronary artery with other forms of angina pectoris: Secondary | ICD-10-CM

## 2021-03-18 DIAGNOSIS — R5383 Other fatigue: Secondary | ICD-10-CM | POA: Diagnosis not present

## 2021-03-18 DIAGNOSIS — R0609 Other forms of dyspnea: Secondary | ICD-10-CM

## 2021-03-18 NOTE — Progress Notes (Signed)
Primary Physician/Referring:  Burnard Bunting, MD  Patient ID: Desiree Leon, female    DOB: [birth year], 81 y.o.   MRN: [id number]  Chief Complaint  Patient presents with   Coronary Artery Disease   Hypertension   Follow-up    1 year   HPI:    Desiree Leon  is a 81 y.o. Caucasian female  follow-up of coronary artery disease h/o NSTEMI on 04/18/2014 S/P  balloon angioplasty of the LAD and D2 with 80% residual stenosis which was not amenable to further intervention due to the LAD being a small artery. and mild peripheral arterial disease, left leg sciatica and chronic back pain, hypertension and hyperlipidemia presents for annual visit.   Last visit patient completed a year of Eliquis, therefore discontinued it his PE was likely related to immobility with severe pneumonia, particularly in the context of patient's history of GI bleed.  Patient was switched back to aspirin 81 mg daily for underlying CAD.  Following last office visit ordered repeat echocardiogram which noted LVEF 50-55% with grade 2 diastolic dysfunction, moderate MR, mild to moderate TR, and moderate pulmonary hypertension.  Notably recent carotid artery surveillance showed that disease had regressed.  Patient is accompanied by her daughter at today's office visit.  Both expressed that over the last 6 months patient has noticed reduced exercise tolerance, dyspnea on exertion, and occasional episodes of chest pain particularly when doing household chores.  Her daughter expresses concern that she requires frequent rest while grocery shopping now, which is new in the last several months and progressively worsening.  Patient states she has not taken nitroglycerin since last office visit.  Denies syncope, near syncope, orthopnea, PND, leg edema.  Denies melena.  Past Medical History:  Diagnosis Date   Anxiety    Blood transfusion    CAP (community acquired pneumonia)    COPD (chronic obstructive pulmonary disease) ([place])     Heart murmur    History of colon polyps    Hyperlipidemia    MI (myocardial infarction) ([place])    Mitral valve prolapse    NSTEMI (non-ST elevated myocardial infarction) ([place])    Osteoarthritis    Osteoporosis    Pulmonary emboli ([place]) 04/12/2019   Spinal stenosis    Thyroid disease    hypothyroidism   Unstable angina ([place]) 05/08/2014   Venous insufficiency    Family History  Problem Relation Age of Onset   Heart disease Mother    Heart disease Father    Cancer Sister        breast   Cancer Daughter        cervical and bone   Heart disease Sister    Thyroid disease Sister    Past Surgical History:  Procedure Laterality Date   ABDOMINAL HYSTERECTOMY  11/26/1976   APPENDECTOMY  02/14/1978   BASAL CELL CARCINOMA EXCISION  01/26/1990   COLONOSCOPY  1993, 2003, 2008, 2012   DILATION AND CURETTAGE OF UTERUS  09/1976   LEFT HEART CATHETERIZATION WITH CORONARY ANGIOGRAM N/A 04/18/2014   Procedure: LEFT HEART CATHETERIZATION WITH CORONARY ANGIOGRAM;  Surgeon: Peter M Martinique, MD;  Location: [facility] C[facility] LAB;  Service: Cardiovascular;  Laterality: N/A;   MASS EXCISION  10/20/2011   Procedure: MINOR EXCISION OF MASS;  Surgeon: Adin Hector, MD;  Location: [place];  Service: General;  Laterality: N/A;  Excision mass posterior neck   OOPHORECTOMY  02/14/1978   SHOULDER SURGERY  10/18/2007   basal cell   SQUAMOUS CELL CARCINOMA  [place]   TUBAL LIGATION  07/08/74   Social History   Tobacco Use   Smoking status: Former    Types: Cigarettes    Quit date: 03/10/2005    Years since quitting: 16.0   Smokeless tobacco: Never  Substance Use Topics   Alcohol use: No  Marital Status: Widowed    ROS  Review of Systems  Cardiovascular:  Positive for chest pain, dyspnea on exertion and palpitations. Negative for leg swelling.  Musculoskeletal:  Positive for arthritis and back pain (chronic).  Gastrointestinal:  Negative for abdominal pain, change in bowel  habit and melena.  Objective  Blood pressure 110/71, pulse (!) 57, temperature 98 F (36.7 C), temperature source Temporal, resp. rate 16, height '5\' 4"'  (1.626 m), weight 184 lb 9.6 oz (83.7 kg), SpO2 97 %. Body mass index is 31.69 kg/m.  Vitals with BMI 03/18/2021 05/01/2020 05/01/2020  Height '5\' 4"'  - -  Weight 184 lbs 10 oz - -  BMI 65.53 - -  Systolic 748 270 786  Diastolic 71 62 74  Pulse 57 56 54     Physical Exam Vitals reviewed.  Constitutional:      Comments: Moderately built and mildly obese in no acute distress.   Neck:     Thyroid: No thyromegaly.  Cardiovascular:     Rate and Rhythm: Normal rate and regular rhythm.     Pulses:          Carotid pulses are  on the right side with bruit.      Popliteal pulses are 2+ on the right side and 2+ on the left side.       Dorsalis pedis pulses are 0 on the right side and 0 on the left side.       Posterior tibial pulses are 2+ on the right side and 2+ on the left side.     Heart sounds: Normal heart sounds. No murmur heard.   No gallop.     Comments: No JVD. Pulmonary:     Effort: Pulmonary effort is normal.     Breath sounds: Normal breath sounds.  Musculoskeletal:        General: Normal range of motion.     Cervical back: Neck supple.     Right lower leg: No edema.     Left lower leg: No edema.  Skin:    General: Skin is warm and dry.  Neurological:     Mental Status: She is alert.   Laboratory examination:   CMP Latest Ref Rng & Units 05/01/2020 05/01/2020 04/12/2019  Glucose 70 - 99 mg/dL - 105(H) 106(H)  BUN 8 - 23 mg/dL - 16 17  Creatinine 0.44 - 1.00 mg/dL - 0.61 0.78  Sodium 135 - 145 mmol/L 139 138 137  Potassium 3.5 - 5.1 mmol/L 3.7 3.7 3.7  Chloride 98 - 111 mmol/L - 104 106  CO2 22 - 32 mmol/L - 25 23  Calcium 8.9 - 10.3 mg/dL - 8.8(L) 8.7(L)  Total Protein 6.5 - 8.1 g/dL - 6.4(L) -  Total Bilirubin 0.3 - 1.2 mg/dL - 0.3 -  Alkaline Phos 38 - 126 U/L - 79 -  AST 15 - 41 U/L - 18 -  ALT 0 - 44 U/L - 16 -    CBC Latest Ref Rng & Units 05/01/2020 05/01/2020 04/12/2019  WBC 4.0 - 10.5 K/uL - 6.8 9.6  Hemoglobin 12.0 - 15.0 g/dL 12.2 11.6(L) 11.5(L)  Hematocrit 36.0 -  46.0 % 36.0 36.0 34.9(L)  Platelets 150 - 400 K/uL - 266 173   Lipid Panel     Component Value Date/Time   CHOL 125 04/18/2014 0600   TRIG 206 (H) 05/01/2020 1547   HDL 38 (L) 04/18/2014 0600   CHOLHDL 3.3 04/18/2014 0600   VLDL 13 04/18/2014 0600   LDLCALC 74 04/18/2014 0600   HEMOGLOBIN A1C Lab Results  Component Value Date   HGBA1C 6.0 (H) 04/20/2014   MPG 126 04/20/2014   TSH No results for input(s): TSH in the last 8760 hours.   External Labs: 01/07/2018: Hemoglobin A1c 5.3%.  12/31/2017: Creatinine 0.7, EGFR 80/97, potassium 4.6, CMP normal.  CBC normal.  Cholesterol 174, triglycerides 124, HDL 40, LDL 109.  TSH low at 0.2.  Free T4 normal at 1.4. Allergies   Allergies  Allergen Reactions   Other Anaphylaxis and Cough    fragarances-perfumes   Dilaudid [Hydromorphone Hcl] Nausea Only   Erythromycin Other (See Comments)    Medications Prior to Visit:   Outpatient Medications Prior to Visit  Medication Sig Dispense Refill   ACCU-CHEK SMARTVIEW test strip      acetaminophen (TYLENOL) 500 MG tablet Take 500 mg by mouth every 6 (six) hours as needed for fever or headache.      ALPRAZolam (XANAX) 0.25 MG tablet Take 0.125 mg by mouth at bedtime as needed for anxiety.      aspirin (ASPIRIN CHILDRENS) 81 MG chewable tablet Chew 1 tablet (81 mg total) by mouth daily.     budesonide-formoterol (SYMBICORT) 80-4.5 MCG/ACT inhaler Inhale 2 puffs into the lungs 2 (two) times daily.     cholecalciferol (VITAMIN D) 25 MCG (1000 UNIT) tablet 2 capsules.     Elastic Bandages & Supports (O-2 SUSPENSORY) MISC 8 hrs every night     escitalopram (LEXAPRO) 5 MG tablet Take 5 mg by mouth every evening.      glucose blood test strip Accu-Chek SmartView Test Strips  CHECK BLOOD SUGAR TWICE DAILY.     iron polysaccharides  (NIFEREX) 150 MG capsule Take 150 mg by mouth daily.     Levothyroxine Sodium 137 MCG CAPS Take 137 mcg by mouth daily before breakfast.      methocarbamol (ROBAXIN) 500 MG tablet 1 tab TID prn muscle spasms     metoprolol tartrate (LOPRESSOR) 25 MG tablet Take 25 mg by mouth 2 (two) times daily.      multivitamin-iron-minerals-folic acid (CENTRUM) chewable tablet Chew 1 tablet by mouth daily.     niCARdipine (CARDENE) 20 MG capsule Take 20 mg by mouth every evening.      nitroGLYCERIN (NITRODUR - DOSED IN MG/24 HR) 0.4 mg/hr patch APPLY ONE PATCH ONCE DAILY AS DIRECTED 30 patch 3   nitroGLYCERIN (NITROSTAT) 0.4 MG SL tablet Place 0.4 mg under the tongue every 5 (five) minutes as needed for chest pain.     OXYGEN Inhale 2 L into the lungs at bedtime.     pantoprazole (PROTONIX) 40 MG tablet Take 1 tablet (40 mg total) by mouth daily at 12 noon. (Patient taking differently: Take 40 mg by mouth 2 (two) times daily.) 30 tablet 1   Polyethyl Glycol-Propyl Glycol (SYSTANE) 0.4-0.3 % SOLN Place 1 drop into both eyes See admin instructions. Once to twice daily as needed for dry eyes     Probiotic Product (ALIGN) 4 MG CAPS Take 4 mg by mouth daily.     ranolazine (RANEXA) 500 MG 12 hr tablet TAKE 1 TABLET EVERY 12  HOURS. 60 tablet 11   VYTORIN 10-20 MG per tablet Take 1 tablet by mouth daily at 6 PM.      albuterol (VENTOLIN HFA) 108 (90 Base) MCG/ACT inhaler Inhale 1-2 puffs into the lungs every 4 (four) hours as needed for shortness of breath or wheezing.     apixaban (ELIQUIS) 5 MG TABS tablet Take 1 tablet by mouth 2 (two) times daily.     cefdinir (OMNICEF) 300 MG capsule See admin instructions.     Cholecalciferol 25 MCG (1000 UT) CHEW Chew 2,000 Units by mouth daily.      Probiotic Product (ALIGN PO) Align     SYMBICORT 80-4.5 MCG/ACT inhaler      No facility-administered medications prior to visit.   Final Medications at End of Visit    Current Meds  Medication Sig   ACCU-CHEK SMARTVIEW  test strip    acetaminophen (TYLENOL) 500 MG tablet Take 500 mg by mouth every 6 (six) hours as needed for fever or headache.    ALPRAZolam (XANAX) 0.25 MG tablet Take 0.125 mg by mouth at bedtime as needed for anxiety.    aspirin (ASPIRIN CHILDRENS) 81 MG chewable tablet Chew 1 tablet (81 mg total) by mouth daily.   budesonide-formoterol (SYMBICORT) 80-4.5 MCG/ACT inhaler Inhale 2 puffs into the lungs 2 (two) times daily.   cholecalciferol (VITAMIN D) 25 MCG (1000 UNIT) tablet 2 capsules.   Elastic Bandages & Supports (O-2 SUSPENSORY) MISC 8 hrs every night   escitalopram (LEXAPRO) 5 MG tablet Take 5 mg by mouth every evening.    glucose blood test strip Accu-Chek SmartView Test Strips  CHECK BLOOD SUGAR TWICE DAILY.   iron polysaccharides (NIFEREX) 150 MG capsule Take 150 mg by mouth daily.   Levothyroxine Sodium 137 MCG CAPS Take 137 mcg by mouth daily before breakfast.    methocarbamol (ROBAXIN) 500 MG tablet 1 tab TID prn muscle spasms   metoprolol tartrate (LOPRESSOR) 25 MG tablet Take 25 mg by mouth 2 (two) times daily.    multivitamin-iron-minerals-folic acid (CENTRUM) chewable tablet Chew 1 tablet by mouth daily.   niCARdipine (CARDENE) 20 MG capsule Take 20 mg by mouth every evening.    nitroGLYCERIN (NITRODUR - DOSED IN MG/24 HR) 0.4 mg/hr patch APPLY ONE PATCH ONCE DAILY AS DIRECTED   nitroGLYCERIN (NITROSTAT) 0.4 MG SL tablet Place 0.4 mg under the tongue every 5 (five) minutes as needed for chest pain.   OXYGEN Inhale 2 L into the lungs at bedtime.   pantoprazole (PROTONIX) 40 MG tablet Take 1 tablet (40 mg total) by mouth daily at 12 noon. (Patient taking differently: Take 40 mg by mouth 2 (two) times daily.)   Polyethyl Glycol-Propyl Glycol (SYSTANE) 0.4-0.3 % SOLN Place 1 drop into both eyes See admin instructions. Once to twice daily as needed for dry eyes   Probiotic Product (ALIGN) 4 MG CAPS Take 4 mg by mouth daily.   ranolazine (RANEXA) 500 MG 12 hr tablet TAKE 1 TABLET  EVERY 12 HOURS.   VYTORIN 10-20 MG per tablet Take 1 tablet by mouth daily at 6 PM.    Radiology:   CT angiogram chest 04/11/2019: Cardiovascular: There are 2 small peripheral pulmonary emboli in the right lower lobe. No other pulmonary emboli. Aortic atherosclerosis. RV LV ratio is normal. Mild cardiomegaly. No pericardial effusion.  Cardiac Studies:   Coronary angiogram 04/18/2014:  PTCA and suboptimal balloon angioplasty of mid LAD and D2.  Residual 80% stenosis in the distal LAD not amenable to intervention.  LAD collateralized by RCA. Normal LVEF.  Echo- 05/03/14 1. Left ventricle cavity is normal in size. Mild concentric hypertrophy of the left ventricle. Normal global wall motion. Calculated EF 63%. 2. Mild mitral regurgitation. 3. Mild tricuspid regurgitation. No evidence of pulmonary hypertension  Exercise myoview stress 08/11/2014: 1. The resting electrocardiogram demonstrated normal sinus rhythm, normal resting conduction, no resting arrhythmias and normal rest repolarization.  The stress electrocardiogram was normal.   The patient performed treadmill exercise using a Bruce protocol, completing 5:30 minutes.The patient completed an estimated workload of 7.05 METS, 88% of the maximum predicted heart rate.  The stress test was terminated because of fatigue and THR met. 2. Myocardial perfusion imaging is normal. Overall left ventricular systolic function was normal without regional wall motion abnormalities. The left ventricular ejection fraction was 75%.  PCV ECHOCARDIOGRAM COMPLETE 04/10/2020 Poor visualization of LV endocardium. Left ventricle cavity is normal in size. Left ventricle cavity is normal in size. Moderate concentric hypertrophy of the left ventricle. Normal global wall motion. Normal LV systolic function with visual EF 50-55%. Doppler evidence of grade II (pseudonormal) diastolic dysfunction, elevated LAP. Left atrial cavity is mildly dilated. Moderate (Grade II)  mitral regurgitation. Mildly restricted mitral valve leaflets. Mild to moderate tricuspid regurgitation. Estimated pulmonary artery systolic pressure 40 mmHg. Diastolic dysfunction, pulmonary hypertension new compared to previous study in 2016.   Carotid artery duplex 03/12/2020:  Duplex suggests stenosis in the right internal carotid artery (1-15%).  Duplex suggests stenosis in the left internal carotid artery (minimal).  Antegrade right vertebral artery flow. Antegrade left vertebral artery flow.  Compared to the study done on 04/10/2020, right ECA stenosis of 15 to 49% now appears to have regressed.  EKG: 03/18/2021: Sinus bradycardia rate of 53 bpm.  Normal axis.  No evidence of ischemia or underlying injury pattern.  Compared EKG 03/16/2020, no significant change.  Assessment     ICD-10-CM   1. Coronary artery disease of native artery of native heart with stable angina pectoris ([place])  I25.118 EKG 12-Lead      No orders of the defined types were placed in this encounter.  Medications Discontinued During This Encounter  Medication Reason   albuterol (VENTOLIN HFA) 108 (90 Base) MCG/ACT inhaler    apixaban (ELIQUIS) 5 MG TABS tablet    cefdinir (OMNICEF) 300 MG capsule    Cholecalciferol 25 MCG (1000 UT) CHEW    Orders Placed This Encounter  Procedures   EKG 12-Lead       Recommendations:    Desiree Leon  is a 81 y.o. Caucasian female  follow-up of coronary artery disease h/o NSTEMI on 04/18/2014 S/P  balloon angioplasty of the LAD and D2 with 80% residual stenosis which was not amenable to further intervention due to the LAD being a small artery. and mild peripheral arterial disease, left leg sciatica and chronic back pain, hypertension and hyperlipidemia presents for annual visit.    Reviewed and discussed with patient results of carotid artery surveillance as well as echocardiogram, details above.  In regard to carotid artery disease recent surveillance shows disease has  regressed.  Therefore shared decision was to discontinue carotid artery surveillance given impression of disease which is now minimal and patient's advanced age.Continue aspirin and Vytorin.  Regard to echocardiogram, we will plan to monitor valvular disease.  Pulmonary hypertension is likely related to underlying lung disease.  Given patient's progressive dyspnea and reduced exercise tolerance as well as occasional angina recommend further evaluation at this time.Will obtain BNP, CBC,  TSH, and BMP.  We will also obtain repeat echocardiogram.  Discussed with patient and her daughter recommendation of ischemic evaluation, however patient is very resistant to repeat stress test.  Shared decision was to proceed with labs and echocardiogram and determine need for further ischemic evaluation pending these results.  Counseled patient regarding signs and symptoms that would warrant urgent or emergent evaluation, she verbalized understanding agreement.  Blood pressure is well controlled.  Not make changes to medications at this time.  Follow-up in 4 weeks, sooner if needed, for dyspnea on exertion, CAD, and results of cardiac testing.   Alethia Berthold, PA-C 03/18/2021, 4:05 PM Office: [phone]

## 2021-03-19 LAB — TSH: TSH: 0.834 u[IU]/mL (ref 0.450–4.500)

## 2021-03-19 LAB — BASIC METABOLIC PANEL
BUN/Creatinine Ratio: 20 (ref 12–28)
BUN: 15 mg/dL (ref 8–27)
CO2: 22 mmol/L (ref 20–29)
Calcium: 8.8 mg/dL (ref 8.7–10.3)
Chloride: 105 mmol/L (ref 96–106)
Creatinine, Ser: 0.76 mg/dL (ref 0.57–1.00)
Glucose: 97 mg/dL (ref 70–99)
Potassium: 5.1 mmol/L (ref 3.5–5.2)
Sodium: 141 mmol/L (ref 134–144)
eGFR: 79 mL/min/{1.73_m2} (ref >59)

## 2021-03-19 LAB — CBC
Hematocrit: 37.9 % (ref 34.0–46.6)
Hemoglobin: 12.7 g/dL (ref 11.1–15.9)
MCH: 32.4 pg (ref 26.6–33.0)
MCHC: 33.5 g/dL (ref 31.5–35.7)
MCV: 97 fL (ref 79–97)
Platelets: 245 10*3/uL (ref 150–450)
RBC: 3.92 x10E6/uL (ref 3.77–5.28)
RDW: 13.5 % (ref 11.7–15.4)
WBC: 8.3 10*3/uL (ref 3.4–10.8)

## 2021-03-19 LAB — BRAIN NATRIURETIC PEPTIDE: BNP: 115.8 pg/mL — ABNORMAL HIGH (ref 0.0–100.0)

## 2021-03-21 ENCOUNTER — Other Ambulatory Visit: Payer: Self-pay

## 2021-03-21 ENCOUNTER — Ambulatory Visit: Payer: Medicare PPO

## 2021-03-21 DIAGNOSIS — R0609 Other forms of dyspnea: Secondary | ICD-10-CM | POA: Diagnosis not present

## 2021-04-15 ENCOUNTER — Encounter: Payer: Self-pay | Admitting: Student

## 2021-04-15 ENCOUNTER — Other Ambulatory Visit: Payer: Self-pay

## 2021-04-15 ENCOUNTER — Ambulatory Visit: Payer: Medicare PPO | Admitting: Student

## 2021-04-15 VITALS — BP 135/61 | HR 58 | Temp 98.3°F | Ht 64.0 in | Wt 182.0 lb

## 2021-04-15 DIAGNOSIS — I25118 Atherosclerotic heart disease of native coronary artery with other forms of angina pectoris: Secondary | ICD-10-CM | POA: Diagnosis not present

## 2021-04-15 DIAGNOSIS — R0609 Other forms of dyspnea: Secondary | ICD-10-CM | POA: Diagnosis not present

## 2021-04-15 NOTE — Progress Notes (Signed)
Primary Physician/Referring:  Burnard Bunting, MD  Patient ID: Burlene Arnt, female    DOB: 06-01-1940, 81 y.o.   MRN: [id number]  Chief Complaint  Patient presents with   Coronary Artery Disease   Shortness of Breath   Follow-up   Results   HPI:    Desiree Leon  is a 81 y.o. Caucasian female  follow-up of coronary artery disease h/o NSTEMI on 04/18/2014 S/P  balloon angioplasty of the LAD and D2 with 80% residual stenosis which was not amenable to further intervention due to the LAD being a small artery. and mild peripheral arterial disease, left leg sciatica and chronic back pain, hypertension and hyperlipidemia.   After completing a year of Eliquis, it was discontinued it his PE was likely related to immobility with severe pneumonia, particularly in the context of patient's history of GI bleed.  Patient was switched back to aspirin 81 mg daily for underlying CAD.   Patient presents for 4-week follow-up.  Last office visit carotid artery disease had progressed, therefore no further surveillance indicated.  Given dyspnea and reduced exercise tolerance at last visit ordered repeat echocardiogram, laboratory testing, however patient opted to hold off on further ischemic evaluation.  Echocardiogram revealed normal LVEF, diastolic dysfunction MR and mild pulmonary hypertension have resolved compared to 04/2020. Labs were relatively unremarkable with the exception of minimally elevated BNP.   Patient is accompanied by her daughter at today's office visit.  She has had no recurrence of chest pain, however she does continue to have dyspnea on exertion and reduced exercise tolerance unchanged from last office visit.  Past Medical History:  Diagnosis Date   Anxiety    Blood transfusion    CAP (community acquired pneumonia)    COPD (chronic obstructive pulmonary disease) ([place])    Heart murmur    History of colon polyps    Hyperlipidemia    MI (myocardial infarction) ([place])    Mitral valve  prolapse    NSTEMI (non-ST elevated myocardial infarction) ([place])    Osteoarthritis    Osteoporosis    Pulmonary emboli ([place]) 04/12/2019   Spinal stenosis    Thyroid disease    hypothyroidism   Unstable angina ([place]) 05/08/2014   Venous insufficiency    Family History  Problem Relation Age of Onset   Heart disease Mother    Heart disease Father    Cancer Sister        breast   Cancer Daughter        cervical and bone   Heart disease Sister    Thyroid disease Sister    Past Surgical History:  Procedure Laterality Date   ABDOMINAL HYSTERECTOMY  11/26/1976   APPENDECTOMY  02/14/1978   BASAL CELL CARCINOMA EXCISION  01/26/1990   COLONOSCOPY  1993, 2003, 2008, 2012   DILATION AND CURETTAGE OF UTERUS  09/1976   LEFT HEART CATHETERIZATION WITH CORONARY ANGIOGRAM N/A 04/18/2014   Procedure: LEFT HEART CATHETERIZATION WITH CORONARY ANGIOGRAM;  Surgeon: Peter M Martinique, MD;  Location: [facility] CATH LAB;  Service: Cardiovascular;  Laterality: N/A;   MASS EXCISION  10/20/2011   Procedure: MINOR EXCISION OF MASS;  Surgeon: Adin Hector, MD;  Location: [facility];  Service: General;  Laterality: N/A;  Excision mass posterior neck   OOPHORECTOMY  02/14/1978   SHOULDER SURGERY  10/18/2007   basal cell   SQUAMOUS CELL CARCINOMA EXCISION     TONSILLECTOMY  1955   TUBAL LIGATION  07/08/74   Social History  Tobacco Use   Smoking status: Former    Types: Cigarettes    Quit date: 03/10/2005    Years since quitting: 16.1   Smokeless tobacco: Never  Substance Use Topics   Alcohol use: No  Marital Status: Widowed    ROS  Review of Systems  Cardiovascular:  Positive for dyspnea on exertion. Negative for chest pain, claudication, leg swelling, near-syncope, palpitations and syncope.  Musculoskeletal:  Positive for arthritis and back pain (chronic).  Gastrointestinal:  Negative for abdominal pain, change in bowel habit and melena.  Objective  Blood pressure 135/61, pulse (!) 58,  temperature 98.3 F (36.8 C), temperature source Temporal, height $RemoveBeforeDE'5\' 4"'JHPoeOYticeKExY$  (1.626 m), weight 182 lb (82.6 kg), SpO2 97 %. Body mass index is 31.24 kg/m.  Vitals with BMI 04/15/2021 03/18/2021 05/01/2020  Height $Remov'5\' 4"'FrhjNO$  $Remove'5\' 4"'KAauVxr$  -  Weight 182 lbs 184 lbs 10 oz -  BMI 24.26 83.41 -  Systolic 962 229 798  Diastolic 61 71 62  Pulse 58 57 56     Physical Exam Vitals reviewed.  Constitutional:      Comments: Moderately built and mildly obese in no acute distress.   Neck:     Thyroid: No thyromegaly.  Cardiovascular:     Rate and Rhythm: Normal rate and regular rhythm.     Pulses:          Carotid pulses are  on the right side with bruit.      Popliteal pulses are 2+ on the right side and 2+ on the left side.       Dorsalis pedis pulses are 0 on the right side and 0 on the left side.       Posterior tibial pulses are 2+ on the right side and 2+ on the left side.     Heart sounds: Normal heart sounds. No murmur heard.   No gallop.     Comments: No JVD. Pulmonary:     Effort: Pulmonary effort is normal.     Breath sounds: Normal breath sounds.  Musculoskeletal:     Right lower leg: No edema.     Left lower leg: No edema.  Neurological:     Mental Status: She is alert.   Laboratory examination:   CMP Latest Ref Rng & Units 03/18/2021 05/01/2020 05/01/2020  Glucose 70 - 99 mg/dL 97 - 105(H)  BUN 8 - 27 mg/dL 15 - 16  Creatinine 0.57 - 1.00 mg/dL 0.76 - 0.61  Sodium 134 - 144 mmol/L 141 139 138  Potassium 3.5 - 5.2 mmol/L 5.1 3.7 3.7  Chloride 96 - 106 mmol/L 105 - 104  CO2 20 - 29 mmol/L 22 - 25  Calcium 8.7 - 10.3 mg/dL 8.8 - 8.8(L)  Total Protein 6.5 - 8.1 g/dL - - 6.4(L)  Total Bilirubin 0.3 - 1.2 mg/dL - - 0.3  Alkaline Phos 38 - 126 U/L - - 79  AST 15 - 41 U/L - - 18  ALT 0 - 44 U/L - - 16   CBC Latest Ref Rng & Units 03/18/2021 05/01/2020 05/01/2020  WBC 3.4 - 10.8 x10E3/uL 8.3 - 6.8  Hemoglobin 11.1 - 15.9 g/dL 12.7 12.2 11.6(L)  Hematocrit 34.0 - 46.6 % 37.9 36.0 36.0  Platelets 150  - 450 x10E3/uL 245 - 266   Lipid Panel     Component Value Date/Time   CHOL 125 04/18/2014 0600   TRIG 206 (H) 05/01/2020 1547   HDL 38 (L) 04/18/2014 0600   CHOLHDL 3.3 04/18/2014 0600  VLDL 13 04/18/2014 0600   LDLCALC 74 04/18/2014 0600   HEMOGLOBIN A1C Lab Results  Component Value Date   HGBA1C 6.0 (H) 04/20/2014   MPG 126 04/20/2014   TSH Recent Labs    03/18/21 1132  TSH 0.834     External Labs: 01/07/2018: Hemoglobin A1c 5.3%.  12/31/2017: Creatinine 0.7, EGFR 80/97, potassium 4.6, CMP normal.  CBC normal.  Cholesterol 174, triglycerides 124, HDL 40, LDL 109.  TSH low at 0.2.  Free T4 normal at 1.4. Allergies   Allergies  Allergen Reactions   Other Anaphylaxis and Cough    fragarances-perfumes   Dilaudid [Hydromorphone Hcl] Nausea Only   Erythromycin Other (See Comments)    Medications Prior to Visit:   Outpatient Medications Prior to Visit  Medication Sig Dispense Refill   ACCU-CHEK SMARTVIEW test strip      acetaminophen (TYLENOL) 500 MG tablet Take 500 mg by mouth every 6 (six) hours as needed for fever or headache.      ALPRAZolam (XANAX) 0.25 MG tablet Take 0.125 mg by mouth at bedtime as needed for anxiety.      aspirin (ASPIRIN CHILDRENS) 81 MG chewable tablet Chew 1 tablet (81 mg total) by mouth daily.     budesonide-formoterol (SYMBICORT) 80-4.5 MCG/ACT inhaler Inhale 2 puffs into the lungs 2 (two) times daily.     cholecalciferol (VITAMIN D) 25 MCG (1000 UNIT) tablet 2 capsules.     Elastic Bandages & Supports (O-2 SUSPENSORY) MISC 8 hrs every night     escitalopram (LEXAPRO) 5 MG tablet Take 5 mg by mouth every evening.      glucose blood test strip Accu-Chek SmartView Test Strips  CHECK BLOOD SUGAR TWICE DAILY.     iron polysaccharides (NIFEREX) 150 MG capsule Take 150 mg by mouth daily.     Levothyroxine Sodium 137 MCG CAPS Take 137 mcg by mouth daily before breakfast.      methocarbamol (ROBAXIN) 500 MG tablet 1 tab TID prn muscle spasms      metoprolol tartrate (LOPRESSOR) 25 MG tablet Take 25 mg by mouth 2 (two) times daily.      multivitamin-iron-minerals-folic acid (CENTRUM) chewable tablet Chew 1 tablet by mouth daily.     niCARdipine (CARDENE) 20 MG capsule Take 20 mg by mouth every evening.      nitroGLYCERIN (NITRODUR - DOSED IN MG/24 HR) 0.4 mg/hr patch APPLY ONE PATCH ONCE DAILY AS DIRECTED 30 patch 3   nitroGLYCERIN (NITROSTAT) 0.4 MG SL tablet Place 0.4 mg under the tongue every 5 (five) minutes as needed for chest pain.     OXYGEN Inhale 2 L into the lungs at bedtime.     pantoprazole (PROTONIX) 40 MG tablet Take 1 tablet (40 mg total) by mouth daily at 12 noon. (Patient taking differently: Take 40 mg by mouth 2 (two) times daily.) 30 tablet 1   Polyethyl Glycol-Propyl Glycol (SYSTANE) 0.4-0.3 % SOLN Place 1 drop into both eyes See admin instructions. Once to twice daily as needed for dry eyes     Probiotic Product (ALIGN) 4 MG CAPS Take 4 mg by mouth daily.     ranolazine (RANEXA) 500 MG 12 hr tablet TAKE 1 TABLET EVERY 12 HOURS. 60 tablet 11   VYTORIN 10-20 MG per tablet Take 1 tablet by mouth daily at 6 PM.      No facility-administered medications prior to visit.   Final Medications at End of Visit    Current Meds  Medication Sig   [place]  test strip    acetaminophen (TYLENOL) 500 MG tablet Take 500 mg by mouth every 6 (six) hours as needed for fever or headache.    ALPRAZolam (XANAX) 0.25 MG tablet Take 0.125 mg by mouth at bedtime as needed for anxiety.    aspirin (ASPIRIN CHILDRENS) 81 MG chewable tablet Chew 1 tablet (81 mg total) by mouth daily.   budesonide-formoterol (SYMBICORT) 80-4.5 MCG/ACT inhaler Inhale 2 puffs into the lungs 2 (two) times daily.   cholecalciferol (VITAMIN D) 25 MCG (1000 UNIT) tablet 2 capsules.   Elastic Bandages & Supports (O-2 SUSPENSORY) MISC 8 hrs every night   escitalopram (LEXAPRO) 5 MG tablet Take 5 mg by mouth every evening.    glucose blood test strip  Accu-Chek SmartView Test Strips  CHECK BLOOD SUGAR TWICE DAILY.   iron polysaccharides (NIFEREX) 150 MG capsule Take 150 mg by mouth daily.   Levothyroxine Sodium 137 MCG CAPS Take 137 mcg by mouth daily before breakfast.    methocarbamol (ROBAXIN) 500 MG tablet 1 tab TID prn muscle spasms   metoprolol tartrate (LOPRESSOR) 25 MG tablet Take 25 mg by mouth 2 (two) times daily.    multivitamin-iron-minerals-folic acid (CENTRUM) chewable tablet Chew 1 tablet by mouth daily.   niCARdipine (CARDENE) 20 MG capsule Take 20 mg by mouth every evening.    nitroGLYCERIN (NITRODUR - DOSED IN MG/24 HR) 0.4 mg/hr patch APPLY ONE PATCH ONCE DAILY AS DIRECTED   nitroGLYCERIN (NITROSTAT) 0.4 MG SL tablet Place 0.4 mg under the tongue every 5 (five) minutes as needed for chest pain.   OXYGEN Inhale 2 L into the lungs at bedtime.   pantoprazole (PROTONIX) 40 MG tablet Take 1 tablet (40 mg total) by mouth daily at 12 noon. (Patient taking differently: Take 40 mg by mouth 2 (two) times daily.)   Polyethyl Glycol-Propyl Glycol (SYSTANE) 0.4-0.3 % SOLN Place 1 drop into both eyes See admin instructions. Once to twice daily as needed for dry eyes   Probiotic Product (ALIGN) 4 MG CAPS Take 4 mg by mouth daily.   ranolazine (RANEXA) 500 MG 12 hr tablet TAKE 1 TABLET EVERY 12 HOURS.   VYTORIN 10-20 MG per tablet Take 1 tablet by mouth daily at 6 PM.    Radiology:   CT angiogram chest 04/11/2019: Cardiovascular: There are 2 small peripheral pulmonary emboli in the right lower lobe. No other pulmonary emboli. Aortic atherosclerosis. RV LV ratio is normal. Mild cardiomegaly. No pericardial effusion.  Cardiac Studies:   Coronary angiogram 04/18/2014:  PTCA and suboptimal balloon angioplasty of mid LAD and D2.  Residual 80% stenosis in the distal LAD not amenable to intervention. LAD collateralized by RCA. Normal LVEF.  Exercise myoview stress 08/11/2014: 1. The resting electrocardiogram demonstrated normal sinus  rhythm, normal resting conduction, no resting arrhythmias and normal rest repolarization.  The stress electrocardiogram was normal.   The patient performed treadmill exercise using a Bruce protocol, completing 5:30 minutes.The patient completed an estimated workload of 7.05 METS, 88% of the maximum predicted heart rate.  The stress test was terminated because of fatigue and THR met. 2. Myocardial perfusion imaging is normal. Overall left ventricular systolic function was normal without regional wall motion abnormalities. The left ventricular ejection fraction was 75%.   Carotid artery duplex 03/12/2020:  Duplex suggests stenosis in the right internal carotid artery (1-15%).  Duplex suggests stenosis in the left internal carotid artery (minimal).  Antegrade right vertebral artery flow. Antegrade left vertebral artery flow.  Compared to the study done on  04/10/2020, right ECA stenosis of 15 to 49% now appears to have regressed.  PCV ECHOCARDIOGRAM COMPLETE 16/96/7893 Normal LV systolic function with visual EF 60-65%. Left ventricle cavity is normal in size. Normal left ventricular wall thickness. Normal global wall motion. Normal diastolic filling pattern, normal LAP. Mild calcification of the posterior mitral valve annulus. Trace tricuspid regurgitation. No evidence of pulmonary hypertension. Mild pulmonic regurgitation. Compared to 04/10/2020 LVEF remains preserved, G2DD, moderate MR, mild PHTN have resolved, otherwise no significant change.  EKG: 03/18/2021: Sinus bradycardia rate of 53 bpm.  Normal axis.  No evidence of ischemia or underlying injury pattern.  Compared EKG 03/16/2020, no significant change.  Assessment     ICD-10-CM   1. Coronary artery disease of native artery of native heart with stable angina pectoris ([place])  I25.118     2. Dyspnea on exertion  R06.09 Ambulatory referral to Pulmonology      No orders of the defined types were placed in this encounter.  There are no  discontinued medications.  Orders Placed This Encounter  Procedures   Ambulatory referral to Pulmonology    Referral Priority:   Routine    Referral Type:   Consultation    Referral Reason:   Specialty Services Required    Requested Specialty:   Pulmonary Disease    Number of Visits Requested:   1       Recommendations:    REES MATURA  is a 81 y.o. Caucasian female  follow-up of coronary artery disease h/o NSTEMI on 04/18/2014 S/P  balloon angioplasty of the LAD and D2 with 80% residual stenosis which was not amenable to further intervention due to the LAD being a small artery. and mild peripheral arterial disease, left leg sciatica and chronic back pain, hypertension and hyperlipidemia.   After completing a year of Eliquis, it was discontinued it his PE was likely related to immobility with severe pneumonia, particularly in the context of patient's history of GI bleed.  Patient was switched back to aspirin 81 mg daily for underlying CAD.   Patient presents for 4-week follow-up.  Last office visit carotid artery disease had progressed, therefore no further surveillance indicated.  Given dyspnea and reduced exercise tolerance at last visit ordered repeat echocardiogram, laboratory testing, however patient opted to hold off on further ischemic evaluation.  Echocardiogram revealed normal LVEF, diastolic dysfunction MR and mild pulmonary hypertension have resolved compared to 04/2020. Labs were relatively unremarkable with the exception of minimally elevated BNP.   Reviewed and discussed results of echocardiogram and laboratory testing with patient and her daughter, questions were addressed.  Lab evaluation as well as echocardiogram do not explain patient's dyspnea on exertion.  Therefore discussed with patient and her daughter further ischemic evaluation.  Discussed indication, risk, benefits of undergoing stress testing.  Patient verbalized understanding of the benefits, however she does not wish  to undergo further ischemic evaluation, she understands the risks of holding off on this.  Therefore counseled patient regarding signs symptoms that would warrant urgent or emergent evaluation, she verbalized understanding agreement.  In regard to dyspnea this is likely multifactorial, may be related to underlying lung disease.  Patient and her daughter are requesting referral to pulmonology, I will therefore send referral to [facility] pulmonology.  Follow-up in 6 months, sooner if needed, for CAD, hyperlipidemia, hypertension.   Alethia Berthold, PA-C 04/15/2021, 9:17 AM Office: [phone]

## 2021-04-19 ENCOUNTER — Other Ambulatory Visit: Payer: Self-pay | Admitting: Cardiology

## 2021-04-23 DIAGNOSIS — J449 Chronic obstructive pulmonary disease, unspecified: Secondary | ICD-10-CM | POA: Diagnosis not present

## 2021-04-23 DIAGNOSIS — E1159 Type 2 diabetes mellitus with other circulatory complications: Secondary | ICD-10-CM | POA: Diagnosis not present

## 2021-04-23 DIAGNOSIS — R051 Acute cough: Secondary | ICD-10-CM | POA: Diagnosis not present

## 2021-04-23 DIAGNOSIS — R5383 Other fatigue: Secondary | ICD-10-CM | POA: Diagnosis not present

## 2021-04-23 DIAGNOSIS — Z1152 Encounter for screening for COVID-19: Secondary | ICD-10-CM | POA: Diagnosis not present

## 2021-04-23 DIAGNOSIS — R0981 Nasal congestion: Secondary | ICD-10-CM | POA: Diagnosis not present

## 2021-04-23 DIAGNOSIS — Z9981 Dependence on supplemental oxygen: Secondary | ICD-10-CM | POA: Diagnosis not present

## 2021-04-25 ENCOUNTER — Ambulatory Visit: Payer: Medicare PPO | Admitting: Pulmonary Disease

## 2021-04-25 ENCOUNTER — Encounter: Payer: Self-pay | Admitting: Pulmonary Disease

## 2021-04-25 ENCOUNTER — Other Ambulatory Visit: Payer: Self-pay

## 2021-04-25 VITALS — BP 130/70 | HR 52 | Ht 64.0 in | Wt 182.6 lb

## 2021-04-25 DIAGNOSIS — R0602 Shortness of breath: Secondary | ICD-10-CM

## 2021-04-25 MED ORDER — BUDESONIDE-FORMOTEROL FUMARATE 80-4.5 MCG/ACT IN AERO: 2.0000 | INHALATION_SPRAY | Freq: Two times a day (BID) | RESPIRATORY_TRACT | 6 refills | Status: DC

## 2021-04-25 NOTE — Patient Instructions (Addendum)
Use symbicort 2 puffs twice daily  - rinse mouth out after each use  We will give you spacer to use with the symbicort inhaler  Try xyzal allergy medicine daily as needed for spring allergies  We will schedule you for pulmonary function tests and follow up in 2-3 months.

## 2021-04-25 NOTE — Progress Notes (Signed)
Synopsis: Referred in February 2023 for shortness of breath by Lawerance Cruel, PA  Subjective:   PATIENT ID: Desiree Leon GENDER: female DOB: [birth year], MRN: [id number]   HPI  Chief Complaint  Patient presents with   Consult    Referred by cardiology for DOE. States this has been going on for the past year. Increased chest tightness, wheezing and coughing. Uses 2.5L of O2 at night. DME is [employer].    Desiree Leon is an 81 year old woman, former smoker with history of coronary artery disease, pulmonary emboli, hypothyroidism and mitral valve prolapse who is referred to pulmonary clinic for shortness of breath.   Cardiology note 04/15/21 reviewed. Echo 03/21/21 showed LV EF 60-65% and improvement in mildly elevated PASP pressures from an echo in 2022.   PFTs from 2015 were within normal limits. She has symbicort 80-4.34mcg inhaler and is using it as needed. She does notice some benefit in her wheezing and cough when used.   She had covid 04/2020. She was admitted in 04/2019 for pneumonia and pulmonary emboli. She has completed eliquis therapy for the PE.   She has shortness of breath when bathing herself and dressing herself. The shortness of breath has been on going since her myocardial infarction in 2016. She is using 2.5L of O2 at night since 2017. She is not very active due to back pain. She has seasonal allergies.  She lives alone with her jack russel dog name [facility]. Her daughter lives next door. She quit smoking in 2007. She smoked half a pack for 30 years.    Past Medical History:  Diagnosis Date   Anxiety    Blood transfusion    CAP (community acquired pneumonia)    COPD (chronic obstructive pulmonary disease) ([place])    Heart murmur    History of colon polyps    Hyperlipidemia    MI (myocardial infarction) ([place])    Mitral valve prolapse    NSTEMI (non-ST elevated myocardial infarction) ([place])    Osteoarthritis    Osteoporosis    Pulmonary emboli ([place]) 04/12/2019    Spinal stenosis    Thyroid disease    hypothyroidism   Unstable angina ([place]) 05/08/2014   Venous insufficiency      Family History  Problem Relation Age of Onset   Heart disease Mother    Heart disease Father    Cancer Sister        breast   Cancer Daughter        cervical and bone   Heart disease Sister    Thyroid disease Sister      Social History   Socioeconomic History   Marital status: Widowed    Spouse name: Not on file   Number of children: 3   Years of education: Not on file   Highest education level: Not on file  Occupational History   Not on file  Tobacco Use   Smoking status: Former    Types: Cigarettes    Quit date: 03/10/2005    Years since quitting: 16.1   Smokeless tobacco: Never  Vaping Use   Vaping Use: Never used  Substance and Sexual Activity   Alcohol use: No   Drug use: No   Sexual activity: Not on file  Other Topics Concern   Not on file  Social History Narrative   Not on file   Social Determinants of Health   Financial Resource Strain: Not on file  Food Insecurity: Not on file  Transportation Needs:  Not on file  Physical Activity: Not on file  Stress: Not on file  Social Connections: Not on file  Intimate Partner Violence: Not on file     Allergies  Allergen Reactions   Other Anaphylaxis and Cough    fragarances-perfumes   Dilaudid [Hydromorphone Hcl] Nausea Only   Erythromycin Other (See Comments)     Outpatient Medications Prior to Visit  Medication Sig Dispense Refill   ACCU-CHEK SMARTVIEW test strip      acetaminophen (TYLENOL) 500 MG tablet Take 500 mg by mouth every 6 (six) hours as needed for fever or headache.      ALPRAZolam (XANAX) 0.25 MG tablet Take 0.125 mg by mouth at bedtime as needed for anxiety.      aspirin (ASPIRIN CHILDRENS) 81 MG chewable tablet Chew 1 tablet (81 mg total) by mouth daily.     budesonide-formoterol (SYMBICORT) 80-4.5 MCG/ACT inhaler Inhale 2 puffs into the lungs 2 (two) times daily.      cholecalciferol (VITAMIN D) 25 MCG (1000 UNIT) tablet 2 capsules.     doxycycline (VIBRA-TABS) 100 MG tablet Take 1 tablet by mouth in the morning and at bedtime.     Elastic Bandages & Supports (O-2 SUSPENSORY) MISC 8 hrs every night     escitalopram (LEXAPRO) 5 MG tablet Take 5 mg by mouth every evening.      glucose blood test strip Accu-Chek SmartView Test Strips  CHECK BLOOD SUGAR TWICE DAILY.     iron polysaccharides (NIFEREX) 150 MG capsule Take 150 mg by mouth daily.     Levothyroxine Sodium 137 MCG CAPS Take 137 mcg by mouth daily before breakfast.      methocarbamol (ROBAXIN) 500 MG tablet 1 tab TID prn muscle spasms     metoprolol tartrate (LOPRESSOR) 25 MG tablet Take 25 mg by mouth 2 (two) times daily.      multivitamin-iron-minerals-folic acid (CENTRUM) chewable tablet Chew 1 tablet by mouth daily.     niCARdipine (CARDENE) 20 MG capsule Take 20 mg by mouth every evening.      nitroGLYCERIN (NITRODUR - DOSED IN MG/24 HR) 0.4 mg/hr patch APPLY ONE PATCH ONCE DAILY AS DIRECTED 30 patch 3   nitroGLYCERIN (NITROSTAT) 0.4 MG SL tablet Place 0.4 mg under the tongue every 5 (five) minutes as needed for chest pain.     OXYGEN Inhale 2 L into the lungs at bedtime.     pantoprazole (PROTONIX) 40 MG tablet Take 1 tablet (40 mg total) by mouth daily at 12 noon. (Patient taking differently: Take 40 mg by mouth 2 (two) times daily.) 30 tablet 1   Polyethyl Glycol-Propyl Glycol (SYSTANE) 0.4-0.3 % SOLN Place 1 drop into both eyes See admin instructions. Once to twice daily as needed for dry eyes     predniSONE (DELTASONE) 10 MG tablet Take 10 mg by mouth daily. Take 1 tablet once daily for the next 5 days.     Probiotic Product (ALIGN) 4 MG CAPS Take 4 mg by mouth daily.     ranolazine (RANEXA) 500 MG 12 hr tablet TAKE ONE TABLET BY MOUTH EVERY TWELVE HOURS 180 tablet 1   VYTORIN 10-20 MG per tablet Take 1 tablet by mouth daily at 6 PM.      No facility-administered medications prior to visit.    Review of Systems  Constitutional:  Negative for chills, fever, malaise/fatigue and weight loss.  HENT:  Negative for congestion, sinus pain and sore throat.   Eyes: Negative.   Respiratory:  Positive  for shortness of breath. Negative for cough, hemoptysis, sputum production and wheezing.   Cardiovascular:  Positive for chest pain. Negative for palpitations, orthopnea, claudication and leg swelling.  Gastrointestinal:  Negative for abdominal pain, heartburn, nausea and vomiting.  Genitourinary: Negative.   Musculoskeletal:  Negative for joint pain and myalgias.  Skin:  Negative for rash.  Neurological:  Negative for weakness.  Endo/Heme/Allergies: Negative.   Psychiatric/Behavioral: Negative.     Objective:   Vitals:   04/25/21 0940  BP: 130/70  Pulse: (!) 52  SpO2: 98%  Weight: 182 lb 9.6 oz (82.8 kg)  Height: 5\' 4"  (1.626 m)    Physical Exam Constitutional:      General: She is not in acute distress.    Appearance: She is not ill-appearing.  HENT:     Head: Normocephalic and atraumatic.  Eyes:     General: No scleral icterus.    Conjunctiva/sclera: Conjunctivae normal.     Pupils: Pupils are equal, round, and reactive to light.  Cardiovascular:     Rate and Rhythm: Normal rate and regular rhythm.     Pulses: Normal pulses.     Heart sounds: Normal heart sounds. No murmur heard. Pulmonary:     Effort: Pulmonary effort is normal.     Breath sounds: Normal breath sounds. No wheezing, rhonchi or rales.  Abdominal:     General: Bowel sounds are normal.     Palpations: Abdomen is soft.  Musculoskeletal:     Right lower leg: No edema.     Left lower leg: No edema.  Lymphadenopathy:     Cervical: No cervical adenopathy.  Skin:    General: Skin is warm and dry.  Neurological:     General: No focal deficit present.     Mental Status: She is alert.  Psychiatric:        Mood and Affect: Mood normal.        Behavior: Behavior normal.        Thought Content: Thought  content normal.        Judgment: Judgment normal.   CBC    Component Value Date/Time   WBC 8.3 03/18/2021 1132   WBC 6.8 05/01/2020 1547   RBC 3.92 03/18/2021 1132   RBC 3.88 05/01/2020 1547   HGB 12.7 03/18/2021 1132   HCT 37.9 03/18/2021 1132   PLT 245 03/18/2021 1132   MCV 97 03/18/2021 1132   MCH 32.4 03/18/2021 1132   MCH 29.9 05/01/2020 1547   MCHC 33.5 03/18/2021 1132   MCHC 32.2 05/01/2020 1547   RDW 13.5 03/18/2021 1132   LYMPHSABS 2.8 05/01/2020 1547   MONOABS 0.5 05/01/2020 1547   EOSABS 0.1 05/01/2020 1547   BASOSABS 0.0 05/01/2020 1547   BMP Latest Ref Rng & Units 03/18/2021 05/01/2020 05/01/2020  Glucose 70 - 99 mg/dL 97 - 105(H)  BUN 8 - 27 mg/dL 15 - 16  Creatinine 0.57 - 1.00 mg/dL 0.76 - 0.61  BUN/Creat Ratio 12 - 28 20 - -  Sodium 134 - 144 mmol/L 141 139 138  Potassium 3.5 - 5.2 mmol/L 5.1 3.7 3.7  Chloride 96 - 106 mmol/L 105 - 104  CO2 20 - 29 mmol/L 22 - 25  Calcium 8.7 - 10.3 mg/dL 8.8 - 8.8(L)   Chest imaging: CXR 05/01/20 Mild atelectasis, no consolidation or effusion.  CTA PE 04/11/19 Cardiovascular: There are 2 small peripheral pulmonary emboli in the right lower lobe. No other pulmonary emboli. Aortic atherosclerosis. RV LV ratio is normal. Mild  cardiomegaly. No pericardial effusion.  Mediastinum/Nodes: No hilar or mediastinal adenopathy. Esophagus is normal. Thyroid gland is not identified.   Lungs/Pleura: No infiltrates or effusions. There is several small blebs in the right lower lobe. Minimal atelectasis in the lingula.  PFT: PFT Results Latest Ref Rng & Units 06/09/2013  FVC-Pre L 2.30  FVC-Predicted Pre % 80  FVC-Post L 2.60  FVC-Predicted Post % 90  Pre FEV1/FVC % % 84  Post FEV1/FCV % % 86  FEV1-Pre L 1.93  FEV1-Predicted Pre % 89  FEV1-Post L 2.23  DLCO uncorrected ml/min/mmHg 22.12  DLCO UNC% % 91  DLCO corrected ml/min/mmHg 22.12  DLCO COR %Predicted % 91  DLVA Predicted % 121  TLC L 4.25  TLC % Predicted % 84  RV %  Predicted % 90  PFT 2015 within normal limits.  Labs:  Path:  Echo 03/21/21: LV EF 60-65% and improvement in mildly elevated PASP pressures from an echo in 2022.   Heart Catheterization:   Assessment & Plan:   Shortness of breath  Discussion: Desiree Leon is an 81 year old woman, former smoker with history of coronary artery disease, pulmonary emboli, hypothyroidism and mitral valve prolapse who is referred to pulmonary clinic for shortness of breath.   Her dyspnea may be multifactorial in setting of coronary artery disease and possible obstructive lung disease along with deconditioning.  She is to use symbicort 80-4.27mcg 2 puffs twice daily with spacer. She is to use xyzal for her issues with allergies.   She did not desaturate on simple walk in clinic today. We will schedule her for pulmonary function tests in 2-3 months with follow up.   Freda Jackson, MD [facility] Office: [phone]   Current Outpatient Medications:    ACCU-CHEK SMARTVIEW test strip, , Disp: , Rfl:    acetaminophen (TYLENOL) 500 MG tablet, Take 500 mg by mouth every 6 (six) hours as needed for fever or headache. , Disp: , Rfl:    ALPRAZolam (XANAX) 0.25 MG tablet, Take 0.125 mg by mouth at bedtime as needed for anxiety. , Disp: , Rfl:    aspirin (ASPIRIN CHILDRENS) 81 MG chewable tablet, Chew 1 tablet (81 mg total) by mouth daily., Disp: , Rfl:    budesonide-formoterol (SYMBICORT) 80-4.5 MCG/ACT inhaler, Inhale 2 puffs into the lungs 2 (two) times daily., Disp: , Rfl:    cholecalciferol (VITAMIN D) 25 MCG (1000 UNIT) tablet, 2 capsules., Disp: , Rfl:    doxycycline (VIBRA-TABS) 100 MG tablet, Take 1 tablet by mouth in the morning and at bedtime., Disp: , Rfl:    Elastic Bandages & Supports (O-2 SUSPENSORY) MISC, 8 hrs every night, Disp: , Rfl:    escitalopram (LEXAPRO) 5 MG tablet, Take 5 mg by mouth every evening. , Disp: , Rfl:    glucose blood test strip, Accu-Chek SmartView  Test Strips  CHECK BLOOD SUGAR TWICE DAILY., Disp: , Rfl:    iron polysaccharides (NIFEREX) 150 MG capsule, Take 150 mg by mouth daily., Disp: , Rfl:    Levothyroxine Sodium 137 MCG CAPS, Take 137 mcg by mouth daily before breakfast. , Disp: , Rfl:    methocarbamol (ROBAXIN) 500 MG tablet, 1 tab TID prn muscle spasms, Disp: , Rfl:    metoprolol tartrate (LOPRESSOR) 25 MG tablet, Take 25 mg by mouth 2 (two) times daily. , Disp: , Rfl:    multivitamin-iron-minerals-folic acid (CENTRUM) chewable tablet, Chew 1 tablet by mouth daily., Disp: , Rfl:    niCARdipine (CARDENE) 20  MG capsule, Take 20 mg by mouth every evening. , Disp: , Rfl:    nitroGLYCERIN (NITRODUR - DOSED IN MG/24 HR) 0.4 mg/hr patch, APPLY ONE PATCH ONCE DAILY AS DIRECTED, Disp: 30 patch, Rfl: 3   nitroGLYCERIN (NITROSTAT) 0.4 MG SL tablet, Place 0.4 mg under the tongue every 5 (five) minutes as needed for chest pain., Disp: , Rfl:    OXYGEN, Inhale 2 L into the lungs at bedtime., Disp: , Rfl:    pantoprazole (PROTONIX) 40 MG tablet, Take 1 tablet (40 mg total) by mouth daily at 12 noon. (Patient taking differently: Take 40 mg by mouth 2 (two) times daily.), Disp: 30 tablet, Rfl: 1   Polyethyl Glycol-Propyl Glycol (SYSTANE) 0.4-0.3 % SOLN, Place 1 drop into both eyes See admin instructions. Once to twice daily as needed for dry eyes, Disp: , Rfl:    predniSONE (DELTASONE) 10 MG tablet, Take 10 mg by mouth daily. Take 1 tablet once daily for the next 5 days., Disp: , Rfl:    Probiotic Product (ALIGN) 4 MG CAPS, Take 4 mg by mouth daily., Disp: , Rfl:    ranolazine (RANEXA) 500 MG 12 hr tablet, TAKE ONE TABLET BY MOUTH EVERY TWELVE HOURS, Disp: 180 tablet, Rfl: 1   VYTORIN 10-20 MG per tablet, Take 1 tablet by mouth daily at 6 PM. , Disp: , Rfl:

## 2021-05-21 ENCOUNTER — Inpatient Hospital Stay (HOSPITAL_COMMUNITY)
Admission: EM | Admit: 2021-05-21 | Discharge: 2021-05-23 | DRG: 176 | Disposition: A | Discharge status: Home / Self Care | Payer: Medicare PPO | Attending: Internal Medicine | Admitting: Internal Medicine

## 2021-05-21 ENCOUNTER — Emergency Department (HOSPITAL_COMMUNITY): Payer: Medicare PPO

## 2021-05-21 ENCOUNTER — Other Ambulatory Visit: Payer: Self-pay

## 2021-05-21 ENCOUNTER — Encounter (HOSPITAL_COMMUNITY): Payer: Self-pay | Admitting: Emergency Medicine

## 2021-05-21 DIAGNOSIS — Z7951 Long term (current) use of inhaled steroids: Secondary | ICD-10-CM

## 2021-05-21 DIAGNOSIS — I2699 Other pulmonary embolism without acute cor pulmonale: Secondary | ICD-10-CM | POA: Diagnosis not present

## 2021-05-21 DIAGNOSIS — Z8249 Family history of ischemic heart disease and other diseases of the circulatory system: Secondary | ICD-10-CM

## 2021-05-21 DIAGNOSIS — M810 Age-related osteoporosis without current pathological fracture: Secondary | ICD-10-CM | POA: Diagnosis present

## 2021-05-21 DIAGNOSIS — Z86010 Personal history of colonic polyps: Secondary | ICD-10-CM

## 2021-05-21 DIAGNOSIS — Z7982 Long term (current) use of aspirin: Secondary | ICD-10-CM | POA: Diagnosis not present

## 2021-05-21 DIAGNOSIS — I252 Old myocardial infarction: Secondary | ICD-10-CM

## 2021-05-21 DIAGNOSIS — Z20822 Contact with and (suspected) exposure to covid-19: Secondary | ICD-10-CM | POA: Diagnosis not present

## 2021-05-21 DIAGNOSIS — R079 Chest pain, unspecified: Secondary | ICD-10-CM | POA: Diagnosis not present

## 2021-05-21 DIAGNOSIS — R0781 Pleurodynia: Secondary | ICD-10-CM

## 2021-05-21 DIAGNOSIS — Z9981 Dependence on supplemental oxygen: Secondary | ICD-10-CM | POA: Diagnosis not present

## 2021-05-21 DIAGNOSIS — F32A Depression, unspecified: Secondary | ICD-10-CM

## 2021-05-21 DIAGNOSIS — K219 Gastro-esophageal reflux disease without esophagitis: Secondary | ICD-10-CM | POA: Diagnosis present

## 2021-05-21 DIAGNOSIS — J449 Chronic obstructive pulmonary disease, unspecified: Secondary | ICD-10-CM | POA: Diagnosis not present

## 2021-05-21 DIAGNOSIS — Z955 Presence of coronary angioplasty implant and graft: Secondary | ICD-10-CM

## 2021-05-21 DIAGNOSIS — Z881 Allergy status to other antibiotic agents status: Secondary | ICD-10-CM

## 2021-05-21 DIAGNOSIS — Z8349 Family history of other endocrine, nutritional and metabolic diseases: Secondary | ICD-10-CM

## 2021-05-21 DIAGNOSIS — E039 Hypothyroidism, unspecified: Secondary | ICD-10-CM | POA: Diagnosis present

## 2021-05-21 DIAGNOSIS — I10 Essential (primary) hypertension: Secondary | ICD-10-CM | POA: Diagnosis not present

## 2021-05-21 DIAGNOSIS — J984 Other disorders of lung: Secondary | ICD-10-CM | POA: Diagnosis not present

## 2021-05-21 DIAGNOSIS — Z90710 Acquired absence of both cervix and uterus: Secondary | ICD-10-CM

## 2021-05-21 DIAGNOSIS — J90 Pleural effusion, not elsewhere classified: Secondary | ICD-10-CM | POA: Diagnosis not present

## 2021-05-21 DIAGNOSIS — R0602 Shortness of breath: Secondary | ICD-10-CM | POA: Diagnosis not present

## 2021-05-21 DIAGNOSIS — I82531 Chronic embolism and thrombosis of right popliteal vein: Secondary | ICD-10-CM | POA: Diagnosis not present

## 2021-05-21 DIAGNOSIS — Z9049 Acquired absence of other specified parts of digestive tract: Secondary | ICD-10-CM

## 2021-05-21 DIAGNOSIS — Z86711 Personal history of pulmonary embolism: Secondary | ICD-10-CM | POA: Diagnosis not present

## 2021-05-21 DIAGNOSIS — Z885 Allergy status to narcotic agent status: Secondary | ICD-10-CM

## 2021-05-21 DIAGNOSIS — E785 Hyperlipidemia, unspecified: Secondary | ICD-10-CM | POA: Diagnosis present

## 2021-05-21 DIAGNOSIS — Z9851 Tubal ligation status: Secondary | ICD-10-CM

## 2021-05-21 DIAGNOSIS — R0789 Other chest pain: Secondary | ICD-10-CM | POA: Diagnosis not present

## 2021-05-21 DIAGNOSIS — R609 Edema, unspecified: Secondary | ICD-10-CM | POA: Diagnosis not present

## 2021-05-21 DIAGNOSIS — Z79899 Other long term (current) drug therapy: Secondary | ICD-10-CM

## 2021-05-21 DIAGNOSIS — I459 Conduction disorder, unspecified: Secondary | ICD-10-CM | POA: Diagnosis present

## 2021-05-21 DIAGNOSIS — Z23 Encounter for immunization: Secondary | ICD-10-CM | POA: Diagnosis not present

## 2021-05-21 DIAGNOSIS — R0902 Hypoxemia: Secondary | ICD-10-CM | POA: Diagnosis present

## 2021-05-21 DIAGNOSIS — Z85828 Personal history of other malignant neoplasm of skin: Secondary | ICD-10-CM | POA: Diagnosis not present

## 2021-05-21 DIAGNOSIS — I959 Hypotension, unspecified: Secondary | ICD-10-CM | POA: Diagnosis not present

## 2021-05-21 DIAGNOSIS — J9811 Atelectasis: Secondary | ICD-10-CM | POA: Diagnosis not present

## 2021-05-21 DIAGNOSIS — Z87891 Personal history of nicotine dependence: Secondary | ICD-10-CM

## 2021-05-21 DIAGNOSIS — I25118 Atherosclerotic heart disease of native coronary artery with other forms of angina pectoris: Secondary | ICD-10-CM | POA: Diagnosis not present

## 2021-05-21 DIAGNOSIS — Z91048 Other nonmedicinal substance allergy status: Secondary | ICD-10-CM

## 2021-05-21 DIAGNOSIS — E7800 Pure hypercholesterolemia, unspecified: Secondary | ICD-10-CM | POA: Diagnosis present

## 2021-05-21 DIAGNOSIS — M7989 Other specified soft tissue disorders: Secondary | ICD-10-CM | POA: Diagnosis not present

## 2021-05-21 HISTORY — DX: Other pulmonary embolism without acute cor pulmonale: I26.99

## 2021-05-21 LAB — CBC WITH DIFFERENTIAL/PLATELET
Abs Immature Granulocytes: 0.03 10*3/uL (ref 0.00–0.07)
Basophils Absolute: 0 10*3/uL (ref 0.0–0.1)
Basophils Relative: 0 %
Eosinophils Absolute: 0.1 10*3/uL (ref 0.0–0.5)
Eosinophils Relative: 1 %
HCT: 37.2 % (ref 36.0–46.0)
Hemoglobin: 12.6 g/dL (ref 12.0–15.0)
Immature Granulocytes: 0 %
Lymphocytes Relative: 33 %
Lymphs Abs: 3.5 10*3/uL (ref 0.7–4.0)
MCH: 32.5 pg (ref 26.0–34.0)
MCHC: 33.9 g/dL (ref 30.0–36.0)
MCV: 95.9 fL (ref 80.0–100.0)
Monocytes Absolute: 0.8 10*3/uL (ref 0.1–1.0)
Monocytes Relative: 7 %
Neutro Abs: 6.3 10*3/uL (ref 1.7–7.7)
Neutrophils Relative %: 59 %
Platelets: 277 10*3/uL (ref 150–400)
RBC: 3.88 MIL/uL (ref 3.87–5.11)
RDW: 13.8 % (ref 11.5–15.5)
WBC: 10.7 10*3/uL — ABNORMAL HIGH (ref 4.0–10.5)
nRBC: 0 % (ref 0.0–0.2)

## 2021-05-21 LAB — BRAIN NATRIURETIC PEPTIDE: B Natriuretic Peptide: 73.8 pg/mL (ref 0.0–100.0)

## 2021-05-21 LAB — COMPREHENSIVE METABOLIC PANEL
ALT: 13 U/L (ref 0–44)
AST: 16 U/L (ref 15–41)
Albumin: 3.7 g/dL (ref 3.5–5.0)
Alkaline Phosphatase: 81 U/L (ref 38–126)
Anion gap: 11 (ref 5–15)
BUN: 19 mg/dL (ref 8–23)
CO2: 24 mmol/L (ref 22–32)
Calcium: 9.4 mg/dL (ref 8.9–10.3)
Chloride: 101 mmol/L (ref 98–111)
Creatinine, Ser: 0.81 mg/dL (ref 0.44–1.00)
GFR, Estimated: >60 mL/min (ref >60)
Glucose, Bld: 158 mg/dL — ABNORMAL HIGH (ref 70–99)
Potassium: 4.6 mmol/L (ref 3.5–5.1)
Sodium: 136 mmol/L (ref 135–145)
Total Bilirubin: 0.5 mg/dL (ref 0.3–1.2)
Total Protein: 6.5 g/dL (ref 6.5–8.1)

## 2021-05-21 LAB — RESP PANEL BY RT-PCR (FLU A&B, COVID) ARPGX2
Influenza A by PCR: NEGATIVE
Influenza B by PCR: NEGATIVE
SARS Coronavirus 2 by RT PCR: NEGATIVE

## 2021-05-21 LAB — I-STAT CHEM 8, ED
BUN: 19 mg/dL (ref 8–23)
Calcium, Ion: 1.16 mmol/L (ref 1.15–1.40)
Chloride: 103 mmol/L (ref 98–111)
Creatinine, Ser: 0.7 mg/dL (ref 0.44–1.00)
Glucose, Bld: 150 mg/dL — ABNORMAL HIGH (ref 70–99)
HCT: 39 % (ref 36.0–46.0)
Hemoglobin: 13.3 g/dL (ref 12.0–15.0)
Potassium: 4.5 mmol/L (ref 3.5–5.1)
Sodium: 137 mmol/L (ref 135–145)
TCO2: 25 mmol/L (ref 22–32)

## 2021-05-21 LAB — PROTIME-INR
INR: 1 (ref 0.8–1.2)
Prothrombin Time: 13 seconds (ref 11.4–15.2)

## 2021-05-21 LAB — TROPONIN I (HIGH SENSITIVITY): Troponin I (High Sensitivity): 6 ng/L (ref <18)

## 2021-05-21 LAB — LACTIC ACID, PLASMA: Lactic Acid, Venous: 2.2 mmol/L (ref 0.5–1.9)

## 2021-05-21 MED ORDER — POLYVINYL ALCOHOL 1.4 % OP SOLN: 1.0000 [drp] | Freq: Two times a day (BID) | OPHTHALMIC | Status: DC | PRN

## 2021-05-21 MED ORDER — PREDNISONE 20 MG PO TABS: 10.0000 mg | ORAL_TABLET | Freq: Every day | ORAL | Status: DC

## 2021-05-21 MED ORDER — NITROGLYCERIN 0.4 MG SL SUBL: 0.4000 mg | SUBLINGUAL_TABLET | SUBLINGUAL | Status: DC | PRN

## 2021-05-21 MED ORDER — NICARDIPINE HCL 20 MG PO CAPS
20.0000 mg | ORAL_CAPSULE | Freq: Every evening | ORAL | Status: DC
  Administered 2021-05-22: 20 mg via ORAL
  Filled 2021-05-21 (×3): qty 1

## 2021-05-21 MED ORDER — MAGNESIUM HYDROXIDE 400 MG/5ML PO SUSP
30.0000 mL | Freq: Every day | ORAL | Status: DC | PRN
  Filled 2021-05-21: qty 30

## 2021-05-21 MED ORDER — ACETAMINOPHEN 325 MG PO TABS
650.0000 mg | ORAL_TABLET | Freq: Four times a day (QID) | ORAL | Status: DC | PRN
  Administered 2021-05-22 – 2021-05-23 (×3): 650 mg via ORAL
  Filled 2021-05-21 (×3): qty 2

## 2021-05-21 MED ORDER — SODIUM CHLORIDE 0.9 % IV SOLN: INTRAVENOUS | Status: DC

## 2021-05-21 MED ORDER — METOPROLOL TARTRATE 25 MG PO TABS
25.0000 mg | ORAL_TABLET | Freq: Two times a day (BID) | ORAL | Status: DC
  Administered 2021-05-22 – 2021-05-23 (×2): 25 mg via ORAL
  Filled 2021-05-21 (×3): qty 1

## 2021-05-21 MED ORDER — LEVOTHYROXINE SODIUM 137 MCG PO CAPS: 137.0000 ug | ORAL_CAPSULE | Freq: Every day | ORAL | Status: DC

## 2021-05-21 MED ORDER — ALPRAZOLAM 0.25 MG PO TABS: 0.1250 mg | ORAL_TABLET | Freq: Every evening | ORAL | Status: DC | PRN

## 2021-05-21 MED ORDER — TRAZODONE HCL 50 MG PO TABS: 25.0000 mg | ORAL_TABLET | Freq: Every evening | ORAL | Status: DC | PRN

## 2021-05-21 MED ORDER — ONDANSETRON HCL 4 MG/2ML IJ SOLN: 4.0000 mg | Freq: Four times a day (QID) | INTRAMUSCULAR | Status: DC | PRN

## 2021-05-21 MED ORDER — EZETIMIBE-SIMVASTATIN 10-20 MG PO TABS
1.0000 | ORAL_TABLET | Freq: Every day | ORAL | Status: DC
  Filled 2021-05-21: qty 1

## 2021-05-21 MED ORDER — HEPARIN (PORCINE) 25000 UT/250ML-% IV SOLN
1200.0000 [IU]/h | INTRAVENOUS | Status: DC
  Administered 2021-05-21: 1200 [IU]/h via INTRAVENOUS
  Filled 2021-05-21: qty 250

## 2021-05-21 MED ORDER — RANOLAZINE ER 500 MG PO TB12
500.0000 mg | ORAL_TABLET | Freq: Two times a day (BID) | ORAL | Status: DC
  Administered 2021-05-22 – 2021-05-23 (×3): 500 mg via ORAL
  Filled 2021-05-21 (×5): qty 1

## 2021-05-21 MED ORDER — ESCITALOPRAM OXALATE 10 MG PO TABS
5.0000 mg | ORAL_TABLET | Freq: Every evening | ORAL | Status: DC
  Administered 2021-05-22: 5 mg via ORAL
  Filled 2021-05-21: qty 1

## 2021-05-21 MED ORDER — LEVOTHYROXINE SODIUM 25 MCG PO TABS
137.0000 ug | ORAL_TABLET | Freq: Every day | ORAL | Status: DC
  Administered 2021-05-22 – 2021-05-23 (×2): 137 ug via ORAL
  Filled 2021-05-21 (×2): qty 1

## 2021-05-21 MED ORDER — ONDANSETRON HCL 4 MG PO TABS: 4.0000 mg | ORAL_TABLET | Freq: Four times a day (QID) | ORAL | Status: DC | PRN

## 2021-05-21 MED ORDER — ACETAMINOPHEN 650 MG RE SUPP: 650.0000 mg | Freq: Four times a day (QID) | RECTAL | Status: DC | PRN

## 2021-05-21 MED ORDER — PANTOPRAZOLE SODIUM 40 MG PO TBEC
40.0000 mg | DELAYED_RELEASE_TABLET | Freq: Two times a day (BID) | ORAL | Status: DC
  Administered 2021-05-22 – 2021-05-23 (×3): 40 mg via ORAL
  Filled 2021-05-21 (×3): qty 1

## 2021-05-21 MED ORDER — HEPARIN BOLUS VIA INFUSION
4500.0000 [IU] | Freq: Once | INTRAVENOUS | Status: AC
  Administered 2021-05-21: 4500 [IU] via INTRAVENOUS
  Filled 2021-05-21: qty 4500

## 2021-05-21 MED ORDER — MORPHINE SULFATE (PF) 4 MG/ML IV SOLN
4.0000 mg | Freq: Once | INTRAVENOUS | Status: AC
  Administered 2021-05-21: 4 mg via INTRAVENOUS
  Filled 2021-05-21: qty 1

## 2021-05-21 MED ORDER — ADULT MULTIVITAMIN W/MINERALS CH
1.0000 | ORAL_TABLET | Freq: Every day | ORAL | Status: DC
  Administered 2021-05-22 – 2021-05-23 (×2): 1 via ORAL
  Filled 2021-05-21 (×2): qty 1

## 2021-05-21 MED ORDER — IOHEXOL 350 MG/ML SOLN
60.0000 mL | Freq: Once | INTRAVENOUS | Status: AC | PRN
  Administered 2021-05-21: 60 mL via INTRAVENOUS

## 2021-05-21 MED ORDER — POLYSACCHARIDE IRON COMPLEX 150 MG PO CAPS
150.0000 mg | ORAL_CAPSULE | Freq: Every day | ORAL | Status: DC
  Administered 2021-05-22 – 2021-05-23 (×2): 150 mg via ORAL
  Filled 2021-05-21 (×3): qty 1

## 2021-05-21 MED ORDER — VITAMIN D 25 MCG (1000 UNIT) PO TABS
2000.0000 [IU] | ORAL_TABLET | Freq: Every day | ORAL | Status: DC
  Administered 2021-05-22 – 2021-05-23 (×2): 2000 [IU] via ORAL
  Filled 2021-05-21 (×2): qty 2

## 2021-05-21 MED ORDER — SODIUM CHLORIDE 0.9 % IV BOLUS
500.0000 mL | Freq: Once | INTRAVENOUS | Status: AC
  Administered 2021-05-21: 500 mL via INTRAVENOUS

## 2021-05-21 MED ORDER — ASPIRIN 81 MG PO CHEW
81.0000 mg | CHEWABLE_TABLET | Freq: Every day | ORAL | Status: DC
  Administered 2021-05-23: 81 mg via ORAL
  Filled 2021-05-21 (×2): qty 1

## 2021-05-21 NOTE — ED Provider Notes (Signed)
[facility] EMERGENCY DEPARTMENT Provider Note   CSN: [id number] Arrival date & time: 05/21/21  2116     History  No chief complaint on file.   Desiree Leon is a 81 y.o. female.  The history is provided by the patient, medical records, the EMS personnel and a relative. No language interpreter was used.  Chest Pain Pain location:  L chest and L lateral chest Pain quality: sharp   Pain radiates to:  Upper back Pain severity:  Severe Onset quality:  Sudden Timing:  Constant Progression:  Unchanged Chronicity:  New Context: breathing   Relieved by:  Nothing Worsened by:  Deep breathing Ineffective treatments:  None tried Associated symptoms: back pain and shortness of breath   Associated symptoms: no abdominal pain, no anxiety, no cough, no fatigue, no fever, no headache, no lower extremity edema, no nausea, no palpitations, no vomiting and no weakness   Risk factors: coronary artery disease and prior DVT/PE       Home Medications Prior to Admission medications   Medication Sig Start Date End Date Taking? Authorizing Provider  ACCU-CHEK SMARTVIEW test strip  10/30/17   [provider]  acetaminophen (TYLENOL) 500 MG tablet Take 500 mg by mouth every 6 (six) hours as needed for fever or headache.     [provider]  ALPRAZolam Prudy Feeler) 0.25 MG tablet Take 0.125 mg by mouth at bedtime as needed for anxiety.     [provider]  aspirin (ASPIRIN CHILDRENS) 81 MG chewable tablet Chew 1 tablet (81 mg total) by mouth daily. 03/16/20   Yates Decamp, MD  budesonide-formoterol (SYMBICORT) 80-4.5 MCG/ACT inhaler Inhale 2 puffs into the lungs 2 (two) times daily. 04/25/21   Martina Sinner, MD  cholecalciferol (VITAMIN D) 25 MCG (1000 UNIT) tablet 2 capsules.    [provider]  doxycycline (VIBRA-TABS) 100 MG tablet Take 1 tablet by mouth in the morning and at bedtime. 04/23/21   [provider]  Elastic Bandages & Supports  (O-2 SUSPENSORY) MISC 8 hrs every night 01/08/15   [provider]  escitalopram (LEXAPRO) 5 MG tablet Take 5 mg by mouth every evening.     [provider]  glucose blood test strip Accu-Chek SmartView Test Strips  CHECK BLOOD SUGAR TWICE DAILY.    [provider]  iron polysaccharides (NIFEREX) 150 MG capsule Take 150 mg by mouth daily.    [provider]  Levothyroxine Sodium 137 MCG CAPS Take 137 mcg by mouth daily before breakfast.  08/25/11   [provider]  methocarbamol (ROBAXIN) 500 MG tablet 1 tab TID prn muscle spasms 09/26/19   [provider]  metoprolol tartrate (LOPRESSOR) 25 MG tablet Take 25 mg by mouth 2 (two) times daily.  09/12/11   [provider]  multivitamin-iron-minerals-folic acid (CENTRUM) chewable tablet Chew 1 tablet by mouth daily.    [provider]  niCARdipine (CARDENE) 20 MG capsule Take 20 mg by mouth every evening.  09/12/11   [provider]  nitroGLYCERIN (NITRODUR - DOSED IN MG/24 HR) 0.4 mg/hr patch APPLY ONE PATCH ONCE DAILY AS DIRECTED 03/06/21   Yates Decamp, MD  nitroGLYCERIN (NITROSTAT) 0.4 MG SL tablet Place 0.4 mg under the tongue every 5 (five) minutes as needed for chest pain.    [provider]  OXYGEN Inhale 2 L into the lungs at bedtime.    [provider]  pantoprazole (PROTONIX) 40 MG tablet Take 1 tablet (40 mg total) by  mouth daily at 12 noon. Patient taking differently: Take 40 mg by mouth 2 (two) times daily. 04/21/14   Vassie Loll, MD  Polyethyl Glycol-Propyl Glycol (SYSTANE) 0.4-0.3 % SOLN Place 1 drop into both eyes See admin instructions. Once to twice daily as needed for dry eyes    [provider]  predniSONE (DELTASONE) 10 MG tablet Take 10 mg by mouth daily. Take 1 tablet once daily for the next 5 days. 04/23/21   [provider]  Probiotic Product (ALIGN) 4 MG CAPS Take 4 mg by mouth daily.    [provider]   ranolazine (RANEXA) 500 MG 12 hr tablet TAKE ONE TABLET BY MOUTH EVERY TWELVE HOURS 04/19/21   Cantwell, Celeste C, PA-C  VYTORIN 10-20 MG per tablet Take 1 tablet by mouth daily at 6 PM.  09/18/11   [provider]  fluticasone (FLONASE) 50 MCG/ACT nasal spray Place 2 sprays into both nostrils daily. 04/04/19 09/12/19  [provider]      Allergies    Other, Dilaudid [hydromorphone hcl], and Erythromycin    Review of Systems   Review of Systems  Constitutional:  Negative for chills, fatigue and fever.  HENT:  Negative for congestion.   Eyes:  Negative for visual disturbance.  Respiratory:  Positive for chest tightness and shortness of breath. Negative for cough and wheezing.   Cardiovascular:  Positive for chest pain. Negative for palpitations.  Gastrointestinal:  Negative for abdominal pain, constipation, diarrhea, nausea and vomiting.  Genitourinary:  Negative for dysuria and flank pain.  Musculoskeletal:  Positive for back pain. Negative for neck pain and neck stiffness.  Skin:  Negative for rash.  Neurological:  Negative for weakness, light-headedness and headaches.  Psychiatric/Behavioral:  Negative for agitation and confusion.    Physical Exam Updated Vital Signs BP 127/70   Pulse (!) 58   Resp 16   Ht 5\' 4"  (1.626 m)   Wt 92 kg   SpO2 100%   BMI 34.81 kg/m  Physical Exam Vitals and nursing note reviewed.  Constitutional:      General: She is in acute distress.     Appearance: She is well-developed. She is ill-appearing. She is not toxic-appearing or diaphoretic.  HENT:     Head: Normocephalic and atraumatic.  Eyes:     Conjunctiva/sclera: Conjunctivae normal.     Pupils: Pupils are equal, round, and reactive to light.  Cardiovascular:     Rate and Rhythm: Regular rhythm. Bradycardia present.     Heart sounds: Murmur heard.  Pulmonary:     Effort: Pulmonary effort is normal. No tachypnea or respiratory distress.     Breath sounds: Normal breath  sounds. No decreased breath sounds, wheezing, rhonchi or rales.  Chest:     Chest wall: Tenderness present.  Abdominal:     Palpations: Abdomen is soft.     Tenderness: There is no abdominal tenderness. There is no rebound.  Musculoskeletal:        General: No swelling.     Cervical back: Neck supple.     Right lower leg: No tenderness. No edema.     Left lower leg: No tenderness. No edema.  Skin:    General: Skin is warm and dry.     Capillary Refill: Capillary refill takes less than 2 seconds.     Findings: No erythema.  Neurological:     General: No focal deficit present.     Mental Status: She is alert.  Psychiatric:  Mood and Affect: Mood is anxious.    ED Results / Procedures / Treatments   Labs (all labs ordered are listed, but only abnormal results are displayed) Labs Reviewed  CBC WITH DIFFERENTIAL/PLATELET - Abnormal; Notable for the following components:      Result Value   WBC 10.7 (*)    All other components within normal limits  COMPREHENSIVE METABOLIC PANEL - Abnormal; Notable for the following components:   Glucose, Bld 158 (*)    All other components within normal limits  LACTIC ACID, PLASMA - Abnormal; Notable for the following components:   Lactic Acid, Venous 2.2 (*)    All other components within normal limits  I-STAT CHEM 8, ED - Abnormal; Notable for the following components:   Glucose, Bld 150 (*)    All other components within normal limits  RESP PANEL BY RT-PCR (FLU A&B, COVID) ARPGX2  BRAIN NATRIURETIC PEPTIDE  PROTIME-INR  LACTIC ACID, PLASMA  BASIC METABOLIC PANEL  CBC  HEPARIN LEVEL (UNFRACTIONATED)  TROPONIN I (HIGH SENSITIVITY)  TROPONIN I (HIGH SENSITIVITY)    EKG EKG Interpretation  Date/Time:  Tuesday May 21 2021 21:21:04 EDT Ventricular Rate:  59 PR Interval:  66 QRS Duration: 121 QT Interval:  457 QTC Calculation: 453 R Axis:   73 Text Interpretation: Sinus rhythm Atrial premature complex Short PR interval  Nonspecific intraventricular conduction delay when compared to prior, similar appearance. No STEMI Confirmed by Theda Belfast ([id number]) on 05/21/2021 9:25:24 PM  Radiology CT Angio Chest PE W and/or Wo Contrast  Result Date: 05/21/2021 CLINICAL DATA:  Pulmonary embolus suspected with high probability. EXAM: CT ANGIOGRAPHY CHEST WITH CONTRAST TECHNIQUE: Multidetector CT imaging of the chest was performed using the standard protocol during bolus administration of intravenous contrast. Multiplanar CT image reconstructions and MIPs were obtained to evaluate the vascular anatomy. RADIATION DOSE REDUCTION: This exam was performed according to the departmental dose-optimization program which includes automated exposure control, adjustment of the mA and/or kV according to patient size and/or use of iterative reconstruction technique. CONTRAST:  60mL OMNIPAQUE IOHEXOL 350 MG/ML SOLN COMPARISON:  04/11/2019 FINDINGS: Cardiovascular: There is good opacification of the central and segmental pulmonary arteries. Filling defects are demonstrated in the distal left main pulmonary artery, extending into left lingular branches consistent with acute pulmonary embolus. RV to LV ratio is normal at 0.8. Mild cardiac enlargement. No pericardial effusions. Normal caliber of the thoracic aorta. Aortic and coronary artery calcifications. Mediastinum/Nodes: Esophagus is decompressed. No significant lymphadenopathy. Thyroid gland is unremarkable. Lungs/Pleura: Motion artifact limits examination but there appears to be some patchy airspace disease in the lungs, probably edema although pneumonia could also have this appearance. Small left pleural effusion with basilar atelectasis. Upper Abdomen: No acute abnormalities demonstrated in the visualized upper abdomen. Musculoskeletal: No chest wall abnormality. No acute or significant osseous findings. Degenerative changes in the spine. Review of the MIP images confirms the above findings.  IMPRESSION: 1. Positive examination for acute pulmonary embolus involving the distal left main pulmonary artery and extending into the left lingular branches. No evidence of right heart strain. 2. Patchy airspace disease in the lungs, probably edema although pneumonia could also have this appearance. Small left pleural effusion with basilar atelectasis. Critical Value/emergent results were called by telephone at the time of interpretation on 05/21/2021 at 10:20 pm to provider [facility] , who verbally acknowledged these results. Electronically Signed   By: Burman Nieves M.D.   On: 05/21/2021 22:28   DG Chest Portable 1 View  Result Date:  05/21/2021 CLINICAL DATA:  Central chest pain, short of breath, coronary artery disease EXAM: PORTABLE CHEST 1 VIEW COMPARISON:  05/01/2020 FINDINGS: Single frontal view of the chest demonstrates a stable cardiac silhouette. Stable atherosclerosis of the aortic arch. No acute airspace disease, effusion, or pneumothorax. No acute bony abnormality. IMPRESSION: 1. No acute intrathoracic process. Electronically Signed   By: Sharlet Salina M.D.   On: 05/21/2021 22:44    Procedures Procedures    CRITICAL CARE Performed by: Canary Brim Doriann Zuch Total critical care time: 35 minutes Critical care time was exclusive of separately billable procedures and treating other patients. Critical care was necessary to treat or prevent imminent or life-threatening deterioration. Critical care was time spent personally by me on the following activities: development of treatment plan with patient and/or surrogate as well as nursing, discussions with consultants, evaluation of patient's response to treatment, examination of patient, obtaining history from patient or surrogate, ordering and performing treatments and interventions, ordering and review of laboratory studies, ordering and review of radiographic studies, pulse oximetry and re-evaluation of patient's  condition.  Medications Ordered in ED Medications  aspirin chewable tablet 81 mg (has no administration in time range)  metoprolol tartrate (LOPRESSOR) tablet 25 mg (has no administration in time range)  niCARdipine (CARDENE) capsule 20 mg (has no administration in time range)  nitroGLYCERIN (NITROSTAT) SL tablet 0.4 mg (has no administration in time range)  ranolazine (RANEXA) 12 hr tablet 500 mg (has no administration in time range)  ezetimibe-simvastatin (VYTORIN) 10-20 MG per tablet 1 tablet (has no administration in time range)  ALPRAZolam (XANAX) tablet 0.125 mg (has no administration in time range)  escitalopram (LEXAPRO) tablet 5 mg (has no administration in time range)  pantoprazole (PROTONIX) EC tablet 40 mg (has no administration in time range)  iron polysaccharides (NIFEREX) capsule 150 mg (has no administration in time range)  cholecalciferol (VITAMIN D3) tablet 2,000 Units (has no administration in time range)  multivitamin with minerals tablet 1 tablet (has no administration in time range)  polyvinyl alcohol (LIQUIFILM TEARS) 1.4 % ophthalmic solution 1 drop (has no administration in time range)  0.9 %  sodium chloride infusion (has no administration in time range)  acetaminophen (TYLENOL) tablet 650 mg (has no administration in time range)    Or  acetaminophen (TYLENOL) suppository 650 mg (has no administration in time range)  traZODone (DESYREL) tablet 25 mg (has no administration in time range)  magnesium hydroxide (MILK OF MAGNESIA) suspension 30 mL (has no administration in time range)  ondansetron (ZOFRAN) tablet 4 mg (has no administration in time range)    Or  ondansetron (ZOFRAN) injection 4 mg (has no administration in time range)  heparin bolus via infusion 4,500 Units (has no administration in time range)  heparin ADULT infusion 100 units/mL (25000 units/223mL) (has no administration in time range)  levothyroxine (SYNTHROID) tablet 137 mcg (has no administration  in time range)  sodium chloride 0.9 % bolus 500 mL (0 mLs Intravenous Stopped 05/21/21 2232)  morphine (PF) 4 MG/ML injection 4 mg (4 mg Intravenous Given 05/21/21 2143)  iohexol (OMNIPAQUE) 350 MG/ML injection 60 mL (60 mLs Intravenous Contrast Given 05/21/21 2217)    ED Course/ Medical Decision Making/ A&P                           Medical Decision Making Amount and/or Complexity of Data Reviewed Labs: ordered. Radiology: ordered.  Risk Prescription drug management. Decision regarding hospitalization.   Desiree  MEKHI Leon is a 81 y.o. female with a past medical history significant for hypertension, diabetes, GERD, CAD with previous NSTEMI, pulmonary embolism not currently on anticoagulation, COPD, dyslipidemia, and carotid disease presents with left-sided chest pain.  According to patient, just prior to arrival patient had sudden onset of left-sided sharp pleuritic chest pain.  It wraps around towards her back.  She was completely at her baseline throughout the day with no recent fevers, chills, cough, nausea, vomiting, or other complaints.  She reports since the pain started she is having shortness of breath.  Daughter put her on some oxygen and EMS was called.  Patient has not had significant abnormalities with her vital signs in route and EKG did not show STEMI during transport by EMS report.  Patient reports the pain is severe and denies any diaphoresis, syncope, or leg symptoms.  Denies any abdominal symptoms.  Reports the pain is in the left chest and wraps around.  No rash to suggest shingles.  She is unsure if this pain feels like her previous blood clot or NSTEMI.  On exam, left chest is minimally tender to palpation.  She does have a murmur but otherwise lungs were clear.  Abdomen nontender.  Good pulses in extremities.  Vital signs reassuring initially with some mild bradycardia.  EKG did not show STEMI.  Clinically I am very concerned about pulmonary embolism.  Patient will get an  i-STAT and we help facilitate her getting quickly to the CT scanner.  As her blood pressure is not elevated and she has history of PE with a description of symptoms, I am more concerned about PE then an aneurysm or dissection.  I personally viewed the chest x-ray and did not see evidence of wide mediastinum so we will go with the PE study initially.  Labs returned with normal troponin.  Lactic acid slightly elevated.  Mild leukocytosis.  COVID and flu negative.  I-STAT returned showing normal kidney function, radiology will take her for CT scanner quickly.  10:32 PM I personally viewed the patient's CT scan while in the CT scanner and then reviewed it after reformats and it does look like there is a pulmonary embolism on the left side that appears acute.  Radiology called to also report a left-sided PE without evidence of heart strain.  I informed patient of these findings and given her  Improved but continued chest pain, shortness of breath, and new PE, will call for admission.  Will discuss with medicine if they want to do Eliquis versus heparin.  Patient be admitted for new pulmonary embolism.   Admitting team requested heparin which was ordered via pharmacy.  Patient will be admitted for further management of recurrent pulmonary embolism.         Final Clinical Impression(s) / ED Diagnoses Final diagnoses:  Other acute pulmonary embolism without acute cor pulmonale (HCC)  Pleuritic chest pain  Shortness of breath    Clinical Impression: 1. Other acute pulmonary embolism without acute cor pulmonale (HCC)   2. Pleuritic chest pain   3. Shortness of breath     Disposition: Admit  This note was prepared with assistance of Dragon voice recognition software. Occasional wrong-word or sound-a-like substitutions may have occurred due to the inherent limitations of voice recognition software.      Jerlisa Diliberto, Canary Brim, MD 05/21/21 [phone]

## 2021-05-21 NOTE — H&P (Signed)
?  ?  ?[place] ? ? ?PATIENT NAME: Desiree Leon   ? ?MR#:  [id number] ? ?DATE OF BIRTH:  [birth year] ? ?DATE OF ADMISSION:  05/21/2021 ? ?PRIMARY CARE PHYSICIAN: Burnard Bunting, MD  ? ?Patient is coming from: Home ? ?REQUESTING/REFERRING PHYSICIAN: Tegeler, Gwenyth Allegra, MD  ? ?CHIEF COMPLAINT:  ? ?Chief Complaint  ?Patient presents with  ? Chest Pain  ? ? ?HISTORY OF PRESENT ILLNESS:  ?Desiree Leon is a 81 y.o. Caucasian female with medical history significant for COPD, dyslipidemia, coronary artery disease, osteoarthritis, osteoporosis, PE with previous anticoagulation with Eliquis for a year and hypothyroidism, who presented to the emergency room with acute onset of worsening dyspnea with associated left-sided chest pain since 6:30 PM.  She admits to wheezing without cough.  She denied any fever or chills.  She has been having bilateral lower extremity swelling with no pain.  She describes her chest pain and was dull aching pain and graded initially 12/10 in severity with associated nausea without radiation.  She denies any palpitations.  No bleeding diathesis.  No dysuria, oliguria or hematuria or flank pain. ? ?ED Course: Upon presenting to the emergency room, heart rate was 57 with otherwise normal vital signs.  Labs revealed a glucose of 150 with otherwise unremarkable BMP.  High sensitive troponin I was normal.  Lactic acid was 2.2 and later 1.1.  CBC showed WBC of 10.7 and wants otherwise unremarkable unremarkable. ? ?Portable chest ray showed no acute cardiopulmonary disease.  Chest CTA revealed acute pulmonary embolism involving the distal left main pulmonary artery and extending into the left lingular branches with no evidence for right heart strain.  It showed patchy airspace disease in the lungs probably edema although pneumonia could also have this appearance.  It showed small left pleural effusion with basilar atelectasis. ?EKG as reviewed by me : EKG showed normal sinus rhythm with a rate of 59  with PACs and short PR interval.  It showed nonspecific intraventricular conduction delay. ?Imaging: Chest x-ray showed no acute cardiopulmonary disease. ? ?The patient was given 4 mg of IV morphine sulfate and 500 mill IV normal saline bolus.  She will be admitted to a PCU bed for further evaluation and management. ?PAST MEDICAL HISTORY:  ? ?Past Medical History:  ?Diagnosis Date  ? Anxiety   ? Blood transfusion   ? CAP (community acquired pneumonia)   ? COPD (chronic obstructive pulmonary disease) ([place])   ? Heart murmur   ? History of colon polyps   ? Hyperlipidemia   ? MI (myocardial infarction) ([place])   ? Mitral valve prolapse   ? NSTEMI (non-ST elevated myocardial infarction) ([place])   ? Osteoarthritis   ? Osteoporosis   ? Pulmonary emboli ([place]) 04/12/2019  ? Spinal stenosis   ? Thyroid disease   ? hypothyroidism  ? Unstable angina ([place]) 05/08/2014  ? Venous insufficiency   ? ? ?PAST SURGICAL HISTORY:  ? ?Past Surgical History:  ?Procedure Laterality Date  ? ABDOMINAL HYSTERECTOMY  11/26/1976  ? APPENDECTOMY  02/14/1978  ? BASAL CELL CARCINOMA EXCISION  01/26/1990  ? COLONOSCOPY  1993, 2003, 2008, 2012  ? DILATION AND CURETTAGE OF UTERUS  09/1976  ? LEFT HEART CATHETERIZATION WITH CORONARY ANGIOGRAM N/A 04/18/2014  ? Procedure: LEFT HEART CATHETERIZATION WITH CORONARY ANGIOGRAM;  Surgeon: Peter M Martinique, MD;  Location: [facility] CATH LAB;  Service: Cardiovascular;  Laterality: N/A;  ? MASS EXCISION  10/20/2011  ? Procedure: MINOR EXCISION OF MASS;  Surgeon: Adin Hector,  MD;  Location: [place];  Service: General;  Laterality: N/A;  Excision mass posterior neck  ? OOPHORECTOMY  02/14/1978  ? SHOULDER SURGERY  10/18/2007  ? basal cell  ? SQUAMOUS CELL CARCINOMA EXCISION    ? TONSILLECTOMY  1955  ? TUBAL LIGATION  07/08/74  ? ? ?SOCIAL HISTORY:  ? ?Social History  ? ?Tobacco Use  ? Smoking status: Former  ?  Types: Cigarettes  ?  Quit date: 03/10/2005  ?  Years since quitting: 16.2  ? Smokeless tobacco: Never   ?Substance Use Topics  ? Alcohol use: No  ? ? ?FAMILY HISTORY:  ? ?Family History  ?Problem Relation Age of Onset  ? Heart disease Mother   ? Heart disease Father   ? Cancer Sister   ?     breast  ? Cancer Daughter   ?     cervical and bone  ? Heart disease Sister   ? Thyroid disease Sister   ? ? ?DRUG ALLERGIES:  ? ?Allergies  ?Allergen Reactions  ? Other Anaphylaxis and Cough  ?  fragarances-perfumes  ? Dilaudid [Hydromorphone Hcl] Nausea Only  ? Erythromycin Other (See Comments)  ? ? ?REVIEW OF SYSTEMS:  ? ?ROS ?As per history of present illness. All pertinent systems were reviewed above. Constitutional, HEENT, cardiovascular, respiratory, GI, GU, musculoskeletal, neuro, psychiatric, endocrine, integumentary and hematologic systems were reviewed and are otherwise negative/unremarkable except for positive findings mentioned above in the HPI. ? ? ?MEDICATIONS AT HOME:  ? ?Prior to Admission medications   ?Medication Sig Start Date End Date Taking? Authorizing Provider  ?ACCU-CHEK SMARTVIEW test strip  10/30/17   [provider]  ?acetaminophen (TYLENOL) 500 MG tablet Take 500 mg by mouth every 6 (six) hours as needed for fever or headache.     [provider]  ?ALPRAZolam Duanne Moron) 0.25 MG tablet Take 0.125 mg by mouth at bedtime as needed for anxiety.     [provider]  ?aspirin (ASPIRIN CHILDRENS) 81 MG chewable tablet Chew 1 tablet (81 mg total) by mouth daily. 03/16/20   Adrian Prows, MD  ?budesonide-formoterol (SYMBICORT) 80-4.5 MCG/ACT inhaler Inhale 2 puffs into the lungs 2 (two) times daily. 04/25/21   Freddi Starr, MD  ?cholecalciferol (VITAMIN D) 25 MCG (1000 UNIT) tablet 2 capsules.    [provider]  ?doxycycline (VIBRA-TABS) 100 MG tablet Take 1 tablet by mouth in the morning and at bedtime. 04/23/21   [provider]  ?Elastic Bandages & Supports (O-2 SUSPENSORY) MISC 8 hrs every night 01/08/15   [provider]  ?escitalopram (LEXAPRO) 5 MG  tablet Take 5 mg by mouth every evening.     [provider]  ?glucose blood test strip Accu-Chek SmartView Test Strips ? CHECK BLOOD SUGAR TWICE DAILY.    [provider]  ?iron polysaccharides (NIFEREX) 150 MG capsule Take 150 mg by mouth daily.    [provider]  ?Levothyroxine Sodium 137 MCG CAPS Take 137 mcg by mouth daily before breakfast.  08/25/11   [provider]  ?methocarbamol (ROBAXIN) 500 MG tablet 1 tab TID prn muscle spasms 09/26/19   [provider]  ?metoprolol tartrate (LOPRESSOR) 25 MG tablet Take 25 mg by mouth 2 (two) times daily.  09/12/11   [provider]  ?multivitamin-iron-minerals-folic acid (CENTRUM) chewable tablet Chew 1 tablet by mouth daily.    [provider]  ?niCARdipine (CARDENE) 20 MG capsule Take 20 mg by mouth every evening.  09/12/11  [provider]  ?nitroGLYCERIN (NITRODUR - DOSED IN MG/24 HR) 0.4 mg/hr patch APPLY ONE PATCH ONCE DAILY AS DIRECTED 03/06/21   Adrian Prows, MD  ?nitroGLYCERIN (NITROSTAT) 0.4 MG SL tablet Place 0.4 mg under the tongue every 5 (five) minutes as needed for chest pain.    [provider]  ?OXYGEN Inhale 2 L into the lungs at bedtime.    [provider]  ?pantoprazole (PROTONIX) 40 MG tablet Take 1 tablet (40 mg total) by mouth daily at 12 noon. ?Patient taking differently: Take 40 mg by mouth 2 (two) times daily. 04/21/14   Barton Dubois, MD  ?Polyethyl Glycol-Propyl Glycol (SYSTANE) 0.4-0.3 % SOLN Place 1 drop into both eyes See admin instructions. Once to twice daily as needed for dry eyes    [provider]  ?predniSONE (DELTASONE) 10 MG tablet Take 10 mg by mouth daily. Take 1 tablet once daily for the next 5 days. 04/23/21   [provider]  ?Probiotic Product (ALIGN) 4 MG CAPS Take 4 mg by mouth daily.    [provider]  ?ranolazine (RANEXA) 500 MG 12 hr tablet TAKE ONE TABLET BY MOUTH EVERY TWELVE HOURS 04/19/21   Cantwell,  Celeste C, PA-C  ?VYTORIN 10-20 MG per tablet Take 1 tablet by mouth daily at 6 PM.  09/18/11   [provider]  ?fluticasone (FLONASE) 50 MCG/ACT nasal spray Place 2 sprays into both nostrils daily. 1/25/2

## 2021-05-21 NOTE — ED Notes (Signed)
Patient transported to CT scan with RN.

## 2021-05-21 NOTE — ED Triage Notes (Signed)
Patient arrived with EMS from home reports central chest pain with SOB radiating to mid back this evening , no emesis or diaphoresis , history of MI , her cardiologist is Dr. Filbert Schilder , she received 2 NTG sl and ASA 324 mg tabs prior to arrival with no relief.  ?

## 2021-05-21 NOTE — Progress Notes (Signed)
ANTICOAGULATION CONSULT NOTE - Initial Consult ? ?Pharmacy Consult for heparin ?Indication: pulmonary embolus ? ?Allergies  ?Allergen Reactions  ? Other Anaphylaxis and Cough  ?  fragarances-perfumes  ? Dilaudid [Hydromorphone Hcl] Nausea Only  ? Erythromycin Other (See Comments)  ? ? ?Patient Measurements: ?Height: '5\' 4"'$  (162.6 cm) ?Weight: 92 kg (202 lb 13.2 oz) ?IBW/kg (Calculated) : 54.7 ?Heparin Dosing Weight: 76kg ? ?Vital Signs: ?BP: 127/70 (03/14 2215) ?Pulse Rate: 58 (03/14 2215) ? ?Labs: ?Recent Labs  ?  05/21/21 ?2131 05/21/21 ?2151  ?HGB 12.6 13.3  ?HCT 37.2 39.0  ?PLT 277  --   ?LABPROT 13.0  --   ?INR 1.0  --   ?CREATININE 0.81 0.70  ?TROPONINIHS 6  --   ? ? ?Estimated Creatinine Clearance: 60.6 mL/min (by C-G formula based on SCr of 0.7 mg/dL). ? ? ?Medical History: ?Past Medical History:  ?Diagnosis Date  ? Anxiety   ? Blood transfusion   ? CAP (community acquired pneumonia)   ? COPD (chronic obstructive pulmonary disease) ([place])   ? Heart murmur   ? History of colon polyps   ? Hyperlipidemia   ? MI (myocardial infarction) ([place])   ? Mitral valve prolapse   ? NSTEMI (non-ST elevated myocardial infarction) ([place])   ? Osteoarthritis   ? Osteoporosis   ? Pulmonary emboli ([place]) 04/12/2019  ? Spinal stenosis   ? Thyroid disease   ? hypothyroidism  ? Unstable angina ([place]) 05/08/2014  ? Venous insufficiency   ? ? ?Assessment: ?85 YOF presenting with CP and SOB, CT positive for PE, no RHS.  She has hx of same and hx on Eliquis in 2022 which she is no longer on.   ? ?Goal of Therapy:  ?Heparin level 0.3-0.7 units/ml ?Monitor platelets by anticoagulation protocol: Yes ?  ?Plan:  ?Heparin 4500 units IV x 1, and gtt at 1200 units/hr ?F/u 8 hour heparin level ? ?Bertis Ruddy, PharmD ?Clinical Pharmacist ?ED Pharmacist Phone # [phone] ?05/21/2021 11:14 PM ? ? ? ?

## 2021-05-22 ENCOUNTER — Other Ambulatory Visit: Payer: Self-pay | Admitting: Cardiology

## 2021-05-22 ENCOUNTER — Inpatient Hospital Stay (HOSPITAL_COMMUNITY): Payer: Medicare PPO

## 2021-05-22 DIAGNOSIS — M7989 Other specified soft tissue disorders: Secondary | ICD-10-CM

## 2021-05-22 DIAGNOSIS — F32A Depression, unspecified: Secondary | ICD-10-CM

## 2021-05-22 DIAGNOSIS — R079 Chest pain, unspecified: Secondary | ICD-10-CM | POA: Diagnosis not present

## 2021-05-22 DIAGNOSIS — I2699 Other pulmonary embolism without acute cor pulmonale: Secondary | ICD-10-CM | POA: Diagnosis not present

## 2021-05-22 DIAGNOSIS — R609 Edema, unspecified: Secondary | ICD-10-CM

## 2021-05-22 LAB — BASIC METABOLIC PANEL
Anion gap: 9 (ref 5–15)
BUN: 14 mg/dL (ref 8–23)
CO2: 25 mmol/L (ref 22–32)
Calcium: 8.4 mg/dL — ABNORMAL LOW (ref 8.9–10.3)
Chloride: 107 mmol/L (ref 98–111)
Creatinine, Ser: 0.78 mg/dL (ref 0.44–1.00)
GFR, Estimated: >60 mL/min (ref >60)
Glucose, Bld: 138 mg/dL — ABNORMAL HIGH (ref 70–99)
Potassium: 4.4 mmol/L (ref 3.5–5.1)
Sodium: 141 mmol/L (ref 135–145)

## 2021-05-22 LAB — ECHOCARDIOGRAM COMPLETE
AR max vel: 2.75 cm2
AV Area VTI: 2.58 cm2
AV Area mean vel: 2.56 cm2
AV Mean grad: 4 mmHg
AV Peak grad: 7.5 mmHg
Ao pk vel: 1.37 m/s
Area-P 1/2: 2.45 cm2
Height: 64 in
S' Lateral: 3.1 cm
Weight: 3245.17 oz

## 2021-05-22 LAB — CBC
HCT: 34.5 % — ABNORMAL LOW (ref 36.0–46.0)
Hemoglobin: 11.3 g/dL — ABNORMAL LOW (ref 12.0–15.0)
MCH: 32.6 pg (ref 26.0–34.0)
MCHC: 32.8 g/dL (ref 30.0–36.0)
MCV: 99.4 fL (ref 80.0–100.0)
Platelets: 245 10*3/uL (ref 150–400)
RBC: 3.47 MIL/uL — ABNORMAL LOW (ref 3.87–5.11)
RDW: 13.8 % (ref 11.5–15.5)
WBC: 10.7 10*3/uL — ABNORMAL HIGH (ref 4.0–10.5)
nRBC: 0 % (ref 0.0–0.2)

## 2021-05-22 LAB — LACTIC ACID, PLASMA: Lactic Acid, Venous: 1.1 mmol/L (ref 0.5–1.9)

## 2021-05-22 LAB — HEPARIN LEVEL (UNFRACTIONATED): Heparin Unfractionated: >1.1 IU/mL — ABNORMAL HIGH (ref 0.30–0.70)

## 2021-05-22 LAB — GLUCOSE, CAPILLARY
Glucose-Capillary: 180 mg/dL — ABNORMAL HIGH (ref 70–99)
Glucose-Capillary: 150 mg/dL — ABNORMAL HIGH (ref 70–99)
Glucose-Capillary: 153 mg/dL — ABNORMAL HIGH (ref 70–99)

## 2021-05-22 LAB — TROPONIN I (HIGH SENSITIVITY): Troponin I (High Sensitivity): 7 ng/L (ref <18)

## 2021-05-22 MED ORDER — PNEUMOCOCCAL 20-VAL CONJ VACC 0.5 ML IM SUSY
0.5000 mL | PREFILLED_SYRINGE | INTRAMUSCULAR | Status: AC
  Administered 2021-05-23: 0.5 mL via INTRAMUSCULAR
  Filled 2021-05-22: qty 0.5

## 2021-05-22 MED ORDER — SIMVASTATIN 20 MG PO TABS
20.0000 mg | ORAL_TABLET | Freq: Every day | ORAL | Status: DC
Start: 2021-05-22 — End: 2021-05-23
  Administered 2021-05-22: 20 mg via ORAL
  Filled 2021-05-22: qty 1

## 2021-05-22 MED ORDER — NITROGLYCERIN 0.4 MG/HR TD PT24
0.4000 mg | MEDICATED_PATCH | Freq: Every day | TRANSDERMAL | Status: DC
  Administered 2021-05-22 – 2021-05-23 (×2): 0.4 mg via TRANSDERMAL
  Filled 2021-05-22 (×2): qty 1

## 2021-05-22 MED ORDER — ENOXAPARIN SODIUM 100 MG/ML IJ SOSY
90.0000 mg | PREFILLED_SYRINGE | Freq: Two times a day (BID) | INTRAMUSCULAR | Status: DC
  Administered 2021-05-22 (×2): 90 mg via SUBCUTANEOUS
  Filled 2021-05-22: qty 1
  Filled 2021-05-22: qty 0.9

## 2021-05-22 MED ORDER — EZETIMIBE 10 MG PO TABS
10.0000 mg | ORAL_TABLET | Freq: Every evening | ORAL | Status: DC
  Administered 2021-05-22: 10 mg via ORAL
  Filled 2021-05-22 (×2): qty 1

## 2021-05-22 NOTE — Assessment & Plan Note (Signed)
-   We will renew Lexapro. ?

## 2021-05-22 NOTE — ED Notes (Signed)
Pt to echo.

## 2021-05-22 NOTE — Assessment & Plan Note (Signed)
-   We will continue her antihypertensives. 

## 2021-05-22 NOTE — Assessment & Plan Note (Signed)
-   We will continue Synthroid. 

## 2021-05-22 NOTE — Progress Notes (Signed)
ANTICOAGULATION CONSULT NOTE - Initial Consult ? ?Pharmacy Consult for heparin>>lovenox ?Indication: pulmonary embolus ? ?Allergies  ?Allergen Reactions  ? Other Anaphylaxis and Cough  ?  fragarances-perfumes  ? Dilaudid [Hydromorphone Hcl] Nausea Only  ? Erythromycin Other (See Comments)  ? ? ?Patient Measurements: ?Height: '5\' 4"'$  (162.6 cm) ?Weight: 92 kg (202 lb 13.2 oz) ?IBW/kg (Calculated) : 54.7 ?Heparin Dosing Weight: 76kg ? ?Vital Signs: ?BP: 142/59 (03/15 0645) ?Pulse Rate: 48 (03/15 0645) ? ?Labs: ?Recent Labs  ?  05/21/21 ?2131 05/21/21 ?2151 05/21/21 ?2350 05/22/21 ?0443 05/22/21 ?0814  ?HGB 12.6 13.3  --  11.3*  --   ?HCT 37.2 39.0  --  34.5*  --   ?PLT 277  --   --  245  --   ?LABPROT 13.0  --   --   --   --   ?INR 1.0  --   --   --   --   ?HEPARINUNFRC  --   --   --   --  >1.10*  ?CREATININE 0.81 0.70  --  0.78  --   ?TROPONINIHS 6  --  7  --   --   ? ? ? ?Estimated Creatinine Clearance: 60.6 mL/min (by C-G formula based on SCr of 0.78 mg/dL). ? ? ?Assessment: ?40 YOF presenting with CP and SOB, CT positive for PE, no RHS.  She has hx of same and hx on Eliquis in 2022 which she is no longer on. Initial heparin level was very elevated but may have been drawn incorrectly. However now switching to lovenox. No bleeding noted.  ? ?Goal of Therapy:  ?Anti-Xa level 0.6-1 units/ml 4hrs after LMWH dose given ?Monitor platelets by anticoagulation protocol: Yes ?  ?Plan:  ?DC heparin drip ?Start lovenox '90mg'$  SQ Q12H ?F/u renal fxn, CBC, S&S of bleeding ? ?Salome Arnt, PharmD, BCPS ?Clinical Pharmacist ?Please see AMION for all pharmacy numbers ?05/22/2021 10:02 AM ? ? ? ? ?

## 2021-05-22 NOTE — ED Notes (Signed)
Breakfast order placed ?

## 2021-05-22 NOTE — ED Notes (Signed)
MD notified MD Dante Gang, pt requesting 0.'4mg'$  nitro patch which she takes at home. Medication is not on scheduled list. Awaiting response ? ?

## 2021-05-22 NOTE — Assessment & Plan Note (Signed)
-   We will continue her Vytorin. ?

## 2021-05-22 NOTE — Progress Notes (Signed)
?PROGRESS NOTE ? ? ? ?Desiree Leon  ATF:[id number] DOB: 1940/05/05 DOA: 05/21/2021 ?PCP: Burnard Bunting, MD  ? ? ?Brief Narrative:  ?81 year old with history of COPD, chronic hypoxemia on 2 to 3 L of oxygen at home, hypertension dyslipidemia, coronary artery disease previous pulmonary embolism treated for 1 year in 2021 presented to emergency room with acute left-sided pleuritic chest pain.  In the emergency room hemodynamically stable.  Chest x-ray was normal.  Chest CT revealed acute pulmonary embolism.  Left main pulmonary artery and extending to the left pulmonary branches with no evidence of right heart strain.  Admitted to the hospital with acute lobar pulmonary embolism with pleuritic chest pain. ? ? ?Assessment & Plan: ?  ?Acute left main pulmonary artery embolism: Without cor pulmonale.  Patient with multiple comorbidities. ?Check echocardiogram to look for any intraventricular thrombus. ?Check lower extremity duplexes to look for any DVTs. ?Primary cause unknown but likely secondary lifestyle illness mobility from multiple medical issues. ?Currently on heparin infusion, will convert to Lovenox and monitored hospital next 24 hours to ensure stabilization before changing to oral anticoagulation with apixaban. ?Patient has made informed decision to go on oral anticoagulation with apixaban, she will need lifelong anticoagulation. ?-NSAIDs.  Will use morphine as needed for pain control. ? ?Coronary artery disease: Currently stable.  Patient does have coronary stents.  Will continue aspirin along with Eliquis now.  We will send referral to her cardiology for follow-up. ?Patient to continue beta-blockers, statin, Ranexa, nitroglycerin and Zetia. ? ?COPD with chronic hypoxemia: Stable.  As needed bronchodilators.  She uses 2 L oxygen. ?We will mobilize and monitor for oxygenation need. ? ?Hypothyroidism: Clinically euthyroid frequently.  Synthroid. ? ?Essential hypertension: Blood pressure stable on current  regimen.  Continue. ? ? ? ?DVT prophylaxis:   Lovenox subcu ? ? ?Code Status: Partial.  Okay for CPR.  No intubation. ?Family Communication: Daughter at the bedside. ?Disposition Plan: Status is: Inpatient ?Remains inpatient appropriate because: Significant clot burden comorbidities ?  ? ? ?Consultants:  ?Plan ? ?Procedures:  ? ? ? ?Antimicrobials:  ?None ? ? ?Subjective: ?Patient seen and examined.  Still has mild to moderate left precordial pain, worse with breathing and mobility.  Better than last night.  Remains on 2 L oxygen which she uses at home.  Denies any nausea vomiting.  Tolerating heparin.  Discussed about changing to Lovenox tonight and ultimately going on apixaban and patient agrees. ? ?Objective: ?Vitals:  ? 05/22/21 0900 05/22/21 1131 05/22/21 1141 05/22/21 1200  ?BP: (!) 147/55 (!) 137/53  (!) 144/54  ?Pulse: (!) 51 (!) 52    ?Resp: '15 16  15  '$ ?Temp:   98.5 ?F (36.9 ?C)   ?TempSrc:   Axillary   ?SpO2: 100% 99%    ?Weight:      ?Height:      ? ? ?Intake/Output Summary (Last 24 hours) at 05/22/2021 1352 ?Last data filed at 05/21/2021 2232 ?Gross per 24 hour  ?Intake 500 ml  ?Output --  ?Net 500 ml  ? ?Filed Weights  ? 05/21/21 2118  ?Weight: 92 kg  ? ? ?Examination: ? ?General exam: Appears calm and comfortable  ?Able to talk in full sentences.  Currently on 2 L oxygen. ?Respiratory system: No added sounds. ?Cardiovascular system: S1 & S2 heard, RRR. No JVD, murmurs, rubs, gallops or clicks.  ?Bilateral pedal edema mostly chronic.  Left more than right.  Mildly tender left ankle. ?Gastrointestinal system: Abdomen is nondistended, soft and nontender. No organomegaly or masses  felt. Normal bowel sounds heard. ?Central nervous system: Alert and oriented. No focal neurological deficits. ?Extremities: Symmetric 5 x 5 power. ?Skin: No rashes, lesions or ulcers ?Psychiatry: Judgement and insight appear normal. Mood & affect appropriate.  ? ? ? ?Data Reviewed: I have personally reviewed following labs and  imaging studies ? ?CBC: ?Recent Labs  ?Lab 05/21/21 ?2131 05/21/21 ?2151 05/22/21 ?0443  ?WBC 10.7*  --  10.7*  ?NEUTROABS 6.3  --   --   ?HGB 12.6 13.3 11.3*  ?HCT 37.2 39.0 34.5*  ?MCV 95.9  --  99.4  ?PLT 277  --  245  ? ?Basic Metabolic Panel: ?Recent Labs  ?Lab 05/21/21 ?2131 05/21/21 ?2151 05/22/21 ?0443  ?NA 136 137 141  ?K 4.6 4.5 4.4  ?CL 101 103 107  ?CO2 24  --  25  ?GLUCOSE 158* 150* 138*  ?BUN '19 19 14  '$ ?CREATININE 0.81 0.70 0.78  ?CALCIUM 9.4  --  8.4*  ? ?GFR: ?Estimated Creatinine Clearance: 60.6 mL/min (by C-G formula based on SCr of 0.78 mg/dL). ?Liver Function Tests: ?Recent Labs  ?Lab 05/21/21 ?2131  ?AST 16  ?ALT 13  ?ALKPHOS 81  ?BILITOT 0.5  ?PROT 6.5  ?ALBUMIN 3.7  ? ?No results for input(s): LIPASE, AMYLASE in the last 168 hours. ?No results for input(s): AMMONIA in the last 168 hours. ?Coagulation Profile: ?Recent Labs  ?Lab 05/21/21 ?2131  ?INR 1.0  ? ?Cardiac Enzymes: ?No results for input(s): CKTOTAL, CKMB, CKMBINDEX, TROPONINI in the last 168 hours. ?BNP (last 3 results) ?No results for input(s): PROBNP in the last 8760 hours. ?HbA1C: ?No results for input(s): HGBA1C in the last 72 hours. ?CBG: ?No results for input(s): GLUCAP in the last 168 hours. ?Lipid Profile: ?No results for input(s): CHOL, HDL, LDLCALC, TRIG, CHOLHDL, LDLDIRECT in the last 72 hours. ?Thyroid Function Tests: ?No results for input(s): TSH, T4TOTAL, FREET4, T3FREE, THYROIDAB in the last 72 hours. ?Anemia Panel: ?No results for input(s): VITAMINB12, FOLATE, FERRITIN, TIBC, IRON, RETICCTPCT in the last 72 hours. ?Sepsis Labs: ?Recent Labs  ?Lab 05/21/21 ?2135 05/21/21 ?2350  ?LATICACIDVEN 2.2* 1.1  ? ? ?Recent Results (from the past 240 hour(s))  ?Resp Panel by RT-PCR (Flu A&B, Covid) Nasopharyngeal Swab     Status: None  ? Collection Time: 05/21/21  9:19 PM  ? Specimen: Nasopharyngeal Swab; Nasopharyngeal(NP) swabs in vial transport medium  ?Result Value Ref Range Status  ? SARS Coronavirus 2 by RT PCR NEGATIVE  NEGATIVE Final  ?  Comment: (NOTE) ?SARS-CoV-2 target nucleic acids are NOT DETECTED. ? ?The SARS-CoV-2 RNA is generally detectable in upper respiratory ?specimens during the acute phase of infection. The lowest ?concentration of SARS-CoV-2 viral copies this assay can detect is ?138 copies/mL. A negative result does not preclude SARS-Cov-2 ?infection and should not be used as the sole basis for treatment or ?other patient management decisions. A negative result may occur with  ?improper specimen collection/handling, submission of specimen other ?than nasopharyngeal swab, presence of viral mutation(s) within the ?areas targeted by this assay, and inadequate number of viral ?copies(<138 copies/mL). A negative result must be combined with ?clinical observations, patient history, and epidemiological ?information. The expected result is Negative. ? ?Fact Sheet for Patients:  ?EntrepreneurPulse.com.au ? ?Fact Sheet for Healthcare Providers:  ?IncredibleEmployment.be ? ?This test is no t yet approved or cleared by the [place] FDA and  ?has been authorized for detection and/or diagnosis of SARS-CoV-2 by ?FDA under an Emergency Use Authorization (EUA). This EUA will remain  ?in effect (  meaning this test can be used) for the duration of the ?COVID-19 declaration under Section 564(b)(1) of the Act, 21 ?U.S.C.section 360bbb-3(b)(1), unless the authorization is terminated  ?or revoked sooner.  ? ? ?  ? Influenza A by PCR NEGATIVE NEGATIVE Final  ? Influenza B by PCR NEGATIVE NEGATIVE Final  ?  Comment: (NOTE) ?The Xpert Xpress SARS-CoV-2/FLU/RSV plus assay is intended as an aid ?in the diagnosis of influenza from Nasopharyngeal swab specimens and ?should not be used as a sole basis for treatment. Nasal washings and ?aspirates are unacceptable for Xpert Xpress SARS-CoV-2/FLU/RSV ?testing. ? ?Fact Sheet for Patients: ?EntrepreneurPulse.com.au ? ?Fact Sheet for Healthcare  Providers: ?IncredibleEmployment.be ? ?This test is not yet approved or cleared by the [place] FDA and ?has been authorized for detection and/or diagnosis of SARS-CoV-2 by ?FDA under an Emerg

## 2021-05-22 NOTE — Assessment & Plan Note (Addendum)
-   The patient will be admitted to a PCU bed. ?- The patient likely has associated left sided pleuritic chest pain. ?- We will continue anticoagulation with IV heparin. ?- She can be discharged on p.o. Eliquis that she will likely continue indefinitely given recurrence of her pulmonary embolism. ?- Obtain a 2D echo and bilateral lower extremity venous Doppler. ?

## 2021-05-22 NOTE — Progress Notes (Signed)
Echocardiogram ?2D Echocardiogram has been performed. ? ?Desiree Leon ?05/22/2021, 11:18 AM ?

## 2021-05-22 NOTE — Assessment & Plan Note (Signed)
Continue PPI therapy. 

## 2021-05-22 NOTE — ED Notes (Signed)
Patient transported to vascular. 

## 2021-05-22 NOTE — ED Notes (Signed)
Heparin level sent  ? ?

## 2021-05-22 NOTE — Plan of Care (Signed)

## 2021-05-22 NOTE — Progress Notes (Signed)
Lower extremity venous has been completed.  ? ?Preliminary results in CV Proc.  ? ?Desiree Leon ?05/22/2021 2:06 PM    ?

## 2021-05-23 ENCOUNTER — Other Ambulatory Visit (HOSPITAL_COMMUNITY): Payer: Self-pay

## 2021-05-23 LAB — GLUCOSE, CAPILLARY: Glucose-Capillary: 134 mg/dL — ABNORMAL HIGH (ref 70–99)

## 2021-05-23 MED ORDER — APIXABAN 5 MG PO TABS
10.0000 mg | ORAL_TABLET | Freq: Two times a day (BID) | ORAL | Status: DC
Start: 2021-05-23 — End: 2021-05-23
  Administered 2021-05-23: 10 mg via ORAL
  Filled 2021-05-23: qty 2

## 2021-05-23 MED ORDER — APIXABAN 5 MG PO TABS: 5.0000 mg | ORAL_TABLET | Freq: Two times a day (BID) | ORAL | 1 refills | Status: DC

## 2021-05-23 MED ORDER — APIXABAN (ELIQUIS) VTE STARTER PACK (10MG AND 5MG)
ORAL_TABLET | ORAL | 2 refills | Status: DC
  Filled 2021-05-23: qty 74, 30d supply, fill #0

## 2021-05-23 MED ORDER — MORPHINE SULFATE (PF) 2 MG/ML IV SOLN
1.0000 mg | Freq: Once | INTRAVENOUS | Status: AC | PRN
  Administered 2021-05-23: 1 mg via INTRAVENOUS
  Filled 2021-05-23: qty 1

## 2021-05-23 MED ORDER — OXYCODONE HCL 5 MG PO TABS
5.0000 mg | ORAL_TABLET | Freq: Four times a day (QID) | ORAL | 0 refills | Status: AC | PRN
Start: 2021-05-23 — End: 2021-05-28
  Filled 2021-05-23: qty 10, 3d supply, fill #0

## 2021-05-23 MED ORDER — APIXABAN 5 MG PO TABS: 5.0000 mg | ORAL_TABLET | Freq: Two times a day (BID) | ORAL | Status: DC

## 2021-05-23 NOTE — Discharge Summary (Signed)
Physician Discharge Summary  ?RAYELLE ARMOR FXT:[id number] DOB: [birth year] DOA: 05/21/2021 ? ?PCP: Burnard Bunting, MD ? ?Admit date: 05/21/2021 ?Discharge date: 05/23/2021 ? ?Admitted From: Home ?Disposition: Home ? ?Recommendations for Outpatient Follow-up:  ?Follow up with PCP in 1-2 weeks ?Cardiology will schedule follow-up. ? ?Home Health: N/A ?Equipment/Devices: N/A ? ?Discharge Condition: Stable ?CODE STATUS: Partial.  DNI. ?Diet recommendation: Low-salt diet. ? ?Discharge summary: ?81 year old with history of COPD, chronic hypoxemia on 2 to 3 L of oxygen at home, hypertension, dyslipidemia, coronary artery disease and chronic angina, previous pulmonary embolism treated for 1 year in 2021 presented to emergency room with acute left-sided pleuritic chest pain.  In the emergency room hemodynamically stable.  Chest x-ray was normal.  Chest CT revealed acute pulmonary embolism.  Left main pulmonary artery and extending to the left pulmonary branches with no evidence of right heart strain.  Admitted to the hospital with acute lobar pulmonary embolism with pleuritic chest pain. ?  ?  ?Acute left main pulmonary artery embolism: Without cor pulmonale.  Patient with multiple comorbidities. ?Echocardiogram, essentially normal.  Normal ejection fraction.  No right ventricular strain.  No intracardiac thrombus. ?Duplex is lower extremity, right popliteal chronic thrombus. ?Primary cause unknown but likely secondary lifestyle illness mobility from multiple medical issues. ?Treated with heparin infusion and Lovenox in the hospital for 2 days.  Remained hemodynamically stable.  Discharging with oral anticoagulation with Eliquis. ?Patient has made informed decision to go on oral anticoagulation with apixaban, she will need lifelong anticoagulation. ?Intermittently getting left sided pleuritic pain managed with pain medications.  Will prescribe short course of oxycodone to take along with Tylenol for moderate to severe  pain. ? ?Coronary artery disease: Currently stable.  Patient does have coronary stents.  ?Does have significant coronary artery disease, seen by cardiology.  Currently no evidence of acute coronary syndrome. ?Patient to continue beta-blockers, statin, Ranexa, nitroglycerin and Zetia. ?  ?COPD with chronic hypoxemia: Stable.  As needed bronchodilators.  She uses 2 L oxygen. ?She will continue to use oxygen. ?  ?Hypothyroidism: Clinically euthyroid frequently.  Synthroid. ?  ?Essential hypertension: Blood pressure stable on current regimen.  Continue. ? ?Medically stabilized.  Able to go home. ? ? ?Discharge Diagnoses:  ?Principal Problem: ?  Acute pulmonary embolism ([place]) ?Active Problems: ?  Dyslipidemia ?  Hypothyroidism ?  Essential hypertension ?  Gastro-esophageal reflux disease without esophagitis ?  Depression ? ? ? ?Discharge Instructions ? ?Discharge Instructions   ? ? Call MD for:  difficulty breathing, headache or visual disturbances   Complete by: As directed ?  ? Call MD for:  severe uncontrolled pain   Complete by: As directed ?  ? Diet - low sodium heart healthy   Complete by: As directed ?  ? Increase activity slowly   Complete by: As directed ?  ? ?  ? ?Allergies as of 05/23/2021   ? ?   Reactions  ? Other Anaphylaxis, Cough  ? fragarances-perfumes  ? Dilaudid [hydromorphone Hcl] Nausea Only  ? Erythromycin Other (See Comments)  ? ?  ? ?  ?Medication List  ?  ? ?STOP taking these medications   ? ?aspirin 81 MG chewable tablet ?Commonly known as: Aspirin Childrens ?  ?predniSONE 10 MG tablet ?Commonly known as: DELTASONE ?  ? ?  ? ?TAKE these medications   ? ?glucose blood test strip ?Accu-Chek SmartView Test Strips ? CHECK BLOOD SUGAR TWICE DAILY. ?  ?Accu-Chek SmartView test strip ?Generic drug: glucose blood ?  ?acetaminophen  500 MG tablet ?Commonly known as: TYLENOL ?Take 500 mg by mouth every 6 (six) hours as needed for fever or headache. ?  ?Align 4 MG Caps ?Take 4 mg by mouth daily. ?   ?ALPRAZolam 0.25 MG tablet ?Commonly known as: Duanne Moron ?Take 0.125 mg by mouth at bedtime as needed for anxiety. ?  ?Apixaban Starter Pack ('10mg'$  and '5mg'$ ) ?Commonly known as: ELIQUIS STARTER PACK ?Take as directed on package: start with two-'5mg'$  tablets twice daily for 7 days. On day 8, switch to one-'5mg'$  tablet twice daily. ?  ?budesonide-formoterol 80-4.5 MCG/ACT inhaler ?Commonly known as: SYMBICORT ?Inhale 2 puffs into the lungs 2 (two) times daily. ?  ?cholecalciferol 25 MCG (1000 UNIT) tablet ?Commonly known as: VITAMIN D ?2 capsules. ?  ?COLLAGEN 1500/C PO ?Take 1 capsule by mouth daily. ?  ?escitalopram 5 MG tablet ?Commonly known as: LEXAPRO ?Take 5 mg by mouth every evening. ?  ?iron polysaccharides 150 MG capsule ?Commonly known as: NIFEREX ?Take 150 mg by mouth daily. ?  ?Levothyroxine Sodium 137 MCG Caps ?Take 137 mcg by mouth daily before breakfast. ?  ?methocarbamol 500 MG tablet ?Commonly known as: ROBAXIN ?Take 500 mg by mouth every 8 (eight) hours as needed for muscle spasms. ?  ?metoprolol tartrate 25 MG tablet ?Commonly known as: LOPRESSOR ?Take 25 mg by mouth 2 (two) times daily. ?  ?multivitamin-iron-minerals-folic acid chewable tablet ?Chew 1 tablet by mouth daily. ?  ?niCARdipine 20 MG capsule ?Commonly known as: CARDENE ?Take 20 mg by mouth every evening. ?  ?nitroGLYCERIN 0.4 MG SL tablet ?Commonly known as: NITROSTAT ?Place 0.4 mg under the tongue every 5 (five) minutes as needed for chest pain. ?What changed: Another medication with the same name was changed. Make sure you understand how and when to take each. ?  ?nitroGLYCERIN 0.4 mg/hr patch ?Commonly known as: NITRODUR - Dosed in mg/24 hr ?APPLY ONE PATCH ONCE DAILY AS DIRECTED ?What changed: See the new instructions. ?  ?O-2 Suspensory Misc ?8 hrs every night ?  ?oxyCODONE 5 MG immediate release tablet ?Commonly known as: Roxicodone ?Take 1 tablet (5 mg total) by mouth every 6 (six) hours as needed for up to 5 days for breakthrough pain  or severe pain. ?  ?OXYGEN ?Inhale 2 L into the lungs at bedtime. ?  ?pantoprazole 40 MG tablet ?Commonly known as: PROTONIX ?Take 1 tablet (40 mg total) by mouth daily at 12 noon. ?What changed: when to take this ?  ?ranolazine 500 MG 12 hr tablet ?Commonly known as: RANEXA ?TAKE ONE TABLET BY MOUTH EVERY TWELVE HOURS ?What changed:  ?how to take this ?when to take this ?  ?Systane 0.4-0.3 % Soln ?Generic drug: Polyethyl Glycol-Propyl Glycol ?Place 1 drop into both eyes 2 (two) times daily as needed (dry eyes). ?  ?Vytorin 10-20 MG tablet ?Generic drug: ezetimibe-simvastatin ?Take 1 tablet by mouth daily at 6 PM. ?  ? ?  ? ? Follow-up Information   ? ? Cantwell, Celeste C, PA-C Follow up on 10/14/2021.   ?Specialty: Cardiology ?Why: 10/14/2021 9:00 AM (Arrive by 8:45 AM) ?Contact information: ?[address] ?[address] ?[place] [address] ?[phone] ? ? ?  ?  ? ?  ?  ? ?  ? ?Allergies  ?Allergen Reactions  ? Other Anaphylaxis and Cough  ?  fragarances-perfumes  ? Dilaudid [Hydromorphone Hcl] Nausea Only  ? Erythromycin Other (See Comments)  ? ? ?Consultations: ?Cardiology ? ? ?Procedures/Studies: ?CT Angio Chest PE W and/or Wo Contrast ? ?Result  Date: 05/21/2021 ?CLINICAL DATA:  Pulmonary embolus suspected with high probability. EXAM: CT ANGIOGRAPHY CHEST WITH CONTRAST TECHNIQUE: Multidetector CT imaging of the chest was performed using the standard protocol during bolus administration of intravenous contrast. Multiplanar CT image reconstructions and MIPs were obtained to evaluate the vascular anatomy. RADIATION DOSE REDUCTION: This exam was performed according to the departmental dose-optimization program which includes automated exposure control, adjustment of the mA and/or kV according to patient size and/or use of iterative reconstruction technique. CONTRAST:  82m OMNIPAQUE IOHEXOL 350 MG/ML SOLN COMPARISON:  04/11/2019 FINDINGS: Cardiovascular: There is good opacification of the central and segmental pulmonary  arteries. Filling defects are demonstrated in the distal left main pulmonary artery, extending into left lingular branches consistent with acute pulmonary embolus. RV to LV ratio is normal at 0.8. Mild cardiac enlargeme

## 2021-05-23 NOTE — TOC Benefit Eligibility Note (Signed)
Patient Advocate Encounter ? ?Insurance verification completed.   ? ?The patient is currently admitted and upon discharge could be taking Eliquis 5 mg. ? ?The current 30 day co-pay is, $40.00.  ? ?The patient is insured through [employer] Medicare Part D  ? ? ? ?Sherri Mcarthy, CPhT ?Pharmacy Patient Advocate Specialist ?[facility] Patient Advocate Team ?Direct Number: [phone]  Fax: [phone] ? ? ? ? ? ?  ?

## 2021-05-23 NOTE — Plan of Care (Signed)

## 2021-05-23 NOTE — Discharge Instructions (Signed)
Information on my medicine - ELIQUIS? (apixaban) ? ?Why was Eliquis? prescribed for you? ?Eliquis? was prescribed to treat blood clots that may have been found in the veins of your legs (deep vein thrombosis) or in your lungs (pulmonary embolism) and to reduce the risk of them occurring again. ? ?What do You need to know about Eliquis? ? ?The starting dose is 10 mg (two 5 mg tablets) taken TWICE daily for the FIRST SEVEN (7) DAYS, then on 05/30/2021 the dose is reduced to ONE 5 mg tablet taken TWICE daily.  Eliquis? may be taken with or without food.  ? ?Try to take the dose about the same time in the morning and in the evening. If you have difficulty swallowing the tablet whole please discuss with your pharmacist how to take the medication safely. ? ?Take Eliquis? exactly as prescribed and DO NOT stop taking Eliquis? without talking to the doctor who prescribed the medication.  Stopping may increase your risk of developing a new blood clot.  Refill your prescription before you run out. ? ?After discharge, you should have regular check-up appointments with your healthcare provider that is prescribing your Eliquis?. ?   ?What do you do if you miss a dose? ?If a dose of ELIQUIS? is not taken at the scheduled time, take it as soon as possible on the same day and twice-daily administration should be resumed. The dose should not be doubled to make up for a missed dose. ? ?Important Safety Information ?A possible side effect of Eliquis? is bleeding. You should call your healthcare provider right away if you experience any of the following: ?Bleeding from an injury or your nose that does not stop. ?Unusual colored urine (red or dark brown) or unusual colored stools (red or black). ?Unusual bruising for unknown reasons. ?A serious fall or if you hit your head (even if there is no bleeding). ? ?Some medicines may interact with Eliquis? and might increase your risk of bleeding or clotting while on Eliquis?Marland Kitchen To help avoid this,  consult your healthcare provider or pharmacist prior to using any new prescription or non-prescription medications, including herbals, vitamins, non-steroidal anti-inflammatory drugs (NSAIDs) and supplements. ? ?This website has more information on Eliquis? (apixaban): http://www.eliquis.com/eliquis/home  ?

## 2021-05-23 NOTE — Consult Note (Signed)
CARDIOLOGY CONSULT NOTE  ?Patient ID: ?Desiree Leon ?MRN: [id number] ?DOB/AGE: Jun 21, 1940 81 y.o. ? ?Admit date: 05/21/2021 ?Referring Physician  Jodi Marble, MD ?Primary Physician:  Burnard Bunting, MD ?Reason for Consultation  Chest pain ? ?Patient ID: Desiree Leon, female    DOB: 07-31-1940, 81 y.o.   MRN: [id number] ? ?Chief Complaint  ?Patient presents with  ? Chest Pain  ? ?HPI:   ? ?Desiree Leon  is a 81 y.o. Caucasian female patient with coronary disease and balloon angioplasty to mid LAD and D2 branch of LAD in 2016, has about 80% mid to distal LAD residual stenosis left alone due to small vessel, mild PAD, chronic back pain, primary hypertension and hyperlipidemia, has had pulmonary embolism in 2021 and was on Eliquis for a year, discontinued on 03/18/2021, previous PE was felt to be due to his severe pneumonia.  Patient also has history of GI bleed in the past. ? ?Patient has been complaining of left-sided chest pain with radiation to the back, there is concern for unstable angina pectoris.  Upon questioning, patient states that for the past 3 days she has been having left upper chest pain worse on taking deep breath and radiates to the back.  Intensity was very severe upon presentation, today she rates it as 3 out of 10 in intensity.  It is continuous, she tried using sublingual nitroglycerin without relief.  Does state that she received morphine sulfate last night with relief of chest pain and was able to sleep. ? ?She has mild chronic dyspnea, denies PND or orthopnea, no hemoptysis.  No recent long travel, no leg edema. ? ?Past Medical History:  ?Diagnosis Date  ? Anxiety   ? Blood transfusion   ? CAP (community acquired pneumonia)   ? COPD (chronic obstructive pulmonary disease) ([place])   ? Heart murmur   ? History of colon polyps   ? Hyperlipidemia   ? MI (myocardial infarction) ([place])   ? Mitral valve prolapse   ? NSTEMI (non-ST elevated myocardial infarction) ([place])   ? Osteoarthritis   ?  Osteoporosis   ? Pulmonary emboli ([place]) 04/12/2019  ? Spinal stenosis   ? Thyroid disease   ? hypothyroidism  ? Unstable angina ([place]) 05/08/2014  ? Venous insufficiency   ? ?Past Surgical History:  ?Procedure Laterality Date  ? ABDOMINAL HYSTERECTOMY  11/26/1976  ? APPENDECTOMY  02/14/1978  ? BASAL CELL CARCINOMA EXCISION  01/26/1990  ? COLONOSCOPY  1993, 2003, 2008, 2012  ? DILATION AND CURETTAGE OF UTERUS  09/1976  ? LEFT HEART CATHETERIZATION WITH CORONARY ANGIOGRAM N/A 04/18/2014  ? Procedure: LEFT HEART CATHETERIZATION WITH CORONARY ANGIOGRAM;  Surgeon: Peter M Martinique, MD;  Location: [facility] CATH LAB;  Service: Cardiovascular;  Laterality: N/A;  ? MASS EXCISION  10/20/2011  ? Procedure: MINOR EXCISION OF MASS;  Surgeon: Adin Hector, MD;  Location: [facility];  Service: General;  Laterality: N/A;  Excision mass posterior neck  ? OOPHORECTOMY  02/14/1978  ? SHOULDER SURGERY  10/18/2007  ? basal cell  ? SQUAMOUS CELL CARCINOMA EXCISION    ? TONSILLECTOMY  1955  ? TUBAL LIGATION  07/08/74  ? ?Social History  ? ?Tobacco Use  ? Smoking status: Former  ?  Types: Cigarettes  ?  Quit date: 03/10/2005  ?  Years since quitting: 16.2  ? Smokeless tobacco: Never  ?Substance Use Topics  ? Alcohol use: No  ?  ?Family History  ?Problem Relation Age of Onset  ? Heart disease  Mother   ? Heart disease Father   ? Cancer Sister   ?     breast  ? Cancer Daughter   ?     cervical and bone  ? Heart disease Sister   ? Thyroid disease Sister   ?  ?Marital Status: Widowed  ?ROS  ?Review of Systems  ?Cardiovascular:  Positive for chest pain and dyspnea on exertion. Negative for leg swelling.  ?Musculoskeletal:  Positive for arthritis and back pain.  ?Objective  ? ?Vitals with BMI 05/23/2021 05/23/2021 05/23/2021  ?Height - - -  ?Weight - - -  ?BMI - - -  ?Systolic - - -  ?Diastolic - - -  ?Pulse 50 57 58  ?  ?Blood pressure (!) 149/73, pulse (!) 50, temperature 98.2 ?F (36.8 ?C), temperature source Oral, resp. rate 18, height '5\' 4"'$  (1.626  m), weight 92 kg, SpO2 100 %.  ? ?Physical Exam ?Neck:  ?   Vascular: No JVD.  ?Cardiovascular:  ?   Rate and Rhythm: Normal rate and regular rhythm.  ?   Pulses: Intact distal pulses.  ?   Heart sounds: Normal heart sounds. No murmur heard. ?  No gallop.  ?Pulmonary:  ?   Effort: Pulmonary effort is normal.  ?   Breath sounds: Normal breath sounds.  ?Abdominal:  ?   General: Bowel sounds are normal.  ?   Palpations: Abdomen is soft.  ?Musculoskeletal:  ?   Right lower leg: No edema.  ?   Left lower leg: No edema.  ? ?Laboratory examination:  ? ?Recent Labs  ?  03/18/21 ?1132 05/21/21 ?2131 05/21/21 ?2151 05/22/21 ?0443  ?NA 141 136 137 141  ?K 5.1 4.6 4.5 4.4  ?CL 105 101 103 107  ?CO2 22 24  --  25  ?GLUCOSE 97 158* 150* 138*  ?BUN '15 19 19 14  '$ ?CREATININE 0.76 0.81 0.70 0.78  ?CALCIUM 8.8 9.4  --  8.4*  ?GFRNONAA  --  >60  --  >60  ? ?estimated creatinine clearance is 60.6 mL/min (by C-G formula based on SCr of 0.78 mg/dL).  ?CMP Latest Ref Rng & Units 05/22/2021 05/21/2021 05/21/2021  ?Glucose 70 - 99 mg/dL 138(H) 150(H) 158(H)  ?BUN 8 - 23 mg/dL '14 19 19  '$ ?Creatinine 0.44 - 1.00 mg/dL 0.78 0.70 0.81  ?Sodium 135 - 145 mmol/L 141 137 136  ?Potassium 3.5 - 5.1 mmol/L 4.4 4.5 4.6  ?Chloride 98 - 111 mmol/L 107 103 101  ?CO2 22 - 32 mmol/L 25 - 24  ?Calcium 8.9 - 10.3 mg/dL 8.4(L) - 9.4  ?Total Protein 6.5 - 8.1 g/dL - - 6.5  ?Total Bilirubin 0.3 - 1.2 mg/dL - - 0.5  ?Alkaline Phos 38 - 126 U/L - - 81  ?AST 15 - 41 U/L - - 16  ?ALT 0 - 44 U/L - - 13  ? ?CBC Latest Ref Rng & Units 05/22/2021 05/21/2021 05/21/2021  ?WBC 4.0 - 10.5 K/uL 10.7(H) - 10.7(H)  ?Hemoglobin 12.0 - 15.0 g/dL 11.3(L) 13.3 12.6  ?Hematocrit 36.0 - 46.0 % 34.5(L) 39.0 37.2  ?Platelets 150 - 400 K/uL 245 - 277  ? ?Lipid Panel ?No results for input(s): CHOL, TRIG, LDLCALC, VLDL, HDL, CHOLHDL, LDLDIRECT in the last 8760 hours.  ?HEMOGLOBIN A1C ?Lab Results  ?Component Value Date  ? HGBA1C 6.0 (H) 04/20/2014  ? MPG 126 04/20/2014  ? ?TSH ?Recent Labs  ?   03/18/21 ?1132  ?TSH 0.834  ? ?BNP (last 3 results) ?Recent  Labs  ?  03/18/21 ?1132 05/21/21 ?2131  ?BNP 115.8* 73.8  ? ?Cardiac Panel (last 3 results) ?Recent Labs  ?  05/21/21 ?2131 05/21/21 ?2350  ?TROPONINIHS 6 7  ?  ? ?Medications and allergies  ? ?Allergies  ?Allergen Reactions  ? Other Anaphylaxis and Cough  ?  fragarances-perfumes  ? Dilaudid [Hydromorphone Hcl] Nausea Only  ? Erythromycin Other (See Comments)  ?  ?Scheduled Meds: ?Continuous Infusions: ?PRN Meds:.  ? ?I/O last 3 completed shifts: ?In: 150 [P.O.:150] ?Out: -  ?No intake/output data recorded. ?  ?Radiology:  ? ?CT angiogram chest for PE 05/21/2021: ?IMPRESSION: 1. Positive examination for acute pulmonary embolus involving the distal left main pulmonary artery and extending into the left lingular branches. No evidence of right heart strain. 2. Patchy airspace disease in the lungs, probably edema although pneumonia could also have this appearance. Small left pleural effusion with basilar atelectasis.  ? ?DG Chest Portable 1 View ? ?Result Date: 05/21/2021 ?CLINICAL DATA:  Central chest pain, short of breath, coronary artery disease EXAM: PORTABLE CHEST 1 VIEW COMPARISON:  05/01/2020 FINDINGS: Single frontal view of the chest demonstrates a stable cardiac silhouette. Stable atherosclerosis of the aortic arch. No acute airspace disease, effusion, or pneumothorax. No acute bony abnormality. ? ?Lower Extremity Venous Duplex  05/22/2021: ?RIGHT: - There is no evidence of deep vein thrombosis in the lower extremity.  - No cystic structure found in the popliteal fossa.  LEFT: - Findings consistent with age indeterminate deep vein thrombosis involving the left posterior tibial veins. - No cystic structure found in the popliteal fossa. ? ?Cardiac Studies:  ? ?Coronary angiogram 04/18/2014:  ?PTCA and suboptimal balloon angioplasty of mid LAD and D2.  Residual 80% stenosis in the distal LAD not amenable to intervention. LAD collateralized by RCA. Normal  LVEF. ? ?Exercise myoview stress 08/11/2014: ?1. The resting electrocardiogram demonstrated normal sinus rhythm, normal resting conduction, no resting arrhythmias and normal rest repolarization.  The stress electr

## 2021-05-23 NOTE — Progress Notes (Signed)
Explained discharge summary to patient. Also reviewed next medication administration times and follow up appointments. Give eliquis starter administration education. Patient verbalized understanding discharge explanation. All belongings in daughter's possession including TOC meds.  Transported down for discharge.  ?

## 2021-05-23 NOTE — Progress Notes (Signed)
ANTICOAGULATION CONSULT NOTE ? ?Pharmacy Consult for heparin>>lovenox >> apixaban ?Indication: pulmonary embolus ? ?Allergies  ?Allergen Reactions  ? Other Anaphylaxis and Cough  ?  fragarances-perfumes  ? Dilaudid [Hydromorphone Hcl] Nausea Only  ? Erythromycin Other (See Comments)  ? ? ?Patient Measurements: ?Height: '5\' 4"'$  (162.6 cm) ?Weight: 92 kg (202 lb 13.2 oz) ?IBW/kg (Calculated) : 54.7 ?Heparin Dosing Weight: 76kg ? ?Vital Signs: ?Temp: 98.2 ?F (36.8 ?C) (03/16 8786) ?Temp Source: Oral (03/16 7672) ?BP: 149/73 (03/16 0947) ?Pulse Rate: 50 (03/16 1000) ? ?Labs: ?Recent Labs  ?  05/21/21 ?2131 05/21/21 ?2151 05/21/21 ?2350 05/22/21 ?0443 05/22/21 ?0814  ?HGB 12.6 13.3  --  11.3*  --   ?HCT 37.2 39.0  --  34.5*  --   ?PLT 277  --   --  245  --   ?LABPROT 13.0  --   --   --   --   ?INR 1.0  --   --   --   --   ?HEPARINUNFRC  --   --   --   --  >1.10*  ?CREATININE 0.81 0.70  --  0.78  --   ?TROPONINIHS 6  --  7  --   --   ? ? ? ?Estimated Creatinine Clearance: 60.6 mL/min (by C-G formula based on SCr of 0.78 mg/dL). ? ? ?Assessment: ?89 YOF presenting with CP and SOB, CT positive for PE, no RHS.  She has hx of same and hx on Eliquis in 2022 which she is no longer on.  ? ?Was on heparin infusion - now switched to enoxaparin (LD 3/15'@2214'$ ). Okay to change to apixaban for VTE treatment. Hgb 11.3, plt 245 on last check 3/15. No s/sx of bleeding.  ? ?Goal of Therapy:  ?Monitor platelets by anticoagulation protocol: Yes ?  ?Plan:  ?Stop enoxaparin ?Start apixaban 10 mg BID for 7 days then 5 mg BID ?F/u CBC, S&S of bleeding ? ?Antonietta Jewel, PharmD, BCCCP ?Clinical Pharmacist  ?Phone: [phone] ?05/23/2021 10:45 AM ? ?Please check AMION for all [place] phone numbers ?After 10:00 PM, call [place] [phone] ? ? ? ? ? ?

## 2021-05-27 DIAGNOSIS — I10 Essential (primary) hypertension: Secondary | ICD-10-CM | POA: Diagnosis not present

## 2021-05-27 DIAGNOSIS — J449 Chronic obstructive pulmonary disease, unspecified: Secondary | ICD-10-CM | POA: Diagnosis not present

## 2021-05-27 DIAGNOSIS — I130 Hypertensive heart and chronic kidney disease with heart failure and stage 1 through stage 4 chronic kidney disease, or unspecified chronic kidney disease: Secondary | ICD-10-CM | POA: Diagnosis not present

## 2021-05-27 DIAGNOSIS — I5031 Acute diastolic (congestive) heart failure: Secondary | ICD-10-CM | POA: Diagnosis not present

## 2021-05-27 DIAGNOSIS — I2609 Other pulmonary embolism with acute cor pulmonale: Secondary | ICD-10-CM | POA: Diagnosis not present

## 2021-05-27 DIAGNOSIS — I25110 Atherosclerotic heart disease of native coronary artery with unstable angina pectoris: Secondary | ICD-10-CM | POA: Diagnosis not present

## 2021-05-27 DIAGNOSIS — E785 Hyperlipidemia, unspecified: Secondary | ICD-10-CM | POA: Diagnosis not present

## 2021-05-28 ENCOUNTER — Telehealth (HOSPITAL_BASED_OUTPATIENT_CLINIC_OR_DEPARTMENT_OTHER): Payer: Self-pay

## 2021-05-28 ENCOUNTER — Other Ambulatory Visit (HOSPITAL_BASED_OUTPATIENT_CLINIC_OR_DEPARTMENT_OTHER): Payer: Self-pay

## 2021-05-28 ENCOUNTER — Other Ambulatory Visit: Payer: Self-pay

## 2021-05-28 MED ORDER — NITROGLYCERIN 0.4 MG SL SUBL
0.4000 mg | SUBLINGUAL_TABLET | SUBLINGUAL | 0 refills | Status: DC | PRN
Start: 2021-05-28 — End: 2023-07-21

## 2021-05-28 NOTE — Telephone Encounter (Signed)
Pharmacy Transitions of Care Follow-up Telephone Call ? ?Date of discharge: 05/23/21  ?Discharge Diagnosis: PE ? ?How have you been since you were released from the hospital? Patient has been doing pretty good, but is still weak. Was up and walking her dogs outside. Had a follow up appointment with her MD yesterday.  ? ?Medication changes made at discharge: ?START taking these medications ? ?START taking these medications  ?* Eliquis DVT/PE Starter Pack ?Generic drug: Apixaban Starter Pack ('10mg'$  and '5mg'$ ) ?Take as directed on package: start with two-'5mg'$  tablets twice daily for 7 days. On day 8, switch to one-'5mg'$  tablet twice daily.  ?* apixaban 5 MG Tabs tablet ?Commonly known as: ELIQUIS ?Take 1 tablet (5 mg total) by mouth 2 (two) times daily. ?Start taking on: June 22, 2021 ?Notes to patient: Start after completion of Eliquis Starter pack  ?oxyCODONE 5 MG immediate release tablet ?Commonly known as: Roxicodone ?Take 1 tablet (5 mg total) by mouth every 6 (six) hours as needed for up to 5 days for breakthrough pain or severe pain.  ? very important  * This list has 2 medication(s) that are the same as other medications prescribed for you. Read the directions carefully, and ask your doctor or other care provider to review them with you.  ? ?CHANGE how you take these medications ? ?CHANGE how you take these medications  ?* nitroGLYCERIN 0.4 MG SL tablet ?Commonly known as: NITROSTAT ?What changed: Another medication with the same name was changed. Make sure you understand how and when to take each.  ?* nitroGLYCERIN 0.4 mg/hr patch ?Commonly known as: NITRODUR - Dosed in mg/24 hr ?APPLY ONE PATCH ONCE DAILY AS DIRECTED ?What changed: See the new instructions.  ?pantoprazole 40 MG tablet ?Commonly known as: PROTONIX ?Take 1 tablet (40 mg total) by mouth daily at 12 noon. ?What changed: when to take this  ?ranolazine 500 MG 12 hr tablet ?Commonly known as: RANEXA ?TAKE ONE TABLET BY MOUTH EVERY TWELVE HOURS ?What  changed:  ?how to take this ?when to take this  ? very important  * This list has 2 medication(s) that are the same as other medications prescribed for you. Read the directions carefully, and ask your doctor or other care provider to review them with you.  ? ?CONTINUE taking these medications ? ?CONTINUE taking these medications  ?* glucose blood test strip  ?* Accu-Chek SmartView test strip ?Generic drug: glucose blood  ?acetaminophen 500 MG tablet ?Commonly known as: TYLENOL  ?Align 4 MG Caps  ?ALPRAZolam 0.25 MG tablet ?Commonly known as: XANAX  ?budesonide-formoterol 80-4.5 MCG/ACT inhaler ?Commonly known as: SYMBICORT ?Inhale 2 puffs into the lungs 2 (two) times daily.  ?cholecalciferol 25 MCG (1000 UNIT) tablet ?Commonly known as: VITAMIN D  ?COLLAGEN 1500/C PO  ?escitalopram 5 MG tablet ?Commonly known as: LEXAPRO  ?iron polysaccharides 150 MG capsule ?Commonly known as: NIFEREX  ?Levothyroxine Sodium 137 MCG Caps  ?methocarbamol 500 MG tablet ?Commonly known as: ROBAXIN  ?metoprolol tartrate 25 MG tablet ?Commonly known as: LOPRESSOR  ?multivitamin-iron-minerals-folic acid chewable tablet  ?niCARdipine 20 MG capsule ?Commonly known as: CARDENE  ?O-2 Suspensory Misc  ?OXYGEN  ?Systane 0.4-0.3 % Soln ?Generic drug: Polyethyl Glycol-Propyl Glycol  ?Vytorin 10-20 MG tablet ?Generic drug: ezetimibe-simvastatin  ? very important  * This list has 2 medication(s) that are the same as other medications prescribed for you. Read the directions carefully, and ask your doctor or other care provider to review them with you.  ? ?STOP taking these  medications ? ?STOP taking these medications  ?aspirin 81 MG chewable tablet ?Commonly known as: Aspirin Childrens  ?predniSONE 10 MG tablet ?Commonly known as: DELTASONE  ? ? ?Medication changes verified by the patient? yes (Yes/No) ?  ? ?Medication Accessibility: ? ?Home Pharmacy: [facility]  ? ?Was the patient provided with refills on discharged medications? yes   ? ?Have all prescriptions been transferred from [facility] to home pharmacy? N/a starter pack refills  ? ?Is the patient able to afford medications? [employer]-yes ?  ? ?Medication Review: ?APIXABAN (ELIQUIS)  ?Apixaban 10 mg BID initiated on 05/23/21. Will switch to apixaban 5 mg BID after 7 days (DATE 05/30/21).  ?- Discussed importance of taking medication around the same time everyday  ?- Reviewed potential DDIs with patient  ?- Advised patient of medications to avoid (NSAIDs, ASA)  ?- Educated that Tylenol (acetaminophen) will be the preferred analgesic to prevent risk of bleeding  ?- Emphasized importance of monitoring for signs and symptoms of bleeding (abnormal bruising, prolonged bleeding, nose bleeds, bleeding from gums, discolored urine, black tarry stools)  ?- Advised patient to alert all providers of anticoagulation therapy prior to starting a new medication or having a procedure  ? ?Follow-up Appointments: ? ?Date Visit Type Length Department   ? 06/24/2021  9:00 AM PFT 60 min [facility] [[id number]]  ?Patient Instructions:   ?  ?      ? 06/24/2021 10:00 AM OFFICE VISIT 15 min [facility] [[id number]  ? ? ?If their condition worsens, is the pt aware to call PCP or go to the Emergency Dept.? yes ? ?Final Patient Assessment: ?Patient has been doing pretty good, but is still weak. Was up and walking her dogs outside. Had a follow up appointment with her MD yesterday.  ?

## 2021-06-12 ENCOUNTER — Telehealth: Payer: Self-pay

## 2021-06-12 DIAGNOSIS — I25110 Atherosclerotic heart disease of native coronary artery with unstable angina pectoris: Secondary | ICD-10-CM | POA: Diagnosis not present

## 2021-06-12 DIAGNOSIS — I2609 Other pulmonary embolism with acute cor pulmonale: Secondary | ICD-10-CM | POA: Diagnosis not present

## 2021-06-12 DIAGNOSIS — I130 Hypertensive heart and chronic kidney disease with heart failure and stage 1 through stage 4 chronic kidney disease, or unspecified chronic kidney disease: Secondary | ICD-10-CM | POA: Diagnosis not present

## 2021-06-12 DIAGNOSIS — R42 Dizziness and giddiness: Secondary | ICD-10-CM | POA: Diagnosis not present

## 2021-06-12 DIAGNOSIS — J449 Chronic obstructive pulmonary disease, unspecified: Secondary | ICD-10-CM | POA: Diagnosis not present

## 2021-06-12 DIAGNOSIS — I5031 Acute diastolic (congestive) heart failure: Secondary | ICD-10-CM | POA: Diagnosis not present

## 2021-06-12 DIAGNOSIS — D509 Iron deficiency anemia, unspecified: Secondary | ICD-10-CM | POA: Diagnosis not present

## 2021-06-12 DIAGNOSIS — D638 Anemia in other chronic diseases classified elsewhere: Secondary | ICD-10-CM | POA: Diagnosis not present

## 2021-06-12 NOTE — Telephone Encounter (Signed)
[facility] called and stated that they need a copy of the office note in order to approve the pt for oxygen. I do not see this mentioned in this last office note. Please advise. ? ?[place] fax number: [phone] ?

## 2021-06-13 NOTE — Telephone Encounter (Signed)
Called and spoke to [place]. They will call the pts pulmonologist.

## 2021-06-13 NOTE — Telephone Encounter (Signed)
I don't see anything about it in notes as far back as 1 year ago. She is now seeing pulm can we defer oxygen management to them? Or touch base with Rise Paganini and let me know what I need to write to get it taken care of.

## 2021-06-24 ENCOUNTER — Ambulatory Visit (INDEPENDENT_AMBULATORY_CARE_PROVIDER_SITE_OTHER): Payer: Medicare PPO | Admitting: Pulmonary Disease

## 2021-06-24 ENCOUNTER — Encounter: Payer: Self-pay | Admitting: Pulmonary Disease

## 2021-06-24 ENCOUNTER — Ambulatory Visit: Payer: Medicare PPO | Admitting: Pulmonary Disease

## 2021-06-24 VITALS — BP 118/74 | HR 52 | Ht 64.0 in | Wt 183.0 lb

## 2021-06-24 DIAGNOSIS — I2609 Other pulmonary embolism with acute cor pulmonale: Secondary | ICD-10-CM

## 2021-06-24 DIAGNOSIS — J4530 Mild persistent asthma, uncomplicated: Secondary | ICD-10-CM

## 2021-06-24 DIAGNOSIS — R0602 Shortness of breath: Secondary | ICD-10-CM | POA: Diagnosis not present

## 2021-06-24 DIAGNOSIS — J9601 Acute respiratory failure with hypoxia: Secondary | ICD-10-CM

## 2021-06-24 LAB — PULMONARY FUNCTION TEST
DL/VA % pred: 105 %
DL/VA: 4.31 ml/min/mmHg/L
DLCO cor % pred: 79 %
DLCO cor: 14.81 ml/min/mmHg
DLCO unc % pred: 73 %
DLCO unc: 13.75 ml/min/mmHg
FEF 25-75 Post: 2.84 L/sec
FEF 25-75 Pre: 2.07 L/sec
FEF2575-%Change-Post: 37 %
FEF2575-%Pred-Post: 209 %
FEF2575-%Pred-Pre: 152 %
FEV1-%Change-Post: 11 %
FEV1-%Pred-Post: 95 %
FEV1-%Pred-Pre: 85 %
FEV1-Post: 1.82 L
FEV1-Pre: 1.64 L
FEV1FVC-%Change-Post: 1 %
FEV1FVC-%Pred-Pre: 112 %
FEV6-%Change-Post: 10 %
FEV6-%Pred-Post: 89 %
FEV6-%Pred-Pre: 81 %
FEV6-Post: 2.17 L
FEV6-Pre: 1.96 L
FEV6FVC-%Pred-Post: 105 %
FEV6FVC-%Pred-Pre: 105 %
FVC-%Change-Post: 9 %
FVC-%Pred-Post: 84 %
FVC-%Pred-Pre: 77 %
FVC-Post: 2.17 L
FVC-Pre: 1.98 L
Post FEV1/FVC ratio: 84 %
Post FEV6/FVC ratio: 100 %
Pre FEV1/FVC ratio: 83 %
Pre FEV6/FVC Ratio: 100 %

## 2021-06-24 MED ORDER — LEVALBUTEROL TARTRATE 45 MCG/ACT IN AERO: 2.0000 | INHALATION_SPRAY | RESPIRATORY_TRACT | 2 refills | Status: DC | PRN

## 2021-06-24 MED ORDER — BUDESONIDE-FORMOTEROL FUMARATE 160-4.5 MCG/ACT IN AERO: 2.0000 | INHALATION_SPRAY | Freq: Two times a day (BID) | RESPIRATORY_TRACT | 12 refills | Status: DC

## 2021-06-24 NOTE — Progress Notes (Signed)
Full PFT performed today. °

## 2021-06-24 NOTE — Progress Notes (Signed)
? ?Synopsis: Referred in February 2023 for shortness of breath by Desiree Cruel, PA ? ?Subjective:  ? ?PATIENT ID: Desiree Leon GENDER: female DOB: 11/05/1940, MRN: [id number] ? ?HPI ? ?Chief Complaint  ?Patient presents with  ? Follow-up  ?  F/U after PFT.   ? ?Desiree Leon is an 81 year old woman, former smoker with history of coronary artery disease, pulmonary emboli, hypothyroidism and mitral valve prolapse who returns to pulmonary clinic for shortness of breath.  ? ?Patient was recently admitted 3/15 to 3/16 for acute pulmonary emboli of the left main pulmonary artery as she presented with left sided chest pain. She was started on eliquis therapy. She was discharged with supplemental oxygen during the day. She was previously using O2 at night. She is feeling much better since hospitalization. She reports intermittent cough and wheezing. She is using symbicort 80-4.33mg 2 puffs twice daily. She does not like to use albuterol as this makes her feel jittery.  ? ?She reports the spring allergies are bothering her. She is not taking allergy medicine.  ? ?She had a PE 3 years ago and stopped anticoagulation 1 year ago. She denies weight loss or lack of appetite. She reports a 3lbs weight gain.  ? ?PFTs show normal spirometry and lung volumes with mild decrease in DLCO, but normalizes when corrected for hemoglobin level.  ? ?OV 04/25/21 ?Cardiology note 04/15/21 reviewed. Echo 03/21/21 showed LV EF 60-65% and improvement in mildly elevated PASP pressures from an echo in 2022.  ? ?PFTs from 2015 were within normal limits. She has symbicort 80-4.552m inhaler and is using it as needed. She does notice some benefit in her wheezing and cough when used.  ? ?She had covid 04/2020. She was admitted in 04/2019 for pneumonia and pulmonary emboli. She has completed eliquis therapy for the PE.  ? ?She has shortness of breath when bathing herself and dressing herself. The shortness of breath has been on going since her myocardial  infarction in 2016. She is using 2.5L of O2 at night since 2017. She is not very active due to back pain. She has seasonal allergies. ? ?She lives alone with her jack russel [facility] daughter lives next door. She quit smoking in 2007. She smoked half a pack for 30 years.  ? ? ?Past Medical History:  ?Diagnosis Date  ? Anxiety   ? Blood transfusion   ? CAP (community acquired pneumonia)   ? COPD (chronic obstructive pulmonary disease) ([facility]  ? Heart murmur   ? History of colon polyps   ? Hyperlipidemia   ? MI (myocardial infarction) ([facility]  ? Mitral valve prolapse   ? NSTEMI (non-ST elevated myocardial infarction) ([facility]  ? Osteoarthritis   ? Osteoporosis   ? Pulmonary emboli ([facility]2/04/2019  ? Spinal stenosis   ? Thyroid disease   ? hypothyroidism  ? Unstable angina ([facility] [place]/29/2016  ? Venous insufficiency   ?  ? ?Family History  ?Problem Relation Age of Onset  ? Heart disease Mother   ? Heart disease Father   ? Cancer Sister   ?     breast  ? Cancer Daughter   ?     cervical and bone  ? Heart disease Sister   ? Thyroid disease Sister   ?  ? ?Social History  ? ?Socioeconomic History  ? Marital status: Widowed  ?  Spouse name: Not on file  ? Number of children: 3  ? Years of  education: Not on file  ? Highest education level: Not on file  ?Occupational History  ? Not on file  ?Tobacco Use  ? Smoking status: Former  ?  Types: Cigarettes  ?  Quit date: 03/10/2005  ?  Years since quitting: 16.3  ? Smokeless tobacco: Never  ?Vaping Use  ? Vaping Use: Never used  ?Substance and Sexual Activity  ? Alcohol use: No  ? Drug use: No  ? Sexual activity: Not on file  ?Other Topics Concern  ? Not on file  ?Social History Narrative  ? Not on file  ? ?Social Determinants of Health  ? ?Financial Resource Strain: Not on file  ?Food Insecurity: Not on file  ?Transportation Needs: Not on file  ?Physical Activity: Not on file  ?Stress: Not on file  ?Social Connections: Not on file  ?Intimate Partner Violence: Not on file  ?   ? ?Allergies  ?Allergen Reactions  ? Other Anaphylaxis and Cough  ?  fragarances-perfumes  ? Dilaudid [Hydromorphone Hcl] Nausea Only  ? Erythromycin Other (See Comments)  ?  ? ?Outpatient Medications Prior to Visit  ?Medication Sig Dispense Refill  ? ACCU-CHEK SMARTVIEW test strip     ? acetaminophen (TYLENOL) 500 MG tablet Take 500 mg by mouth every 6 (six) hours as needed for fever or headache.     ? ALPRAZolam (XANAX) 0.25 MG tablet Take 0.125 mg by mouth at bedtime as needed for anxiety.     ? apixaban (ELIQUIS) 5 MG TABS tablet Take 1 tablet (5 mg total) by mouth 2 (two) times daily. 60 tablet 1  ? APIXABAN (ELIQUIS) VTE STARTER PACK ('10MG'$  AND '5MG'$ ) Take as directed on package: start with two-'5mg'$  tablets twice daily for 7 days. On day 8, switch to one-'5mg'$  tablet twice daily. 74 each 2  ? cholecalciferol (VITAMIN D) 25 MCG (1000 UNIT) tablet 2 capsules.    ? Collagen-Vitamin C-Biotin (COLLAGEN 1500/C PO) Take 1 capsule by mouth daily.    ? Elastic Bandages & Supports (O-2 SUSPENSORY) MISC 8 hrs every night    ? escitalopram (LEXAPRO) 5 MG tablet Take 5 mg by mouth every evening.     ? glucose blood test strip Accu-Chek SmartView Test Strips ? CHECK BLOOD SUGAR TWICE DAILY.    ? iron polysaccharides (NIFEREX) 150 MG capsule Take 150 mg by mouth daily.    ? Levothyroxine Sodium 137 MCG CAPS Take 137 mcg by mouth daily before breakfast.     ? methocarbamol (ROBAXIN) 500 MG tablet Take 500 mg by mouth every 8 (eight) hours as needed for muscle spasms.    ? metoprolol tartrate (LOPRESSOR) 25 MG tablet Take 25 mg by mouth 2 (two) times daily.     ? multivitamin-iron-minerals-folic acid (CENTRUM) chewable tablet Chew 1 tablet by mouth daily.    ? niCARdipine (CARDENE) 20 MG capsule Take 20 mg by mouth every evening.     ? nitroGLYCERIN (NITRODUR - DOSED IN MG/24 HR) 0.4 mg/hr patch APPLY ONE PATCH ONCE DAILY AS DIRECTED (Patient taking differently: 0.4 mg daily.) 30 patch 3  ? nitroGLYCERIN (NITROSTAT) 0.4 MG SL  tablet Place 1 tablet (0.4 mg total) under the tongue every 5 (five) minutes as needed for chest pain. 30 tablet 0  ? OXYGEN Inhale 2 L into the lungs at bedtime.    ? pantoprazole (PROTONIX) 40 MG tablet Take 1 tablet (40 mg total) by mouth daily at 12 noon. (Patient taking differently: Take 40 mg by mouth 2 (two) times daily.)  30 tablet 1  ? Polyethyl Glycol-Propyl Glycol (SYSTANE) 0.4-0.3 % SOLN Place 1 drop into both eyes 2 (two) times daily as needed (dry eyes).    ? Probiotic Product (ALIGN) 4 MG CAPS Take 4 mg by mouth daily.    ? ranolazine (RANEXA) 500 MG 12 hr tablet TAKE ONE TABLET BY MOUTH EVERY TWELVE HOURS (Patient taking differently: 500 mg 2 (two) times daily.) 180 tablet 1  ? VYTORIN 10-20 MG per tablet Take 1 tablet by mouth daily at 6 PM.     ? budesonide-formoterol (SYMBICORT) 80-4.5 MCG/ACT inhaler Inhale 2 puffs into the lungs 2 (two) times daily. 1 each 6  ? ?No facility-administered medications prior to visit.  ? ?Review of Systems  ?Constitutional:  Negative for chills, fever, malaise/fatigue and weight loss.  ?HENT:  Negative for congestion, sinus pain and sore throat.   ?Eyes: Negative.   ?Respiratory:  Positive for cough, sputum production, shortness of breath and wheezing. Negative for hemoptysis.   ?Cardiovascular:  Positive for chest pain. Negative for palpitations, orthopnea, claudication and leg swelling.  ?Gastrointestinal:  Negative for abdominal pain, heartburn, nausea and vomiting.  ?Genitourinary: Negative.   ?Musculoskeletal:  Negative for joint pain and myalgias.  ?Skin:  Negative for rash.  ?Neurological:  Negative for weakness.  ?Endo/Heme/Allergies: Negative.   ?Psychiatric/Behavioral: Negative.    ? ?Objective:  ? ?Vitals:  ? 06/24/21 1004  ?BP: 118/74  ?Pulse: (!) 52  ?SpO2: 100%  ?Weight: 183 lb (83 kg)  ?Height: '5\' 4"'$  (1.626 m)  ? ? ?Physical Exam ?Constitutional:   ?   General: She is not in acute distress. ?   Appearance: She is not ill-appearing.  ?HENT:  ?   Head:  Normocephalic and atraumatic.  ?Eyes:  ?   General: No scleral icterus. ?   Conjunctiva/sclera: Conjunctivae normal.  ?Cardiovascular:  ?   Rate and Rhythm: Normal rate and regular rhythm.  ?   Pulses: Normal

## 2021-06-24 NOTE — Patient Instructions (Signed)
Full PFT performed today. °

## 2021-06-24 NOTE — Patient Instructions (Addendum)
We will increase the dose of your symbicort inhaler to 160-4.39mg from 80-4.565m. ?- Use 2 puffs twice daily ?- rinse mouth out after each use ? ?Continue xyzal as need for spring allergies ? ?Use xopenex inhaler 1-2 puffs every 4-6 hours as needed ? ?Continue eliquis twice daily for the blood clots.  ? ?Follow up in 6 months ?

## 2021-06-26 DIAGNOSIS — J449 Chronic obstructive pulmonary disease, unspecified: Secondary | ICD-10-CM | POA: Diagnosis not present

## 2021-06-26 DIAGNOSIS — K219 Gastro-esophageal reflux disease without esophagitis: Secondary | ICD-10-CM | POA: Diagnosis not present

## 2021-06-26 DIAGNOSIS — E1129 Type 2 diabetes mellitus with other diabetic kidney complication: Secondary | ICD-10-CM | POA: Diagnosis not present

## 2021-06-26 DIAGNOSIS — I130 Hypertensive heart and chronic kidney disease with heart failure and stage 1 through stage 4 chronic kidney disease, or unspecified chronic kidney disease: Secondary | ICD-10-CM | POA: Diagnosis not present

## 2021-06-26 DIAGNOSIS — Z9981 Dependence on supplemental oxygen: Secondary | ICD-10-CM | POA: Diagnosis not present

## 2021-06-26 DIAGNOSIS — D638 Anemia in other chronic diseases classified elsewhere: Secondary | ICD-10-CM | POA: Diagnosis not present

## 2021-06-26 DIAGNOSIS — D509 Iron deficiency anemia, unspecified: Secondary | ICD-10-CM | POA: Diagnosis not present

## 2021-06-26 DIAGNOSIS — E039 Hypothyroidism, unspecified: Secondary | ICD-10-CM | POA: Diagnosis not present

## 2021-06-26 DIAGNOSIS — E1159 Type 2 diabetes mellitus with other circulatory complications: Secondary | ICD-10-CM | POA: Diagnosis not present

## 2021-07-09 ENCOUNTER — Other Ambulatory Visit: Payer: Self-pay | Admitting: Cardiology

## 2021-07-09 DIAGNOSIS — I209 Angina pectoris, unspecified: Secondary | ICD-10-CM

## 2021-07-18 DIAGNOSIS — Z961 Presence of intraocular lens: Secondary | ICD-10-CM | POA: Diagnosis not present

## 2021-07-18 DIAGNOSIS — H04123 Dry eye syndrome of bilateral lacrimal glands: Secondary | ICD-10-CM | POA: Diagnosis not present

## 2021-07-18 DIAGNOSIS — H30893 Other chorioretinal inflammations, bilateral: Secondary | ICD-10-CM | POA: Diagnosis not present

## 2021-07-18 DIAGNOSIS — E119 Type 2 diabetes mellitus without complications: Secondary | ICD-10-CM | POA: Diagnosis not present

## 2021-07-22 IMAGING — DX DG CHEST 1V PORT
1 series · 1 of 1 positions shown · non-contrast
Comparison: September 12, 2019

CLINICAL DATA: Shortness of breath, COVID positive on 04/23/2020,
improving but now with worsening shortness of breath.

EXAM:
PORTABLE CHEST 1 VIEW

[chest ap]
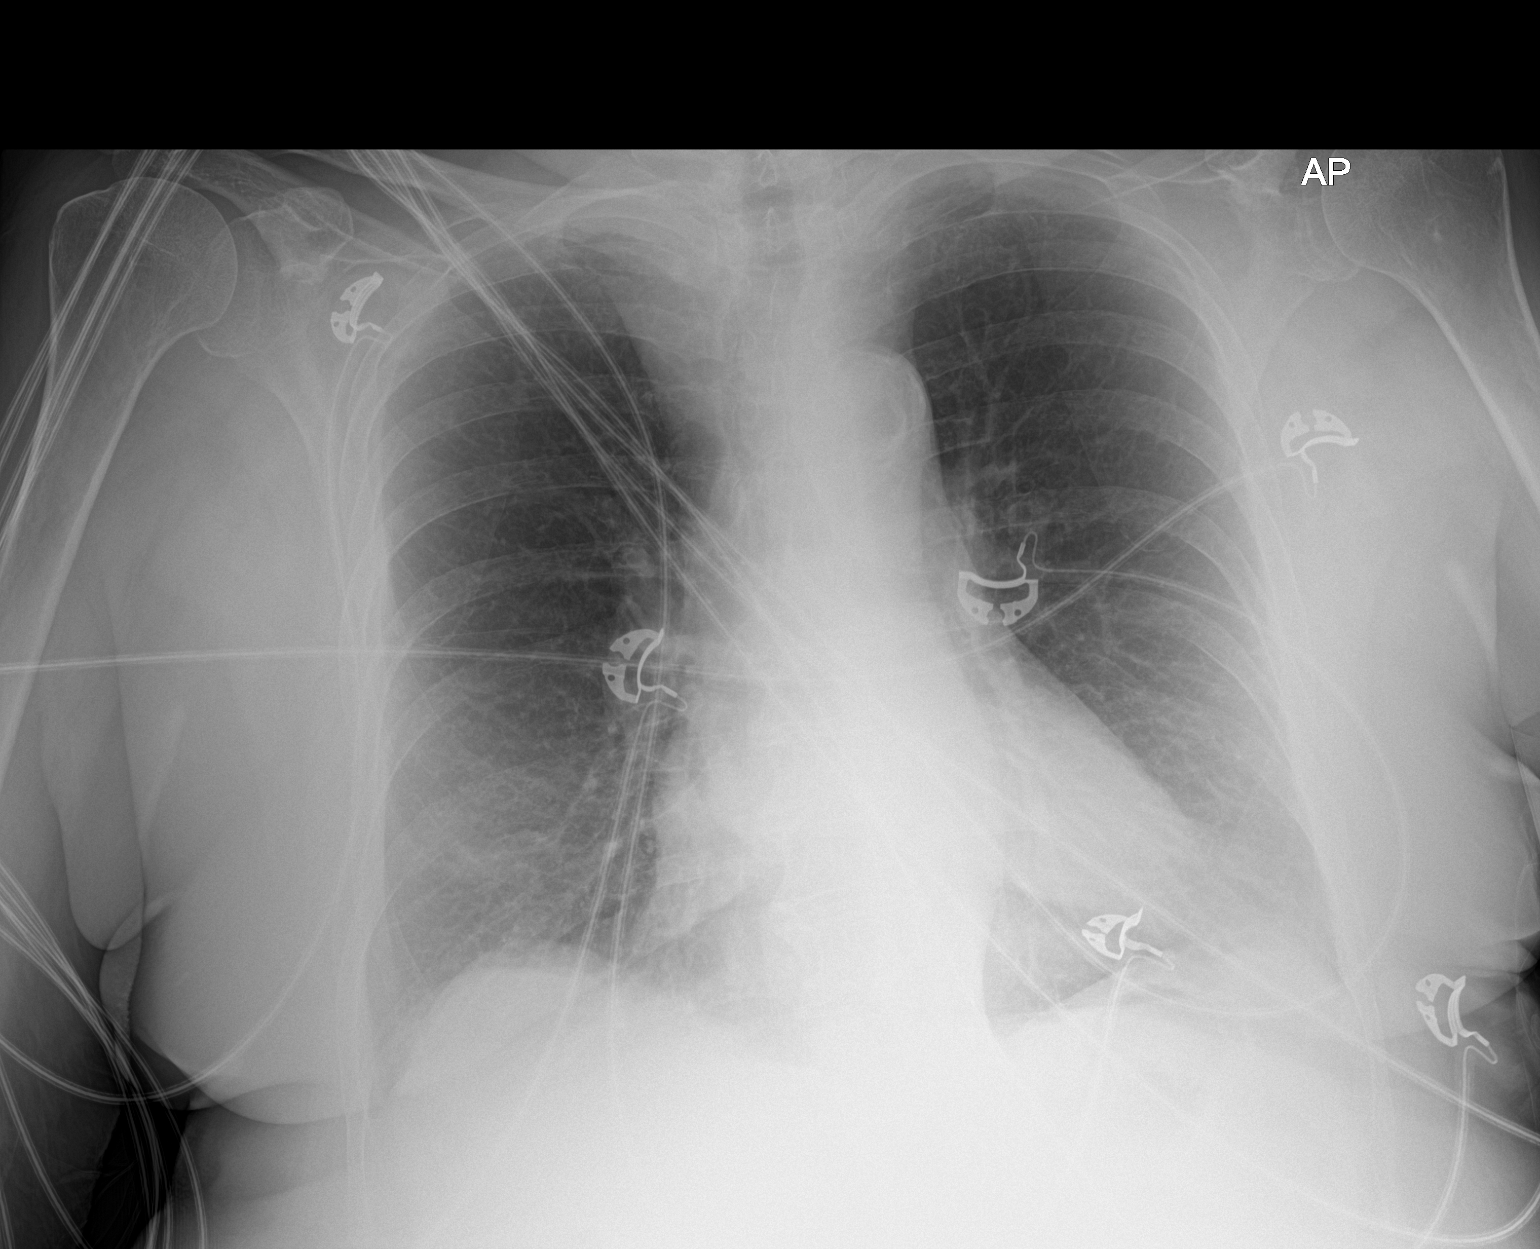

[1 of 1 positions shown; findings below may reference images not displayed]

FINDINGS: EKG leads project over the chest.

Minimal airspace disease at the lung bases.

Cardiomediastinal contours and hilar structures are stable. Mild
cardiac enlargement and aortic atherosclerosis as before.

On limited assessment no acute skeletal process.
IMPRESSION: Mild atelectasis, no consolidation or effusion.

## 2021-07-29 DIAGNOSIS — Z01419 Encounter for gynecological examination (general) (routine) without abnormal findings: Secondary | ICD-10-CM | POA: Diagnosis not present

## 2021-07-29 DIAGNOSIS — Z1231 Encounter for screening mammogram for malignant neoplasm of breast: Secondary | ICD-10-CM | POA: Diagnosis not present

## 2021-07-29 DIAGNOSIS — Z6831 Body mass index (BMI) 31.0-31.9, adult: Secondary | ICD-10-CM | POA: Diagnosis not present

## 2021-08-30 DIAGNOSIS — E1159 Type 2 diabetes mellitus with other circulatory complications: Secondary | ICD-10-CM | POA: Diagnosis not present

## 2021-08-30 DIAGNOSIS — Z7901 Long term (current) use of anticoagulants: Secondary | ICD-10-CM | POA: Diagnosis not present

## 2021-08-30 DIAGNOSIS — I5031 Acute diastolic (congestive) heart failure: Secondary | ICD-10-CM | POA: Diagnosis not present

## 2021-08-30 DIAGNOSIS — D509 Iron deficiency anemia, unspecified: Secondary | ICD-10-CM | POA: Diagnosis not present

## 2021-08-30 DIAGNOSIS — J449 Chronic obstructive pulmonary disease, unspecified: Secondary | ICD-10-CM | POA: Diagnosis not present

## 2021-08-30 DIAGNOSIS — I25110 Atherosclerotic heart disease of native coronary artery with unstable angina pectoris: Secondary | ICD-10-CM | POA: Diagnosis not present

## 2021-08-30 DIAGNOSIS — F5089 Other specified eating disorder: Secondary | ICD-10-CM | POA: Diagnosis not present

## 2021-08-30 DIAGNOSIS — I130 Hypertensive heart and chronic kidney disease with heart failure and stage 1 through stage 4 chronic kidney disease, or unspecified chronic kidney disease: Secondary | ICD-10-CM | POA: Diagnosis not present

## 2021-09-11 ENCOUNTER — Other Ambulatory Visit (HOSPITAL_COMMUNITY): Payer: Self-pay

## 2021-09-12 ENCOUNTER — Ambulatory Visit (HOSPITAL_COMMUNITY)
Admission: RE | Admit: 2021-09-12 | Discharge: 2021-09-12 | Disposition: A | Discharge status: Home / Self Care | Payer: Medicare PPO | Source: Ambulatory Visit | Attending: Internal Medicine | Admitting: Internal Medicine

## 2021-09-12 DIAGNOSIS — D649 Anemia, unspecified: Secondary | ICD-10-CM | POA: Diagnosis not present

## 2021-09-12 MED ORDER — SODIUM CHLORIDE 0.9 % IV SOLN
510.0000 mg | INTRAVENOUS | Status: DC
  Administered 2021-09-12: 510 mg via INTRAVENOUS
  Filled 2021-09-12: qty 510

## 2021-09-17 DIAGNOSIS — L821 Other seborrheic keratosis: Secondary | ICD-10-CM | POA: Diagnosis not present

## 2021-09-17 DIAGNOSIS — L720 Epidermal cyst: Secondary | ICD-10-CM | POA: Diagnosis not present

## 2021-09-17 DIAGNOSIS — D225 Melanocytic nevi of trunk: Secondary | ICD-10-CM | POA: Diagnosis not present

## 2021-09-17 DIAGNOSIS — L918 Other hypertrophic disorders of the skin: Secondary | ICD-10-CM | POA: Diagnosis not present

## 2021-09-17 DIAGNOSIS — L820 Inflamed seborrheic keratosis: Secondary | ICD-10-CM | POA: Diagnosis not present

## 2021-09-17 DIAGNOSIS — Z85828 Personal history of other malignant neoplasm of skin: Secondary | ICD-10-CM | POA: Diagnosis not present

## 2021-09-19 ENCOUNTER — Encounter (HOSPITAL_COMMUNITY)
Admission: RE | Admit: 2021-09-19 | Discharge: 2021-09-19 | Disposition: A | Discharge status: Home / Self Care | Payer: Medicare PPO | Source: Ambulatory Visit | Attending: Internal Medicine | Admitting: Internal Medicine

## 2021-09-19 DIAGNOSIS — D649 Anemia, unspecified: Secondary | ICD-10-CM | POA: Insufficient documentation

## 2021-09-19 MED ORDER — SODIUM CHLORIDE 0.9 % IV SOLN
510.0000 mg | INTRAVENOUS | Status: AC
  Administered 2021-09-19: 510 mg via INTRAVENOUS
  Filled 2021-09-19: qty 510

## 2021-09-25 DIAGNOSIS — R051 Acute cough: Secondary | ICD-10-CM | POA: Diagnosis not present

## 2021-09-25 DIAGNOSIS — J069 Acute upper respiratory infection, unspecified: Secondary | ICD-10-CM | POA: Diagnosis not present

## 2021-09-25 DIAGNOSIS — J449 Chronic obstructive pulmonary disease, unspecified: Secondary | ICD-10-CM | POA: Diagnosis not present

## 2021-09-25 DIAGNOSIS — I130 Hypertensive heart and chronic kidney disease with heart failure and stage 1 through stage 4 chronic kidney disease, or unspecified chronic kidney disease: Secondary | ICD-10-CM | POA: Diagnosis not present

## 2021-09-25 DIAGNOSIS — I5031 Acute diastolic (congestive) heart failure: Secondary | ICD-10-CM | POA: Diagnosis not present

## 2021-10-14 ENCOUNTER — Encounter: Payer: Self-pay | Admitting: Cardiology

## 2021-10-14 ENCOUNTER — Ambulatory Visit: Payer: Medicare PPO | Admitting: Cardiology

## 2021-10-14 ENCOUNTER — Ambulatory Visit: Payer: Medicare PPO | Admitting: Student

## 2021-10-14 VITALS — BP 136/67 | HR 52 | Temp 98.4°F | Resp 16 | Ht 64.0 in | Wt 180.4 lb

## 2021-10-14 DIAGNOSIS — I25118 Atherosclerotic heart disease of native coronary artery with other forms of angina pectoris: Secondary | ICD-10-CM | POA: Diagnosis not present

## 2021-10-14 DIAGNOSIS — Z86711 Personal history of pulmonary embolism: Secondary | ICD-10-CM | POA: Diagnosis not present

## 2021-10-14 DIAGNOSIS — I10 Essential (primary) hypertension: Secondary | ICD-10-CM

## 2021-10-14 DIAGNOSIS — R0609 Other forms of dyspnea: Secondary | ICD-10-CM

## 2021-10-14 NOTE — Progress Notes (Signed)
Primary Physician/Referring:  Burnard Bunting, MD  Patient ID: Burlene Arnt, female    DOB: 1940-07-22, 81 y.o.   MRN: [id number]  Chief Complaint  Patient presents with   Hypertension   Hyperlipidemia   Coronary Artery Disease   Follow-up    6 months   HPI:    AYA GEISEL  is a 81 y.o. Caucasian female  follow-up of coronary artery disease h/o NSTEMI on 04/18/2014 S/P  balloon angioplasty of the LAD and D2 with 80% residual stenosis which was not amenable to further intervention due to the LAD being a small artery. and mild peripheral arterial disease, left leg sciatica and chronic back pain, hypertension and hyperlipidemia presents for annual visit.  I had seen her in March 2023 when she presented with very large bilateral pulmonary emboli leading to RV strain.  Fortunately she has done well, no clinical evidence of heart failure, she is presently on Eliquis for life in view of unprovoked pulmonary embolism.  She is presently on continuous oxygen. She has not had any further episodes of worsening dyspnea, no leg edema, no chest pain.  She is presently doing well otherwise.  Accompanied by her daughter.  Past Medical History:  Diagnosis Date   Anxiety    Blood transfusion    CAP (community acquired pneumonia)    COPD (chronic obstructive pulmonary disease) ([place])    Heart murmur    History of colon polyps    Hyperlipidemia    Hypertension    MI (myocardial infarction) ([place])    Mitral valve prolapse    NSTEMI (non-ST elevated myocardial infarction) ([place])    Osteoarthritis    Osteoporosis    Pulmonary emboli ([place]) 04/12/2019   Spinal stenosis    Thyroid disease    hypothyroidism   Unstable angina ([place]) 05/08/2014   Venous insufficiency    Past Surgical History:  Procedure Laterality Date   ABDOMINAL HYSTERECTOMY  11/26/1976   APPENDECTOMY  02/14/1978   BASAL CELL CARCINOMA EXCISION  01/26/1990   COLONOSCOPY  1993, 2003, 2008, 2012   DILATION AND CURETTAGE OF UTERUS   09/1976   LEFT HEART CATHETERIZATION WITH CORONARY ANGIOGRAM N/A 04/18/2014   Procedure: LEFT HEART CATHETERIZATION WITH CORONARY ANGIOGRAM;  Surgeon: Peter M Martinique, MD;  Location: [facility] CATH LAB;  Service: Cardiovascular;  Laterality: N/A;   MASS EXCISION  10/20/2011   Procedure: MINOR EXCISION OF MASS;  Surgeon: Adin Hector, MD;  Location: [place];  Service: General;  Laterality: N/A;  Excision mass posterior neck   OOPHORECTOMY  02/14/1978   SHOULDER SURGERY  10/18/2007   basal cell   [place]   TUBAL LIGATION  07/08/74   Social History   Tobacco Use   Smoking status: Former    Types: Cigarettes    Quit date: 03/10/2005    Years since quitting: 16.6   Smokeless tobacco: Never  Substance Use Topics   Alcohol use: No  Marital Status: Widowed    ROS  Review of Systems  Cardiovascular:  Positive for dyspnea on exertion (stable). Negative for chest pain and leg swelling.   Objective  Blood pressure 136/67, pulse (!) 52, temperature 98.4 F (36.9 C), temperature source Temporal, resp. rate 16, height '5\' 4"'  (1.626 m), weight 180 lb 6.4 oz (81.8 kg), SpO2 98 %. Body mass index is 30.97 kg/m.     10/14/2021   10:00 AM 09/19/2021    9:15 AM 09/19/2021  8:04 AM  Vitals with BMI  Height '5\' 4"'     Weight 180 lbs 6 oz  184 lbs  BMI 81.01    Systolic 751 025 852  Diastolic 67 46 57  Pulse 52 52 59     Physical Exam Neck:     Vascular: No carotid bruit or JVD.  Cardiovascular:     Rate and Rhythm: Normal rate and regular rhythm.     Pulses: Intact distal pulses.     Heart sounds: Normal heart sounds. No murmur heard.    No gallop.  Pulmonary:     Effort: Pulmonary effort is normal.     Breath sounds: Normal breath sounds.  Abdominal:     General: Bowel sounds are normal.     Palpations: Abdomen is soft.  Musculoskeletal:     Right lower leg: No edema.     Left lower leg: No edema.    Laboratory examination:    Recent Labs    03/18/21 1132 05/21/21 2131 05/21/21 2151 05/22/21 0443  NA 141 136 137 141  K 5.1 4.6 4.5 4.4  CL 105 101 103 107  CO2 22 24  --  25  GLUCOSE 97 158* 150* 138*  BUN '15 19 19 14  ' CREATININE 0.76 0.81 0.70 0.78  CALCIUM 8.8 9.4  --  8.4*  GFRNONAA  --  >60  --  >60   CrCl cannot be calculated (Patient's most recent lab result is older than the maximum 21 days allowed.).     Latest Ref Rng & Units 05/22/2021    4:43 AM 05/21/2021    9:51 PM 05/21/2021    9:31 PM  CMP  Glucose 70 - 99 mg/dL 138  150  158   BUN 8 - 23 mg/dL '14  19  19   ' Creatinine 0.44 - 1.00 mg/dL 0.78  0.70  0.81   Sodium 135 - 145 mmol/L 141  137  136   Potassium 3.5 - 5.1 mmol/L 4.4  4.5  4.6   Chloride 98 - 111 mmol/L 107  103  101   CO2 22 - 32 mmol/L 25   24   Calcium 8.9 - 10.3 mg/dL 8.4   9.4   Total Protein 6.5 - 8.1 g/dL   6.5   Total Bilirubin 0.3 - 1.2 mg/dL   0.5   Alkaline Phos 38 - 126 U/L   81   AST 15 - 41 U/L   16   ALT 0 - 44 U/L   13       Latest Ref Rng & Units 05/22/2021    4:43 AM 05/21/2021    9:51 PM 05/21/2021    9:31 PM  CBC  WBC 4.0 - 10.5 K/uL 10.7   10.7   Hemoglobin 12.0 - 15.0 g/dL 11.3  13.3  12.6   Hematocrit 36.0 - 46.0 % 34.5  39.0  37.2   Platelets 150 - 400 K/uL 245   277    External Labs:  Cholesterol, total 149.000 m 10/08/2020 HDL 36.000 mg 10/08/2020 LDL 89.000 mg 10/08/2020 Triglycerides 119.000 m 10/08/2020  A1C 5.900 06/26/2021   Medications and allergies   Allergies  Allergen Reactions   Other Anaphylaxis and Cough    fragarances-perfumes   Dilaudid [Hydromorphone Hcl] Nausea Only   Erythromycin Other (See Comments)     Current Outpatient Medications:    ACCU-CHEK SMARTVIEW test strip, , Disp: , Rfl:    acetaminophen (TYLENOL) 500 MG tablet, Take 500 mg by  mouth every 6 (six) hours as needed for fever or headache. , Disp: , Rfl:    ALPRAZolam (XANAX) 0.25 MG tablet, Take 0.125 mg by mouth at bedtime as needed for anxiety. , Disp: ,  Rfl:    apixaban (ELIQUIS) 5 MG TABS tablet, Take 1 tablet (5 mg total) by mouth 2 (two) times daily., Disp: 60 tablet, Rfl: 1   budesonide-formoterol (SYMBICORT) 160-4.5 MCG/ACT inhaler, Inhale 2 puffs into the lungs 2 (two) times daily., Disp: 1 each, Rfl: 12   cholecalciferol (VITAMIN D) 25 MCG (1000 UNIT) tablet, 2 capsules., Disp: , Rfl:    Collagen-Vitamin C-Biotin (COLLAGEN 1500/C PO), Take 1 capsule by mouth daily., Disp: , Rfl:    Elastic Bandages & Supports (O-2 SUSPENSORY) MISC, 8 hrs every night, Disp: , Rfl:    escitalopram (LEXAPRO) 5 MG tablet, Take 5 mg by mouth every evening. , Disp: , Rfl:    glucose blood test strip, Accu-Chek SmartView Test Strips  CHECK BLOOD SUGAR TWICE DAILY., Disp: , Rfl:    iron polysaccharides (NIFEREX) 150 MG capsule, Take 150 mg by mouth daily., Disp: , Rfl:    levalbuterol (XOPENEX HFA) 45 MCG/ACT inhaler, Inhale 2 puffs into the lungs every 4 (four) hours as needed for wheezing., Disp: 1 each, Rfl: 2   Levothyroxine Sodium 137 MCG CAPS, Take 137 mcg by mouth daily before breakfast. , Disp: , Rfl:    methocarbamol (ROBAXIN) 500 MG tablet, Take 500 mg by mouth every 8 (eight) hours as needed for muscle spasms., Disp: , Rfl:    metoprolol tartrate (LOPRESSOR) 25 MG tablet, Take 25 mg by mouth 2 (two) times daily. , Disp: , Rfl:    multivitamin-iron-minerals-folic acid (CENTRUM) chewable tablet, Chew 1 tablet by mouth daily., Disp: , Rfl:    niCARdipine (CARDENE) 20 MG capsule, Take 20 mg by mouth every evening. , Disp: , Rfl:    nitroGLYCERIN (NITROSTAT) 0.4 MG SL tablet, Place 1 tablet (0.4 mg total) under the tongue every 5 (five) minutes as needed for chest pain., Disp: 30 tablet, Rfl: 0   OXYGEN, Inhale 2 L into the lungs at bedtime., Disp: , Rfl:    pantoprazole (PROTONIX) 40 MG tablet, Take 1 tablet (40 mg total) by mouth daily at 12 noon. (Patient taking differently: Take 40 mg by mouth 2 (two) times daily.), Disp: 30 tablet, Rfl: 1   Polyethyl  Glycol-Propyl Glycol (SYSTANE) 0.4-0.3 % SOLN, Place 1 drop into both eyes 2 (two) times daily as needed (dry eyes)., Disp: , Rfl:    Probiotic Product (ALIGN) 4 MG CAPS, Take 4 mg by mouth daily., Disp: , Rfl:    ranolazine (RANEXA) 500 MG 12 hr tablet, TAKE ONE TABLET BY MOUTH EVERY TWELVE HOURS (Patient taking differently: 500 mg 2 (two) times daily.), Disp: 180 tablet, Rfl: 1   VYTORIN 10-20 MG per tablet, Take 1 tablet by mouth daily at 6 PM. , Disp: , Rfl:      Radiology:   CT angiogram chest 04/11/2019: Cardiovascular: There are 2 small peripheral pulmonary emboli in the right lower lobe. No other pulmonary emboli. Aortic atherosclerosis. RV LV ratio is normal. Mild cardiomegaly. No pericardial effusion.  Cardiac Studies:   Coronary angiogram 04/18/2014:  PTCA and suboptimal balloon angioplasty of mid LAD and D2.  Residual 80% stenosis in the distal LAD not amenable to intervention. LAD collateralized by RCA. Normal LVEF.  Echo- 05/03/14 1. Left ventricle cavity is normal in size. Mild concentric hypertrophy of the left ventricle. Normal  global wall motion. Calculated EF 63%. 2. Mild mitral regurgitation. 3. Mild tricuspid regurgitation. No evidence of pulmonary hypertension  Exercise myoview stress 08/11/2014: 1. The resting electrocardiogram demonstrated normal sinus rhythm, normal resting conduction, no resting arrhythmias and normal rest repolarization.  The stress electrocardiogram was normal.   The patient performed treadmill exercise using a Bruce protocol, completing 5:30 minutes.The patient completed an estimated workload of 7.05 METS, 88% of the maximum predicted heart rate.  The stress test was terminated because of fatigue and THR met. 2. Myocardial perfusion imaging is normal. Overall left ventricular systolic function was normal without regional wall motion abnormalities. The left ventricular ejection fraction was 75%.  Carotid artery duplex 03/12/2020: Duplex suggests  stenosis in the right internal carotid artery (1-15%). Duplex suggests stenosis in the left internal carotid artery (minimal). Antegrade right vertebral artery flow. Antegrade left vertebral artery flow. Compared to the study done on 04/10/2020, right ECA stenosis of 15 to 49% now appears to have regressed.  EKG:  EKG 10/14/2021: Normal sinus rhythm with rate of 50 bpm.  No evidence of ischemia.  Normal EKG.  No change from 03/16/2020.  Assessment     ICD-10-CM   1. Coronary artery disease of native artery of native heart with stable angina pectoris ([place])  I25.118 EKG 12-[place]    2. Dyspnea on exertion  R06.09     3. Primary hypertension  I10     4. History of pulmonary embolism Jan 2021 after pneumonia 05/22/21 unprovoked  Z86.711       No orders of the defined types were placed in this encounter.  Medications Discontinued During This Encounter  Medication Reason   APIXABAN (ELIQUIS) VTE STARTER PACK (10MG AND 5MG)    nitroGLYCERIN (NITRODUR - DOSED IN MG/24 HR) 0.4 mg/hr patch    Orders Placed This Encounter  Procedures   EKG 12-[place]       Recommendations:    BONNIEJEAN PIANO  is a 81 y.o. Caucasian female  follow-up of coronary artery disease h/o NSTEMI on 04/18/2014 S/P  balloon angioplasty of the LAD and D2 with 80% residual stenosis which was not amenable to further intervention due to the LAD being a small artery. and mild peripheral arterial disease, left leg sciatica and chronic back pain, hypertension and hyperlipidemia presents for annual visit.  I had seen her in March 2023 when she presented with very large bilateral pulmonary emboli leading to RV strain.  Fortunately she has done well, no clinical evidence of heart failure, she is presently on Eliquis for life in view of unprovoked pulmonary embolism.  Presently doing well, except for chronic dyspnea from underlying COPD she has fortunately not had recurrence of angina pectoris.  She is presently on 2 L nasal cannula oxygen  continuously.  Blood pressure is well controlled, otherwise stable from cardiac standpoint, labs reviewed stable lipids as well.  Carotid artery duplex reveals very minimal disease.  No surveillance is indicated.  I will see her back in a year or sooner if problems.  I have reassured the patient and her daughter at the bedside.     Adrian Prows, MD, [facility] 10/14/2021, 11:00 AM Office: [phone] Pager: [phone]

## 2021-10-15 ENCOUNTER — Other Ambulatory Visit (HOSPITAL_COMMUNITY): Payer: Self-pay

## 2021-10-15 DIAGNOSIS — I10 Essential (primary) hypertension: Secondary | ICD-10-CM | POA: Diagnosis not present

## 2021-10-15 DIAGNOSIS — F418 Other specified anxiety disorders: Secondary | ICD-10-CM | POA: Diagnosis not present

## 2021-10-15 DIAGNOSIS — R739 Hyperglycemia, unspecified: Secondary | ICD-10-CM | POA: Diagnosis not present

## 2021-10-15 DIAGNOSIS — E039 Hypothyroidism, unspecified: Secondary | ICD-10-CM | POA: Diagnosis not present

## 2021-10-15 DIAGNOSIS — D509 Iron deficiency anemia, unspecified: Secondary | ICD-10-CM | POA: Diagnosis not present

## 2021-10-21 DIAGNOSIS — D638 Anemia in other chronic diseases classified elsewhere: Secondary | ICD-10-CM | POA: Diagnosis not present

## 2021-10-21 DIAGNOSIS — E039 Hypothyroidism, unspecified: Secondary | ICD-10-CM | POA: Diagnosis not present

## 2021-10-21 DIAGNOSIS — Z0000 Encounter for general adult medical examination without abnormal findings: Secondary | ICD-10-CM | POA: Diagnosis not present

## 2021-10-21 DIAGNOSIS — R82998 Other abnormal findings in urine: Secondary | ICD-10-CM | POA: Diagnosis not present

## 2021-10-21 DIAGNOSIS — Z1331 Encounter for screening for depression: Secondary | ICD-10-CM | POA: Diagnosis not present

## 2021-10-21 DIAGNOSIS — I700 Atherosclerosis of aorta: Secondary | ICD-10-CM | POA: Diagnosis not present

## 2021-10-21 DIAGNOSIS — I130 Hypertensive heart and chronic kidney disease with heart failure and stage 1 through stage 4 chronic kidney disease, or unspecified chronic kidney disease: Secondary | ICD-10-CM | POA: Diagnosis not present

## 2021-10-21 DIAGNOSIS — E1129 Type 2 diabetes mellitus with other diabetic kidney complication: Secondary | ICD-10-CM | POA: Diagnosis not present

## 2021-10-21 DIAGNOSIS — I5042 Chronic combined systolic (congestive) and diastolic (congestive) heart failure: Secondary | ICD-10-CM | POA: Diagnosis not present

## 2021-10-21 DIAGNOSIS — E785 Hyperlipidemia, unspecified: Secondary | ICD-10-CM | POA: Diagnosis not present

## 2021-10-21 DIAGNOSIS — R109 Unspecified abdominal pain: Secondary | ICD-10-CM | POA: Diagnosis not present

## 2021-10-21 DIAGNOSIS — Z1339 Encounter for screening examination for other mental health and behavioral disorders: Secondary | ICD-10-CM | POA: Diagnosis not present

## 2021-10-28 ENCOUNTER — Other Ambulatory Visit: Payer: Self-pay | Admitting: Internal Medicine

## 2021-10-30 ENCOUNTER — Other Ambulatory Visit: Payer: Self-pay | Admitting: Internal Medicine

## 2021-10-30 DIAGNOSIS — R109 Unspecified abdominal pain: Secondary | ICD-10-CM

## 2021-10-31 ENCOUNTER — Ambulatory Visit
Admission: RE | Admit: 2021-10-31 | Discharge: 2021-10-31 | Disposition: A | Discharge status: Home / Self Care | Payer: Medicare PPO | Source: Ambulatory Visit | Attending: Internal Medicine | Admitting: Internal Medicine

## 2021-10-31 DIAGNOSIS — R109 Unspecified abdominal pain: Secondary | ICD-10-CM

## 2021-10-31 DIAGNOSIS — K5730 Diverticulosis of large intestine without perforation or abscess without bleeding: Secondary | ICD-10-CM | POA: Diagnosis not present

## 2021-10-31 MED ORDER — IOPAMIDOL (ISOVUE-300) INJECTION 61%
100.0000 mL | Freq: Once | INTRAVENOUS | Status: AC | PRN
  Administered 2021-10-31: 100 mL via INTRAVENOUS

## 2021-11-06 ENCOUNTER — Other Ambulatory Visit: Payer: Self-pay | Admitting: Cardiology

## 2021-11-06 DIAGNOSIS — I209 Angina pectoris, unspecified: Secondary | ICD-10-CM

## 2021-11-08 ENCOUNTER — Other Ambulatory Visit: Payer: Self-pay | Admitting: Cardiology

## 2021-11-08 DIAGNOSIS — I209 Angina pectoris, unspecified: Secondary | ICD-10-CM

## 2022-01-07 DIAGNOSIS — J029 Acute pharyngitis, unspecified: Secondary | ICD-10-CM | POA: Diagnosis not present

## 2022-01-07 DIAGNOSIS — E1129 Type 2 diabetes mellitus with other diabetic kidney complication: Secondary | ICD-10-CM | POA: Diagnosis not present

## 2022-01-07 DIAGNOSIS — Z1152 Encounter for screening for COVID-19: Secondary | ICD-10-CM | POA: Diagnosis not present

## 2022-01-07 DIAGNOSIS — I130 Hypertensive heart and chronic kidney disease with heart failure and stage 1 through stage 4 chronic kidney disease, or unspecified chronic kidney disease: Secondary | ICD-10-CM | POA: Diagnosis not present

## 2022-01-07 DIAGNOSIS — R0981 Nasal congestion: Secondary | ICD-10-CM | POA: Diagnosis not present

## 2022-01-07 DIAGNOSIS — J069 Acute upper respiratory infection, unspecified: Secondary | ICD-10-CM | POA: Diagnosis not present

## 2022-01-07 DIAGNOSIS — I5042 Chronic combined systolic (congestive) and diastolic (congestive) heart failure: Secondary | ICD-10-CM | POA: Diagnosis not present

## 2022-01-07 DIAGNOSIS — R051 Acute cough: Secondary | ICD-10-CM | POA: Diagnosis not present

## 2022-01-07 DIAGNOSIS — R109 Unspecified abdominal pain: Secondary | ICD-10-CM | POA: Diagnosis not present

## 2022-02-17 DIAGNOSIS — R6884 Jaw pain: Secondary | ICD-10-CM | POA: Diagnosis not present

## 2022-02-17 DIAGNOSIS — Z9981 Dependence on supplemental oxygen: Secondary | ICD-10-CM | POA: Diagnosis not present

## 2022-02-17 DIAGNOSIS — I130 Hypertensive heart and chronic kidney disease with heart failure and stage 1 through stage 4 chronic kidney disease, or unspecified chronic kidney disease: Secondary | ICD-10-CM | POA: Diagnosis not present

## 2022-02-17 DIAGNOSIS — I5042 Chronic combined systolic (congestive) and diastolic (congestive) heart failure: Secondary | ICD-10-CM | POA: Diagnosis not present

## 2022-02-17 DIAGNOSIS — E1129 Type 2 diabetes mellitus with other diabetic kidney complication: Secondary | ICD-10-CM | POA: Diagnosis not present

## 2022-03-16 DIAGNOSIS — I2609 Other pulmonary embolism with acute cor pulmonale: Secondary | ICD-10-CM | POA: Diagnosis not present

## 2022-03-16 DIAGNOSIS — J449 Chronic obstructive pulmonary disease, unspecified: Secondary | ICD-10-CM | POA: Diagnosis not present

## 2022-03-16 DIAGNOSIS — Z9981 Dependence on supplemental oxygen: Secondary | ICD-10-CM | POA: Diagnosis not present

## 2022-04-08 DIAGNOSIS — I10 Essential (primary) hypertension: Secondary | ICD-10-CM | POA: Diagnosis not present

## 2022-04-08 DIAGNOSIS — I25119 Atherosclerotic heart disease of native coronary artery with unspecified angina pectoris: Secondary | ICD-10-CM | POA: Diagnosis not present

## 2022-04-16 DIAGNOSIS — I2609 Other pulmonary embolism with acute cor pulmonale: Secondary | ICD-10-CM | POA: Diagnosis not present

## 2022-04-16 DIAGNOSIS — J449 Chronic obstructive pulmonary disease, unspecified: Secondary | ICD-10-CM | POA: Diagnosis not present

## 2022-04-16 DIAGNOSIS — Z9981 Dependence on supplemental oxygen: Secondary | ICD-10-CM | POA: Diagnosis not present

## 2022-04-23 ENCOUNTER — Encounter (HOSPITAL_BASED_OUTPATIENT_CLINIC_OR_DEPARTMENT_OTHER): Payer: Self-pay

## 2022-04-23 ENCOUNTER — Inpatient Hospital Stay (HOSPITAL_BASED_OUTPATIENT_CLINIC_OR_DEPARTMENT_OTHER)
Admission: EM | Admit: 2022-04-23 | Discharge: 2022-04-27 | DRG: 378 | Disposition: A | Discharge status: Home / Self Care | Payer: Medicare PPO | Attending: Family Medicine | Admitting: Family Medicine

## 2022-04-23 ENCOUNTER — Emergency Department (HOSPITAL_BASED_OUTPATIENT_CLINIC_OR_DEPARTMENT_OTHER): Payer: Medicare PPO | Admitting: Radiology

## 2022-04-23 ENCOUNTER — Other Ambulatory Visit: Payer: Self-pay

## 2022-04-23 DIAGNOSIS — I10 Essential (primary) hypertension: Secondary | ICD-10-CM | POA: Diagnosis present

## 2022-04-23 DIAGNOSIS — Z7901 Long term (current) use of anticoagulants: Secondary | ICD-10-CM

## 2022-04-23 DIAGNOSIS — M5432 Sciatica, left side: Secondary | ICD-10-CM | POA: Diagnosis present

## 2022-04-23 DIAGNOSIS — I129 Hypertensive chronic kidney disease with stage 1 through stage 4 chronic kidney disease, or unspecified chronic kidney disease: Secondary | ICD-10-CM | POA: Diagnosis not present

## 2022-04-23 DIAGNOSIS — D62 Acute posthemorrhagic anemia: Secondary | ICD-10-CM | POA: Diagnosis present

## 2022-04-23 DIAGNOSIS — D509 Iron deficiency anemia, unspecified: Secondary | ICD-10-CM | POA: Diagnosis present

## 2022-04-23 DIAGNOSIS — Z86711 Personal history of pulmonary embolism: Secondary | ICD-10-CM | POA: Diagnosis present

## 2022-04-23 DIAGNOSIS — I7121 Aneurysm of the ascending aorta, without rupture: Secondary | ICD-10-CM | POA: Diagnosis present

## 2022-04-23 DIAGNOSIS — Z90710 Acquired absence of both cervix and uterus: Secondary | ICD-10-CM

## 2022-04-23 DIAGNOSIS — D123 Benign neoplasm of transverse colon: Secondary | ICD-10-CM | POA: Diagnosis present

## 2022-04-23 DIAGNOSIS — Z6833 Body mass index (BMI) 33.0-33.9, adult: Secondary | ICD-10-CM | POA: Diagnosis not present

## 2022-04-23 DIAGNOSIS — I2510 Atherosclerotic heart disease of native coronary artery without angina pectoris: Secondary | ICD-10-CM | POA: Diagnosis present

## 2022-04-23 DIAGNOSIS — E785 Hyperlipidemia, unspecified: Secondary | ICD-10-CM | POA: Diagnosis present

## 2022-04-23 DIAGNOSIS — K648 Other hemorrhoids: Secondary | ICD-10-CM | POA: Diagnosis present

## 2022-04-23 DIAGNOSIS — K5731 Diverticulosis of large intestine without perforation or abscess with bleeding: Secondary | ICD-10-CM | POA: Diagnosis present

## 2022-04-23 DIAGNOSIS — D649 Anemia, unspecified: Secondary | ICD-10-CM | POA: Diagnosis not present

## 2022-04-23 DIAGNOSIS — Z9049 Acquired absence of other specified parts of digestive tract: Secondary | ICD-10-CM

## 2022-04-23 DIAGNOSIS — E1122 Type 2 diabetes mellitus with diabetic chronic kidney disease: Secondary | ICD-10-CM | POA: Diagnosis not present

## 2022-04-23 DIAGNOSIS — Z9981 Dependence on supplemental oxygen: Secondary | ICD-10-CM

## 2022-04-23 DIAGNOSIS — E119 Type 2 diabetes mellitus without complications: Secondary | ICD-10-CM

## 2022-04-23 DIAGNOSIS — K5520 Angiodysplasia of colon without hemorrhage: Secondary | ICD-10-CM | POA: Diagnosis not present

## 2022-04-23 DIAGNOSIS — M810 Age-related osteoporosis without current pathological fracture: Secondary | ICD-10-CM | POA: Diagnosis present

## 2022-04-23 DIAGNOSIS — Z888 Allergy status to other drugs, medicaments and biological substances status: Secondary | ICD-10-CM

## 2022-04-23 DIAGNOSIS — Z862 Personal history of diseases of the blood and blood-forming organs and certain disorders involving the immune mechanism: Secondary | ICD-10-CM | POA: Diagnosis not present

## 2022-04-23 DIAGNOSIS — R079 Chest pain, unspecified: Secondary | ICD-10-CM | POA: Diagnosis not present

## 2022-04-23 DIAGNOSIS — J90 Pleural effusion, not elsewhere classified: Secondary | ICD-10-CM | POA: Diagnosis not present

## 2022-04-23 DIAGNOSIS — J811 Chronic pulmonary edema: Secondary | ICD-10-CM | POA: Diagnosis not present

## 2022-04-23 DIAGNOSIS — R195 Other fecal abnormalities: Secondary | ICD-10-CM | POA: Diagnosis not present

## 2022-04-23 DIAGNOSIS — D500 Iron deficiency anemia secondary to blood loss (chronic): Secondary | ICD-10-CM | POA: Diagnosis not present

## 2022-04-23 DIAGNOSIS — G8929 Other chronic pain: Secondary | ICD-10-CM | POA: Diagnosis present

## 2022-04-23 DIAGNOSIS — K922 Gastrointestinal hemorrhage, unspecified: Secondary | ICD-10-CM | POA: Diagnosis not present

## 2022-04-23 DIAGNOSIS — K5521 Angiodysplasia of colon with hemorrhage: Principal | ICD-10-CM | POA: Diagnosis present

## 2022-04-23 DIAGNOSIS — I700 Atherosclerosis of aorta: Secondary | ICD-10-CM | POA: Diagnosis present

## 2022-04-23 DIAGNOSIS — R0789 Other chest pain: Secondary | ICD-10-CM | POA: Diagnosis not present

## 2022-04-23 DIAGNOSIS — D6859 Other primary thrombophilia: Secondary | ICD-10-CM | POA: Diagnosis present

## 2022-04-23 DIAGNOSIS — E1151 Type 2 diabetes mellitus with diabetic peripheral angiopathy without gangrene: Secondary | ICD-10-CM | POA: Diagnosis present

## 2022-04-23 DIAGNOSIS — K5730 Diverticulosis of large intestine without perforation or abscess without bleeding: Secondary | ICD-10-CM | POA: Diagnosis not present

## 2022-04-23 DIAGNOSIS — I252 Old myocardial infarction: Secondary | ICD-10-CM

## 2022-04-23 DIAGNOSIS — D631 Anemia in chronic kidney disease: Secondary | ICD-10-CM | POA: Diagnosis not present

## 2022-04-23 DIAGNOSIS — Z86010 Personal history of colonic polyps: Secondary | ICD-10-CM

## 2022-04-23 DIAGNOSIS — Z8249 Family history of ischemic heart disease and other diseases of the circulatory system: Secondary | ICD-10-CM

## 2022-04-23 DIAGNOSIS — Z79890 Hormone replacement therapy (postmenopausal): Secondary | ICD-10-CM

## 2022-04-23 DIAGNOSIS — Z79899 Other long term (current) drug therapy: Secondary | ICD-10-CM

## 2022-04-23 DIAGNOSIS — Z87891 Personal history of nicotine dependence: Secondary | ICD-10-CM

## 2022-04-23 DIAGNOSIS — Z808 Family history of malignant neoplasm of other organs or systems: Secondary | ICD-10-CM

## 2022-04-23 DIAGNOSIS — J449 Chronic obstructive pulmonary disease, unspecified: Secondary | ICD-10-CM | POA: Diagnosis present

## 2022-04-23 DIAGNOSIS — E663 Overweight: Secondary | ICD-10-CM | POA: Diagnosis present

## 2022-04-23 DIAGNOSIS — Z86718 Personal history of other venous thrombosis and embolism: Secondary | ICD-10-CM

## 2022-04-23 DIAGNOSIS — E039 Hypothyroidism, unspecified: Secondary | ICD-10-CM | POA: Diagnosis present

## 2022-04-23 DIAGNOSIS — K921 Melena: Secondary | ICD-10-CM | POA: Diagnosis not present

## 2022-04-23 DIAGNOSIS — I083 Combined rheumatic disorders of mitral, aortic and tricuspid valves: Secondary | ICD-10-CM | POA: Diagnosis not present

## 2022-04-23 DIAGNOSIS — I341 Nonrheumatic mitral (valve) prolapse: Secondary | ICD-10-CM | POA: Diagnosis present

## 2022-04-23 DIAGNOSIS — K635 Polyp of colon: Secondary | ICD-10-CM | POA: Diagnosis present

## 2022-04-23 DIAGNOSIS — N189 Chronic kidney disease, unspecified: Secondary | ICD-10-CM | POA: Diagnosis not present

## 2022-04-23 DIAGNOSIS — Z881 Allergy status to other antibiotic agents status: Secondary | ICD-10-CM

## 2022-04-23 DIAGNOSIS — Z803 Family history of malignant neoplasm of breast: Secondary | ICD-10-CM

## 2022-04-23 DIAGNOSIS — I2693 Single subsegmental pulmonary embolism without acute cor pulmonale: Secondary | ICD-10-CM | POA: Diagnosis not present

## 2022-04-23 DIAGNOSIS — I340 Nonrheumatic mitral (valve) insufficiency: Secondary | ICD-10-CM | POA: Diagnosis present

## 2022-04-23 DIAGNOSIS — D630 Anemia in neoplastic disease: Secondary | ICD-10-CM | POA: Diagnosis not present

## 2022-04-23 DIAGNOSIS — Z85828 Personal history of other malignant neoplasm of skin: Secondary | ICD-10-CM

## 2022-04-23 DIAGNOSIS — F419 Anxiety disorder, unspecified: Secondary | ICD-10-CM | POA: Diagnosis present

## 2022-04-23 DIAGNOSIS — R109 Unspecified abdominal pain: Secondary | ICD-10-CM | POA: Diagnosis not present

## 2022-04-23 DIAGNOSIS — I2699 Other pulmonary embolism without acute cor pulmonale: Secondary | ICD-10-CM | POA: Diagnosis present

## 2022-04-23 DIAGNOSIS — Z8349 Family history of other endocrine, nutritional and metabolic diseases: Secondary | ICD-10-CM

## 2022-04-23 LAB — OCCULT BLOOD X 1 CARD TO LAB, STOOL: Fecal Occult Bld: POSITIVE — AB

## 2022-04-23 LAB — BASIC METABOLIC PANEL
Anion gap: 10 (ref 5–15)
BUN: 19 mg/dL (ref 8–23)
CO2: 24 mmol/L (ref 22–32)
Calcium: 9.1 mg/dL (ref 8.9–10.3)
Chloride: 105 mmol/L (ref 98–111)
Creatinine, Ser: 0.7 mg/dL (ref 0.44–1.00)
GFR, Estimated: >60 mL/min (ref >60)
Glucose, Bld: 130 mg/dL — ABNORMAL HIGH (ref 70–99)
Potassium: 4 mmol/L (ref 3.5–5.1)
Sodium: 139 mmol/L (ref 135–145)

## 2022-04-23 LAB — CBC
HCT: 21.3 % — ABNORMAL LOW (ref 36.0–46.0)
Hemoglobin: 6.1 g/dL — CL (ref 12.0–15.0)
MCH: 21.6 pg — ABNORMAL LOW (ref 26.0–34.0)
MCHC: 28.6 g/dL — ABNORMAL LOW (ref 30.0–36.0)
MCV: 75.5 fL — ABNORMAL LOW (ref 80.0–100.0)
Platelets: 309 10*3/uL (ref 150–400)
RBC: 2.82 MIL/uL — ABNORMAL LOW (ref 3.87–5.11)
RDW: 18.9 % — ABNORMAL HIGH (ref 11.5–15.5)
WBC: 7 10*3/uL (ref 4.0–10.5)
nRBC: 0 % (ref 0.0–0.2)

## 2022-04-23 LAB — TROPONIN I (HIGH SENSITIVITY)
Troponin I (High Sensitivity): 7 ng/L (ref <18)
Troponin I (High Sensitivity): 8 ng/L (ref <18)

## 2022-04-23 LAB — CBG MONITORING, ED: Glucose-Capillary: 89 mg/dL (ref 70–99)

## 2022-04-23 LAB — D-DIMER, QUANTITATIVE: D-Dimer, Quant: 0.3 ug/mL-FEU (ref 0.00–0.50)

## 2022-04-23 MED ORDER — PANTOPRAZOLE 80MG IVPB - SIMPLE MED
80.0000 mg | Freq: Once | INTRAVENOUS | Status: AC
  Administered 2022-04-23: 80 mg via INTRAVENOUS
  Filled 2022-04-23: qty 100

## 2022-04-23 MED ORDER — PANTOPRAZOLE SODIUM 40 MG IV SOLR
INTRAVENOUS | Status: AC
  Filled 2022-04-23: qty 20

## 2022-04-23 MED ORDER — PANTOPRAZOLE INFUSION (NEW) - SIMPLE MED
8.0000 mg/h | INTRAVENOUS | Status: AC
  Administered 2022-04-23 – 2022-04-26 (×6): 8 mg/h via INTRAVENOUS
  Filled 2022-04-23 (×9): qty 100

## 2022-04-23 NOTE — ED Notes (Signed)
ED Provider at bedside. 

## 2022-04-23 NOTE — ED Provider Notes (Signed)
[facility] Provider Note   CSN: [id number] Arrival date & time: 04/23/22  1710     History  Chief Complaint  Patient presents with   Chest Pain   Arm Pain    L    Desiree Leon is a 82 y.o. female.  82 year old female with a history of CAD status post balloon angioplasty, COPD on home oxygen, iron deficiency anemia, and pulmonary embolism on Eliquis who presents emergency department with chest discomfort.  Patient reports that over the past 4 days to started noticing that she has discomfort in her chest that radiates to both shoulders with exertion.  Says that over the past several weeks has had increasing shortness of breath and fatigue.  Says that her stools have been black since starting iron a long time ago.  No bloody stools.  No abdominal pain.  No history of liver dz or heavy alcohol use.       Home Medications Prior to Admission medications   Medication Sig Start Date End Date Taking? Authorizing Provider  acetaminophen (TYLENOL) 500 MG tablet Take 500 mg by mouth daily as needed for fever or headache.   Yes [provider]  ALPRAZolam (XANAX) 0.25 MG tablet Take 0.25 mg by mouth daily as needed for anxiety.   Yes [provider]  budesonide-formoterol (SYMBICORT) 160-4.5 MCG/ACT inhaler Inhale 2 puffs into the lungs 2 (two) times daily. 06/24/21  Yes Freddi Starr, MD  cholecalciferol (VITAMIN D) 25 MCG (1000 UNIT) tablet Take 2,000 Units by mouth daily.   Yes [provider]  COLLAGEN PO Take 10 g by mouth daily.   Yes [provider]  escitalopram (LEXAPRO) 5 MG tablet Take 5 mg by mouth every evening.    Yes [provider]  iron polysaccharides (NIFEREX) 150 MG capsule Take 150 mg by mouth daily.   Yes [provider]  Levothyroxine Sodium 137 MCG CAPS Take 137 mcg by mouth daily before breakfast.  08/25/11  Yes [provider]  methocarbamol (ROBAXIN) 500  MG tablet Take 500 mg by mouth daily as needed for muscle spasms. 09/26/19  Yes [provider]  metoprolol tartrate (LOPRESSOR) 25 MG tablet Take 25 mg by mouth 2 (two) times daily.  09/12/11  Yes [provider]  multivitamin-iron-minerals-folic acid (CENTRUM) chewable tablet Chew 1 tablet by mouth daily.   Yes [provider]  niCARdipine (CARDENE) 20 MG capsule Take 20 mg by mouth every evening.  09/12/11  Yes [provider]  nitroGLYCERIN (NITRODUR - DOSED IN MG/24 HR) 0.4 mg/hr patch Place 0.4 mg onto the skin daily.   Yes [provider]  nitroGLYCERIN (NITROSTAT) 0.4 MG SL tablet Place 1 tablet (0.4 mg total) under the tongue every 5 (five) minutes as needed for chest pain. 05/28/21  Yes Adrian Prows, MD  oxyCODONE (OXY IR/ROXICODONE) 5 MG immediate release tablet Take 5 mg by mouth daily as needed for severe pain.   Yes [provider]  OXYGEN Inhale 2.5 L into the lungs at bedtime.   Yes [provider]  pantoprazole (PROTONIX) 40 MG tablet Take 1 tablet (40 mg total) by mouth daily at 12 noon. Patient taking differently: Take 40 mg by mouth 2 (two) times daily. 04/21/14  Yes Barton Dubois, MD  Polyethyl Glycol-Propyl Glycol (SYSTANE) 0.4-0.3 % SOLN Place 1 drop into both eyes daily.   Yes [provider]  Probiotic Product (ALIGN) 4 MG CAPS Take 4 mg by mouth  daily.   Yes [provider]  ranolazine (RANEXA) 500 MG 12 hr tablet TAKE ONE TABLET BY MOUTH EVERY TWELVE HOURS Patient taking differently: 500 mg 2 (two) times daily. 04/19/21  Yes Cantwell, Celeste C, PA-C  VYTORIN 10-20 MG per tablet Take 1 tablet by mouth daily at 6 PM.  09/18/11  Yes [provider]  apixaban (ELIQUIS) 5 MG TABS tablet Take 1 tablet (5 mg total) by mouth 2 (two) times daily. Patient not taking: Reported on 04/24/2022 06/22/21   Barb Merino, MD  glucose blood test strip Accu-Chek SmartView Test Strips  CHECK BLOOD SUGAR TWICE  DAILY.    [provider]  levalbuterol Penne Lash HFA) 45 MCG/ACT inhaler Inhale 2 puffs into the lungs every 4 (four) hours as needed for wheezing. Patient not taking: Reported on 04/24/2022 06/24/21 06/24/22  Freddi Starr, MD  fluticasone [facility]) 50 MCG/ACT nasal spray Place 2 sprays into both nostrils daily. 04/04/19 09/12/19  [provider]      Allergies    Other, Dilaudid [hydromorphone hcl], and Erythromycin    Review of Systems   Review of Systems  Physical Exam Updated Vital Signs BP (!) 138/42 (BP Location: Right Arm)   Pulse 63   Temp 98.6 F (37 C) (Oral)   Resp 16   Ht 5' 3"$  (1.6 m)   Wt 84.6 kg   SpO2 100%   BMI 33.04 kg/m  Physical Exam Vitals and nursing note reviewed.  Constitutional:      General: She is not in acute distress.    Appearance: She is well-developed.  HENT:     Head: Normocephalic and atraumatic.  Eyes:     Conjunctiva/sclera: Conjunctivae normal.  Cardiovascular:     Rate and Rhythm: Normal rate and regular rhythm.     Heart sounds: No murmur heard. Pulmonary:     Effort: Pulmonary effort is normal. No respiratory distress.     Breath sounds: Normal breath sounds.     Comments: On nasal cannula Abdominal:     General: There is no distension.     Palpations: Abdomen is soft. There is no mass.     Tenderness: There is no abdominal tenderness. There is no guarding.  Genitourinary:    Comments: Chaperoned by RN with melanotic Hemoccult positive stool Musculoskeletal:        General: No swelling.     Cervical back: Neck supple.  Skin:    General: Skin is warm and dry.     Capillary Refill: Capillary refill takes less than 2 seconds.  Neurological:     Mental Status: She is alert.  Psychiatric:        Mood and Affect: Mood normal.     ED Results / Procedures / Treatments   Labs (all labs ordered are listed, but only abnormal results are displayed) Labs Reviewed  BASIC METABOLIC PANEL - Abnormal; Notable for  the following components:      Result Value   Glucose, Bld 130 (*)    All other components within normal limits  CBC - Abnormal; Notable for the following components:   RBC 2.82 (*)    Hemoglobin 6.1 (*)    HCT 21.3 (*)    MCV 75.5 (*)    MCH 21.6 (*)    MCHC 28.6 (*)    RDW 18.9 (*)    All other components within normal limits  OCCULT BLOOD X 1 CARD TO LAB, STOOL - Abnormal; Notable for the following components:  Fecal Occult Bld POSITIVE (*)    All other components within normal limits  GLUCOSE, CAPILLARY - Abnormal; Notable for the following components:   Glucose-Capillary 112 (*)    All other components within normal limits  HEMOGLOBIN AND HEMATOCRIT, BLOOD - Abnormal; Notable for the following components:   Hemoglobin 7.5 (*)    HCT 24.9 (*)    All other components within normal limits  GLUCOSE, CAPILLARY - Abnormal; Notable for the following components:   Glucose-Capillary 164 (*)    All other components within normal limits  HEMOGLOBIN AND HEMATOCRIT, BLOOD - Abnormal; Notable for the following components:   Hemoglobin 7.1 (*)    HCT 24.1 (*)    All other components within normal limits  GLUCOSE, CAPILLARY - Abnormal; Notable for the following components:   Glucose-Capillary 120 (*)    All other components within normal limits  GLUCOSE, CAPILLARY - Abnormal; Notable for the following components:   Glucose-Capillary 105 (*)    All other components within normal limits  MRSA NEXT GEN BY PCR, NASAL  D-DIMER, QUANTITATIVE  GLUCOSE, CAPILLARY  HEMOGLOBIN AND HEMATOCRIT, BLOOD  HEMOGLOBIN AND HEMATOCRIT, BLOOD  HEMOGLOBIN AND HEMATOCRIT, BLOOD  CBG MONITORING, ED  TYPE AND SCREEN  PREPARE RBC (CROSSMATCH)  ABO/RH  TROPONIN I (HIGH SENSITIVITY)  TROPONIN I (HIGH SENSITIVITY)    EKG EKG Interpretation  Date/Time:  Wednesday April 23 2022 17:20:31 EST Ventricular Rate:  64 PR Interval:  174 QRS Duration: 92 QT Interval:  438 QTC Calculation: 451 R  Axis:   65 Text Interpretation: Normal sinus rhythm Low voltage QRS Borderline ECG When compared with ECG of 21-May-2021 21:21, PREVIOUS ECG IS PRESENT Confirmed by Margaretmary Eddy [phone]) on 04/23/2022 6:46:19 PM  Radiology ECHOCARDIOGRAM COMPLETE  Result Date: 04/24/2022    ECHOCARDIOGRAM REPORT   Patient Name:   LUCEIL DANSKY Date of Exam: 04/24/2022 Medical Rec #:  [id number]      Height:       63.0 in Accession #:    [id number]     Weight:       186.5 lb Date of Birth:  30-Apr-1940      BSA:          1.877 m Patient Age:    64 years       BP:           134/52 mmHg Patient Gender: F              HR:           55 bpm. Exam Location:  Inpatient Procedure: 2D Echo, Cardiac Doppler and Color Doppler Indications:     Chest Pain R07.9  History:         Patient has prior history of Echocardiogram examinations, most                  recent 05/22/2021. Previous Myocardial Infarction, Mitral Valve                  Disease, Signs/Symptoms:Murmur; Risk Factors:Diabetes,                  Hypertension, Former Smoker and Dyslipidemia.  Sonographer:     Greer Pickerel Referring Phys:  [id number] [facility] J PATWARDHAN Diagnosing Phys: Vernell Leep MD  Sonographer Comments: Image acquisition challenging due to patient body habitus and Image acquisition challenging due to respiratory motion. IMPRESSIONS  1. Left ventricular ejection fraction, by estimation, is 65 to 70%. The left ventricle has normal function. The  left ventricle has no regional wall motion abnormalities. Left ventricular diastolic parameters are consistent with Grade II diastolic dysfunction (pseudonormalization). Elevated left atrial pressure.  2. Right ventricular systolic function is normal. The right ventricular size is normal. There is normal pulmonary artery systolic pressure.  3. Left atrial size was moderately dilated.  4. Right atrial size was mildly dilated.  5. The mitral valve is grossly normal. Mild to moderate mitral valve regurgitation.  6. The  aortic valve is grossly normal. There is mild calcification of the aortic valve. Aortic valve regurgitation is not visualized.  7. Aortic dilatation noted. Aneurysm of the ascending aorta, measuring 40 mm.  8. The inferior vena cava is dilated in size with <50% respiratory variability, suggesting right atrial pressure of 15 mmHg. Comparison(s): A prior study was performed on 05/22/2021. Atrial dilatation, diastolic dysfunction, MR, TR, ascending aorta dilatation are new findings. FINDINGS  Left Ventricle: Left ventricular ejection fraction, by estimation, is 65 to 70%. The left ventricle has normal function. The left ventricle has no regional wall motion abnormalities. The left ventricular internal cavity size was normal in size. There is  no left ventricular hypertrophy. Left ventricular diastolic parameters are consistent with Grade II diastolic dysfunction (pseudonormalization). Elevated left atrial pressure. Right Ventricle: The right ventricular size is normal. No increase in right ventricular wall thickness. Right ventricular systolic function is normal. There is normal pulmonary artery systolic pressure. The tricuspid regurgitant velocity is 2.28 m/s, and  with an assumed right atrial pressure of 8 mmHg, the estimated right ventricular systolic pressure is 0000000 mmHg. Left Atrium: Left atrial size was moderately dilated. Right Atrium: Right atrial size was mildly dilated. Pericardium: There is no evidence of pericardial effusion. Mitral Valve: The mitral valve is grossly normal. Mild to moderate mitral valve regurgitation. Tricuspid Valve: The tricuspid valve is normal in structure. Tricuspid valve regurgitation is mild . No evidence of tricuspid stenosis. Aortic Valve: The aortic valve is grossly normal. There is mild calcification of the aortic valve. Aortic valve regurgitation is not visualized. Pulmonic Valve: The pulmonic valve was normal in structure. Pulmonic valve regurgitation is not visualized. No  evidence of pulmonic stenosis. Aorta: Aortic dilatation noted. There is an aneurysm involving the ascending aorta measuring 40 mm. Venous: The inferior vena cava is dilated in size with less than 50% respiratory variability, suggesting right atrial pressure of 15 mmHg. IAS/Shunts: The interatrial septum was not well visualized.  LEFT VENTRICLE PLAX 2D LVIDd:         5.30 cm   Diastology LVIDs:         3.40 cm   LV e' medial:    6.32 cm/s LV PW:         1.00 cm   LV E/e' medial:  21.2 LV IVS:        1.10 cm   LV e' lateral:   8.84 cm/s LVOT diam:     1.80 cm   LV E/e' lateral: 15.2 LV SV:         85 LV SV Index:   45 LVOT Area:     2.54 cm  RIGHT VENTRICLE RV S prime:     12.00 cm/s TAPSE (M-mode): 1.9 cm LEFT ATRIUM             Index        RIGHT ATRIUM           Index LA diam:        4.20 cm 2.24 cm/m   RA  Area:     19.60 cm LA Vol (A2C):   86.4 ml 46.03 ml/m  RA Volume:   55.60 ml  29.62 ml/m LA Vol (A4C):   87.7 ml 46.72 ml/m LA Biplane Vol: 91.5 ml 48.75 ml/m  AORTIC VALVE LVOT Vmax:   133.00 cm/s LVOT Vmean:  86.200 cm/s LVOT VTI:    0.335 m  AORTA Ao Root diam: 3.70 cm Ao Asc diam:  4.00 cm MITRAL VALVE                TRICUSPID VALVE MV Area (PHT): 3.03 cm     TR Peak grad:   20.8 mmHg MV Decel Time: 250 msec     TR Vmax:        228.00 cm/s MR Peak grad: 103.6 mmHg MR Vmax:      509.00 cm/s   SHUNTS MV E velocity: 134.00 cm/s  Systemic VTI:  0.34 m MV A velocity: 99.10 cm/s   Systemic Diam: 1.80 cm MV E/A ratio:  1.35 Manish Patwardhan MD Electronically signed by Vernell Leep MD Signature Date/Time: 04/24/2022/5:55:57 PM    Final    DG Chest Port 1 View  Result Date: 04/23/2022 CLINICAL DATA:  Chest pain EXAM: PORTABLE CHEST 1 VIEW COMPARISON:  Chest x-ray 05/21/2021 FINDINGS: Heart is enlarged. There are increased central interstitial markings bilaterally with a small left pleural effusion. There is no pneumothorax or acute fracture. IMPRESSION: Cardiomegaly with pulmonary edema and small left  pleural effusion. Electronically Signed   By: Ronney Asters M.D.   On: 04/23/2022 17:58    Procedures Procedures   Medications Ordered in ED Medications  pantoprozole (PROTONIX) 80 mg /NS 100 mL infusion (8 mg/hr Intravenous New Bag/Given 04/25/22 0026)  pantoprazole (PROTONIX) 40 MG injection (  Not Given 04/23/22 1854)  0.9 %  sodium chloride infusion (Manually program via Guardrails IV Fluids) (has no administration in time range)  ondansetron (ZOFRAN) tablet 4 mg (has no administration in time range)    Or  ondansetron (ZOFRAN) injection 4 mg (has no administration in time range)  0.9 %  sodium chloride infusion (has no administration in time range)  hydrocortisone cream 0.5 % (has no administration in time range)  pantoprazole (PROTONIX) 80 mg /NS 100 mL IVPB (0 mg Intravenous Stopped 04/23/22 1929)  furosemide (LASIX) injection 20 mg (20 mg Intravenous Given 04/24/22 1820)    ED Course/ Medical Decision Making/ A&P Clinical Course as of 04/25/22 0100  Wed Apr 23, 2022  1844 Dr Therisa Doyne from GI aware.  Patient has had several scopes in the past and did have 1 ulcer and reports several polyps. [RP]  1905 Dr Trilby Drummer [RP]    Clinical Course User Index [RP] Fransico Meadow, MD                            Medical Decision Making Amount and/or Complexity of Data Reviewed Labs: ordered. Radiology: ordered.  Risk Decision regarding hospitalization.   Desiree Leon is a 82 y.o. female with comorbidities that complicate the patient evaluation including CAD status post balloon angioplasty, COPD on home oxygen, iron deficiency anemia, and pulmonary embolism on Eliquis who presents emergency department with chest discomfort, shortness of breath, and dark stools  Initial Ddx:  GIB, symptomatic anemia, MI, PE, COPD  MDM:  Feel the patient may be having symptomatic anemia from GI bleed.  Could potentially also represent an MI so will get EKG and troponins.  Considered pulmonary embolism  but feels less likely given her dark stools and anticoagulation may be contraindicated at this time she is bleeding.  Also considered COPD but has clear lung sounds and feels less likely.  Plan:  Labs Protonix Chest x-ray EKG  ED Summary/Re-evaluation:  Patient underwent the above workup which showed a hemoglobin of 6.1 which is an acute drop.  Was discussed with GI who will evaluate who will follow.  Also discussed with medicine who will admit the patient.  She was started on Protonix drip.  Did not have blood to transfuse our facility so will require transfusion when she arrives at her fracture her final destination.   This patient presents to the ED for concern of complaints listed in HPI, this involves an extensive number of treatment options, and is a complaint that carries with it a high risk of complications and morbidity. Disposition including potential need for admission considered.   Dispo: Admit to Floor  Additional history obtained from family Records reviewed Admission Notes The following labs were independently interpreted: CBC and show acute anemia I independently reviewed the following imaging with scope of interpretation limited to determining acute life threatening conditions related to emergency care: Chest x-ray and agree with the radiologist interpretation with the following exceptions: none I personally reviewed and interpreted cardiac monitoring: normal sinus rhythm  I personally reviewed and interpreted the pt's EKG: see above for interpretation  I have reviewed the patients home medications and made adjustments as needed Consults: Gastroenterology and Hospitalist Social Determinants of health:  Elderly  Final Clinical Impression(s) / ED Diagnoses Final diagnoses:  Gastrointestinal hemorrhage, unspecified gastrointestinal hemorrhage type  Symptomatic anemia    Rx / DC Orders ED Discharge Orders     None      CRITICAL CARE Performed by: Fransico Meadow   Total critical care time: 30 minutes  Critical care time was exclusive of separately billable procedures and treating other patients.  Critical care was necessary to treat or prevent imminent or life-threatening deterioration.  Critical care was time spent personally by me on the following activities: development of treatment plan with patient and/or surrogate as well as nursing, discussions with consultants, evaluation of patient's response to treatment, examination of patient, obtaining history from patient or surrogate, ordering and performing treatments and interventions, ordering and review of laboratory studies, ordering and review of radiographic studies, pulse oximetry and re-evaluation of patient's condition.    Fransico Meadow, MD 04/25/22 0100

## 2022-04-23 NOTE — ED Notes (Signed)
CRITICAL VALUE STICKER  CRITICAL VALUE:Hgb 6.1  RECEIVER (on-site recipient of call):Shawnie Pons, RN  DATE & TIME NOTIFIED:   MESSENGER (representative from lab):  MD NOTIFIED: Philip Aspen  TIME OF NOTIFICATION:1757  RESPONSE:

## 2022-04-23 NOTE — ED Notes (Addendum)
-  Called [facility] for transportation. -Spoke to Advertising account executive who verified bed was cleaned & ready.

## 2022-04-23 NOTE — ED Triage Notes (Signed)
Pt states PCP thinks she's having "mild heart attack-" c/o L sided jaw, arm, "aching" shoulder pain, chest pain/ tightness, pain worse w exertion 2.5L [place], hx COPD

## 2022-04-24 ENCOUNTER — Observation Stay (HOSPITAL_COMMUNITY): Payer: Medicare PPO

## 2022-04-24 DIAGNOSIS — E119 Type 2 diabetes mellitus without complications: Secondary | ICD-10-CM

## 2022-04-24 DIAGNOSIS — E039 Hypothyroidism, unspecified: Secondary | ICD-10-CM

## 2022-04-24 DIAGNOSIS — D649 Anemia, unspecified: Secondary | ICD-10-CM | POA: Diagnosis not present

## 2022-04-24 DIAGNOSIS — I10 Essential (primary) hypertension: Secondary | ICD-10-CM

## 2022-04-24 DIAGNOSIS — K922 Gastrointestinal hemorrhage, unspecified: Secondary | ICD-10-CM | POA: Diagnosis not present

## 2022-04-24 DIAGNOSIS — Z86711 Personal history of pulmonary embolism: Secondary | ICD-10-CM | POA: Diagnosis not present

## 2022-04-24 DIAGNOSIS — Z862 Personal history of diseases of the blood and blood-forming organs and certain disorders involving the immune mechanism: Secondary | ICD-10-CM

## 2022-04-24 LAB — GLUCOSE, CAPILLARY
Glucose-Capillary: 112 mg/dL — ABNORMAL HIGH (ref 70–99)
Glucose-Capillary: 164 mg/dL — ABNORMAL HIGH (ref 70–99)
Glucose-Capillary: 84 mg/dL (ref 70–99)
Glucose-Capillary: 120 mg/dL — ABNORMAL HIGH (ref 70–99)
Glucose-Capillary: 105 mg/dL — ABNORMAL HIGH (ref 70–99)

## 2022-04-24 LAB — HEMOGLOBIN AND HEMATOCRIT, BLOOD
HCT: 24.1 % — ABNORMAL LOW (ref 36.0–46.0)
HCT: 24.9 % — ABNORMAL LOW (ref 36.0–46.0)
Hemoglobin: 7.1 g/dL — ABNORMAL LOW (ref 12.0–15.0)
Hemoglobin: 7.5 g/dL — ABNORMAL LOW (ref 12.0–15.0)

## 2022-04-24 LAB — PREPARE RBC (CROSSMATCH)

## 2022-04-24 LAB — ABO/RH: ABO/RH(D): O NEG

## 2022-04-24 LAB — ECHOCARDIOGRAM COMPLETE
Area-P 1/2: 3.03 cm2
Height: 63 in
MV M vel: 5.09 m/s
MV Peak grad: 103.6 mmHg
S' Lateral: 3.4 cm
Weight: 2984.15 oz

## 2022-04-24 LAB — MRSA NEXT GEN BY PCR, NASAL: MRSA by PCR Next Gen: NOT DETECTED

## 2022-04-24 MED ORDER — SODIUM CHLORIDE 0.9 % IV SOLN: INTRAVENOUS | Status: DC

## 2022-04-24 MED ORDER — ONDANSETRON HCL 4 MG/2ML IJ SOLN: 4.0000 mg | Freq: Four times a day (QID) | INTRAMUSCULAR | Status: DC | PRN

## 2022-04-24 MED ORDER — SODIUM CHLORIDE 0.9% IV SOLUTION: Freq: Once | INTRAVENOUS | Status: DC

## 2022-04-24 MED ORDER — FUROSEMIDE 10 MG/ML IJ SOLN
20.0000 mg | Freq: Once | INTRAMUSCULAR | Status: AC
  Administered 2022-04-24: 20 mg via INTRAVENOUS
  Filled 2022-04-24: qty 2

## 2022-04-24 MED ORDER — HYDROCORTISONE 0.5 % EX CREA
TOPICAL_CREAM | Freq: Three times a day (TID) | CUTANEOUS | Status: DC | PRN
  Filled 2022-04-24: qty 28.35

## 2022-04-24 MED ORDER — ONDANSETRON HCL 4 MG PO TABS: 4.0000 mg | ORAL_TABLET | Freq: Four times a day (QID) | ORAL | Status: DC | PRN

## 2022-04-24 NOTE — Progress Notes (Signed)
Echocardiogram 04/24/2022: 1. Left ventricular ejection fraction, by estimation, is 65 to 70%. The  left ventricle has normal function. The left ventricle has no regional  wall motion abnormalities. Left ventricular diastolic parameters are  consistent with Grade II diastolic  dysfunction (pseudonormalization). Elevated left atrial pressure.   2. Right ventricular systolic function is normal. The right ventricular  size is normal. There is normal pulmonary artery systolic pressure.   3. Left atrial size was moderately dilated.   4. Right atrial size was mildly dilated.   5. The mitral valve is grossly normal. Mild to moderate mitral valve  regurgitation.   6. The aortic valve is grossly normal. There is mild calcification of the  aortic valve. Aortic valve regurgitation is not visualized.   7. Aortic dilatation noted. Aneurysm of the ascending aorta, measuring 40  mm.   8. The inferior vena cava is dilated in size with <50% respiratory  variability, suggesting right atrial pressure of 15 mmHg.   Comparison(s): A prior study was performed on 05/22/2021. Atrial  dilatation, diastolic dysfunction, MR, TR, ascending aorta dilatation are  new findings.   Okay to proceed with GI workup, but given the above findings, recommend use of IV lasix between PRBC transfusions., I ordered IV lasix 20 mg once for now.   Nigel Mormon, MD Pager: [phone] Office: [phone]

## 2022-04-24 NOTE — Assessment & Plan Note (Addendum)
Due to GIB in setting of chronic eliquis use. Hold eliquis Transfuse 1u PRBC (being conservative with transfusion given pts self reported h/o CHF, though Dr. Einar Gip didn't seem very impressed for chronic CHF in his latest office note, normal EF in March 2023). H/H Q8H Tele monitor

## 2022-04-24 NOTE — Assessment & Plan Note (Signed)
Resume home BP meds when med rec is completed.

## 2022-04-24 NOTE — Assessment & Plan Note (Addendum)
?   Melena, has chronic black stools in setting of chronic PO iron used to treat iron def anemia, however is hemoccult positive today and although she has h/o constipation with the iron she has noted that she has had more diarrhea lately. EDP spoke with GI reportedly. PPI GTT Clear liquid diet

## 2022-04-24 NOTE — H&P (View-Only) (Signed)
[facility] Gastroenterology Consultation Note  Referring Provider: No ref. provider found Primary Care Physician:  Burnard Bunting, MD  Reason for Consultation:  melena, anemia  HPI: Desiree Leon is a 82 y.o. female multiple comorbidities.  Several weeks of progressive weakness, dyspnea and dark stools (chronically on iron).  Had endoscopy/colonoscopy last 2012.  On Eliquis.  Some generalized abdominal pain for several months.  Found to have Hgb 6 (11 baseline).  Stools hemoccult-positive.   Past Medical History:  Diagnosis Date   Anxiety    Blood transfusion    CAP (community acquired pneumonia)    COPD (chronic obstructive pulmonary disease) ([place])    Heart murmur    History of colon polyps    Hyperlipidemia    Hypertension    MI (myocardial infarction) ([place])    Mitral valve prolapse    NSTEMI (non-ST elevated myocardial infarction) ([place])    Osteoarthritis    Osteoporosis    Pulmonary emboli ([place]) 04/12/2019   Spinal stenosis    Thyroid disease    hypothyroidism   Unstable angina ([place]) 05/08/2014   Venous insufficiency     Past Surgical History:  Procedure Laterality Date   ABDOMINAL HYSTERECTOMY  11/26/1976   APPENDECTOMY  02/14/1978   BASAL CELL CARCINOMA EXCISION  01/26/1990   COLONOSCOPY  1993, 2003, 2008, 2012   DILATION AND CURETTAGE OF UTERUS  09/1976   LEFT HEART CATHETERIZATION WITH CORONARY ANGIOGRAM N/A 04/18/2014   Procedure: LEFT HEART CATHETERIZATION WITH CORONARY ANGIOGRAM;  Surgeon: Peter M Martinique, MD;  Location: [facility] CATH LAB;  Service: Cardiovascular;  Laterality: N/A;   MASS EXCISION  10/20/2011   Procedure: MINOR EXCISION OF MASS;  Surgeon: Adin Hector, MD;  Location: [place];  Service: General;  Laterality: N/A;  Excision mass posterior neck   OOPHORECTOMY  02/14/1978   SHOULDER SURGERY  10/18/2007   basal cell   SQUAMOUS CELL CARCINOMA EXCISION     TONSILLECTOMY  1955   TUBAL LIGATION  07/08/74    Prior to Admission medications    Medication Sig Start Date End Date Taking? Authorizing Provider  acetaminophen (TYLENOL) 500 MG tablet Take 500 mg by mouth daily as needed for fever or headache.   Yes [provider]  ALPRAZolam (XANAX) 0.25 MG tablet Take 0.25 mg by mouth daily as needed for anxiety.   Yes [provider]  budesonide-formoterol (SYMBICORT) 160-4.5 MCG/ACT inhaler Inhale 2 puffs into the lungs 2 (two) times daily. 06/24/21  Yes Freddi Starr, MD  cholecalciferol (VITAMIN D) 25 MCG (1000 UNIT) tablet Take 2,000 Units by mouth daily.   Yes [provider]  COLLAGEN PO Take 10 g by mouth daily.   Yes [provider]  escitalopram (LEXAPRO) 5 MG tablet Take 5 mg by mouth every evening.    Yes [provider]  iron polysaccharides (NIFEREX) 150 MG capsule Take 150 mg by mouth daily.   Yes [provider]  Levothyroxine Sodium 137 MCG CAPS Take 137 mcg by mouth daily before breakfast.  08/25/11  Yes [provider]  methocarbamol (ROBAXIN) 500 MG tablet Take 500 mg by mouth daily as needed for muscle spasms. 09/26/19  Yes [provider]  metoprolol tartrate (LOPRESSOR) 25 MG tablet Take 25 mg by mouth 2 (two) times daily.  09/12/11  Yes [provider]  multivitamin-iron-minerals-folic acid (CENTRUM) chewable tablet Chew 1 tablet by mouth daily.   Yes [provider]  niCARdipine (CARDENE) 20 MG capsule Take 20 mg by  mouth every evening.  09/12/11  Yes [provider]  nitroGLYCERIN (NITRODUR - DOSED IN MG/24 HR) 0.4 mg/hr patch Place 0.4 mg onto the skin daily.   Yes [provider]  nitroGLYCERIN (NITROSTAT) 0.4 MG SL tablet Place 1 tablet (0.4 mg total) under the tongue every 5 (five) minutes as needed for chest pain. 05/28/21  Yes Adrian Prows, MD  oxyCODONE (OXY IR/ROXICODONE) 5 MG immediate release tablet Take 5 mg by mouth daily as needed for severe pain.   Yes [provider]  OXYGEN Inhale 2.5 L  into the lungs at bedtime.   Yes [provider]  pantoprazole (PROTONIX) 40 MG tablet Take 1 tablet (40 mg total) by mouth daily at 12 noon. Patient taking differently: Take 40 mg by mouth 2 (two) times daily. 04/21/14  Yes Barton Dubois, MD  Polyethyl Glycol-Propyl Glycol (SYSTANE) 0.4-0.3 % SOLN Place 1 drop into both eyes daily.   Yes [provider]  Probiotic Product (ALIGN) 4 MG CAPS Take 4 mg by mouth daily.   Yes [provider]  ranolazine (RANEXA) 500 MG 12 hr tablet TAKE ONE TABLET BY MOUTH EVERY TWELVE HOURS Patient taking differently: 500 mg 2 (two) times daily. 04/19/21  Yes Cantwell, Celeste C, PA-C  VYTORIN 10-20 MG per tablet Take 1 tablet by mouth daily at 6 PM.  09/18/11  Yes [provider]  apixaban (ELIQUIS) 5 MG TABS tablet Take 1 tablet (5 mg total) by mouth 2 (two) times daily. Patient not taking: Reported on 04/24/2022 06/22/21   Barb Merino, MD  glucose blood test strip Accu-Chek SmartView Test Strips  CHECK BLOOD SUGAR TWICE DAILY.    [provider]  levalbuterol Penne Lash HFA) 45 MCG/ACT inhaler Inhale 2 puffs into the lungs every 4 (four) hours as needed for wheezing. Patient not taking: Reported on 04/24/2022 06/24/21 06/24/22  Freddi Starr, MD  fluticasone [facility]) 50 MCG/ACT nasal spray Place 2 sprays into both nostrils daily. 04/04/19 09/12/19  [provider]    Current Facility-Administered Medications  Medication Dose Route Frequency Provider Last Rate Last Admin   0.9 %  sodium chloride infusion (Manually program via Guardrails IV Fluids)   Intravenous Once Etta Quill, DO       ondansetron [facility]) tablet 4 mg  4 mg Oral Q6H PRN Etta Quill, DO       Or   ondansetron [facility]) injection 4 mg  4 mg Intravenous Q6H PRN Etta Quill, DO       pantoprozole (PROTONIX) 80 mg /NS 100 mL infusion  8 mg/hr Intravenous Continuous Fransico Meadow, MD   Stopped at 04/24/22 [id number]    Allergies as  of 04/23/2022 - Review Complete 04/23/2022  Allergen Reaction Noted   Other Anaphylaxis and Cough 05/09/2014   Dilaudid [hydromorphone hcl] Nausea Only 05/09/2014   Erythromycin Other (See Comments) 04/23/2020    Family History  Problem Relation Age of Onset   Heart disease Mother    Heart disease Father    Cancer Sister        breast   Cancer Daughter        cervical and bone   Heart disease Sister    Thyroid disease Sister     Social History   Socioeconomic History   Marital status: Widowed    Spouse name: Not on file   Number of children: 3   Years of education: Not on file   Highest education level: Not on file  Occupational History   Not on file  Tobacco Use   Smoking status: Former    Types: Cigarettes    Quit date: 03/10/2005    Years since quitting: 17.1   Smokeless tobacco: Never  Vaping Use   Vaping Use: Never used  Substance and Sexual Activity   Alcohol use: No   Drug use: No   Sexual activity: Not on file  Other Topics Concern   Not on file  Social History Narrative   Not on file   Social Determinants of Health   Financial Resource Strain: Not on file  Food Insecurity: No Food Insecurity (04/23/2022)   Hunger Vital Sign    Worried About Running Out of Food in the Last Year: Never true    Ran Out of Food in the Last Year: Never true  Transportation Needs: No Transportation Needs (04/23/2022)   PRAPARE - Hydrologist (Medical): No    Lack of Transportation (Non-Medical): No  Physical Activity: Not on file  Stress: Not on file  Social Connections: Not on file  Intimate Partner Violence: Not At Risk (04/23/2022)   Humiliation, Afraid, Rape, and Kick questionnaire    Fear of Current or Ex-Partner: No    Emotionally Abused: No    Physically Abused: No    Sexually Abused: No    Review of Systems: As per HPI, all others negative  Physical Exam: Vital signs in last 24 hours: Temp:  [98 F (36.7 C)-98.9 F (37.2 C)]  98.9 F (37.2 C) (02/15 1056) Pulse Rate:  [53-70] 56 (02/15 1100) Resp:  [11-24] 14 (02/15 1100) BP: (123-149)/(41-62) 149/53 (02/15 1100) SpO2:  [96 %-100 %] 100 % (02/15 1100) Weight:  [84.6 kg] 84.6 kg (02/14 2318) Last BM Date : 04/23/22 General:   Alert,  overweight, chronically ill-appearing, pleasant and cooperative in NAD Head:  Normocephalic and atraumatic. Eyes:  Sclera clear, no icterus.   Conjunctiva pink. Ears:  Normal auditory acuity. Nose:  No deformity, discharge,  or lesions. Mouth:  No deformity or lesions.  Oropharynx pink & moist. Neck:  Supple; no masses or thyromegaly. Lungs:  No distress Abdomen:  Soft, nontender and nondistended. No masses, hepatosplenomegaly or hernias noted. Normal bowel sounds, without guarding, and without rebound.     Msk:  Symmetrical without gross deformities. Normal posture. Pulses:  Normal pulses noted. Extremities:  Without clubbing or edema. Neurologic:  Alert and  oriented x4;  grossly normal neurologically. Skin:  Intact without significant lesions or rashes. Psych:  Alert and cooperative. Normal mood and affect.   Lab Results: Recent Labs    04/23/22 1744  WBC 7.0  HGB 6.1*  HCT 21.3*  PLT 309   BMET Recent Labs    04/23/22 1744  NA 139  K 4.0  CL 105  CO2 24  GLUCOSE 130*  BUN 19  CREATININE 0.70  CALCIUM 9.1   LFT No results for input(s): "PROT", "ALBUMIN", "AST", "ALT", "ALKPHOS", "BILITOT", "BILIDIR", "IBILI" in the last 72 hours. PT/INR No results for input(s): "LABPROT", "INR" in the last 72 hours.  Studies/Results: DG Chest Port 1 View  Result Date: 04/23/2022 CLINICAL DATA:  Chest pain EXAM: PORTABLE CHEST 1 VIEW COMPARISON:  Chest x-ray 05/21/2021 FINDINGS: Heart is enlarged. There are increased central interstitial markings bilaterally with a small left pleural effusion. There is no pneumothorax or acute fracture. IMPRESSION: Cardiomegaly with pulmonary edema and small left pleural effusion.  Electronically Signed   By: Tina Griffiths.D.  On: 04/23/2022 17:58    Impression:   Dark stools (on iron). Acute on chronic anemia. Hemoccult positive stools. Multiple comorbidities (cardio/pulmonary) Chronic anticoagulation, Eliquis.  Plan:   CBCs, transfuse as needed. Hold Eliquis. PPI. Cardiology consult for clearance. Plan for endoscopy tomorrow, pending cardiac clearance and anesthesia availability. Risks (bleeding, infection, bowel perforation that could require surgery, sedation-related changes in cardiopulmonary systems), benefits (identification and possible treatment of source of symptoms, exclusion of certain causes of symptoms), and alternatives (watchful waiting, radiographic imaging studies, empiric medical treatment) of upper endoscopy (EGD) were explained to patient/family in detail and patient wishes to proceed.  [facility] will follow.   LOS: 0 days   Khalif Stender M  04/24/2022, 12:11 PM  Cell [phone] If no answer or after 5 PM call [phone]

## 2022-04-24 NOTE — Consult Note (Signed)
[facility] Gastroenterology Consultation Note  Referring Provider: No ref. provider found Primary Care Physician:  Burnard Bunting, MD  Reason for Consultation:  melena, anemia  HPI: Desiree Leon is a 82 y.o. female multiple comorbidities.  Several weeks of progressive weakness, dyspnea and dark stools (chronically on iron).  Had endoscopy/colonoscopy last 2012.  On Eliquis.  Some generalized abdominal pain for several months.  Found to have Hgb 6 (11 baseline).  Stools hemoccult-positive.   Past Medical History:  Diagnosis Date   Anxiety    Blood transfusion    CAP (community acquired pneumonia)    COPD (chronic obstructive pulmonary disease) ([place])    Heart murmur    History of colon polyps    Hyperlipidemia    Hypertension    MI (myocardial infarction) ([place])    Mitral valve prolapse    NSTEMI (non-ST elevated myocardial infarction) ([place])    Osteoarthritis    Osteoporosis    Pulmonary emboli ([place]) 04/12/2019   Spinal stenosis    Thyroid disease    hypothyroidism   Unstable angina ([place]) 05/08/2014   Venous insufficiency     Past Surgical History:  Procedure Laterality Date   ABDOMINAL HYSTERECTOMY  11/26/1976   APPENDECTOMY  02/14/1978   BASAL CELL CARCINOMA EXCISION  01/26/1990   COLONOSCOPY  1993, 2003, 2008, 2012   DILATION AND CURETTAGE OF UTERUS  09/1976   LEFT HEART CATHETERIZATION WITH CORONARY ANGIOGRAM N/A 04/18/2014   Procedure: LEFT HEART CATHETERIZATION WITH CORONARY ANGIOGRAM;  Surgeon: Peter M Martinique, MD;  Location: [facility] CATH LAB;  Service: Cardiovascular;  Laterality: N/A;   MASS EXCISION  10/20/2011   Procedure: MINOR EXCISION OF MASS;  Surgeon: Adin Hector, MD;  Location: [place];  Service: General;  Laterality: N/A;  Excision mass posterior neck   OOPHORECTOMY  02/14/1978   SHOULDER SURGERY  10/18/2007   basal cell   SQUAMOUS CELL CARCINOMA EXCISION     TONSILLECTOMY  1955   TUBAL LIGATION  07/08/74    Prior to Admission medications    Medication Sig Start Date End Date Taking? Authorizing Provider  acetaminophen (TYLENOL) 500 MG tablet Take 500 mg by mouth daily as needed for fever or headache.   Yes [provider]  ALPRAZolam (XANAX) 0.25 MG tablet Take 0.25 mg by mouth daily as needed for anxiety.   Yes [provider]  budesonide-formoterol (SYMBICORT) 160-4.5 MCG/ACT inhaler Inhale 2 puffs into the lungs 2 (two) times daily. 06/24/21  Yes Freddi Starr, MD  cholecalciferol (VITAMIN D) 25 MCG (1000 UNIT) tablet Take 2,000 Units by mouth daily.   Yes [provider]  COLLAGEN PO Take 10 g by mouth daily.   Yes [provider]  escitalopram (LEXAPRO) 5 MG tablet Take 5 mg by mouth every evening.    Yes [provider]  iron polysaccharides (NIFEREX) 150 MG capsule Take 150 mg by mouth daily.   Yes [provider]  Levothyroxine Sodium 137 MCG CAPS Take 137 mcg by mouth daily before breakfast.  08/25/11  Yes [provider]  methocarbamol (ROBAXIN) 500 MG tablet Take 500 mg by mouth daily as needed for muscle spasms. 09/26/19  Yes [provider]  metoprolol tartrate (LOPRESSOR) 25 MG tablet Take 25 mg by mouth 2 (two) times daily.  09/12/11  Yes [provider]  multivitamin-iron-minerals-folic acid (CENTRUM) chewable tablet Chew 1 tablet by mouth daily.   Yes [provider]  niCARdipine (CARDENE) 20 MG capsule Take 20 mg by  mouth every evening.  09/12/11  Yes [provider]  nitroGLYCERIN (NITRODUR - DOSED IN MG/24 HR) 0.4 mg/hr patch Place 0.4 mg onto the skin daily.   Yes [provider]  nitroGLYCERIN (NITROSTAT) 0.4 MG SL tablet Place 1 tablet (0.4 mg total) under the tongue every 5 (five) minutes as needed for chest pain. 05/28/21  Yes Adrian Prows, MD  oxyCODONE (OXY IR/ROXICODONE) 5 MG immediate release tablet Take 5 mg by mouth daily as needed for severe pain.   Yes [provider]  OXYGEN Inhale 2.5 L  into the lungs at bedtime.   Yes [provider]  pantoprazole (PROTONIX) 40 MG tablet Take 1 tablet (40 mg total) by mouth daily at 12 noon. Patient taking differently: Take 40 mg by mouth 2 (two) times daily. 04/21/14  Yes Barton Dubois, MD  Polyethyl Glycol-Propyl Glycol (SYSTANE) 0.4-0.3 % SOLN Place 1 drop into both eyes daily.   Yes [provider]  Probiotic Product (ALIGN) 4 MG CAPS Take 4 mg by mouth daily.   Yes [provider]  ranolazine (RANEXA) 500 MG 12 hr tablet TAKE ONE TABLET BY MOUTH EVERY TWELVE HOURS Patient taking differently: 500 mg 2 (two) times daily. 04/19/21  Yes Cantwell, Celeste C, PA-C  VYTORIN 10-20 MG per tablet Take 1 tablet by mouth daily at 6 PM.  09/18/11  Yes [provider]  apixaban (ELIQUIS) 5 MG TABS tablet Take 1 tablet (5 mg total) by mouth 2 (two) times daily. Patient not taking: Reported on 04/24/2022 06/22/21   Barb Merino, MD  glucose blood test strip Accu-Chek SmartView Test Strips  CHECK BLOOD SUGAR TWICE DAILY.    [provider]  levalbuterol Penne Lash HFA) 45 MCG/ACT inhaler Inhale 2 puffs into the lungs every 4 (four) hours as needed for wheezing. Patient not taking: Reported on 04/24/2022 06/24/21 06/24/22  Freddi Starr, MD  fluticasone [facility]) 50 MCG/ACT nasal spray Place 2 sprays into both nostrils daily. 04/04/19 09/12/19  [provider]    Current Facility-Administered Medications  Medication Dose Route Frequency Provider Last Rate Last Admin   0.9 %  sodium chloride infusion (Manually program via Guardrails IV Fluids)   Intravenous Once Etta Quill, DO       ondansetron [facility]) tablet 4 mg  4 mg Oral Q6H PRN Etta Quill, DO       Or   ondansetron [facility]) injection 4 mg  4 mg Intravenous Q6H PRN Etta Quill, DO       pantoprozole (PROTONIX) 80 mg /NS 100 mL infusion  8 mg/hr Intravenous Continuous Fransico Meadow, MD   Stopped at 04/24/22 [id number]    Allergies as  of 04/23/2022 - Review Complete 04/23/2022  Allergen Reaction Noted   Other Anaphylaxis and Cough 05/09/2014   Dilaudid [hydromorphone hcl] Nausea Only 05/09/2014   Erythromycin Other (See Comments) 04/23/2020    Family History  Problem Relation Age of Onset   Heart disease Mother    Heart disease Father    Cancer Sister        breast   Cancer Daughter        cervical and bone   Heart disease Sister    Thyroid disease Sister     Social History   Socioeconomic History   Marital status: Widowed    Spouse name: Not on file   Number of children: 3   Years of education: Not on file   Highest education level: Not on file  Occupational History   Not on file  Tobacco Use   Smoking status: Former    Types: Cigarettes    Quit date: 03/10/2005    Years since quitting: 17.1   Smokeless tobacco: Never  Vaping Use   Vaping Use: Never used  Substance and Sexual Activity   Alcohol use: No   Drug use: No   Sexual activity: Not on file  Other Topics Concern   Not on file  Social History Narrative   Not on file   Social Determinants of Health   Financial Resource Strain: Not on file  Food Insecurity: No Food Insecurity (04/23/2022)   Hunger Vital Sign    Worried About Running Out of Food in the Last Year: Never true    Ran Out of Food in the Last Year: Never true  Transportation Needs: No Transportation Needs (04/23/2022)   PRAPARE - Hydrologist (Medical): No    Lack of Transportation (Non-Medical): No  Physical Activity: Not on file  Stress: Not on file  Social Connections: Not on file  Intimate Partner Violence: Not At Risk (04/23/2022)   Humiliation, Afraid, Rape, and Kick questionnaire    Fear of Current or Ex-Partner: No    Emotionally Abused: No    Physically Abused: No    Sexually Abused: No    Review of Systems: As per HPI, all others negative  Physical Exam: Vital signs in last 24 hours: Temp:  [98 F (36.7 C)-98.9 F (37.2 C)]  98.9 F (37.2 C) (02/15 1056) Pulse Rate:  [53-70] 56 (02/15 1100) Resp:  [11-24] 14 (02/15 1100) BP: (123-149)/(41-62) 149/53 (02/15 1100) SpO2:  [96 %-100 %] 100 % (02/15 1100) Weight:  [84.6 kg] 84.6 kg (02/14 2318) Last BM Date : 04/23/22 General:   Alert,  overweight, chronically ill-appearing, pleasant and cooperative in NAD Head:  Normocephalic and atraumatic. Eyes:  Sclera clear, no icterus.   Conjunctiva pink. Ears:  Normal auditory acuity. Nose:  No deformity, discharge,  or lesions. Mouth:  No deformity or lesions.  Oropharynx pink & moist. Neck:  Supple; no masses or thyromegaly. Lungs:  No distress Abdomen:  Soft, nontender and nondistended. No masses, hepatosplenomegaly or hernias noted. Normal bowel sounds, without guarding, and without rebound.     Msk:  Symmetrical without gross deformities. Normal posture. Pulses:  Normal pulses noted. Extremities:  Without clubbing or edema. Neurologic:  Alert and  oriented x4;  grossly normal neurologically. Skin:  Intact without significant lesions or rashes. Psych:  Alert and cooperative. Normal mood and affect.   Lab Results: Recent Labs    04/23/22 1744  WBC 7.0  HGB 6.1*  HCT 21.3*  PLT 309   BMET Recent Labs    04/23/22 1744  NA 139  K 4.0  CL 105  CO2 24  GLUCOSE 130*  BUN 19  CREATININE 0.70  CALCIUM 9.1   LFT No results for input(s): "PROT", "ALBUMIN", "AST", "ALT", "ALKPHOS", "BILITOT", "BILIDIR", "IBILI" in the last 72 hours. PT/INR No results for input(s): "LABPROT", "INR" in the last 72 hours.  Studies/Results: DG Chest Port 1 View  Result Date: 04/23/2022 CLINICAL DATA:  Chest pain EXAM: PORTABLE CHEST 1 VIEW COMPARISON:  Chest x-ray 05/21/2021 FINDINGS: Heart is enlarged. There are increased central interstitial markings bilaterally with a small left pleural effusion. There is no pneumothorax or acute fracture. IMPRESSION: Cardiomegaly with pulmonary edema and small left pleural effusion.  Electronically Signed   By: Tina Griffiths.D.  On: 04/23/2022 17:58    Impression:   Dark stools (on iron). Acute on chronic anemia. Hemoccult positive stools. Multiple comorbidities (cardio/pulmonary) Chronic anticoagulation, Eliquis.  Plan:   CBCs, transfuse as needed. Hold Eliquis. PPI. Cardiology consult for clearance. Plan for endoscopy tomorrow, pending cardiac clearance and anesthesia availability. Risks (bleeding, infection, bowel perforation that could require surgery, sedation-related changes in cardiopulmonary systems), benefits (identification and possible treatment of source of symptoms, exclusion of certain causes of symptoms), and alternatives (watchful waiting, radiographic imaging studies, empiric medical treatment) of upper endoscopy (EGD) were explained to patient/family in detail and patient wishes to proceed.  [facility] will follow.   LOS: 0 days   Kirt Chew M  04/24/2022, 12:11 PM  Cell [phone] If no answer or after 5 PM call [phone]

## 2022-04-24 NOTE — Assessment & Plan Note (Signed)
Diet controlled only per patient. Q4H CBG checks for now

## 2022-04-24 NOTE — Assessment & Plan Note (Signed)
Pt takes chronic PO iron for this.

## 2022-04-24 NOTE — H&P (Signed)
History and Physical    Patient: Desiree Leon [id number] DOB: 1941/02/17 DOA: 04/23/2022 DOS: the patient was seen and examined on 04/24/2022 PCP: Burnard Bunting, MD  Patient coming from: Home  Chief Complaint:  Chief Complaint  Patient presents with   Chest Pain   Arm Pain    L   HPI: Desiree Leon is a 82 y.o. female with medical history significant of CAD, NSTEMI, COPD on home O2, PE x2 unprovoked, most recently large volume PE in March 2023 with RHS, looks like a LLE DVT at the same time.  Pt started on life long eliquis following the March 2023 PE.  Pt with chronically dark stools secondary to PO iron intake.  Previously constipated; however, has noticed that she has had more diarrhea with the dark stools recently (can't quantify how recent though).  Pt in to ED last evening with SOB, CP, L sided jaw pain.  Patient reports that over the past 4 days to started noticing that she has discomfort in her chest that radiates to both shoulders with exertion. Says that over the past several weeks has had increasing shortness of breath and fatigue.  No abd pain.   Review of Systems: As mentioned in the history of present illness. All other systems reviewed and are negative. Past Medical History:  Diagnosis Date   Anxiety    Blood transfusion    CAP (community acquired pneumonia)    COPD (chronic obstructive pulmonary disease) ([place])    Heart murmur    History of colon polyps    Hyperlipidemia    Hypertension    MI (myocardial infarction) ([place])    Mitral valve prolapse    NSTEMI (non-ST elevated myocardial infarction) ([place])    Osteoarthritis    Osteoporosis    Pulmonary emboli ([place]) 04/12/2019   Spinal stenosis    Thyroid disease    hypothyroidism   Unstable angina ([place]) 05/08/2014   Venous insufficiency    Past Surgical History:  Procedure Laterality Date   ABDOMINAL HYSTERECTOMY  11/26/1976   APPENDECTOMY  02/14/1978   BASAL CELL CARCINOMA EXCISION  01/26/1990    COLONOSCOPY  1993, 2003, 2008, 2012   DILATION AND CURETTAGE OF UTERUS  09/1976   LEFT HEART CATHETERIZATION WITH CORONARY ANGIOGRAM N/A 04/18/2014   Procedure: LEFT HEART CATHETERIZATION WITH CORONARY ANGIOGRAM;  Surgeon: Peter M Martinique, MD;  Location[facility] CATH LAB;  Service: Cardiovascular;  Laterality: N/A;   MASS EXCISION  10/20/2011   Procedure: MINOR EXCISION OF MASS;  Surgeon: Adin Hector, MD;  Location: [place];  Service: General;  Laterality: N/A;  Excision mass posterior neck   OOPHORECTOMY  02/14/1978   SHOULDER SURGERY  10/18/2007   basal cell   SQUAMOUS CELL CARCINOMA EXCISION     TONSILLECTOMY  1955   TUBAL LIGATION  07/08/74   Social History:  reports that she quit smoking about 17 years ago. Her smoking use included cigarettes. She has never used smokeless tobacco. She reports that she does not drink alcohol and does not use drugs.  Allergies  Allergen Reactions   Other Anaphylaxis and Cough    fragarances-perfumes   Dilaudid [Hydromorphone Hcl] Nausea Only   Erythromycin Other (See Comments)    Family History  Problem Relation Age of Onset   Heart disease Mother    Heart disease Father    Cancer Sister        breast   Cancer Daughter        cervical and  bone   Heart disease Sister    Thyroid disease Sister     Prior to Admission medications   Medication Sig Start Date End Date Taking? Authorizing Provider  ACCU-CHEK SMARTVIEW test strip  10/30/17   [provider]  acetaminophen (TYLENOL) 500 MG tablet Take 500 mg by mouth every 6 (six) hours as needed for fever or headache.     [provider]  ALPRAZolam Duanne Moron) 0.25 MG tablet Take 0.125 mg by mouth at bedtime as needed for anxiety.     [provider]  apixaban (ELIQUIS) 5 MG TABS tablet Take 1 tablet (5 mg total) by mouth 2 (two) times daily. 06/22/21   Barb Merino, MD  budesonide-formoterol (SYMBICORT) 160-4.5 MCG/ACT inhaler Inhale 2 puffs into the lungs 2  (two) times daily. 06/24/21   Freddi Starr, MD  cholecalciferol (VITAMIN D) 25 MCG (1000 UNIT) tablet 2 capsules.    [provider]  Collagen-Vitamin C-Biotin (COLLAGEN 1500/C PO) Take 1 capsule by mouth daily.    [provider]  Elastic Bandages & Supports (O-2 SUSPENSORY) MISC 8 hrs every night 01/08/15   [provider]  escitalopram (LEXAPRO) 5 MG tablet Take 5 mg by mouth every evening.     [provider]  glucose blood test strip Accu-Chek SmartView Test Strips  CHECK BLOOD SUGAR TWICE DAILY.    [provider]  iron polysaccharides (NIFEREX) 150 MG capsule Take 150 mg by mouth daily.    [provider]  levalbuterol Penne Lash HFA) 45 MCG/ACT inhaler Inhale 2 puffs into the lungs every 4 (four) hours as needed for wheezing. 06/24/21 06/24/22  Freddi Starr, MD  Levothyroxine Sodium 137 MCG CAPS Take 137 mcg by mouth daily before breakfast.  08/25/11   [provider]  methocarbamol (ROBAXIN) 500 MG tablet Take 500 mg by mouth every 8 (eight) hours as needed for muscle spasms. 09/26/19   [provider]  metoprolol tartrate (LOPRESSOR) 25 MG tablet Take 25 mg by mouth 2 (two) times daily.  09/12/11   [provider]  multivitamin-iron-minerals-folic acid (CENTRUM) chewable tablet Chew 1 tablet by mouth daily.    [provider]  niCARdipine (CARDENE) 20 MG capsule Take 20 mg by mouth every evening.  09/12/11   [provider]  nitroGLYCERIN (NITROSTAT) 0.4 MG SL tablet Place 1 tablet (0.4 mg total) under the tongue every 5 (five) minutes as needed for chest pain. 05/28/21   Adrian Prows, MD  OXYGEN Inhale 2 L into the lungs at bedtime.    [provider]  pantoprazole (PROTONIX) 40 MG tablet Take 1 tablet (40 mg total) by mouth daily at 12 noon. Patient taking differently: Take 40 mg by mouth 2 (two) times daily. 04/21/14   Barton Dubois, MD  Polyethyl Glycol-Propyl Glycol (SYSTANE)  0.4-0.3 % SOLN Place 1 drop into both eyes 2 (two) times daily as needed (dry eyes).    [provider]  Probiotic Product (ALIGN) 4 MG CAPS Take 4 mg by mouth daily.    [provider]  ranolazine (RANEXA) 500 MG 12 hr tablet TAKE ONE TABLET BY MOUTH EVERY TWELVE HOURS Patient taking differently: 500 mg 2 (two) times daily. 04/19/21   Cantwell, Celeste C, PA-C  VYTORIN 10-20 MG per tablet Take 1 tablet by mouth daily at 6 PM.  09/18/11   [provider]  fluticasone (FLONASE) 50 MCG/ACT nasal spray Place 2 sprays into both nostrils daily. 04/04/19 09/12/19  [provider]  Physical Exam: Vitals:   04/23/22 2318 04/24/22 0100 04/24/22 0200 04/24/22 0425  BP:  (!) 123/41 (!) 133/51 (!) 133/51  Pulse:  (!) 54 (!) 57 (!) 56  Resp:  16 17 15  $ Temp: 98.4 F (36.9 C)   98.2 F (36.8 C)  TempSrc: Oral   Oral  SpO2:  100% 99% 100%  Weight: 84.6 kg     Height: 5' 3"$  (1.6 m)      Constitutional: NAD, calm, comfortable Respiratory: clear to auscultation bilaterally, no wheezing, no crackles. Normal respiratory effort. No accessory muscle use.  Cardiovascular: Regular rate and rhythm, no murmurs / rubs / gallops. No extremity edema. 2+ pedal pulses. No carotid bruits.  Abdomen: no tenderness, no masses palpated. No hepatosplenomegaly. Bowel sounds positive.  Skin: Pale Neurologic: CN 2-12 grossly intact. Sensation intact, DTR normal. Strength 5/5 in all 4.  Psychiatric: Normal judgment and insight. Alert and oriented x 3. Normal mood.   Data Reviewed:    Trop 7, and 8 D.Dimer 0.30     Latest Ref Rng & Units 04/23/2022    5:44 PM 05/22/2021    4:43 AM 05/21/2021    9:51 PM  CBC  WBC 4.0 - 10.5 K/uL 7.0  10.7    Hemoglobin 12.0 - 15.0 g/dL 6.1  11.3  13.3   Hematocrit 36.0 - 46.0 % 21.3  34.5  39.0   Platelets 150 - 400 K/uL 309  245        Latest Ref Rng & Units 04/23/2022    5:44 PM 05/22/2021    4:43 AM 05/21/2021    9:51 PM  CMP  Glucose 70 - 99  mg/dL 130  138  150   BUN 8 - 23 mg/dL 19  14  19   $ Creatinine 0.44 - 1.00 mg/dL 0.70  0.78  0.70   Sodium 135 - 145 mmol/L 139  141  137   Potassium 3.5 - 5.1 mmol/L 4.0  4.4  4.5   Chloride 98 - 111 mmol/L 105  107  103   CO2 22 - 32 mmol/L 24  25    Calcium 8.9 - 10.3 mg/dL 9.1  8.4     Hemoccult positive.  Assessment and Plan: * Symptomatic anemia Due to GIB in setting of chronic eliquis use. Hold eliquis Transfuse 1u PRBC (being conservative with transfusion given pts self reported h/o CHF, though Dr. Einar Gip didn't seem very impressed for chronic CHF in his latest office note, normal EF in March 2023). H/H Q8H Tele monitor  GI bleed ? Melena, has chronic black stools in setting of chronic PO iron used to treat iron def anemia, however is hemoccult positive today and although she has h/o constipation with the iron she has noted that she has had more diarrhea lately. EDP spoke with GI reportedly. PPI GTT Clear liquid diet  Pulmonary embolism (HCC) Pt on life long eliquis for h/o 2 episodes of prior unprovoked PEs.  Most recent in March 2023.  Large clot burden with RHS though thankfully didn't have CHF symptoms following. Having to hold eliquis for the moment in setting of GIB of unclear acuity with positive hemoccult (ongoing) and symptomatic anemia with HGB of 6.1. Consider IVC filter if prolonged time off of AC is needed?  Looks like she had L posterior tibial vein DVTs during the March 2023 admit for PE. Dr. Einar Gip reports he does IVC filters, give him a formal consult if IVC filter is needed (if will be off of  AC for prolonged period of time).  History of iron deficiency anemia Pt takes chronic PO iron for this.  Essential hypertension Resume home BP meds when med rec is completed.  Diabetes mellitus, type II ([place]) Diet controlled only per patient. Q4H CBG checks for now  Hypothyroidism Cont synthroid when med-rec completed.      Advance Care Planning:   Code  Status: Full Code  Consults: EDP spoke with GI (not sure whom)  Also sent informal curbside to Dr. Einar Gip (give him a call for formal consult if IVC filter is needed).  Family Communication: No family in room  Severity of Illness: The appropriate patient status for this patient is OBSERVATION. Observation status is judged to be reasonable and necessary in order to provide the required intensity of service to ensure the patient's safety. The patient's presenting symptoms, physical exam findings, and initial radiographic and laboratory data in the context of their medical condition is felt to place them at decreased risk for further clinical deterioration. Furthermore, it is anticipated that the patient will be medically stable for discharge from the hospital within 2 midnights of admission.   Author: Etta Quill., DO 04/24/2022 6:18 AM  For on call review [email or web address].

## 2022-04-24 NOTE — Assessment & Plan Note (Signed)
Cont synthroid when med-rec completed.

## 2022-04-24 NOTE — Assessment & Plan Note (Addendum)
Pt on life long eliquis for h/o 2 episodes of prior unprovoked PEs.  Most recent in March 2023.  Large clot burden with RHS though thankfully didn't have CHF symptoms following. Having to hold eliquis for the moment in setting of GIB of unclear acuity with positive hemoccult (ongoing) and symptomatic anemia with HGB of 6.1. Consider IVC filter if prolonged time off of AC is needed?  Looks like she had L posterior tibial vein DVTs during the March 2023 admit for PE. Dr. Einar Gip reports he does IVC filters, give him a formal consult if IVC filter is needed (if will be off of [facility] for prolonged period of time).

## 2022-04-24 NOTE — Progress Notes (Signed)
Echocardiogram 2D Echocardiogram has been performed.  Desiree Leon 04/24/2022, 4:32 PM

## 2022-04-24 NOTE — Progress Notes (Addendum)
Subjective: Patient admitted this morning, see detailed H&P by Dr Alcario Drought 82 year old female with medical history of CAD, NSTEMI, COPD on home O2, pulmonary embolism x 2 unprovoked, most recently large-volume PE in March 2023 with right heart strain, left lower extremity DVT, started on lifelong Eliquis on March 2023 for PE.  Noticed dark stools due to intake of iron. In the ED she complained of shortness of breath or chest pain. Lab work revealed hemoglobin of 6.1, previously hemoglobin was 11.3 in March 2023.  FOBT was positive.  Gastroenterology was consulted    Vitals:   04/24/22 0200 04/24/22 0425  BP: (!) 133/51 (!) 133/51  Pulse: (!) 57 (!) 56  Resp: 17 15  Temp:  98.2 F (36.8 C)  SpO2: 99% 100%      A/P  Symptomatic anemia -Secondary to melena -Hemoglobin dropped to 6.1 -Eliquis will be held  Melena -Due to above [facility]  gastroenterology  consulted  Pulmonary embolism -2 episodes of unprovoked PE in the past -Large clot burden with right heart strain -Has been on lifelong Eliquis -Also had DVT in March 2023 -Eliquis on hold as above -If she will need IVC filter, consult Dr. Einar Gip  Iron deficiency anemia -Patient on p.o. iron  Hypertension -Blood pressure is stable -Continue to monitor -Hold antihypertensive medications in setting of GI bleed as above  Diabetes mellitus type 2 -Diet controlled  Hypothyroidism -Hold Synthroid for now -Start Synthroid once patient able to take by mouth    Desiree Leon

## 2022-04-24 NOTE — Consult Note (Signed)
CARDIOLOGY CONSULT NOTE  Patient ID: NAKAYLAH TIRPAK MRN: [id number] DOB/AGE: [birth year] 82 y.o.  Admit date: 04/23/2022 Referring Physician: [facility] hospitalist Reason for Consultation:  Preop risk stratification  HPI:   82 y.o. Caucasian female  with hypertension, hyperlipidemia, COPD, CAD, h/o recurrent PE (2021, 2023)-on eliquis for treatment, h/o iron deficiency anemia, now presented with acute blood loss anemia  Patient has always had black stools since being on iron. However, for last few days, she has been feeling tired and fatigued. She has also experienced left sided chest pain with radiation to left arm on minimal exertion, only in last few days. She does not have chest pain at baseline. Workup has ruled out ACS< showed Hb 6.3, 5 g lower than baseline. She is currently getting blood transfusion. She is likely to undergo EGD tomorrow.   Past Medical History:  Diagnosis Date   Anxiety    Blood transfusion    CAP (community acquired pneumonia)    COPD (chronic obstructive pulmonary disease) ([place])    Heart murmur    History of colon polyps    Hyperlipidemia    Hypertension    MI (myocardial infarction) ([place])    Mitral valve prolapse    NSTEMI (non-ST elevated myocardial infarction) ([place])    Osteoarthritis    Osteoporosis    Pulmonary emboli ([place]) 04/12/2019   Spinal stenosis    Thyroid disease    hypothyroidism   Unstable angina ([place]) 05/08/2014   Venous insufficiency      Past Surgical History:  Procedure Laterality Date   ABDOMINAL HYSTERECTOMY  11/26/1976   APPENDECTOMY  02/14/1978   BASAL CELL CARCINOMA EXCISION  01/26/1990   COLONOSCOPY  1993, 2003, 2008, 2012   DILATION AND CURETTAGE OF UTERUS  09/1976   LEFT HEART CATHETERIZATION WITH CORONARY ANGIOGRAM N/A 04/18/2014   Procedure: LEFT HEART CATHETERIZATION WITH CORONARY ANGIOGRAM;  Surgeon: Peter M Martinique, MD;  Location: [facility] CATH LAB;  Service: Cardiovascular;  Laterality: N/A;   MASS EXCISION  10/20/2011    Procedure: MINOR EXCISION OF MASS;  Surgeon: Adin Hector, MD;  Location: [place];  Service: General;  Laterality: N/A;  Excision mass posterior neck   OOPHORECTOMY  02/14/1978   SHOULDER SURGERY  10/18/2007   basal cell   SQUAMOUS CELL CARCINOMA EXCISION     TONSILLECTOMY  1955   TUBAL LIGATION  07/08/74      Family History  Problem Relation Age of Onset   Heart disease Mother    Heart disease Father    Cancer Sister        breast   Cancer Daughter        cervical and bone   Heart disease Sister    Thyroid disease Sister      Social History: Social History   Socioeconomic History   Marital status: Widowed    Spouse name: Not on file   Number of children: 3   Years of education: Not on file   Highest education level: Not on file  Occupational History   Not on file  Tobacco Use   Smoking status: Former    Types: Cigarettes    Quit date: 03/10/2005    Years since quitting: 17.1   Smokeless tobacco: Never  Vaping Use   Vaping Use: Never used  Substance and Sexual Activity   Alcohol use: No   Drug use: No   Sexual activity: Not on file  Other Topics Concern   Not on file  Social History  Narrative   Not on file   Social Determinants of Health   Financial Resource Strain: Not on file  Food Insecurity: No Food Insecurity (04/23/2022)   Hunger Vital Sign    Worried About Running Out of Food in the Last Year: Never true    Ran Out of Food in the Last Year: Never true  Transportation Needs: No Transportation Needs (04/23/2022)   PRAPARE - Hydrologist (Medical): No    Lack of Transportation (Non-Medical): No  Physical Activity: Not on file  Stress: Not on file  Social Connections: Not on file  Intimate Partner Violence: Not At Risk (04/23/2022)   Humiliation, Afraid, Rape, and Kick questionnaire    Fear of Current or Ex-Partner: No    Emotionally Abused: No    Physically Abused: No    Sexually Abused: No      Medications Prior to Admission  Medication Sig Dispense Refill Last Dose   acetaminophen (TYLENOL) 500 MG tablet Take 500 mg by mouth daily as needed for fever or headache.   04/22/2022   ALPRAZolam (XANAX) 0.25 MG tablet Take 0.25 mg by mouth daily as needed for anxiety.   unk   budesonide-formoterol (SYMBICORT) 160-4.5 MCG/ACT inhaler Inhale 2 puffs into the lungs 2 (two) times daily. 1 each 12 04/23/2022   cholecalciferol (VITAMIN D) 25 MCG (1000 UNIT) tablet Take 2,000 Units by mouth daily.   04/23/2022   COLLAGEN PO Take 10 g by mouth daily.   04/23/2022   escitalopram (LEXAPRO) 5 MG tablet Take 5 mg by mouth every evening.    04/23/2022   iron polysaccharides (NIFEREX) 150 MG capsule Take 150 mg by mouth daily.   04/23/2022   Levothyroxine Sodium 137 MCG CAPS Take 137 mcg by mouth daily before breakfast.    04/23/2022   methocarbamol (ROBAXIN) 500 MG tablet Take 500 mg by mouth daily as needed for muscle spasms.   unk   metoprolol tartrate (LOPRESSOR) 25 MG tablet Take 25 mg by mouth 2 (two) times daily.    04/23/2022 at 16:30   multivitamin-iron-minerals-folic acid (CENTRUM) chewable tablet Chew 1 tablet by mouth daily.   04/23/2022   niCARdipine (CARDENE) 20 MG capsule Take 20 mg by mouth every evening.    04/23/2022   nitroGLYCERIN (NITRODUR - DOSED IN MG/24 HR) 0.4 mg/hr patch Place 0.4 mg onto the skin daily.   04/23/2022   nitroGLYCERIN (NITROSTAT) 0.4 MG SL tablet Place 1 tablet (0.4 mg total) under the tongue every 5 (five) minutes as needed for chest pain. 30 tablet 0 unk   oxyCODONE (OXY IR/ROXICODONE) 5 MG immediate release tablet Take 5 mg by mouth daily as needed for severe pain.   UNK   OXYGEN Inhale 2.5 L into the lungs at bedtime.      pantoprazole (PROTONIX) 40 MG tablet Take 1 tablet (40 mg total) by mouth daily at 12 noon. (Patient taking differently: Take 40 mg by mouth 2 (two) times daily.) 30 tablet 1 04/23/2022   Polyethyl Glycol-Propyl Glycol (SYSTANE) 0.4-0.3 % SOLN Place 1  drop into both eyes daily.      Probiotic Product (ALIGN) 4 MG CAPS Take 4 mg by mouth daily.   04/23/2022   ranolazine (RANEXA) 500 MG 12 hr tablet TAKE ONE TABLET BY MOUTH EVERY TWELVE HOURS (Patient taking differently: 500 mg 2 (two) times daily.) 180 tablet 1 04/23/2022   VYTORIN 10-20 MG per tablet Take 1 tablet by mouth daily at 6 PM.  04/23/2022   apixaban (ELIQUIS) 5 MG TABS tablet Take 1 tablet (5 mg total) by mouth 2 (two) times daily. (Patient not taking: Reported on 04/24/2022) 60 tablet 1 Not Taking at 16:30   glucose blood test strip Accu-Chek SmartView Test Strips  CHECK BLOOD SUGAR TWICE DAILY.      levalbuterol (XOPENEX HFA) 45 MCG/ACT inhaler Inhale 2 puffs into the lungs every 4 (four) hours as needed for wheezing. (Patient not taking: Reported on 04/24/2022) 1 each 2 Not Taking    Review of Systems  Constitutional: Positive for malaise/fatigue.  Cardiovascular:  Positive for chest pain. Negative for dyspnea on exertion, leg swelling, palpitations and syncope.      Physical Exam: Physical Exam Vitals and nursing note reviewed.  Constitutional:      General: She is not in acute distress. Neck:     Vascular: No JVD.  Cardiovascular:     Rate and Rhythm: Normal rate and regular rhythm.     Heart sounds: Normal heart sounds. No murmur heard. Pulmonary:     Effort: Pulmonary effort is normal.     Breath sounds: Normal breath sounds. No wheezing or rales.  Musculoskeletal:     Right lower leg: No edema.     Left lower leg: No edema.        Imaging/tests reviewed and independently interpreted: Lab Results: CBC, BMP  Cardiac Studies:  Telemetry 04/24/2022: No significant arrhythmia   EKG 04/23/2022: Normal sinus rhythm Low voltage QRS Borderline ECG When compared with  Echocardiogram 05/22/2021:  1. Left ventricular ejection fraction, by estimation, is 60 to 65%. The  left ventricle has normal function. The left ventricle has no regional  wall motion  abnormalities. There is mild left ventricular hypertrophy.  Left ventricular diastolic parameters  were normal.   2. Right ventricular systolic function is mildly reduced. The right  ventricular size is normal. There is normal pulmonary artery systolic  pressure. The estimated right ventricular systolic pressure is 123456 mmHg.   3. The mitral valve is normal in structure. No evidence of mitral valve  regurgitation. No evidence of mitral stenosis.   4. The aortic valve was not well visualized. Aortic valve regurgitation  is not visualized. No aortic stenosis is present.   5. The inferior vena cava is dilated in size with >50% respiratory  variability, suggesting right atrial pressure of 8 mmHg.    Assessment & Recommendations:  65 Caucasian female  with hypertension, hyperlipidemia, COPD, CAD, h/o recurrent PE (2021, 2023)-on eliquis for treatment, now presented with acute blood loss anemia  Acute blood loss anemia: Suspected GI blood loss. Angina symptoms are due to supply demand mismatch and not ACS. Benefits of EGD/colonoscopy outweigh cardiac risks.  Will check echocardiogram, but unlikely to change above recommendation.  Hold beta blocker given ongoing GI bleed.   H/o recurrent PE: If GI recommend not resuming eliquis, we may need to consider IVC filter placement. Will follow up after GI workup.  Discussed interpretation of tests and management recommendations with the primary team     Nigel Mormon, MD Pager: [phone] Office: [phone]

## 2022-04-25 ENCOUNTER — Encounter (HOSPITAL_COMMUNITY)
Admission: EM | Disposition: A | Discharge status: Home / Self Care | Payer: Self-pay | Source: Home / Self Care | Attending: Family Medicine

## 2022-04-25 ENCOUNTER — Encounter (HOSPITAL_COMMUNITY): Payer: Self-pay | Admitting: Internal Medicine

## 2022-04-25 ENCOUNTER — Observation Stay (HOSPITAL_COMMUNITY): Payer: Medicare PPO | Admitting: Certified Registered Nurse Anesthetist

## 2022-04-25 DIAGNOSIS — D62 Acute posthemorrhagic anemia: Secondary | ICD-10-CM | POA: Diagnosis present

## 2022-04-25 DIAGNOSIS — K5731 Diverticulosis of large intestine without perforation or abscess with bleeding: Secondary | ICD-10-CM | POA: Diagnosis present

## 2022-04-25 DIAGNOSIS — I2510 Atherosclerotic heart disease of native coronary artery without angina pectoris: Secondary | ICD-10-CM | POA: Diagnosis present

## 2022-04-25 DIAGNOSIS — K921 Melena: Secondary | ICD-10-CM

## 2022-04-25 DIAGNOSIS — I252 Old myocardial infarction: Secondary | ICD-10-CM

## 2022-04-25 DIAGNOSIS — N189 Chronic kidney disease, unspecified: Secondary | ICD-10-CM

## 2022-04-25 DIAGNOSIS — Z7901 Long term (current) use of anticoagulants: Secondary | ICD-10-CM | POA: Diagnosis not present

## 2022-04-25 DIAGNOSIS — E039 Hypothyroidism, unspecified: Secondary | ICD-10-CM | POA: Diagnosis present

## 2022-04-25 DIAGNOSIS — K5520 Angiodysplasia of colon without hemorrhage: Secondary | ICD-10-CM | POA: Diagnosis not present

## 2022-04-25 DIAGNOSIS — I129 Hypertensive chronic kidney disease with stage 1 through stage 4 chronic kidney disease, or unspecified chronic kidney disease: Secondary | ICD-10-CM

## 2022-04-25 DIAGNOSIS — K648 Other hemorrhoids: Secondary | ICD-10-CM | POA: Diagnosis present

## 2022-04-25 DIAGNOSIS — K5521 Angiodysplasia of colon with hemorrhage: Secondary | ICD-10-CM | POA: Diagnosis present

## 2022-04-25 DIAGNOSIS — I7121 Aneurysm of the ascending aorta, without rupture: Secondary | ICD-10-CM | POA: Diagnosis present

## 2022-04-25 DIAGNOSIS — D649 Anemia, unspecified: Secondary | ICD-10-CM | POA: Diagnosis not present

## 2022-04-25 DIAGNOSIS — Z87891 Personal history of nicotine dependence: Secondary | ICD-10-CM | POA: Diagnosis not present

## 2022-04-25 DIAGNOSIS — I2693 Single subsegmental pulmonary embolism without acute cor pulmonale: Secondary | ICD-10-CM | POA: Diagnosis not present

## 2022-04-25 DIAGNOSIS — E1122 Type 2 diabetes mellitus with diabetic chronic kidney disease: Secondary | ICD-10-CM | POA: Diagnosis not present

## 2022-04-25 DIAGNOSIS — Z808 Family history of malignant neoplasm of other organs or systems: Secondary | ICD-10-CM | POA: Diagnosis not present

## 2022-04-25 DIAGNOSIS — Z6833 Body mass index (BMI) 33.0-33.9, adult: Secondary | ICD-10-CM | POA: Diagnosis not present

## 2022-04-25 DIAGNOSIS — J449 Chronic obstructive pulmonary disease, unspecified: Secondary | ICD-10-CM | POA: Diagnosis present

## 2022-04-25 DIAGNOSIS — K5730 Diverticulosis of large intestine without perforation or abscess without bleeding: Secondary | ICD-10-CM | POA: Diagnosis not present

## 2022-04-25 DIAGNOSIS — D123 Benign neoplasm of transverse colon: Secondary | ICD-10-CM | POA: Diagnosis not present

## 2022-04-25 DIAGNOSIS — I341 Nonrheumatic mitral (valve) prolapse: Secondary | ICD-10-CM | POA: Diagnosis present

## 2022-04-25 DIAGNOSIS — D6859 Other primary thrombophilia: Secondary | ICD-10-CM | POA: Diagnosis present

## 2022-04-25 DIAGNOSIS — Z9981 Dependence on supplemental oxygen: Secondary | ICD-10-CM | POA: Diagnosis not present

## 2022-04-25 DIAGNOSIS — K635 Polyp of colon: Secondary | ICD-10-CM | POA: Diagnosis present

## 2022-04-25 DIAGNOSIS — D509 Iron deficiency anemia, unspecified: Secondary | ICD-10-CM | POA: Diagnosis present

## 2022-04-25 DIAGNOSIS — Z803 Family history of malignant neoplasm of breast: Secondary | ICD-10-CM | POA: Diagnosis not present

## 2022-04-25 DIAGNOSIS — M810 Age-related osteoporosis without current pathological fracture: Secondary | ICD-10-CM | POA: Diagnosis present

## 2022-04-25 DIAGNOSIS — I10 Essential (primary) hypertension: Secondary | ICD-10-CM | POA: Diagnosis present

## 2022-04-25 DIAGNOSIS — E785 Hyperlipidemia, unspecified: Secondary | ICD-10-CM | POA: Diagnosis present

## 2022-04-25 DIAGNOSIS — E1151 Type 2 diabetes mellitus with diabetic peripheral angiopathy without gangrene: Secondary | ICD-10-CM | POA: Diagnosis present

## 2022-04-25 DIAGNOSIS — Z86711 Personal history of pulmonary embolism: Secondary | ICD-10-CM | POA: Diagnosis not present

## 2022-04-25 HISTORY — PX: ESOPHAGOGASTRODUODENOSCOPY (EGD) WITH PROPOFOL: SHX5813

## 2022-04-25 LAB — GLUCOSE, CAPILLARY
Glucose-Capillary: 131 mg/dL — ABNORMAL HIGH (ref 70–99)
Glucose-Capillary: 125 mg/dL — ABNORMAL HIGH (ref 70–99)
Glucose-Capillary: 122 mg/dL — ABNORMAL HIGH (ref 70–99)
Glucose-Capillary: 97 mg/dL (ref 70–99)
Glucose-Capillary: 106 mg/dL — ABNORMAL HIGH (ref 70–99)

## 2022-04-25 LAB — HEMOGLOBIN AND HEMATOCRIT, BLOOD
HCT: 23 % — ABNORMAL LOW (ref 36.0–46.0)
HCT: 24.5 % — ABNORMAL LOW (ref 36.0–46.0)
HCT: 28.5 % — ABNORMAL LOW (ref 36.0–46.0)
Hemoglobin: 7.1 g/dL — ABNORMAL LOW (ref 12.0–15.0)
Hemoglobin: 7.3 g/dL — ABNORMAL LOW (ref 12.0–15.0)
Hemoglobin: 8.5 g/dL — ABNORMAL LOW (ref 12.0–15.0)

## 2022-04-25 LAB — PREPARE RBC (CROSSMATCH)

## 2022-04-25 SURGERY — ESOPHAGOGASTRODUODENOSCOPY (EGD) WITH PROPOFOL
Anesthesia: Monitor Anesthesia Care

## 2022-04-25 MED ORDER — PROPOFOL 10 MG/ML IV BOLUS
INTRAVENOUS | Status: DC | PRN
  Administered 2022-04-25: 40 mg via INTRAVENOUS
  Administered 2022-04-25: 30 mg via INTRAVENOUS

## 2022-04-25 MED ORDER — PEG-KCL-NACL-NASULF-NA ASC-C 100 G PO SOLR: 1.0000 | Freq: Once | ORAL | Status: DC

## 2022-04-25 MED ORDER — NITROGLYCERIN 0.4 MG/HR TD PT24
0.4000 mg | MEDICATED_PATCH | Freq: Every day | TRANSDERMAL | Status: DC
  Administered 2022-04-25 – 2022-04-27 (×3): 0.4 mg via TRANSDERMAL
  Filled 2022-04-25 (×3): qty 1

## 2022-04-25 MED ORDER — MOMETASONE FURO-FORMOTEROL FUM 200-5 MCG/ACT IN AERO
2.0000 | INHALATION_SPRAY | Freq: Two times a day (BID) | RESPIRATORY_TRACT | Status: DC
  Administered 2022-04-25 – 2022-04-27 (×4): 2 via RESPIRATORY_TRACT
  Filled 2022-04-25: qty 8.8

## 2022-04-25 MED ORDER — LACTATED RINGERS IV SOLN: INTRAVENOUS | Status: DC | PRN

## 2022-04-25 MED ORDER — LEVOTHYROXINE SODIUM 137 MCG PO TABS
137.0000 ug | ORAL_TABLET | Freq: Every day | ORAL | Status: DC
  Administered 2022-04-27: 137 ug via ORAL
  Filled 2022-04-25 (×2): qty 1

## 2022-04-25 MED ORDER — BISACODYL 10 MG RE SUPP: 10.0000 mg | Freq: Once | RECTAL | Status: DC

## 2022-04-25 MED ORDER — PEG-KCL-NACL-NASULF-NA ASC-C 100 G PO SOLR
0.5000 | Freq: Once | ORAL | Status: AC
  Administered 2022-04-25: 100 g via ORAL
  Filled 2022-04-25: qty 1

## 2022-04-25 MED ORDER — SODIUM CHLORIDE 0.9% IV SOLUTION: Freq: Once | INTRAVENOUS | Status: AC

## 2022-04-25 MED ORDER — PROPOFOL 500 MG/50ML IV EMUL
INTRAVENOUS | Status: DC | PRN
  Administered 2022-04-25: 75 ug/kg/min via INTRAVENOUS

## 2022-04-25 MED ORDER — PEG-KCL-NACL-NASULF-NA ASC-C 100 G PO SOLR
0.5000 | Freq: Once | ORAL | Status: AC
  Administered 2022-04-26: 100 g via ORAL
  Filled 2022-04-25: qty 1

## 2022-04-25 MED ORDER — MOMETASONE FURO-FORMOTEROL FUM 200-5 MCG/ACT IN AERO
2.0000 | INHALATION_SPRAY | Freq: Two times a day (BID) | RESPIRATORY_TRACT | Status: DC
  Filled 2022-04-25: qty 8.8

## 2022-04-25 MED ORDER — ACETAMINOPHEN 325 MG PO TABS
650.0000 mg | ORAL_TABLET | Freq: Four times a day (QID) | ORAL | Status: DC | PRN
  Administered 2022-04-25 – 2022-04-26 (×2): 650 mg via ORAL
  Filled 2022-04-25 (×2): qty 2

## 2022-04-25 SURGICAL SUPPLY — 15 items

## 2022-04-25 NOTE — Op Note (Signed)
[facility] Patient Name: Desiree Leon Procedure Date : 04/25/2022 MRN: [id number] Attending MD: Arta Silence , MD, [id number] Date of Birth: [birth year] CSN: [id number] Age: 82 Admit Type: Inpatient Procedure:                Upper GI endoscopy Indications:              Acute post hemorrhagic anemia, Melena Providers:                Arta Silence, MD, Jeanella Cara, RN,                            Cherylynn Ridges, Technician, Wyatt Haste                            Muqtasid, CRNA Referring MD:             [facility] Medicines:                Monitored Anesthesia Care Complications:            No immediate complications. Estimated Blood Loss:     Estimated blood loss: none. Procedure:                Pre-Anesthesia Assessment:                           - Prior to the procedure, a History and Physical                            was performed, and patient medications and                            allergies were reviewed. The patient's tolerance of                            previous anesthesia was also reviewed. The risks                            and benefits of the procedure and the sedation                            options and risks were discussed with the patient.                            All questions were answered, and informed consent                            was obtained. Prior Anticoagulants: The patient has                            taken Eliquis (apixaban), last dose was 2 days                            prior to procedure. ASA Grade Assessment: III - A  patient with severe systemic disease. After                            reviewing the risks and benefits, the patient was                            deemed in satisfactory condition to undergo the                            procedure.                           After obtaining informed consent, the endoscope was                            passed under direct  vision. Throughout the                            procedure, the patient's blood pressure, pulse, and                            oxygen saturations were monitored continuously. The                            [id number] Olympus endoscope was introduced                            through the mouth, and advanced to the second part                            of duodenum. The upper GI endoscopy was                            accomplished without difficulty. The patient                            tolerated the procedure well. Scope In: Scope Out: Findings:      The examined esophagus was normal.      The entire examined stomach was normal.      The duodenal bulb, first portion of the duodenum and second portion of       the duodenum were normal. Impression:               - Normal esophagus.                           - Normal stomach.                           - Normal duodenal bulb, first portion of the                            duodenum and second portion of the duodenum.                           - No specimens  collected. Recommendation:           - Return patient to hospital ward for ongoing care.                           - Clear liquid diet today.                           - Continue present medications.                           - Perform a colonoscopy tomorrow. Procedure Code(s):        --- Professional ---                           [phone], Esophagogastroduodenoscopy, flexible,                            transoral; diagnostic, including collection of                            specimen(s) by brushing or washing, when performed                            (separate procedure) Diagnosis Code(s):        --- Professional ---                           D62, Acute posthemorrhagic anemia                           K92.1, Melena (includes Hematochezia) CPT copyright 2022 American Medical Association. All rights reserved. The codes documented in this report are preliminary and upon  coder review may  be revised to meet current compliance requirements. Arta Silence, MD 04/25/2022 9:52:57 AM This report has been signed electronically. Number of Addenda: 0

## 2022-04-25 NOTE — Progress Notes (Signed)
[facility]  PROGRESS NOTE  Desiree Leon [id number] DOB: 1940-03-30 DOA: 04/23/2022 PCP: Burnard Bunting, MD   Brief HPI:    82 year old female with medical history of CAD, NSTEMI, COPD on home O2, pulmonary embolism x 2 unprovoked, most recently large-volume PE in March 2023 with right heart strain, left lower extremity DVT, started on lifelong Eliquis on March 2023 for PE.  Noticed dark stools due to intake of iron. In the ED she complained of shortness of breath or chest pain. Lab work revealed hemoglobin of 6.1, previously hemoglobin was 11.3 in March 2023.  FOBT was positive.  Gastroenterology was consulted.   Subjective   Patient seen and examined, denies chest pain or shortness of breath.  Plan for EGD this morning.  Patient   Assessment/Plan:     Symptomatic anemia -Secondary to melena -Hemoglobin dropped to 6.1; s/p 2 units PRBC -Hemoglobin is up to 7.1 this morning -Eliquis is currently on hold   Melena -Due to above [facility]  gastroenterology  consulted -Plan for EGD today   Pulmonary embolism -2 episodes of unprovoked PE in the past -Large clot burden with right heart strain -Has been on lifelong Eliquis -Also had DVT in March 2023 -Eliquis on hold as above -She might need IVC filter in case Eliquis cannot be restarted, Dr. Cornelius Moras will perform the procedure  CAD -Patient has history of balloon angioplasty of the LAD and D2 with residual stenosis not unable to further intervention due to LAD being a small artery -She was cleared for EGD by cardiology   Iron deficiency anemia -Patient on p.o. iron   Hypertension -Blood pressure is stable -Continue to monitor -Hold antihypertensive medications in setting of GI bleed as above   Diabetes mellitus type 2 -Diet controlled   Hypothyroidism -Hold Synthroid for now -Start Synthroid once patient able to take by mouth    Medications     [MAR Hold] sodium chloride   Intravenous Once     Data  Reviewed:   CBG:  Recent Labs  Lab 04/24/22 1627 04/24/22 1952 04/24/22 2318 04/25/22 0418 04/25/22 0724  GLUCAP 84 120* 105* 131* 125*    SpO2: 100 % O2 Flow Rate (L/min): 2.5 L/min    Vitals:   04/24/22 2320 04/25/22 0416 04/25/22 0726 04/25/22 0845  BP: (!) 138/42 114/85 (!) 146/39 (!) 181/62  Pulse: 63 61 63 (!) 54  Resp: 16 16 14 16  $ Temp: 98.6 F (37 C) 98.5 F (36.9 C) 98.6 F (37 C) 97.8 F (36.6 C)  TempSrc: Oral Oral Oral Temporal  SpO2: 100% 96% 99% 100%  Weight:    84.6 kg  Height:    5' 3"$  (1.6 m)      Data Reviewed:  Basic Metabolic Panel: Recent Labs  Lab 04/23/22 1744  NA 139  K 4.0  CL 105  CO2 24  GLUCOSE 130*  BUN 19  CREATININE 0.70  CALCIUM 9.1    CBC: Recent Labs  Lab 04/23/22 1744 04/24/22 1633 04/24/22 2026 04/25/22 0414  WBC 7.0  --   --   --   HGB 6.1* 7.1* 7.5* 7.1*  HCT 21.3* 24.1* 24.9* 23.0*  MCV 75.5*  --   --   --   PLT 309  --   --   --     LFT No results for input(s): "AST", "ALT", "ALKPHOS", "BILITOT", "PROT", "ALBUMIN" in the last 168 hours.   Antibiotics: Anti-infectives (From admission, onward)    None  DVT prophylaxis: SCDs  Code Status: Full code.  Family Communication: Discussed with patient's daughter at bedside.  Patient   CONSULTS cardiology, gastroenterology   Objective    Physical Examination:   General: Appears in no acute distress Cardiovascular: S1-S2, regular, no murmur auscultated Respiratory: Clear to auscultation bilaterally Abdomen: Abdomen is soft, nontender, no organomegaly Extremities: No edema in the lower extremities Neurologic: Alert, oriented x 3, intact insight and judgment   Status is: Inpatient:             Oswald Hillock   [facility] If 7PM-7AM, please contact night-coverage at [email or web address], Office  [phone]   04/25/2022, 9:27 AM  LOS: 0 days

## 2022-04-25 NOTE — Transfer of Care (Signed)
Immediate Anesthesia Transfer of Care Note  Patient: Desiree Leon  Procedure(s) Performed: ESOPHAGOGASTRODUODENOSCOPY (EGD) WITH PROPOFOL  Patient Location: PACU  Anesthesia Type:MAC  Level of Consciousness: drowsy and patient cooperative  Airway & Oxygen Therapy: Patient Spontanous Breathing and Patient connected to nasal cannula oxygen  Post-op Assessment: Report given to RN, Post -op Vital signs reviewed and stable, and Patient moving all extremities X 4  Post vital signs: Reviewed and stable  Last Vitals:  Vitals Value Taken Time  BP 136/45 04/25/22 0941  Temp    Pulse 55 04/25/22 0942  Resp 16 04/25/22 0942  SpO2 97 % 04/25/22 0942  Vitals shown include unvalidated device data.  Last Pain:  Vitals:   04/25/22 0845  TempSrc: Temporal  PainSc: 0-No pain         Complications: No notable events documented.

## 2022-04-25 NOTE — Progress Notes (Addendum)
Mobility Specialist Progress Note:   04/25/22 1236  Mobility  Activity Ambulated with assistance in hallway  Level of Assistance Contact guard assist, steadying assist  Assistive Device None  Distance Ambulated (ft) 180 ft  Activity Response Tolerated well  $Mobility charge 1 Mobility   Pt in bed willing to participate in mobility. Complaints of L shoulder pain. Left in bed with call bell in reach and all needs met.   Gareth Eagle Vaiden Adames Mobility Specialist Please contact via [employer] or  Rehab Office at [phone]

## 2022-04-25 NOTE — Anesthesia Preprocedure Evaluation (Signed)
Anesthesia Evaluation  Patient identified by MRN, date of birth, ID band Patient awake    Reviewed: Allergy & Precautions, NPO status , Patient's Chart, lab work & pertinent test results, reviewed documented beta blocker date and time   History of Anesthesia Complications Negative for: history of anesthetic complications  Airway Mallampati: III  TM Distance: >3 FB Neck ROM: Full    Dental  (+) Dental Advisory Given, Teeth Intact   Pulmonary shortness of breath, COPD,  COPD inhaler and oxygen dependent, neg recent URI, former smoker   breath sounds clear to auscultation       Cardiovascular hypertension, Pt. on home beta blockers and Pt. on medications + angina  + Past MI  (-) CHF  Rhythm:Regular  1. Left ventricular ejection fraction, by estimation, is 65 to 70%. The  left ventricle has normal function. The left ventricle has no regional  wall motion abnormalities. Left ventricular diastolic parameters are  consistent with Grade II diastolic  dysfunction (pseudonormalization). Elevated left atrial pressure.   2. Right ventricular systolic function is normal. The right ventricular  size is normal. There is normal pulmonary artery systolic pressure.   3. Left atrial size was moderately dilated.   4. Right atrial size was mildly dilated.   5. The mitral valve is grossly normal. Mild to moderate mitral valve  regurgitation.   6. The aortic valve is grossly normal. There is mild calcification of the  aortic valve. Aortic valve regurgitation is not visualized.   7. Aortic dilatation noted. Aneurysm of the ascending aorta, measuring 40  mm.   8. The inferior vena cava is dilated in size with <50% respiratory  variability, suggesting right atrial pressure of 15 mmHg.      Neuro/Psych  PSYCHIATRIC DISORDERS Anxiety Depression    negative neurological ROS     GI/Hepatic ,GERD  ,,? GI bleed   Endo/Other  diabetesHypothyroidism   Lab Results      Component                Value               Date                      HGBA1C                   6.0 (H)             04/20/2014             Renal/GU CRFRenal diseaseLab Results      Component                Value               Date                      CREATININE               0.70                04/23/2022           Lab Results      Component                Value               Date                      K  4.0                 04/23/2022                Musculoskeletal  (+) Arthritis ,    Abdominal   Peds  Hematology  (+) Blood dyscrasia, anemia Lab Results      Component                Value               Date                      WBC                      7.0                 04/23/2022                HGB                      7.1 (L)             04/25/2022                HCT                      23.0 (L)            04/25/2022                MCV                      75.5 (L)            04/23/2022                PLT                      309                 04/23/2022            Eliquis held   Anesthesia Other Findings   Reproductive/Obstetrics                             Anesthesia Physical Anesthesia Plan  ASA: 3  Anesthesia Plan: MAC   Post-op Pain Management: Minimal or no pain anticipated   Induction: Intravenous  PONV Risk Score and Plan: 2 and Propofol infusion  Airway Management Planned: Nasal Cannula and Natural Airway  Additional Equipment: None  Intra-op Plan:   Post-operative Plan:   Informed Consent: I have reviewed the patients History and Physical, chart, labs and discussed the procedure including the risks, benefits and alternatives for the proposed anesthesia with the patient or authorized representative who has indicated his/her understanding and acceptance.     Dental advisory given  Plan Discussed with: CRNA  Anesthesia Plan Comments: (Pt drinking ice water until ~800am)         Anesthesia Quick Evaluation

## 2022-04-25 NOTE — Progress Notes (Signed)
Primary Physician/Referring:  Burnard Bunting, MD  Patient ID: Desiree Leon, female    DOB: 12/30/1940, 82 y.o.   MRN: [id number]  Chief Complaint  Patient presents with   Generalized weakness   GI bleed       HPI:    Desiree Leon  is a 82 y.o. Caucasian female with CAD h/o NSTEMI on 04/18/2014 S/P  balloon angioplasty of the LAD and D2 with 80% residual stenosis which was not amenable to further intervention due to the LAD being a small artery. and mild peripheral arterial disease, left leg sciatica and chronic back pain, hypertension and hyperlipidemia, primary hypercoagulable state with history of life-threatening pulmonary embolism in the past and on anticoagulation long-term, last PE   in March 2023 when she presented with very large bilateral pulmonary emboli leading to RV strain.  Patient now admitted with severe GI bleed with stool positive for occult blood and also patient had a blood transfusion.  This morning patient has no specific complaints.  She is being prepped for EGD this morning.  Past Medical History:  Diagnosis Date   Anxiety    Blood transfusion    CAP (community acquired pneumonia)    COPD (chronic obstructive pulmonary disease) ([place])    Heart murmur    History of colon polyps    Hyperlipidemia    Hypertension    MI (myocardial infarction) ([place])    Mitral valve prolapse    NSTEMI (non-ST elevated myocardial infarction) ([place])    Osteoarthritis    Osteoporosis    Pulmonary emboli ([place]) 04/12/2019   Spinal stenosis    Thyroid disease    hypothyroidism   Unstable angina ([place]) 05/08/2014   Venous insufficiency    Past Surgical History:  Procedure Laterality Date   ABDOMINAL HYSTERECTOMY  11/26/1976   APPENDECTOMY  02/14/1978   BASAL CELL CARCINOMA EXCISION  01/26/1990   COLONOSCOPY  1993, 2003, 2008, 2012   DILATION AND CURETTAGE OF UTERUS  09/1976   LEFT HEART CATHETERIZATION WITH CORONARY ANGIOGRAM N/A 04/18/2014   Procedure: LEFT HEART  CATHETERIZATION WITH CORONARY ANGIOGRAM;  Surgeon: Peter M Martinique, MD;  Location: [facility] CATH LAB;  Service: Cardiovascular;  Laterality: N/A;   MASS EXCISION  10/20/2011   Procedure: MINOR EXCISION OF MASS;  Surgeon: Adin Hector, MD;  Location: [place];  Service: General;  Laterality: N/A;  Excision mass posterior neck   OOPHORECTOMY  02/14/1978   SHOULDER SURGERY  10/18/2007   basal cell   [place]   TUBAL LIGATION  07/08/74   Social History   Tobacco Use   Smoking status: Former    Types: Cigarettes    Quit date: 03/10/2005    Years since quitting: 17.1   Smokeless tobacco: Never  Substance Use Topics   Alcohol use: No  Marital Status: Widowed    Objective  Blood pressure (!) 146/39, pulse 63, temperature 98.6 F (37 C), temperature source Oral, resp. rate 14, height 5' 3"$  (1.6 m), weight 84.6 kg, SpO2 99 %. Body mass index is 33.04 kg/m.      04/25/2022    7:26 AM 04/25/2022    4:16 AM 04/24/2022   11:20 PM  Vitals with BMI  Systolic [id number]  Diastolic 39 85 42  Pulse 63 61 63     Physical Exam Neck:     Vascular: No carotid bruit or JVD.  Cardiovascular:     Rate  and Rhythm: Normal rate and regular rhythm.     Pulses: Intact distal pulses.     Heart sounds: Normal heart sounds. No murmur heard.    No gallop.  Pulmonary:     Effort: Pulmonary effort is normal.     Breath sounds: Normal breath sounds.  Abdominal:     General: Bowel sounds are normal.     Palpations: Abdomen is soft.  Musculoskeletal:     Right lower leg: No edema.     Left lower leg: No edema.    Laboratory examination:   Lab Results  Component Value Date   NA 139 04/23/2022   K 4.0 04/23/2022   CO2 24 04/23/2022   GLUCOSE 130 (H) 04/23/2022   BUN 19 04/23/2022   CREATININE 0.70 04/23/2022   CALCIUM 9.1 04/23/2022   EGFR 79 03/18/2021   GFRNONAA >60 04/23/2022       Latest Ref Rng & Units 04/23/2022    5:44 PM  05/22/2021    4:43 AM 05/21/2021    9:51 PM  CMP  Glucose 70 - 99 mg/dL 130  138  150   BUN 8 - 23 mg/dL 19  14  19   $ Creatinine 0.44 - 1.00 mg/dL 0.70  0.78  0.70   Sodium 135 - 145 mmol/L 139  141  137   Potassium 3.5 - 5.1 mmol/L 4.0  4.4  4.5   Chloride 98 - 111 mmol/L 105  107  103   CO2 22 - 32 mmol/L 24  25    Calcium 8.9 - 10.3 mg/dL 9.1  8.4        Latest Ref Rng & Units 04/25/2022    4:14 AM 04/24/2022    8:26 PM 04/24/2022    4:33 PM  CBC  Hemoglobin 12.0 - 15.0 g/dL 7.1  7.5  7.1   Hematocrit 36.0 - 46.0 % 23.0  24.9  24.1    Lab Results  Component Value Date   TSH 0.834 03/18/2021    Lab Results  Component Value Date   HGBA1C 6.0 (H) 04/20/2014   Lab Results  Component Value Date   CHOL 125 04/18/2014   HDL 38 (L) 04/18/2014   LDLCALC 74 04/18/2014   TRIG 206 (H) 05/01/2020   CHOLHDL 3.3 04/18/2014    External Labs:  Cholesterol, total 149.000 m 10/08/2020 HDL 36.000 mg 10/08/2020 LDL 89.000 mg 10/08/2020 Triglycerides 119.000 m 10/08/2020  A1C 5.900 06/26/2021   Medications and allergies   Allergies  Allergen Reactions   Other Anaphylaxis and Cough    fragarances-perfumes   Dilaudid [Hydromorphone Hcl] Nausea Only   Erythromycin Other (See Comments)     Current Facility-Administered Medications:    0.9 %  sodium chloride infusion (Manually program via Guardrails IV Fluids), , Intravenous, Once, Alcario Drought, Jared M, DO   0.9 %  sodium chloride infusion, , Intravenous, Continuous, Arta Silence, MD, Last Rate: 20 mL/hr at 04/25/22 0444, New Bag at 04/25/22 0444   hydrocortisone cream 0.5 %, , Topical, TID PRN, Oswald Hillock, MD, Given at 04/25/22 0445   ondansetron (ZOFRAN) tablet 4 mg, 4 mg, Oral, Q6H PRN **OR** ondansetron (ZOFRAN) injection 4 mg, 4 mg, Intravenous, Q6H PRN, Alcario Drought, Jared M, DO   pantoprozole (PROTONIX) 80 mg /NS 100 mL infusion, 8 mg/hr, Intravenous, Continuous, Fransico Meadow, MD, Last Rate: 10 mL/hr at 04/25/22 0026, 8 mg/hr at  04/25/22 0026     Radiology:   CT angiogram chest 04/11/2019: Cardiovascular: There are 2  small peripheral pulmonary emboli in the right lower lobe. No other pulmonary emboli. Aortic atherosclerosis. RV LV ratio is normal. Mild cardiomegaly. No pericardial effusion.  Cardiac Studies:   Coronary angiogram 04/18/2014:  PTCA and suboptimal balloon angioplasty of mid LAD and D2.  Residual 80% stenosis in the distal LAD not amenable to intervention. LAD collateralized by RCA. Normal LVEF.  Exercise myoview stress 08/11/2014: 1. The resting electrocardiogram demonstrated normal sinus rhythm, normal resting conduction, no resting arrhythmias and normal rest repolarization.  The stress electrocardiogram was normal.   The patient performed treadmill exercise using a Bruce protocol, completing 5:30 minutes.The patient completed an estimated workload of 7.05 METS, 88% of the maximum predicted heart rate.  The stress test was terminated because of fatigue and THR met. 2. Myocardial perfusion imaging is normal. Overall left ventricular systolic function was normal without regional wall motion abnormalities. The left ventricular ejection fraction was 75%.  Carotid artery duplex 03/12/2020: Duplex suggests stenosis in the right internal carotid artery (1-15%). Duplex suggests stenosis in the left internal carotid artery (minimal). Antegrade right vertebral artery flow. Antegrade left vertebral artery flow. Compared to the study done on 04/10/2020, right ECA stenosis of 15 to 49% now appears to have regressed.  Echocardiogram 04/24/2022:  1. Left ventricular ejection fraction, by estimation, is 65 to 70%. The left ventricle has normal function. The left ventricle has no regional wall motion abnormalities. Left ventricular diastolic parameters are consistent with Grade II diastolic  dysfunction (pseudonormalization). Elevated left atrial pressure.  2. Right ventricular systolic function is normal. The right  ventricular size is normal. There is normal pulmonary artery systolic pressure.  3. Left atrial size was moderately dilated.  4. Right atrial size was mildly dilated.  5. The mitral valve is grossly normal. Mild to moderate mitral valve regurgitation.  6. The aortic valve is grossly normal. There is mild calcification of the aortic valve. Aortic valve regurgitation is not visualized.  7. Aortic dilatation noted. Aneurysm of the ascending aorta, measuring 40 mm.  8. The inferior vena cava is dilated in size with <50% respiratory variability, suggesting right atrial pressure of 15 mmHg.   Comparison(s): A prior study was performed on 05/22/2021. Atrial dilatation, diastolic dysfunction, MR, TR, ascending aorta dilatation are new findings.  EKG:  EKG 04/23/2022: Normal sinus rhythm at the rate of 64 bpm, low voltage complexes but otherwise normal.  No significant change from 10/14/2021.  Assessment     ICD-10-CM   1. Gastrointestinal hemorrhage, unspecified gastrointestinal hemorrhage type  K92.2     2. Symptomatic anemia  D64.9      3. Hypercoagulable state -  primary. 4. CAD of the native vessel without angina pectoris Recommendations:    Desiree Leon  is a 82 y.o. [place] female with CAD h/o NSTEMI on 04/18/2014 S/P  balloon angioplasty of the LAD and D2 with 80% residual stenosis which was not amenable to further intervention due to the LAD being a small artery. and mild peripheral arterial disease, left leg sciatica and chronic back pain, hypertension and hyperlipidemia, primary hypercoagulable state with history of life-threatening pulmonary embolism in the past and on anticoagulation long-term, last PE   in March 2023 when she presented with very large bilateral pulmonary emboli leading to RV strain.  Patient now admitted with severe GI bleed with stool positive for occult blood and also patient had a blood transfusion.  Patient has been cleared for EGD this morning, question is whether  if anticoagulation is held whether she  will need IVC filter.  This can be done in elective fashion in the outpatient basis if needed.  We could also consider DVT prophylaxis with low-dose Xarelto 2.5 mg twice daily post EGD if GI permits.  Use low-dose anticoagulation.  I have discussed with the patient regarding IVC filter placement and she is willing to proceed if needed.  For now we will be available on the sidelines, please call if questions.    Adrian Prows, MD, [facility] 04/25/2022, 7:56 AM Office: [phone] Pager: [phone]  C: [phone]

## 2022-04-25 NOTE — Anesthesia Preprocedure Evaluation (Addendum)
Anesthesia Evaluation  Patient identified by MRN, date of birth, ID band Patient awake    Reviewed: Allergy & Precautions, NPO status , Patient's Chart, lab work & pertinent test results  History of Anesthesia Complications Negative for: history of anesthetic complications  Airway Mallampati: III  TM Distance: >3 FB Neck ROM: Full    Dental  (+) Dental Advisory Given, Teeth Intact   Pulmonary shortness of breath, COPD,  COPD inhaler and oxygen dependent, neg recent URI, former smoker   breath sounds clear to auscultation       Cardiovascular hypertension, + angina  + Past MI  (-) CHF  Rhythm:Regular  1. Left ventricular ejection fraction, by estimation, is 65 to 70%. The  left ventricle has normal function. The left ventricle has no regional  wall motion abnormalities. Left ventricular diastolic parameters are  consistent with Grade II diastolic  dysfunction (pseudonormalization). Elevated left atrial pressure.   2. Right ventricular systolic function is normal. The right ventricular  size is normal. There is normal pulmonary artery systolic pressure.   3. Left atrial size was moderately dilated.   4. Right atrial size was mildly dilated.   5. The mitral valve is grossly normal. Mild to moderate mitral valve  regurgitation.   6. The aortic valve is grossly normal. There is mild calcification of the  aortic valve. Aortic valve regurgitation is not visualized.   7. Aortic dilatation noted. Aneurysm of the ascending aorta, measuring 40  mm.   8. The inferior vena cava is dilated in size with <50% respiratory  variability, suggesting right atrial pressure of 15 mmHg.      Neuro/Psych  PSYCHIATRIC DISORDERS Anxiety Depression    negative neurological ROS     GI/Hepatic ,GERD  ,,? GI bleed   Endo/Other  diabetesHypothyroidism  Lab Results      Component                Value               Date                      HGBA1C                    6.0 (H)             04/20/2014             Renal/GU CRFRenal diseaseLab Results      Component                Value               Date                      CREATININE               0.70                04/23/2022           Lab Results      Component                Value               Date                      K  4.0                 04/23/2022                Musculoskeletal  (+) Arthritis ,    Abdominal   Peds  Hematology  (+) Blood dyscrasia, anemia Lab Results      Component                Value               Date                      WBC                      7.0                 04/23/2022                HGB                      7.1 (L)             04/25/2022                HCT                      23.0 (L)            04/25/2022                MCV                      75.5 (L)            04/23/2022                PLT                      309                 04/23/2022            Eliquis held   Anesthesia Other Findings   Reproductive/Obstetrics                             Anesthesia Physical Anesthesia Plan  ASA: 3  Anesthesia Plan: MAC   Post-op Pain Management: Minimal or no pain anticipated   Induction: Intravenous  PONV Risk Score and Plan: 2 and Propofol infusion  Airway Management Planned: Nasal Cannula and Natural Airway  Additional Equipment: None  Intra-op Plan:   Post-operative Plan:   Informed Consent: I have reviewed the patients History and Physical, chart, labs and discussed the procedure including the risks, benefits and alternatives for the proposed anesthesia with the patient or authorized representative who has indicated his/her understanding and acceptance.     Dental advisory given  Plan Discussed with: CRNA  Anesthesia Plan Comments:         Anesthesia Quick Evaluation

## 2022-04-25 NOTE — Interval H&P Note (Signed)
History and Physical Interval Note:  04/25/2022 9:06 AM  Desiree Leon  has presented today for surgery, with the diagnosis of melena, anemia.  The various methods of treatment have been discussed with the patient and family. After consideration of risks, benefits and other options for treatment, the patient has consented to  Procedure(s): ESOPHAGOGASTRODUODENOSCOPY (EGD) WITH PROPOFOL (N/A) as a surgical intervention.  The patient's history has been reviewed, patient examined, no change in status, stable for surgery.  I have reviewed the patient's chart and labs.  Questions were answered to the patient's satisfaction.     Landry Dyke

## 2022-04-26 ENCOUNTER — Encounter (HOSPITAL_COMMUNITY)
Admission: EM | Disposition: A | Discharge status: Home / Self Care | Payer: Self-pay | Source: Home / Self Care | Attending: Family Medicine

## 2022-04-26 ENCOUNTER — Inpatient Hospital Stay (HOSPITAL_COMMUNITY): Payer: Medicare PPO | Admitting: Anesthesiology

## 2022-04-26 ENCOUNTER — Encounter (HOSPITAL_COMMUNITY): Payer: Self-pay | Admitting: Family Medicine

## 2022-04-26 DIAGNOSIS — N189 Chronic kidney disease, unspecified: Secondary | ICD-10-CM

## 2022-04-26 DIAGNOSIS — Z87891 Personal history of nicotine dependence: Secondary | ICD-10-CM

## 2022-04-26 DIAGNOSIS — I2693 Single subsegmental pulmonary embolism without acute cor pulmonale: Secondary | ICD-10-CM

## 2022-04-26 DIAGNOSIS — K5730 Diverticulosis of large intestine without perforation or abscess without bleeding: Secondary | ICD-10-CM

## 2022-04-26 DIAGNOSIS — D123 Benign neoplasm of transverse colon: Secondary | ICD-10-CM

## 2022-04-26 DIAGNOSIS — K5520 Angiodysplasia of colon without hemorrhage: Secondary | ICD-10-CM

## 2022-04-26 DIAGNOSIS — I129 Hypertensive chronic kidney disease with stage 1 through stage 4 chronic kidney disease, or unspecified chronic kidney disease: Secondary | ICD-10-CM

## 2022-04-26 DIAGNOSIS — K648 Other hemorrhoids: Secondary | ICD-10-CM

## 2022-04-26 DIAGNOSIS — I252 Old myocardial infarction: Secondary | ICD-10-CM

## 2022-04-26 HISTORY — PX: HEMOSTASIS CONTROL: SHX6838

## 2022-04-26 HISTORY — PX: POLYPECTOMY: SHX5525

## 2022-04-26 HISTORY — PX: HOT HEMOSTASIS: SHX5433

## 2022-04-26 HISTORY — PX: COLONOSCOPY WITH PROPOFOL: SHX5780

## 2022-04-26 LAB — BPAM RBC
Blood Product Expiration Date: 202402272359
Blood Product Expiration Date: 202402292359
ISSUE DATE / TIME: 202402151032
ISSUE DATE / TIME: 202402161352
Unit Type and Rh: 9500
Unit Type and Rh: 9500

## 2022-04-26 LAB — TYPE AND SCREEN
ABO/RH(D): O NEG
Antibody Screen: NEGATIVE
Unit division: 0
Unit division: 0

## 2022-04-26 LAB — HEMOGLOBIN AND HEMATOCRIT, BLOOD
HCT: 28.1 % — ABNORMAL LOW (ref 36.0–46.0)
HCT: 27.3 % — ABNORMAL LOW (ref 36.0–46.0)
Hemoglobin: 7.9 g/dL — ABNORMAL LOW (ref 12.0–15.0)
Hemoglobin: 8 g/dL — ABNORMAL LOW (ref 12.0–15.0)

## 2022-04-26 LAB — COMPREHENSIVE METABOLIC PANEL
ALT: 5 U/L (ref 0–44)
AST: 61 U/L — ABNORMAL HIGH (ref 15–41)
Albumin: 3.2 g/dL — ABNORMAL LOW (ref 3.5–5.0)
Alkaline Phosphatase: 67 U/L (ref 38–126)
Anion gap: 12 (ref 5–15)
BUN: 6 mg/dL — ABNORMAL LOW (ref 8–23)
CO2: 20 mmol/L — ABNORMAL LOW (ref 22–32)
Calcium: 8.4 mg/dL — ABNORMAL LOW (ref 8.9–10.3)
Chloride: 105 mmol/L (ref 98–111)
Creatinine, Ser: 0.81 mg/dL (ref 0.44–1.00)
GFR, Estimated: >60 mL/min (ref >60)
Glucose, Bld: 95 mg/dL (ref 70–99)
Potassium: 5 mmol/L (ref 3.5–5.1)
Sodium: 137 mmol/L (ref 135–145)
Total Bilirubin: 1.7 mg/dL — ABNORMAL HIGH (ref 0.3–1.2)
Total Protein: 5 g/dL — ABNORMAL LOW (ref 6.5–8.1)

## 2022-04-26 LAB — GLUCOSE, CAPILLARY
Glucose-Capillary: 104 mg/dL — ABNORMAL HIGH (ref 70–99)
Glucose-Capillary: 112 mg/dL — ABNORMAL HIGH (ref 70–99)
Glucose-Capillary: 127 mg/dL — ABNORMAL HIGH (ref 70–99)
Glucose-Capillary: 147 mg/dL — ABNORMAL HIGH (ref 70–99)
Glucose-Capillary: 96 mg/dL (ref 70–99)

## 2022-04-26 SURGERY — COLONOSCOPY WITH PROPOFOL
Anesthesia: Monitor Anesthesia Care

## 2022-04-26 MED ORDER — LIDOCAINE 2% (20 MG/ML) 5 ML SYRINGE
INTRAMUSCULAR | Status: DC | PRN
  Administered 2022-04-26: 40 mg via INTRAVENOUS

## 2022-04-26 MED ORDER — PROPOFOL 500 MG/50ML IV EMUL
INTRAVENOUS | Status: DC | PRN
  Administered 2022-04-26 (×2): 10 mg via INTRAVENOUS
  Administered 2022-04-26: 100 ug/kg/min via INTRAVENOUS
  Administered 2022-04-26: 20 mg via INTRAVENOUS
  Administered 2022-04-26: 10 mg via INTRAVENOUS

## 2022-04-26 MED ORDER — LACTATED RINGERS IV SOLN
INTRAVENOUS | Status: AC | PRN
  Administered 2022-04-26: 1000 mL via INTRAVENOUS

## 2022-04-26 SURGICAL SUPPLY — 22 items

## 2022-04-26 NOTE — Anesthesia Postprocedure Evaluation (Signed)
Anesthesia Post Note  Patient: Desiree Leon  Procedure(s) Performed: COLONOSCOPY WITH PROPOFOL POLYPECTOMY HOT HEMOSTASIS (ARGON PLASMA COAGULATION/BICAP) HEMOSTASIS CONTROL     Anesthesia Type: MAC Anesthetic complications: no   No notable events documented.  Last Vitals:  Vitals:   04/26/22 1245 04/26/22 1256  BP: (!) 171/68 (!) 179/56  Pulse: (!) 57 60  Resp: 15 19  Temp:  36.7 C  SpO2: 100% 100%    Last Pain:  Vitals:   04/26/22 1256  TempSrc: Oral  PainSc:                  [name]

## 2022-04-26 NOTE — Progress Notes (Signed)
[facility]  PROGRESS NOTE  Desiree Leon [id number] DOB: [birth year] DOA: 04/23/2022 PCP: Burnard Bunting, MD   Brief HPI:    82 year old female with medical history of CAD, NSTEMI, COPD on home O2, pulmonary embolism x 2 unprovoked, most recently large-volume PE in March 2023 with right heart strain, left lower extremity DVT, started on lifelong Eliquis on March 2023 for PE.  Noticed dark stools due to intake of iron. In the ED she complained of shortness of breath or chest pain. Lab work revealed hemoglobin of 6.1, previously hemoglobin was 11.3 in March 2023.  FOBT was positive.  Gastroenterology was consulted.   Subjective   Patient seen and examined, seen after colonoscopy.  Colonoscopy showed multiple large AVM with contact oozing.  Treated with APC and Pura stat.  Also had mild transverse colon polyp which was removed with cold snare.  Also showed diverticulosis and hemorrhoids.   Assessment/Plan:     Symptomatic anemia -Secondary to GI bleed -Hemoglobin dropped to 6.1; s/p 2 units PRBC -Hemoglobin is up to 7.9 this morning -Eliquis is currently on hold -GI recommends to restart Eliquis in 48 hours from today   GI bleed [facility] gastroenterology was consulted -Underwent EGD which was unremarkable -Colonoscopy showed large AVM treated with APC and PuraStat, diverticulosis -Follow CBC in a.m.   Pulmonary embolism -2 episodes of unprovoked PE in the past -Large clot burden with right heart strain -Has been on lifelong Eliquis -Also had DVT in March 2023 -Eliquis on hold as above; will be restarted on 04/28/2022 -She might need IVC filter if patient again starts to bleed with Eliquis -Dr. Einar Gip will do procedure if needed  CAD -Patient has history of balloon angioplasty of the LAD and D2 with residual stenosis not unable to further intervention due to LAD being a small artery -She was cleared for EGD/colonoscopy by cardiology -Nitroglycerin transdermal  restarted   Iron deficiency anemia -Patient on p.o. iron   Hypertension -Blood pressure is stable -Continue to monitor -Hold antihypertensive medications in setting of GI bleed as above   Diabetes mellitus type 2 -Diet controlled   Hypothyroidism -Synthroid restarted    Medications     sodium chloride   Intravenous Once   bisacodyl  10 mg Rectal Once   levothyroxine  137 mcg Oral QAC breakfast   mometasone-formoterol  2 puff Inhalation BID   nitroGLYCERIN  0.4 mg Transdermal Daily     Data Reviewed:   CBG:  Recent Labs  Lab 04/25/22 1605 04/25/22 1925 04/26/22 0002 04/26/22 0305 04/26/22 0736  GLUCAP 97 106* 104* 112* 96    SpO2: 99 % O2 Flow Rate (L/min): 2.5 L/min    Vitals:   04/25/22 2341 04/26/22 0256 04/26/22 0737 04/26/22 0749  BP: (!) 135/45 (!) 133/46 (!) 132/52   Pulse: 61 60 60   Resp: 16 19 15   $ Temp: 97.8 F (36.6 C) 97.9 F (36.6 C) 97.9 F (36.6 C)   TempSrc: Oral Oral Oral   SpO2: 99% 97% 99% 99%  Weight:      Height:          Data Reviewed:  Basic Metabolic Panel: Recent Labs  Lab 04/23/22 1744 04/26/22 0410  NA 139 137  K 4.0 5.0  CL 105 105  CO2 24 20*  GLUCOSE 130* 95  BUN 19 6*  CREATININE 0.70 0.81  CALCIUM 9.1 8.4*    CBC: Recent Labs  Lab 04/23/22 1744 04/24/22 1633 04/24/22 2026 04/25/22 0414 04/25/22 1246 04/25/22  2005 04/26/22 0410  WBC 7.0  --   --   --   --   --   --   HGB 6.1*   < > 7.5* 7.1* 7.3* 8.5* 7.9*  HCT 21.3*   < > 24.9* 23.0* 24.5* 28.5* 28.1*  MCV 75.5*  --   --   --   --   --   --   PLT 309  --   --   --   --   --   --    < > = values in this interval not displayed.    LFT Recent Labs  Lab 04/26/22 0410  AST 61*  ALT 5  ALKPHOS 67  BILITOT 1.7*  PROT 5.0*  ALBUMIN 3.2*     Antibiotics: Anti-infectives (From admission, onward)    None        DVT prophylaxis: SCDs  Code Status: Full code.  Family Communication: Discussed with patient's daughter at bedside.   Patient   CONSULTS cardiology, gastroenterology   Objective    Physical Examination:   Appears in no acute distress S1-S2, regular, no murmur auscultated Lungs clear to auscultation bilaterally Abdomen is soft, nontender, no organomegaly   Status is: Inpatient:             Oswald Hillock   [facility] If 7PM-7AM, please contact night-coverage at [email or web address], Office  [phone]   04/26/2022, 8:13 AM  LOS: 1 day

## 2022-04-26 NOTE — Brief Op Note (Signed)
04/23/2022 - 04/26/2022  12:34 PM  PATIENT:  Desiree Leon  82 y.o. female  PRE-OPERATIVE DIAGNOSIS:  melena, anemia, negative endoscopy  POST-OPERATIVE DIAGNOSIS:  cold snare polypectomy: transverse x1 cecum AVMs x2 APCd, pura stat applied  PROCEDURE:  Procedure(s): COLONOSCOPY WITH PROPOFOL (N/A) POLYPECTOMY HOT HEMOSTASIS (ARGON PLASMA COAGULATION/BICAP) (N/A) HEMOSTASIS CONTROL  SURGEON:  Surgeon(s) and Role:    * Airyonna Franklyn, MD - Primary  Recommendations ----------------------- -Multiple large AVMs with contact oozing.  Treated with APC and PuraStat. -Mild transverse colon polyp.  Removed with cold snare. -Diverticulosis and hemorrhoids.  Normal TI.  Recommendations ------------------------- -soft diet and advance as tolerated. -Okay to resume Eliquis after 48 hours if no further bleeding. -Monitor H&H.  No further inpatient GI workup planned.  GI will sign off.  Call us back if needed.  Otis Brace MD, [place] 04/26/2022, 12:35 PM  Contact #  [phone]

## 2022-04-26 NOTE — Op Note (Signed)
[facility] Patient Name: Desiree Leon Procedure Date : 04/26/2022 MRN: [id number] Attending MD: Otis Brace , MD, [id number] Date of Birth: December 08, 1940 CSN: [id number] Age: 82 Admit Type: Inpatient Procedure:                Colonoscopy Indications:              Gastrointestinal bleeding Providers:                Otis Brace, MD, Doristine Johns, RN, Brien Mates, Technician Referring MD:              Medicines:                Sedation Administered by an Anesthesia Professional Complications:            No immediate complications. Estimated Blood Loss:     Estimated blood loss was minimal. Procedure:                Pre-Anesthesia Assessment:                           - Prior to the procedure, a History and Physical                            was performed, and patient medications and                            allergies were reviewed. The patient's tolerance of                            previous anesthesia was also reviewed. The risks                            and benefits of the procedure and the sedation                            options and risks were discussed with the patient.                            All questions were answered, and informed consent                            was obtained. Prior Anticoagulants: The patient has                            taken Eliquis (apixaban), last dose was 3 days                            prior to procedure. ASA Grade Assessment: III - A                            patient with severe systemic disease. After  reviewing the risks and benefits, the patient was                            deemed in satisfactory condition to undergo the                            procedure.                           After obtaining informed consent, the colonoscope                            was passed under direct vision. Throughout the                            procedure, the  patient's blood pressure, pulse, and                            oxygen saturations were monitored continuously. The                            PCF-190TL [id number] Olympus colonoscope was                            introduced through the anus and advanced to the the                            terminal ileum, with identification of the                            appendiceal orifice and IC valve. The colonoscopy                            was performed with moderate difficulty due to a                            redundant colon. Successful completion of the                            procedure was aided by applying abdominal pressure.                            The patient tolerated the procedure well. The                            quality of the bowel preparation was fair. The                            terminal ileum, ileocecal valve, appendiceal                            orifice, and rectum were photographed. Scope In: 12:02:50 PM Scope Out: 12:21:38 PM Scope Withdrawal Time: 0 hours 14 minutes 15 seconds  Total Procedure Duration: 0 hours 18 minutes 48  seconds  Findings:      Hemorrhoids were found on perianal exam.      The terminal ileum appeared normal.      Four large patchy angioectasias with bleeding on contact were found in       the cecum. Fulguration to ablate the lesion by argon plasma was       successful. PuraStat was also used.      An 8 mm polyp was found in the transverse colon. The polyp was sessile.       The polyp was removed with a cold snare. Resection and retrieval were       complete.      A few small-mouthed diverticula were found in the sigmoid colon.      Internal hemorrhoids were found during retroflexion. The hemorrhoids       were large. Impression:               - Preparation of the colon was fair.                           - Hemorrhoids found on perianal exam.                           - The examined portion of the ileum was normal.                            - Four colonic angioectasias. Treated with argon                            plasma coagulation (APC). PuraStat was also used.                           - One 8 mm polyp in the transverse colon, removed                            with a cold snare. Resected and retrieved.                           - Diverticulosis in the sigmoid colon.                           - Internal hemorrhoids. Recommendation:           - Return patient to hospital ward for ongoing care.                           - Soft diet.                           - Continue present medications.                           - Await pathology results.                           - Repeat colonoscopy is not recommended due to  current age (40 years or older) for surveillance.                           - Return to my office PRN. Procedure Code(s):        --- Professional ---                           [phone], 59, Colonoscopy, flexible; with control of                            bleeding, any method                           45385, Colonoscopy, flexible; with removal of                            tumor(s), polyp(s), or other lesion(s) by snare                            technique Diagnosis Code(s):        --- Professional ---                           K64.8, Other hemorrhoids                           K55.20, Angiodysplasia of colon without hemorrhage                           D12.3, Benign neoplasm of transverse colon (hepatic                            flexure or splenic flexure)                           K92.2, Gastrointestinal hemorrhage, unspecified                           K57.30, Diverticulosis of large intestine without                            perforation or abscess without bleeding CPT copyright 2022 American [employer] Association. All rights reserved. The codes documented in this report are preliminary and upon coder review may  be revised to meet current compliance requirements. Otis Brace, MD Otis Brace, MD 04/26/2022 12:34:02 PM Number of Addenda: 0

## 2022-04-26 NOTE — Progress Notes (Signed)
Desiree Leon is a 82 y.o. female has presented with GI bleed.  The various methods of treatment have been discussed with the patient and family. After consideration of risks, benefits and other options for treatment, the patient has consented to  Procedure(s): Colonoscopy as a surgical intervention .  The patient's history has been reviewed, patient examined, no change in status, stable for surgery.  I have reviewed the patient's chart and labs.  Questions were answered to the patient's satisfaction.    Risks (bleeding, infection, bowel perforation that could require surgery, sedation-related changes in cardiopulmonary systems), benefits (identification and possible treatment of source of symptoms, exclusion of certain causes of symptoms), and alternatives (watchful waiting, radiographic imaging studies, empiric medical treatment)  were explained to patient/family in detail and patient wishes to proceed.   Otis Brace MD, [place] 04/26/2022, 8:42 AM  Contact #  [phone]

## 2022-04-26 NOTE — Anesthesia Postprocedure Evaluation (Signed)
Anesthesia Post Note  Patient: Desiree Leon  Procedure(s) Performed: ESOPHAGOGASTRODUODENOSCOPY (EGD) WITH PROPOFOL     Patient location during evaluation: Endoscopy Anesthesia Type: MAC Level of consciousness: awake and alert Pain management: pain level controlled Vital Signs Assessment: post-procedure vital signs reviewed and stable Respiratory status: spontaneous breathing, nonlabored ventilation and respiratory function stable Cardiovascular status: stable Postop Assessment: no apparent nausea or vomiting Anesthetic complications: no   No notable events documented.  Last Vitals:  Vitals:   04/26/22 1245 04/26/22 1256  BP: (!) 171/68 (!) 179/56  Pulse: (!) 57 60  Resp: 15 19  Temp:  36.7 C  SpO2: 100% 100%    Last Pain:  Vitals:   04/26/22 1256  TempSrc: Oral  PainSc:                  [name]

## 2022-04-26 NOTE — Plan of Care (Signed)

## 2022-04-26 NOTE — Transfer of Care (Signed)
Immediate Anesthesia Transfer of Care Note  Patient: Desiree Leon  Procedure(s) Performed: COLONOSCOPY WITH PROPOFOL POLYPECTOMY HOT HEMOSTASIS (ARGON PLASMA COAGULATION/BICAP) HEMOSTASIS CONTROL  Patient Location: PACU  Anesthesia Type:MAC  Level of Consciousness: awake and alert   Airway & Oxygen Therapy: Patient Spontanous Breathing and Patient connected to nasal cannula oxygen  Post-op Assessment: Report given to RN and Post -op Vital signs reviewed and stable  Post vital signs: Reviewed and stable  Last Vitals:  Vitals Value Taken Time  BP 161/66 04/26/22 1230  Temp    Pulse 66 04/26/22 1231  Resp 16 04/26/22 1231  SpO2 99 % 04/26/22 1231  Vitals shown include unvalidated device data.  Last Pain:  Vitals:   04/26/22 0820  TempSrc: Temporal  PainSc: 0-No pain         Complications: No notable events documented.

## 2022-04-27 ENCOUNTER — Encounter (HOSPITAL_COMMUNITY): Payer: Self-pay | Admitting: Gastroenterology

## 2022-04-27 LAB — CBC
HCT: 25.4 % — ABNORMAL LOW (ref 36.0–46.0)
Hemoglobin: 7.6 g/dL — ABNORMAL LOW (ref 12.0–15.0)
MCH: 23.5 pg — ABNORMAL LOW (ref 26.0–34.0)
MCHC: 29.9 g/dL — ABNORMAL LOW (ref 30.0–36.0)
MCV: 78.6 fL — ABNORMAL LOW (ref 80.0–100.0)
Platelets: 243 10*3/uL (ref 150–400)
RBC: 3.23 MIL/uL — ABNORMAL LOW (ref 3.87–5.11)
RDW: 19 % — ABNORMAL HIGH (ref 11.5–15.5)
WBC: 7.9 10*3/uL (ref 4.0–10.5)
nRBC: 0 % (ref 0.0–0.2)

## 2022-04-27 LAB — GLUCOSE, CAPILLARY
Glucose-Capillary: 128 mg/dL — ABNORMAL HIGH (ref 70–99)
Glucose-Capillary: 140 mg/dL — ABNORMAL HIGH (ref 70–99)
Glucose-Capillary: 179 mg/dL — ABNORMAL HIGH (ref 70–99)

## 2022-04-27 NOTE — Discharge Summary (Signed)
Physician Discharge Summary   Patient: Desiree Leon MRN: [id number] DOB: March 30, 1940  Admit date:     04/23/2022  Discharge date: 04/27/22  Discharge Physician: Oswald Hillock   PCP: Burnard Bunting, MD   Recommendations at discharge:   Follow-up PCP in 1 week; check hemoglobin in 1 week  Discharge Diagnoses: Principal Problem:   Symptomatic anemia Active Problems:   GI bleed   Pulmonary embolism (HCC)   Hypothyroidism   Diabetes mellitus, type II ([place])   Essential hypertension   History of iron deficiency anemia  Resolved Problems:   * No resolved hospital problems. Desiree Leon: 82 year old female with medical history of CAD, NSTEMI, COPD on home O2, pulmonary embolism x 2 unprovoked, most recently large-volume PE in March 2023 with right heart strain, left lower extremity DVT, started on lifelong Eliquis on March 2023 for PE.  Noticed dark stools due to intake of iron. In the ED she complained of shortness of breath or chest pain. Lab work revealed hemoglobin of 6.1, previously hemoglobin was 11.3 in March 2023.  FOBT was positive.  Gastroenterology was consulted.    Assessment and Plan:   Symptomatic anemia -Secondary to GI bleed -Hemoglobin dropped to 6.1; s/p 2 units PRBC -Hemoglobin is up to 7.6 this morning -Eliquis is currently on hold -GI recommends to restart Eliquis in 48 hours  -Can restart Eliquis on 04/28/2022 at 5 PM    GI bleed [facility] gastroenterology was consulted -Underwent EGD which was unremarkable -Colonoscopy showed large AVM treated with APC and PuraStat, diverticulosis -Hemoglobin is stable at this point at 7.6 -Can restart Eliquis tomorrow at 5 PM -Follow-up PCP in 1 week to check hemoglobin   Pulmonary embolism -2 episodes of unprovoked PE in the past -Large clot burden with right heart strain -Has been on lifelong Eliquis -Also had DVT in March 2023 -Eliquis on hold as above; will be restarted on 04/28/2022 -She might need IVC  filter if patient again starts to bleed with Eliquis -Dr. Einar Gip will do procedure if needed -Eliquis restarted as above   CAD -Patient has history of balloon angioplasty of the LAD and D2 with residual stenosis not unable to further intervention due to LAD being a small artery -She was cleared for EGD/colonoscopy by cardiology -Nitroglycerin t   Iron deficiency anemia -Patient on p.o. iron   Hypertension -Continue home regimen   Diabetes mellitus type 2 -Diet controlled   Hypothyroidism -Synthroid restarted         Consultants: Gastroenterology Procedures performed:   Disposition: Home Diet recommendation:  Discharge Diet Orders (From admission, onward)     Start     Ordered   04/27/22 0000  Diet - low sodium heart healthy        04/27/22 0908           Regular diet DISCHARGE MEDICATION: Allergies as of 04/27/2022       Reactions   Other Anaphylaxis, Cough   fragarances-perfumes   Dilaudid [hydromorphone Hcl] Nausea Only   Erythromycin Other (See Comments)        Medication List     TAKE these medications    acetaminophen 500 MG tablet Commonly known as: TYLENOL Take 500 mg by mouth daily as needed for fever or headache.   Align 4 MG Caps Take 4 mg by mouth daily.   ALPRAZolam 0.25 MG tablet Commonly known as: XANAX Take 0.25 mg by mouth daily as needed for anxiety.   apixaban 5 MG Tabs tablet  Commonly known as: ELIQUIS Take 1 tablet (5 mg total) by mouth 2 (two) times daily.   budesonide-formoterol 160-4.5 MCG/ACT inhaler Commonly known as: Symbicort Inhale 2 puffs into the lungs 2 (two) times daily.   cholecalciferol 25 MCG (1000 UT) tablet Generic drug: Cholecalciferol Take 2,000 Units by mouth daily.   COLLAGEN PO Take 10 g by mouth daily.   escitalopram 5 MG tablet Commonly known as: LEXAPRO Take 5 mg by mouth every evening.   glucose blood test strip Accu-Chek SmartView Test Strips  CHECK BLOOD SUGAR TWICE DAILY.   iron  polysaccharides 150 MG capsule Commonly known as: NIFEREX Take 150 mg by mouth daily.   levalbuterol 45 MCG/ACT inhaler Commonly known as: XOPENEX HFA Inhale 2 puffs into the lungs every 4 (four) hours as needed for wheezing.   Levothyroxine Sodium 137 MCG Caps Take 137 mcg by mouth daily before breakfast.   methocarbamol 500 MG tablet Commonly known as: ROBAXIN Take 500 mg by mouth daily as needed for muscle spasms.   metoprolol tartrate 25 MG tablet Commonly known as: LOPRESSOR Take 25 mg by mouth 2 (two) times daily.   multivitamin-iron-minerals-folic acid chewable tablet Chew 1 tablet by mouth daily.   niCARdipine 20 MG capsule Commonly known as: CARDENE Take 20 mg by mouth every evening.   nitroGLYCERIN 0.4 mg/hr patch Commonly known as: NITRODUR - Dosed in mg/24 hr Place 0.4 mg onto the skin daily.   nitroGLYCERIN 0.4 MG SL tablet Commonly known as: NITROSTAT Place 1 tablet (0.4 mg total) under the tongue every 5 (five) minutes as needed for chest pain.   oxyCODONE 5 MG immediate release tablet Commonly known as: Oxy IR/ROXICODONE Take 5 mg by mouth daily as needed for severe pain.   OXYGEN Inhale 2.5 L into the lungs at bedtime.   pantoprazole 40 MG tablet Commonly known as: PROTONIX Take 1 tablet (40 mg total) by mouth daily at 12 noon. What changed: when to take this   ranolazine 500 MG 12 hr tablet Commonly known as: RANEXA TAKE ONE TABLET BY MOUTH EVERY TWELVE HOURS What changed:  how to take this when to take this   Systane 0.4-0.3 % Soln Generic drug: Polyethyl Glycol-Propyl Glycol Place 1 drop into both eyes daily.   Vytorin 10-20 MG tablet Generic drug: ezetimibe-simvastatin Take 1 tablet by mouth daily at 6 PM.        Follow-up Information     Burnard Bunting, MD Follow up in 1 week(s).   Specialty: Internal Medicine Why: Follow-up PCP in 1 week to check hemoglobin in 1 week Contact information: [place]  [id number] [phone]                Discharge Exam: Danley Danker Weights   04/23/22 2318 04/25/22 0845 04/26/22 0820  Weight: 84.6 kg 84.6 kg 84.6 kg   General-appears in no acute distress Heart-S1-S2, regular, no murmur auscultated Lungs-clear to auscultation bilaterally, no wheezing or crackles auscultated Abdomen-soft, nontender, no organomegaly Extremities-no edema in the lower extremities Neuro-alert, oriented x3, no focal deficit noted  Condition at discharge: good  The results of significant diagnostics from this hospitalization (including imaging, microbiology, ancillary and laboratory) are listed below for reference.   Imaging Studies: ECHOCARDIOGRAM COMPLETE  Result Date: 04/24/2022    ECHOCARDIOGRAM REPORT   Patient Name:   ALENI BENTSEN Date of Exam: 04/24/2022 Medical Rec #:  [id number]      Height:       63.0 in Accession #:  [id number]     Weight:       186.5 lb Date of Birth:  06-Jan-1941      BSA:          1.877 m Patient Age:    82 years       BP:           134/52 mmHg Patient Gender: F              HR:           55 bpm. Exam Location:  Inpatient Procedure: 2D Echo, Cardiac Doppler and Color Doppler Indications:     Chest Pain R07.9  History:         Patient has prior history of Echocardiogram examinations, most                  recent 05/22/2021. Previous Myocardial Infarction, Mitral Valve                  Disease, Signs/Symptoms:Murmur; Risk Factors:Diabetes,                  Hypertension, Former Smoker and Dyslipidemia.  Sonographer:     Greer Pickerel Referring Phys:  [id number] [facility] J PATWARDHAN Diagnosing Phys: Vernell Leep MD  Sonographer Comments: Image acquisition challenging due to patient body habitus and Image acquisition challenging due to respiratory motion. IMPRESSIONS  1. Left ventricular ejection fraction, by estimation, is 65 to 70%. The left ventricle has normal function. The left ventricle has no regional wall motion abnormalities. Left ventricular  diastolic parameters are consistent with Grade II diastolic dysfunction (pseudonormalization). Elevated left atrial pressure.  2. Right ventricular systolic function is normal. The right ventricular size is normal. There is normal pulmonary artery systolic pressure.  3. Left atrial size was moderately dilated.  4. Right atrial size was mildly dilated.  5. The mitral valve is grossly normal. Mild to moderate mitral valve regurgitation.  6. The aortic valve is grossly normal. There is mild calcification of the aortic valve. Aortic valve regurgitation is not visualized.  7. Aortic dilatation noted. Aneurysm of the ascending aorta, measuring 40 mm.  8. The inferior vena cava is dilated in size with <50% respiratory variability, suggesting right atrial pressure of 15 mmHg. Comparison(s): A prior study was performed on 05/22/2021. Atrial dilatation, diastolic dysfunction, MR, TR, ascending aorta dilatation are new findings. FINDINGS  Left Ventricle: Left ventricular ejection fraction, by estimation, is 65 to 70%. The left ventricle has normal function. The left ventricle has no regional wall motion abnormalities. The left ventricular internal cavity size was normal in size. There is  no left ventricular hypertrophy. Left ventricular diastolic parameters are consistent with Grade II diastolic dysfunction (pseudonormalization). Elevated left atrial pressure. Right Ventricle: The right ventricular size is normal. No increase in right ventricular wall thickness. Right ventricular systolic function is normal. There is normal pulmonary artery systolic pressure. The tricuspid regurgitant velocity is 2.28 m/s, and  with an assumed right atrial pressure of 8 mmHg, the estimated right ventricular systolic pressure is 0000000 mmHg. Left Atrium: Left atrial size was moderately dilated. Right Atrium: Right atrial size was mildly dilated. Pericardium: There is no evidence of pericardial effusion. Mitral Valve: The mitral valve is grossly  normal. Mild to moderate mitral valve regurgitation. Tricuspid Valve: The tricuspid valve is normal in structure. Tricuspid valve regurgitation is mild . No evidence of tricuspid stenosis. Aortic Valve: The aortic valve is grossly normal. There is mild calcification of the aortic valve. Aortic  valve regurgitation is not visualized. Pulmonic Valve: The pulmonic valve was normal in structure. Pulmonic valve regurgitation is not visualized. No evidence of pulmonic stenosis. Aorta: Aortic dilatation noted. There is an aneurysm involving the ascending aorta measuring 40 mm. Venous: The inferior vena cava is dilated in size with less than 50% respiratory variability, suggesting right atrial pressure of 15 mmHg. IAS/Shunts: The interatrial septum was not well visualized.  LEFT VENTRICLE PLAX 2D LVIDd:         5.30 cm   Diastology LVIDs:         3.40 cm   LV e' medial:    6.32 cm/s LV PW:         1.00 cm   LV E/e' medial:  21.2 LV IVS:        1.10 cm   LV e' lateral:   8.84 cm/s LVOT diam:     1.80 cm   LV E/e' lateral: 15.2 LV SV:         85 LV SV Index:   45 LVOT Area:     2.54 cm  RIGHT VENTRICLE RV S prime:     12.00 cm/s TAPSE (M-mode): 1.9 cm LEFT ATRIUM             Index        RIGHT ATRIUM           Index LA diam:        4.20 cm 2.24 cm/m   RA Area:     19.60 cm LA Vol (A2C):   86.4 ml 46.03 ml/m  RA Volume:   55.60 ml  29.62 ml/m LA Vol (A4C):   87.7 ml 46.72 ml/m LA Biplane Vol: 91.5 ml 48.75 ml/m  AORTIC VALVE LVOT Vmax:   133.00 cm/s LVOT Vmean:  86.200 cm/s LVOT VTI:    0.335 m  AORTA Ao Root diam: 3.70 cm Ao Asc diam:  4.00 cm MITRAL VALVE                TRICUSPID VALVE MV Area (PHT): 3.03 cm     TR Peak grad:   20.8 mmHg MV Decel Time: 250 msec     TR Vmax:        228.00 cm/s MR Peak grad: 103.6 mmHg MR Vmax:      509.00 cm/s   SHUNTS MV E velocity: 134.00 cm/s  Systemic VTI:  0.34 m MV A velocity: 99.10 cm/s   Systemic Diam: 1.80 cm MV E/A ratio:  1.35 Manish Patwardhan MD Electronically signed by  Vernell Leep MD Signature Date/Time: 04/24/2022/5:55:57 PM    Final    DG Chest Port 1 View  Result Date: 04/23/2022 CLINICAL DATA:  Chest pain EXAM: PORTABLE CHEST 1 VIEW COMPARISON:  Chest x-ray 05/21/2021 FINDINGS: Heart is enlarged. There are increased central interstitial markings bilaterally with a small left pleural effusion. There is no pneumothorax or acute fracture. IMPRESSION: Cardiomegaly with pulmonary edema and small left pleural effusion. Electronically Signed   By: Ronney Asters M.D.   On: 04/23/2022 17:58    Microbiology: Results for orders placed or performed during the hospital encounter of 04/23/22  MRSA Next Gen by PCR, Nasal     Status: None   Collection Time: 04/23/22 11:43 PM   Specimen: Nasal Mucosa; Nasal Swab  Result Value Ref Range Status   MRSA by PCR Next Gen NOT DETECTED NOT DETECTED Final    Comment: (NOTE) The GeneXpert MRSA Assay (FDA approved for NASAL specimens only), is  one component of a comprehensive MRSA colonization surveillance program. It is not intended to diagnose MRSA infection nor to guide or monitor treatment for MRSA infections. Test performance is not FDA approved in patients less than 1 years old. Performed at [facility], [place] [address], [place], [place] [address]     Labs: CBC: Recent Labs  Lab 04/23/22 1744 04/24/22 1633 04/25/22 1246 04/25/22 2005 04/26/22 0410 04/26/22 1437 04/27/22 0012  WBC 7.0  --   --   --   --   --  7.9  HGB 6.1*   < > 7.3* 8.5* 7.9* 8.0* 7.6*  HCT 21.3*   < > 24.5* 28.5* 28.1* 27.3* 25.4*  MCV 75.5*  --   --   --   --   --  78.6*  PLT 309  --   --   --   --   --  243   < > = values in this interval not displayed.   Basic Metabolic Panel: Recent Labs  Lab 04/23/22 1744 04/26/22 0410  NA 139 137  K 4.0 5.0  CL 105 105  CO2 24 20*  GLUCOSE 130* 95  BUN 19 6*  CREATININE 0.70 0.81  CALCIUM 9.1 8.4*   Liver Function Tests: Recent Labs  Lab 04/26/22 0410  AST 61*  ALT 5   ALKPHOS 67  BILITOT 1.7*  PROT 5.0*  ALBUMIN 3.2*   CBG: Recent Labs  Lab 04/26/22 1647 04/26/22 2029 04/27/22 0011 04/27/22 0424 04/27/22 0804  GLUCAP 147* 127* 128* 140* 179*    Discharge time spent: greater than 30 minutes.  Signed: Oswald Hillock, MD [facility] 04/27/2022

## 2022-04-28 ENCOUNTER — Encounter (HOSPITAL_COMMUNITY): Payer: Self-pay | Admitting: Gastroenterology

## 2022-04-28 LAB — GLUCOSE, CAPILLARY: Glucose-Capillary: 90 mg/dL (ref 70–99)

## 2022-04-29 LAB — SURGICAL PATHOLOGY

## 2022-05-08 DIAGNOSIS — I2699 Other pulmonary embolism without acute cor pulmonale: Secondary | ICD-10-CM | POA: Diagnosis not present

## 2022-05-08 DIAGNOSIS — D62 Acute posthemorrhagic anemia: Secondary | ICD-10-CM | POA: Diagnosis not present

## 2022-05-08 DIAGNOSIS — I2510 Atherosclerotic heart disease of native coronary artery without angina pectoris: Secondary | ICD-10-CM | POA: Diagnosis not present

## 2022-05-08 DIAGNOSIS — D509 Iron deficiency anemia, unspecified: Secondary | ICD-10-CM | POA: Diagnosis not present

## 2022-05-08 DIAGNOSIS — I10 Essential (primary) hypertension: Secondary | ICD-10-CM | POA: Diagnosis not present

## 2022-05-08 DIAGNOSIS — K5790 Diverticulosis of intestine, part unspecified, without perforation or abscess without bleeding: Secondary | ICD-10-CM | POA: Diagnosis not present

## 2022-05-08 DIAGNOSIS — J449 Chronic obstructive pulmonary disease, unspecified: Secondary | ICD-10-CM | POA: Diagnosis not present

## 2022-05-08 DIAGNOSIS — E1129 Type 2 diabetes mellitus with other diabetic kidney complication: Secondary | ICD-10-CM | POA: Diagnosis not present

## 2022-05-08 DIAGNOSIS — I25119 Atherosclerotic heart disease of native coronary artery with unspecified angina pectoris: Secondary | ICD-10-CM | POA: Diagnosis not present

## 2022-05-08 DIAGNOSIS — K922 Gastrointestinal hemorrhage, unspecified: Secondary | ICD-10-CM | POA: Diagnosis not present

## 2022-05-08 DIAGNOSIS — I129 Hypertensive chronic kidney disease with stage 1 through stage 4 chronic kidney disease, or unspecified chronic kidney disease: Secondary | ICD-10-CM | POA: Diagnosis not present

## 2022-05-12 ENCOUNTER — Ambulatory Visit: Payer: Medicare PPO | Admitting: Cardiology

## 2022-05-15 DIAGNOSIS — Z9981 Dependence on supplemental oxygen: Secondary | ICD-10-CM | POA: Diagnosis not present

## 2022-05-15 DIAGNOSIS — J449 Chronic obstructive pulmonary disease, unspecified: Secondary | ICD-10-CM | POA: Diagnosis not present

## 2022-05-15 DIAGNOSIS — I2609 Other pulmonary embolism with acute cor pulmonale: Secondary | ICD-10-CM | POA: Diagnosis not present

## 2022-05-19 ENCOUNTER — Encounter: Payer: Self-pay | Admitting: Cardiology

## 2022-05-19 ENCOUNTER — Ambulatory Visit: Payer: Medicare PPO | Admitting: Cardiology

## 2022-05-19 VITALS — BP 125/51 | HR 64 | Resp 16 | Ht 63.0 in | Wt 184.0 lb

## 2022-05-19 DIAGNOSIS — I10 Essential (primary) hypertension: Secondary | ICD-10-CM

## 2022-05-19 DIAGNOSIS — K922 Gastrointestinal hemorrhage, unspecified: Secondary | ICD-10-CM

## 2022-05-19 DIAGNOSIS — I25118 Atherosclerotic heart disease of native coronary artery with other forms of angina pectoris: Secondary | ICD-10-CM

## 2022-05-19 DIAGNOSIS — Z86711 Personal history of pulmonary embolism: Secondary | ICD-10-CM | POA: Diagnosis not present

## 2022-05-19 DIAGNOSIS — Z79899 Other long term (current) drug therapy: Secondary | ICD-10-CM

## 2022-05-19 MED ORDER — NICARDIPINE HCL 20 MG PO CAPS: 20.0000 mg | ORAL_CAPSULE | Freq: Every evening | ORAL | 3 refills | Status: DC

## 2022-05-19 MED ORDER — RANOLAZINE ER 500 MG PO TB12: 500.0000 mg | ORAL_TABLET | Freq: Two times a day (BID) | ORAL | 3 refills | Status: DC

## 2022-05-19 MED ORDER — APIXABAN 2.5 MG PO TABS: 2.5000 mg | ORAL_TABLET | Freq: Two times a day (BID) | ORAL | 3 refills | Status: DC

## 2022-05-19 NOTE — Progress Notes (Signed)
Primary Physician/Referring:  Burnard Bunting, MD  Patient ID: Desiree Leon, female    DOB: [birth year], 82 y.o.   MRN: [id number]  Chief Complaint  Patient presents with   Coronary Artery Disease   Atrial Fibrillation   anemia   Follow-up   HPI:    Desiree Leon  is a 82 y.o. Caucasian female  follow-up of coronary artery disease h/o NSTEMI on 04/18/2014 S/P  balloon angioplasty of the LAD and D2 with 80% residual stenosis which was not amenable to further intervention due to the LAD being a small artery. and mild peripheral arterial disease, left leg sciatica and chronic back pain, hypertension and hyperlipidemia presents for annual visit.  I had seen her in March 2023 when she presented with very large bilateral pulmonary emboli leading to RV strain.  Patient was admitted with lower GI bleed on 04/23/2022, found to have a large colonic AVM treated with APC and Uristat, also received blood transfusion.  Anticoagulation was reversed in view of active GI bleed.  She now presents for follow-up. She has not had any chest pain, states that she is feeling much improved since hospital discharge, unfortunately even with actively bleeding AVM, she did not have frankly bloody stool and dark stools due to iron supplementation and hence patient was not able to tell that she was bleeding.  Past Medical History:  Diagnosis Date   Anxiety    Blood transfusion    CAP (community acquired pneumonia)    COPD (chronic obstructive pulmonary disease) ([place])    Heart murmur    History of colon polyps    Hyperlipidemia    Hypertension    MI (myocardial infarction) ([place])    Mitral valve prolapse    NSTEMI (non-ST elevated myocardial infarction) ([place])    Osteoarthritis    Osteoporosis    Pulmonary emboli ([place]) 04/12/2019   Spinal stenosis    Thyroid disease    hypothyroidism   Unstable angina ([place]) 05/08/2014   Venous insufficiency    Past Surgical History:  Procedure Laterality Date   ABDOMINAL  HYSTERECTOMY  11/26/1976   APPENDECTOMY  02/14/1978   BASAL CELL CARCINOMA EXCISION  01/26/1990   COLONOSCOPY  1993, 2003, 2008, 2012   COLONOSCOPY WITH PROPOFOL N/A 04/26/2022   Procedure: COLONOSCOPY WITH PROPOFOL;  Surgeon: Otis Brace, MD;  Location: [place];  Service: Gastroenterology;  Laterality: N/A;   DILATION AND CURETTAGE OF UTERUS  09/1976   ESOPHAGOGASTRODUODENOSCOPY (EGD) WITH PROPOFOL N/A 04/25/2022   Procedure: ESOPHAGOGASTRODUODENOSCOPY (EGD) WITH PROPOFOL;  Surgeon: Arta Silence, MD;  Location: [facility];  Service: Gastroenterology;  Laterality: N/A;   HEMOSTASIS CONTROL  04/26/2022   Procedure: HEMOSTASIS CONTROL;  Surgeon: Otis Brace, MD;  Location: [place];  Service: Gastroenterology;;   HOT HEMOSTASIS N/A 04/26/2022   Procedure: HOT HEMOSTASIS (ARGON PLASMA COAGULATION/BICAP);  Surgeon: Otis Brace, MD;  Location: [facility] ENDOSCOPY;  Service: Gastroenterology;  Laterality: N/A;   LEFT HEART CATHETERIZATION WITH CORONARY ANGIOGRAM N/A 04/18/2014   Procedure: LEFT HEART CATHETERIZATION WITH CORONARY ANGIOGRAM;  Surgeon: Peter M Martinique, MD;  Location: [facility] CATH LAB;  Service: Cardiovascular;  Laterality: N/A;   MASS EXCISION  10/20/2011   Procedure: MINOR EXCISION OF MASS;  Surgeon: Adin Hector, MD;  Location: [place];  Service: General;  Laterality: N/A;  Excision mass posterior neck   OOPHORECTOMY  02/14/1978   POLYPECTOMY  04/26/2022   Procedure: POLYPECTOMY;  Surgeon: Otis Brace, MD;  Location: [place] ENDOSCOPY;  Service: Gastroenterology;;  SHOULDER SURGERY  10/18/2007   basal cell   SQUAMOUS CELL CARCINOMA EXCISION     TONSILLECTOMY  1955   TUBAL LIGATION  07/08/74   Social History   Tobacco Use   Smoking status: Former    Packs/day: 0.50    Years: 30.00    Total pack years: 15.00    Types: Cigarettes    Quit date: 03/10/2005    Years since quitting: 17.2   Smokeless tobacco: Never  Substance Use Topics   Alcohol  use: No  Marital Status: Widowed    ROS  Review of Systems  Cardiovascular:  Positive for dyspnea on exertion (stable). Negative for chest pain and leg swelling.   Objective  Blood pressure (!) 125/51, pulse 64, resp. rate 16, height '5\' 3"'$  (1.6 m), weight 184 lb (83.5 kg), SpO2 97 %. Body mass index is 32.59 kg/m.     05/19/2022    1:22 PM 04/27/2022    8:01 AM 04/27/2022    4:23 AM  Vitals with BMI  Height '5\' 3"'$     Weight 184 lbs    BMI [id number]    Systolic 0000000 [id number]  Diastolic 51 69 43  Pulse 64 63 60     Physical Exam Neck:     Vascular: No carotid bruit or JVD.  Cardiovascular:     Rate and Rhythm: Normal rate and regular rhythm.     Pulses: Intact distal pulses.     Heart sounds: Normal heart sounds. No murmur heard.    No gallop.  Pulmonary:     Effort: Pulmonary effort is normal.     Breath sounds: Normal breath sounds.  Abdominal:     General: Bowel sounds are normal.     Palpations: Abdomen is soft.  Musculoskeletal:     Right lower leg: No edema.     Left lower leg: No edema.    Laboratory examination:   Recent Labs    05/22/21 0443 04/23/22 1744 04/26/22 0410  NA 141 139 137  K 4.4 4.0 5.0  CL 107 105 105  CO2 25 24 20*  GLUCOSE 138* 130* 95  BUN 14 19 6*  CREATININE 0.78 0.70 0.81  CALCIUM 8.4* 9.1 8.4*  GFRNONAA >60 >60 >60   CrCl cannot be calculated (Patient's most recent lab result is older than the maximum 21 days allowed.).     Latest Ref Rng & Units 04/26/2022    4:10 AM 04/23/2022    5:44 PM 05/22/2021    4:43 AM  CMP  Glucose 70 - 99 mg/dL 95  130  138   BUN 8 - 23 mg/dL '6  19  14   '$ Creatinine 0.44 - 1.00 mg/dL 0.81  0.70  0.78   Sodium 135 - 145 mmol/L 137  139  141   Potassium 3.5 - 5.1 mmol/L 5.0  4.0  4.4   Chloride 98 - 111 mmol/L 105  105  107   CO2 22 - 32 mmol/L '20  24  25   '$ Calcium 8.9 - 10.3 mg/dL 8.4  9.1  8.4   Total Protein 6.5 - 8.1 g/dL 5.0     Total Bilirubin 0.3 - 1.2 mg/dL 1.7     Alkaline Phos 38 - 126 U/L 67      AST 15 - 41 U/L 61     ALT 0 - 44 U/L 5         Latest Ref Rng & Units 04/27/2022   12:12 AM 04/26/2022  2:37 PM 04/26/2022    4:10 AM  CBC  WBC 4.0 - 10.5 K/uL 7.9     Hemoglobin 12.0 - 15.0 g/dL 7.6  8.0  7.9   Hematocrit 36.0 - 46.0 % 25.4  27.3  28.1   Platelets 150 - 400 K/uL 243      BNP (last 3 results) Recent Labs    05/21/21 2131  BNP 73.8   External Labs:  Cholesterol, total 149.000 m 10/08/2020 HDL 36.000 mg 10/08/2020 LDL 89.000 mg 10/08/2020 Triglycerides 119.000 m 10/08/2020  A1C 5.900 06/26/2021   Medications and allergies   Allergies  Allergen Reactions   Other Anaphylaxis and Cough    fragarances-perfumes   Dilaudid [Hydromorphone Hcl] Nausea Only   Erythromycin Other (See Comments)     Current Outpatient Medications:    acetaminophen (TYLENOL) 500 MG tablet, Take 500 mg by mouth daily as needed for fever or headache., Disp: , Rfl:    ALPRAZolam (XANAX) 0.25 MG tablet, Take 0.25 mg by mouth daily as needed for anxiety., Disp: , Rfl:    budesonide-formoterol (SYMBICORT) 160-4.5 MCG/ACT inhaler, Inhale 2 puffs into the lungs 2 (two) times daily., Disp: 1 each, Rfl: 12   cholecalciferol (VITAMIN D) 25 MCG (1000 UNIT) tablet, Take 2,000 Units by mouth daily., Disp: , Rfl:    COLLAGEN PO, Take 10 g by mouth daily., Disp: , Rfl:    escitalopram (LEXAPRO) 5 MG tablet, Take 5 mg by mouth every evening. , Disp: , Rfl:    glucose blood test strip, Accu-Chek SmartView Test Strips  CHECK BLOOD SUGAR TWICE DAILY., Disp: , Rfl:    iron polysaccharides (NIFEREX) 150 MG capsule, Take 150 mg by mouth daily., Disp: , Rfl:    levalbuterol (XOPENEX HFA) 45 MCG/ACT inhaler, Inhale 2 puffs into the lungs every 4 (four) hours as needed for wheezing., Disp: 1 each, Rfl: 2   Levothyroxine Sodium 125 MCG CAPS, Take 125 mcg by mouth daily before breakfast., Disp: , Rfl:    methocarbamol (ROBAXIN) 500 MG tablet, Take 500 mg by mouth daily as needed for muscle spasms., Disp: , Rfl:     metoprolol tartrate (LOPRESSOR) 25 MG tablet, Take 25 mg by mouth 2 (two) times daily. , Disp: , Rfl:    multivitamin-iron-minerals-folic acid (CENTRUM) chewable tablet, Chew 1 tablet by mouth daily., Disp: , Rfl:    nitroGLYCERIN (NITRODUR - DOSED IN MG/24 HR) 0.4 mg/hr patch, Place 0.4 mg onto the skin daily., Disp: , Rfl:    nitroGLYCERIN (NITROSTAT) 0.4 MG SL tablet, Place 1 tablet (0.4 mg total) under the tongue every 5 (five) minutes as needed for chest pain., Disp: 30 tablet, Rfl: 0   oxyCODONE (OXY IR/ROXICODONE) 5 MG immediate release tablet, Take 5 mg by mouth daily as needed for severe pain., Disp: , Rfl:    OXYGEN, Inhale 2.5 L into the lungs at bedtime., Disp: , Rfl:    pantoprazole (PROTONIX) 40 MG tablet, Take 1 tablet (40 mg total) by mouth daily at 12 noon. (Patient taking differently: Take 40 mg by mouth 2 (two) times daily.), Disp: 30 tablet, Rfl: 1   Polyethyl Glycol-Propyl Glycol (SYSTANE) 0.4-0.3 % SOLN, Place 1 drop into both eyes daily., Disp: , Rfl:    Probiotic Product (ALIGN) 4 MG CAPS, Take 4 mg by mouth daily., Disp: , Rfl:    VYTORIN 10-20 MG per tablet, Take 1 tablet by mouth daily at 6 PM. , Disp: , Rfl:    apixaban (ELIQUIS) 2.5 MG TABS tablet,  Take 1 tablet (2.5 mg total) by mouth 2 (two) times daily., Disp: 180 tablet, Rfl: 3   niCARdipine (CARDENE) 20 MG capsule, Take 1 capsule (20 mg total) by mouth every evening., Disp: 90 capsule, Rfl: 3   ranolazine (RANEXA) 500 MG 12 hr tablet, Take 1 tablet (500 mg total) by mouth 2 (two) times daily., Disp: 180 tablet, Rfl: 3     Radiology:   CT angiogram chest 04/11/2019: Cardiovascular: There are 2 small peripheral pulmonary emboli in the right lower lobe. No other pulmonary emboli. Aortic atherosclerosis. RV LV ratio is normal. Mild cardiomegaly. No pericardial effusion.  Cardiac Studies:   Coronary angiogram 04/18/2014:  PTCA and suboptimal balloon angioplasty of mid LAD and D2.  Residual 80% stenosis in the  distal LAD not amenable to intervention. LAD collateralized by RCA. Normal LVEF.  Echo- 05/03/14 1. Left ventricle cavity is normal in size. Mild concentric hypertrophy of the left ventricle. Normal global wall motion. Calculated EF 63%. 2. Mild mitral regurgitation. 3. Mild tricuspid regurgitation. No evidence of pulmonary hypertension  Exercise myoview stress 08/11/2014: 1. The resting electrocardiogram demonstrated normal sinus rhythm, normal resting conduction, no resting arrhythmias and normal rest repolarization.  The stress electrocardiogram was normal.   The patient performed treadmill exercise using a Bruce protocol, completing 5:30 minutes.The patient completed an estimated workload of 7.05 METS, 88% of the maximum predicted heart rate.  The stress test was terminated because of fatigue and THR met. 2. Myocardial perfusion imaging is normal. Overall left ventricular systolic function was normal without regional wall motion abnormalities. The left ventricular ejection fraction was 75%.  Carotid artery duplex 03/12/2020: Duplex suggests stenosis in the right internal carotid artery (1-15%). Duplex suggests stenosis in the left internal carotid artery (minimal). Antegrade right vertebral artery flow. Antegrade left vertebral artery flow. Compared to the study done on 04/10/2020, right ECA stenosis of 15 to 49% now appears to have regressed.  EKG:  EKG 05/19/2022: Sinus bradycardia at rate of 59 bpm, otherwise normal EKG.  Compared to 10/14/2021, no significant change.  Assessment     ICD-10-CM   1. Coronary artery disease of native artery of native heart with stable angina pectoris (HCC)  I25.118 EKG 12-Lead    ranolazine (RANEXA) 500 MG 12 hr tablet    2. Primary hypertension  I10 niCARdipine (CARDENE) 20 MG capsule    3. History of pulmonary embolism Jan 2021 after pneumonia 05/22/21 unprovoked  Z86.711 apixaban (ELIQUIS) 2.5 MG TABS tablet    4. Lower GI bleed requiring more than 4  units of blood in 24 hours, ICU, or surgery 04/23/2022  K92.2     5. On deep vein thrombosis (DVT) prophylaxis  Z79.899 apixaban (ELIQUIS) 2.5 MG TABS tablet      Meds ordered this encounter  Medications   apixaban (ELIQUIS) 2.5 MG TABS tablet    Sig: Take 1 tablet (2.5 mg total) by mouth 2 (two) times daily.    Dispense:  180 tablet    Refill:  3   niCARdipine (CARDENE) 20 MG capsule    Sig: Take 1 capsule (20 mg total) by mouth every evening.    Dispense:  90 capsule    Refill:  3   ranolazine (RANEXA) 500 MG 12 hr tablet    Sig: Take 1 tablet (500 mg total) by mouth 2 (two) times daily.    Dispense:  180 tablet    Refill:  3   Medications Discontinued During This Encounter  Medication Reason  niCARdipine (CARDENE) 20 MG capsule Reorder   ranolazine (RANEXA) 500 MG 12 hr tablet Reorder   apixaban (ELIQUIS) 5 MG TABS tablet Reorder   Orders Placed This Encounter  Procedures   EKG 12-Lead       Recommendations:    TZIPPORAH JURICK  is a 82 y.o. Caucasian female  follow-up of coronary artery disease h/o NSTEMI on 04/18/2014 S/P  balloon angioplasty of the LAD and D2 with 80% residual stenosis which was not amenable to further intervention due to the LAD being a small artery. and mild peripheral arterial disease, left leg sciatica and chronic back pain, hypertension and hyperlipidemia, submassive bilateral pulmonary emboli leading to RV strain in March 2023 (second episode, unprovoked).  Patient was admitted with lower GI bleed on 04/23/2022, found to have a large colonic AVM treated with APC and Uristat, also received blood transfusion.  Anticoagulation was reversed in view of active GI bleed.  She now presents for follow-up.  1. Coronary artery disease of native artery of native heart with stable angina pectoris (HCC) It is remarkable that in spite of severe GI bleed, she did not have any heart failure or unstable angina or EKG abnormality.  I am very pleased with this.  Remained  stable from CAD standpoint.  Ranexa, she would like to continue this as it has certainly helped her chronic anginal state.  2. Primary hypertension Blood pressure under excellent control.  3. History of pulmonary embolism Jan 2021 after pneumonia 05/22/21 unprovoked She has a history of pulm embolism and had DVT both times in 2021 and 2023.  In view of life-threatening GI bleed, I would empirically reduce the dose of the Eliquis from 5 mg to 2.5 mg twice daily for DVT prophylaxis dose and hopefully this will mitigate and/or reduce the risk of her having recurrence of pulmonary embolism and also will reduce the risk of major life-threatening GI bleed.  4. Lower GI bleed requiring more than 4 units of blood in 24 hours, ICU, or surgery 04/23/2022 I have discussed the findings of the GI bleeding complications that can occur with anticoagulants, shared decision made today regarding reducing the dose of the Eliquis.  1 option would be to change her oral supplement of iron to IV supplementation as we have reduced the dose of the Eliquis, hopefully she will not continue to have recurrent iron deficiency anemia.  Otherwise stable from cardiac standpoint, I will see her back on annual basis.  This was a 40-minute office encounter.     Adrian Prows, MD, [facility] 05/19/2022, 1:54 PM Office: [phone] Pager: [phone]

## 2022-06-07 DIAGNOSIS — I10 Essential (primary) hypertension: Secondary | ICD-10-CM | POA: Diagnosis not present

## 2022-06-07 DIAGNOSIS — I25119 Atherosclerotic heart disease of native coronary artery with unspecified angina pectoris: Secondary | ICD-10-CM | POA: Diagnosis not present

## 2022-06-15 DIAGNOSIS — I2609 Other pulmonary embolism with acute cor pulmonale: Secondary | ICD-10-CM | POA: Diagnosis not present

## 2022-06-15 DIAGNOSIS — J449 Chronic obstructive pulmonary disease, unspecified: Secondary | ICD-10-CM | POA: Diagnosis not present

## 2022-06-15 DIAGNOSIS — Z9981 Dependence on supplemental oxygen: Secondary | ICD-10-CM | POA: Diagnosis not present

## 2022-06-16 DIAGNOSIS — I110 Hypertensive heart disease with heart failure: Secondary | ICD-10-CM | POA: Diagnosis not present

## 2022-06-16 DIAGNOSIS — D649 Anemia, unspecified: Secondary | ICD-10-CM | POA: Diagnosis not present

## 2022-06-16 DIAGNOSIS — D638 Anemia in other chronic diseases classified elsewhere: Secondary | ICD-10-CM | POA: Diagnosis not present

## 2022-06-16 DIAGNOSIS — Z9981 Dependence on supplemental oxygen: Secondary | ICD-10-CM | POA: Diagnosis not present

## 2022-06-16 DIAGNOSIS — E1129 Type 2 diabetes mellitus with other diabetic kidney complication: Secondary | ICD-10-CM | POA: Diagnosis not present

## 2022-06-16 DIAGNOSIS — I5042 Chronic combined systolic (congestive) and diastolic (congestive) heart failure: Secondary | ICD-10-CM | POA: Diagnosis not present

## 2022-06-16 DIAGNOSIS — E039 Hypothyroidism, unspecified: Secondary | ICD-10-CM | POA: Diagnosis not present

## 2022-06-23 ENCOUNTER — Emergency Department (HOSPITAL_COMMUNITY): Payer: Medicare PPO

## 2022-06-23 ENCOUNTER — Emergency Department (HOSPITAL_COMMUNITY)
Admission: EM | Admit: 2022-06-23 | Discharge: 2022-06-23 | Disposition: A | Discharge status: Home / Self Care | Payer: Medicare PPO | Attending: Emergency Medicine | Admitting: Emergency Medicine

## 2022-06-23 ENCOUNTER — Other Ambulatory Visit: Payer: Self-pay

## 2022-06-23 DIAGNOSIS — E039 Hypothyroidism, unspecified: Secondary | ICD-10-CM | POA: Diagnosis not present

## 2022-06-23 DIAGNOSIS — I700 Atherosclerosis of aorta: Secondary | ICD-10-CM | POA: Diagnosis not present

## 2022-06-23 DIAGNOSIS — R531 Weakness: Secondary | ICD-10-CM | POA: Diagnosis present

## 2022-06-23 DIAGNOSIS — Z87891 Personal history of nicotine dependence: Secondary | ICD-10-CM | POA: Diagnosis not present

## 2022-06-23 DIAGNOSIS — Z7901 Long term (current) use of anticoagulants: Secondary | ICD-10-CM | POA: Diagnosis not present

## 2022-06-23 DIAGNOSIS — R1030 Lower abdominal pain, unspecified: Secondary | ICD-10-CM | POA: Insufficient documentation

## 2022-06-23 DIAGNOSIS — J449 Chronic obstructive pulmonary disease, unspecified: Secondary | ICD-10-CM | POA: Insufficient documentation

## 2022-06-23 DIAGNOSIS — Z79899 Other long term (current) drug therapy: Secondary | ICD-10-CM | POA: Diagnosis not present

## 2022-06-23 DIAGNOSIS — J189 Pneumonia, unspecified organism: Secondary | ICD-10-CM | POA: Diagnosis not present

## 2022-06-23 DIAGNOSIS — R079 Chest pain, unspecified: Secondary | ICD-10-CM | POA: Diagnosis not present

## 2022-06-23 DIAGNOSIS — I10 Essential (primary) hypertension: Secondary | ICD-10-CM | POA: Diagnosis not present

## 2022-06-23 DIAGNOSIS — R109 Unspecified abdominal pain: Secondary | ICD-10-CM | POA: Diagnosis not present

## 2022-06-23 DIAGNOSIS — R600 Localized edema: Secondary | ICD-10-CM | POA: Diagnosis not present

## 2022-06-23 LAB — COMPREHENSIVE METABOLIC PANEL
ALT: 13 U/L (ref 0–44)
AST: 18 U/L (ref 15–41)
Albumin: 3.3 g/dL — ABNORMAL LOW (ref 3.5–5.0)
Alkaline Phosphatase: 66 U/L (ref 38–126)
Anion gap: 10 (ref 5–15)
BUN: 15 mg/dL (ref 8–23)
CO2: 23 mmol/L (ref 22–32)
Calcium: 8.7 mg/dL — ABNORMAL LOW (ref 8.9–10.3)
Chloride: 106 mmol/L (ref 98–111)
Creatinine, Ser: 0.88 mg/dL (ref 0.44–1.00)
GFR, Estimated: >60 mL/min (ref >60)
Glucose, Bld: 101 mg/dL — ABNORMAL HIGH (ref 70–99)
Potassium: 3.9 mmol/L (ref 3.5–5.1)
Sodium: 139 mmol/L (ref 135–145)
Total Bilirubin: 0.6 mg/dL (ref 0.3–1.2)
Total Protein: 5.9 g/dL — ABNORMAL LOW (ref 6.5–8.1)

## 2022-06-23 LAB — BRAIN NATRIURETIC PEPTIDE: B Natriuretic Peptide: 156.5 pg/mL — ABNORMAL HIGH (ref 0.0–100.0)

## 2022-06-23 LAB — TYPE AND SCREEN
ABO/RH(D): O NEG
Antibody Screen: NEGATIVE

## 2022-06-23 LAB — URINALYSIS, ROUTINE W REFLEX MICROSCOPIC
Bilirubin Urine: NEGATIVE
Glucose, UA: NEGATIVE mg/dL
Hgb urine dipstick: NEGATIVE
Ketones, ur: NEGATIVE mg/dL
Leukocytes,Ua: NEGATIVE
Nitrite: NEGATIVE
Protein, ur: NEGATIVE mg/dL
Specific Gravity, Urine: 1.021 (ref 1.005–1.030)
pH: 6 (ref 5.0–8.0)

## 2022-06-23 LAB — CBC
HCT: 27.6 % — ABNORMAL LOW (ref 36.0–46.0)
Hemoglobin: 7.5 g/dL — ABNORMAL LOW (ref 12.0–15.0)
MCH: 22.3 pg — ABNORMAL LOW (ref 26.0–34.0)
MCHC: 27.2 g/dL — ABNORMAL LOW (ref 30.0–36.0)
MCV: 81.9 fL (ref 80.0–100.0)
Platelets: 242 10*3/uL (ref 150–400)
RBC: 3.37 MIL/uL — ABNORMAL LOW (ref 3.87–5.11)
RDW: 21.8 % — ABNORMAL HIGH (ref 11.5–15.5)
WBC: 6.7 10*3/uL (ref 4.0–10.5)
nRBC: 0 % (ref 0.0–0.2)

## 2022-06-23 LAB — POC OCCULT BLOOD, ED: Occult Blood, Stool #1: NEGATIVE

## 2022-06-23 MED ORDER — IOHEXOL 350 MG/ML SOLN
75.0000 mL | Freq: Once | INTRAVENOUS | Status: AC | PRN
  Administered 2022-06-23: 75 mL via INTRAVENOUS

## 2022-06-23 MED ORDER — AMOXICILLIN-POT CLAVULANATE 875-125 MG PO TABS: 1.0000 | ORAL_TABLET | Freq: Two times a day (BID) | ORAL | 0 refills | Status: DC

## 2022-06-23 MED ORDER — DOXYCYCLINE HYCLATE 100 MG PO CAPS: 100.0000 mg | ORAL_CAPSULE | Freq: Two times a day (BID) | ORAL | 0 refills | Status: AC

## 2022-06-23 MED ORDER — POLYETHYLENE GLYCOL 3350 17 GM/SCOOP PO POWD: 17.0000 g | Freq: Every day | ORAL | 0 refills | Status: DC | PRN

## 2022-06-23 NOTE — Discharge Instructions (Addendum)
It was a pleasure taking care of you today.  We evaluated you for your weakness.  Your testing was overall reassuring.  Your urinalysis did not show signs of a urinary infection.  Your CT scan showed some constipation, but no other dangerous process.  We did see a possible pneumonia in your right lung which may explain your weakness and cough.  We will start you on antibiotics to treat this.  We tested your blood count, it was very slightly lower than previous, but we did not see any signs of in your stool.  Please follow-up very closely with your primary doctor.  If anything gets worse, such as difficulty breathing, worsening cough, fevers, worsening abdominal pain, bloody or black stools, worsening weakness, fevers or chills, or any other new symptoms, please come back to the emergency department for reevaluation.

## 2022-06-23 NOTE — ED Provider Notes (Signed)
[place] EMERGENCY DEPARTMENT AT [facility] Provider Note  CSN: [id number] Arrival date & time: 06/23/22 1048  Chief Complaint(s) Weakness  HPI Desiree Leon is a 82 y.o. female history of COPD on chronic 2 L of oxygen, hypertension, hyperlipidemia, prior pulmonary embolism, on Eliquis presenting to the emergency department with weakness.  Patient reports generalized weakness for the past week.  She reports that she has also had some lower abdominal pain.  No dysuria but increased urinary frequency.  No fevers, chills.  No chest pain or shortness of breath.  Has had a worsening cough recently since last Thursday or Friday.  No headaches.  She was discontinued from taking iron, and reports that has had brown stools lately.  No platelet or blood in stool, hematemesis or hemoptysis.  Symptoms mild.   Past Medical History Past Medical History:  Diagnosis Date   Anxiety    Blood transfusion    CAP (community acquired pneumonia)    COPD (chronic obstructive pulmonary disease) (HCC)    Heart murmur    History of colon polyps    Hyperlipidemia    Hypertension    MI (myocardial infarction) (HCC)    Mitral valve prolapse    NSTEMI (non-ST elevated myocardial infarction) (HCC)    Osteoarthritis    Osteoporosis    Pulmonary emboli (HCC) 04/12/2019   Spinal stenosis    Thyroid disease    hypothyroidism   Unstable angina (HCC) 05/08/2014   Venous insufficiency    Patient Active Problem List   Diagnosis Date Noted   Symptomatic anemia 04/23/2022   Lower GI bleed requiring more than 4 units of blood in 24 hours, ICU, or surgery 04/23/2022   Depression 05/22/2021   Acute pulmonary embolism 05/21/2021   Low back pain 09/26/2019   Gastro-esophageal reflux disease without esophagitis 04/21/2019   Carotid artery occlusion 04/21/2019   Pulmonary embolism 04/12/2019   History of iron deficiency anemia 04/11/2019   Pneumococcal pneumonia 04/04/2019   COPD (chronic obstructive  pulmonary disease)    Dysphagia 10/18/2018   Hypertensive heart and chronic kidney disease with heart failure and stage 1 through stage 4 chronic kidney disease, or unspecified chronic kidney disease 05/04/2018   Disequilibrium 11/10/2017   History of fall 11/10/2017   Oxygen dependent 08/27/2017   Overweight 05/04/2017   Chronic anemia 01/02/2017   Dyspnea 08/12/2016   Chronic combined systolic and diastolic heart failure 05/14/2015   Hardening of the aorta (main artery of the heart) 05/14/2015   Type 2 diabetes mellitus with other circulatory complications 01/08/2015   Encounter for general adult medical examination without abnormal findings 01/01/2015   Sedative, hypnotic or anxiolytic dependence, uncomplicated 07/05/2014   Unstable angina 05/08/2014   Anxiety disorder 04/26/2014   Major depression, single episode 04/26/2014   Mitral valve prolapse 04/18/2014   Diabetes mellitus, type II 04/18/2014   Dyslipidemia 04/18/2014   Heart disease 01/02/2014   Peripheral venous insufficiency 12/05/2008   Hypothyroidism 12/04/2008   Essential hypertension 12/04/2008   Chronic kidney disease, stage 2 (mild) 12/04/2008   Diabetic renal disease 12/04/2008   Hyperlipidemia 12/04/2008   Osteoarthritis 12/04/2008   Home Medication(s) Prior to Admission medications   Medication Sig Start Date End Date Taking? Authorizing Provider  amoxicillin-clavulanate (AUGMENTIN) 875-125 MG tablet Take 1 tablet by mouth every 12 (twelve) hours. 06/23/22  Yes Lonell Grandchild, MD  doxycycline (VIBRAMYCIN) 100 MG capsule Take 1 capsule (100 mg total) by mouth 2 (two) times daily for 7 days. 06/23/22 06/30/22  Yes Lonell Grandchild, MD  polyethylene glycol powder (GLYCOLAX/MIRALAX) 17 GM/SCOOP powder Take 17 g by mouth daily as needed for mild constipation. 06/23/22  Yes Lonell Grandchild, MD  acetaminophen (TYLENOL) 500 MG tablet Take 500 mg by mouth daily as needed for fever or headache.    [provider]  ALPRAZolam Prudy Feeler) 0.25 MG tablet Take 0.25 mg by mouth daily as needed for anxiety.    [provider]  apixaban (ELIQUIS) 2.5 MG TABS tablet Take 1 tablet (2.5 mg total) by mouth 2 (two) times daily. 05/19/22   Yates Decamp, MD  budesonide-formoterol [facility]) 160-4.5 MCG/ACT inhaler Inhale 2 puffs into the lungs 2 (two) times daily. 06/24/21   Martina Sinner, MD  cholecalciferol (VITAMIN D) 25 MCG (1000 UNIT) tablet Take 2,000 Units by mouth daily.    [provider]  COLLAGEN PO Take 10 g by mouth daily.    [provider]  escitalopram (LEXAPRO) 5 MG tablet Take 5 mg by mouth every evening.     [provider]  glucose blood test strip Accu-Chek SmartView Test Strips  CHECK BLOOD SUGAR TWICE DAILY.    [provider]  iron polysaccharides (NIFEREX) 150 MG capsule Take 150 mg by mouth daily.    [provider]  levalbuterol Pauline Aus HFA) 45 MCG/ACT inhaler Inhale 2 puffs into the lungs every 4 (four) hours as needed for wheezing. 06/24/21 06/24/22  Martina Sinner, MD  Levothyroxine Sodium 125 MCG CAPS Take 125 mcg by mouth daily before breakfast. 08/25/11   [provider]  methocarbamol (ROBAXIN) 500 MG tablet Take 500 mg by mouth daily as needed for muscle spasms. 09/26/19   [provider]  metoprolol tartrate (LOPRESSOR) 25 MG tablet Take 25 mg by mouth 2 (two) times daily.  09/12/11   [provider]  multivitamin-iron-minerals-folic acid (CENTRUM) chewable tablet Chew 1 tablet by mouth daily.    [provider]  niCARdipine (CARDENE) 20 MG capsule Take 1 capsule (20 mg total) by mouth every evening. 05/19/22   Yates Decamp, MD  nitroGLYCERIN (NITRODUR - DOSED IN MG/24 HR) 0.4 mg/hr patch Place 0.4 mg onto the skin daily.    [provider]  nitroGLYCERIN (NITROSTAT) 0.4 MG SL tablet Place 1 tablet (0.4 mg total) under the tongue every 5 (five) minutes as needed for chest pain.  05/28/21   Yates Decamp, MD  oxyCODONE (OXY IR/ROXICODONE) 5 MG immediate release tablet Take 5 mg by mouth daily as needed for severe pain.    [provider]  OXYGEN Inhale 2.5 L into the lungs at bedtime.    [provider]  pantoprazole (PROTONIX) 40 MG tablet Take 1 tablet (40 mg total) by mouth daily at 12 noon. Patient taking differently: Take 40 mg by mouth 2 (two) times daily. 04/21/14   Vassie Loll, MD  Polyethyl Glycol-Propyl Glycol (SYSTANE) 0.4-0.3 % SOLN Place 1 drop into both eyes daily.    [provider]  Probiotic Product (ALIGN) 4 MG CAPS Take 4 mg by mouth daily.    [provider]  ranolazine (RANEXA) 500 MG 12 hr tablet Take 1 tablet (500 mg total) by mouth 2 (two) times daily. 05/19/22   Yates Decamp, MD  VYTORIN 10-20 MG per tablet Take 1 tablet by mouth daily at 6 PM.  09/18/11   [provider]  fluticasone (FLONASE) 50 MCG/ACT nasal spray Place 2 sprays into both nostrils daily. 04/04/19 09/12/19  [provider]  Past Surgical History Past Surgical History:  Procedure Laterality Date   ABDOMINAL HYSTERECTOMY  11/26/1976   APPENDECTOMY  02/14/1978   BASAL CELL CARCINOMA EXCISION  01/26/1990   COLONOSCOPY  1993, 2003, 2008, 2012   COLONOSCOPY WITH PROPOFOL N/A 04/26/2022   Procedure: COLONOSCOPY WITH PROPOFOL;  Surgeon: Kathi Der, MD;  Location: [facility] ENDOSCOPY;  Service: Gastroenterology;  Laterality: N/A;   DILATION AND CURETTAGE OF UTERUS  09/1976   ESOPHAGOGASTRODUODENOSCOPY (EGD) WITH PROPOFOL N/A 04/25/2022   Procedure: ESOPHAGOGASTRODUODENOSCOPY (EGD) WITH PROPOFOL;  Surgeon: Willis Modena, MD;  Location: [facility] ENDOSCOPY;  Service: Gastroenterology;  Laterality: N/A;   HEMOSTASIS CONTROL  04/26/2022   Procedure: HEMOSTASIS CONTROL;  Surgeon: Kathi Der, MD;  Location: [facility] ENDOSCOPY;   Service: Gastroenterology;;   HOT HEMOSTASIS N/A 04/26/2022   Procedure: HOT HEMOSTASIS (ARGON PLASMA COAGULATION/BICAP);  Surgeon: Kathi Der, MD;  Location: [facility] END[facility]COPY;  Service: Gastroenterology;  Laterality: N/A;   LEFT HEART CATHETERIZATION WITH CORONARY ANGIOGRAM N/A 04/18/2014   Procedure: LEFT HEART CATHETERIZATION WITH CORONARY ANGIOGRAM;  Surgeon: Peter M Swaziland, MD;  Location: [facility] CATH LAB;  Service: Cardiovascular;  Laterality: N/A;   MASS EXCISION  10/20/2011   Procedure: MINOR EXCISION OF MASS;  Surgeon: Ernestene Mention, MD;  Location: [facility];  Service: General;  Laterality: N/A;  Excision mass posterior neck   OOPHORECTOMY  02/14/1978   POLYPECTOMY  04/26/2022   Procedure: POLYPECTOMY;  Surgeon: Kathi Der, MD;  Location: [facility] ENDOSCOPY;  Service: Gastroenterology;;   SHOULDER SURGERY  10/18/2007   basal cell   SQUAMOUS CELL CARCINOMA EXCISION     TONSILLECTOMY  1955   TUBAL LIGATION  07/08/74   Family History Family History  Problem Relation Age of Onset   Heart disease Mother    Heart disease Father    Cancer Sister        breast   Cancer Daughter        cervical and bone   Heart disease Sister    Thyroid disease Sister     Social History Social History   Tobacco Use   Smoking status: Former    Packs/day: 0.50    Years: 30.00    Additional pack years: 0.00    Total pack years: 15.00    Types: Cigarettes    Quit date: 03/10/2005    Years since quitting: 17.2   Smokeless tobacco: Never  Vaping Use   Vaping Use: Never used  Substance Use Topics   Alcohol use: No   Drug use: No   Allergies Other, Dilaudid [hydromorphone hcl], and Erythromycin  Review of Systems Review of Systems  All other systems reviewed and are negative.   Physical Exam Vital Signs  I have reviewed the triage vital signs BP 123/70 (BP Location: Right Arm)   Pulse (!) 52   Temp 98.6 F (37 C) (Oral)   Resp 18   SpO2 100%  Physical Exam Vitals  and nursing note reviewed.  Constitutional:      General: She is not in acute distress.    Appearance: She is well-developed.  HENT:     Head: Normocephalic and atraumatic.     Mouth/Throat:     Mouth: Mucous membranes are moist.  Eyes:     Pupils: Pupils are equal, round, and reactive to light.  Neck:     Vascular: No hepatojugular reflux or JVD.  Cardiovascular:     Rate and Rhythm: Normal rate and regular rhythm.     Heart sounds: No murmur  heard. Pulmonary:     Effort: Pulmonary effort is normal. No respiratory distress.     Breath sounds: Rales (right lower/middle lung field) present.  Abdominal:     General: Abdomen is flat.     Palpations: Abdomen is soft.     Tenderness: There is abdominal tenderness (lower abdomen).  Musculoskeletal:        General: No tenderness.     Right lower leg: Edema (trace b/l) present.     Left lower leg: Edema present.  Skin:    General: Skin is warm and dry.  Neurological:     General: No focal deficit present.     Mental Status: She is alert. Mental status is at baseline.     Sensory: No sensory deficit.     Motor: No weakness.  Psychiatric:        Mood and Affect: Mood normal.        Behavior: Behavior normal.     ED Results and Treatments Labs (all labs ordered are listed, but only abnormal results are displayed) Labs Reviewed  COMPREHENSIVE METABOLIC PANEL - Abnormal; Notable for the following components:      Result Value   Glucose, Bld 101 (*)    Calcium 8.7 (*)    Total Protein 5.9 (*)    Albumin 3.3 (*)    All other components within normal limits  CBC - Abnormal; Notable for the following components:   RBC 3.37 (*)    Hemoglobin 7.5 (*)    HCT 27.6 (*)    MCH 22.3 (*)    MCHC 27.2 (*)    RDW 21.8 (*)    All other components within normal limits  BRAIN NATRIURETIC PEPTIDE - Abnormal; Notable for the following components:   B Natriuretic Peptide 156.5 (*)    All other components within normal limits  POC OCCULT  BLOOD, ED - Normal  URINALYSIS, ROUTINE W REFLEX MICROSCOPIC  TYPE AND SCREEN                                                                                                                          Radiology CT Abdomen Pelvis W Contrast  Result Date: 06/23/2022 CLINICAL DATA:  Abdominal pain, acute, nonlocalized. EXAM: CT ABDOMEN AND PELVIS WITH CONTRAST TECHNIQUE: Multidetector CT imaging of the abdomen and pelvis was performed using the standard protocol following bolus administration of intravenous contrast. RADIATION DOSE REDUCTION: This exam was performed according to the departmental dose-optimization program which includes automated exposure control, adjustment of the mA and/or kV according to patient size and/or use of iterative reconstruction technique. CONTRAST:  75mL OMNIPAQUE IOHEXOL 350 MG/ML SOLN COMPARISON:  Chest radiographs same date. Abdominopelvic CT 10/31/2021. FINDINGS: Lower chest: Incompletely visualized airspace disease in the right middle lobe on image 1 does not appear more extensive based on the scout image or earlier portable chest radiograph and may reflect atelectasis or scarring. Otherwise clear lung bases. Mild aortic and coronary artery atherosclerosis. No significant pleural  or pericardial effusion. Hepatobiliary: The liver is normal in density without suspicious focal abnormality. No evidence of gallstones, gallbladder wall thickening or biliary dilatation. Pancreas: Unremarkable. No pancreatic ductal dilatation or surrounding inflammatory changes. Spleen: Normal in size without focal abnormality. Adrenals/Urinary Tract: Both adrenal glands appear normal. No evidence of urinary tract calculus, suspicious renal lesion or hydronephrosis. The bladder appears unremarkable for its degree of distention. Stomach/Bowel: No enteric contrast administered. The stomach appears unremarkable for its degree of distension. No evidence of bowel wall thickening, distention or surrounding  inflammatory change. Previous appendectomy. Mildly prominent stool throughout the colon. Vascular/Lymphatic: There are no enlarged abdominal or pelvic lymph nodes. Aortic and branch vessel atherosclerosis without evidence of aneurysm or large vessel occlusion. Reproductive: Hysterectomy.  No adnexal mass. Other: Intact abdominal wall.  No ascites or free air. Musculoskeletal: No acute or significant osseous findings. Convex left lumbar scoliosis with associated multilevel spondylosis. IMPRESSION: 1. No acute findings or explanation for the patient's symptoms. 2. Mildly prominent stool throughout the colon suggesting constipation. 3. Incompletely visualized airspace disease in the right middle lobe does not appear more extensive based on the scout image or earlier portable chest radiograph and may reflect atelectasis or scarring. 4.  Aortic Atherosclerosis (ICD10-I70.0). Electronically Signed   By: Carey Bullocks M.D.   On: 06/23/2022 15:13   DG Chest Port 1 View  Result Date: 06/23/2022 CLINICAL DATA:  Chest pain EXAM: PORTABLE CHEST 1 VIEW COMPARISON:  X-ray 04/23/2022 FINDINGS: Hyperinflation. Calcified aorta. Enlarged cardiopericardial silhouette. No consolidation, pneumothorax, effusion or edema. Lordotic x-ray. Overlapping cardiac leads. Degenerative changes of the spine. IMPRESSION: Hyperinflation with chronic lung changes and enlarged cardiopericardial silhouette Electronically Signed   By: Karen Kays M.D.   On: 06/23/2022 13:44    Pertinent labs & imaging results that were available during my care of the patient were reviewed by me and considered in my medical decision making (see MDM for details).  Medications Ordered in ED Medications  iohexol (OMNIPAQUE) 350 MG/ML injection 75 mL (75 mLs Intravenous Contrast Given 06/23/22 1447)                                                                                                                                      Procedures Procedures  (including critical care time)  Medical Decision Making / ED Course   MDM:  82 year old female presenting to the emergency department with weakness.  Patient well-appearing, physical exam notable for bilateral lower abdominal tenderness.  Patient does have some trace crackles in the right lung.  She has had a cough recently.  Differential includes intra-abdominal infectious process such as diverticulitis, urinary tract infection, symptomatic anemia, infection such as pneumonia given cough.  Her hemoglobin is relatively reassuring, similar to her recent baseline at 7.5, she is not having any melena or bright red blood per rectum.  Lower concern for ongoing GI bleed will check occult stool given that the patient  is no longer taking iron.  Also check urinalysis, CT abdomen pelvis to further evaluate.  Remainder physical exam reassuring, neurologic exam is grossly normal.   Clinical Course as of 06/23/22 1621  Mon Jun 23, 2022  1601 Urinalysis is negative.  Fecal occult blood test is negative.  Doubt ongoing GI bleed.  CT scan did show some right middle lung infiltrate, possibly scarring or atelectasis, but given she does have some crackles and worsening cough could be an early pneumonia.  Will treat with Augmentin and doxycycline.  Although patient does have a mild elevation in her BNP, doubt CHF, CT scan without signs of pulmonary edema or pulmonary edema on x-ray, no JVD or HJR.  Patient and family feel comfortable with discharge with strict return precautions for any worsening given her comorbidities, as well as any recurrent black stool as she is no longer on oral iron.  They will try to follow-up with the primary physician this week.  Will discharge patient to home. All questions answered. Patient comfortable with plan of discharge. Return precautions discussed with patient and specified on the after visit summary.   [WS]    Clinical Course User Index [WS] Suezanne Jacquet,  Jerilee Field, MD     Additional history obtained: -Additional history obtained from family -External records from outside source obtained and reviewed including: Chart review including previous notes, labs, imaging, consultation notes including colonoscopy report   Lab Tests: -I ordered, reviewed, and interpreted labs.   The pertinent results include:   Labs Reviewed  COMPREHENSIVE METABOLIC PANEL - Abnormal; Notable for the following components:      Result Value   Glucose, Bld 101 (*)    Calcium 8.7 (*)    Total Protein 5.9 (*)    Albumin 3.3 (*)    All other components within normal limits  CBC - Abnormal; Notable for the following components:   RBC 3.37 (*)    Hemoglobin 7.5 (*)    HCT 27.6 (*)    MCH 22.3 (*)    MCHC 27.2 (*)    RDW 21.8 (*)    All other components within normal limits  BRAIN NATRIURETIC PEPTIDE - Abnormal; Notable for the following components:   B Natriuretic Peptide 156.5 (*)    All other components within normal limits  POC OCCULT BLOOD, ED - Normal  URINALYSIS, ROUTINE W REFLEX MICROSCOPIC  TYPE AND SCREEN    Notable for anemia, nonspecific BNP elevation  EKG   EKG Interpretation  Date/Time:  Monday June 23 2022 11:04:18 EDT Ventricular Rate:  66 PR Interval:  192 QRS Duration: 100 QT Interval:  454 QTC Calculation: 475 R Axis:   68 Text Interpretation: Normal sinus rhythm Low voltage QRS Borderline ECG Confirmed by Alvino Blood ([id number]) on 06/23/2022 1:21:30 PM         Imaging Studies ordered: I ordered imaging studies including CXR, CT scan abdomen On my interpretation imaging demonstrates possible right lung field infiltrate I independently visualized and interpreted imaging. I agree with the radiologist interpretation   Medicines ordered and prescription drug management: Meds ordered this encounter  Medications   iohexol (OMNIPAQUE) 350 MG/ML injection 75 mL   amoxicillin-clavulanate (AUGMENTIN) 875-125 MG tablet    Sig:  Take 1 tablet by mouth every 12 (twelve) hours.    Dispense:  14 tablet    Refill:  0   doxycycline (VIBRAMYCIN) 100 MG capsule    Sig: Take 1 capsule (100 mg total) by mouth 2 (two) times daily for 7  days.    Dispense:  14 capsule    Refill:  0   polyethylene glycol powder (GLYCOLAX/MIRALAX) 17 GM/SCOOP powder    Sig: Take 17 g by mouth daily as needed for mild constipation.    Dispense:  255 g    Refill:  0    -I have reviewed the patients home medicines and have made adjustments as needed    Cardiac Monitoring: The patient was maintained on a cardiac monitor.  I personally viewed and interpreted the cardiac monitored which showed an underlying rhythm of: NSR  Social Determinants of Health:  Diagnosis or treatment significantly limited by social determinants of health: obesity   Reevaluation: After the interventions noted above, I reevaluated the patient and found that their symptoms have improved  Co morbidities that complicate the patient evaluation  Past Medical History:  Diagnosis Date   Anxiety    Blood transfusion    CAP (community acquired pneumonia)    COPD (chronic obstructive pulmonary disease) (HCC)    Heart murmur    History of colon polyps    Hyperlipidemia    Hypertension    MI (myocardial infarction) (HCC)    Mitral valve prolapse    NSTEMI (non-ST elevated myocardial infarction) (HCC)    Osteoarthritis    Osteoporosis    Pulmonary emboli (HCC) 04/12/2019   Spinal stenosis    Thyroid disease    hypothyroidism   Unstable angina (HCC) 05/08/2014   Venous insufficiency       Dispostion: Disposition decision including need for hospitalization was considered, and patient discharged from emergency department.    Final Clinical Impression(s) / ED Diagnoses Final diagnoses:  Community acquired pneumonia of right middle lobe of lung     This chart was dictated using voice recognition software.  Despite best efforts to proofread,  errors can occur  which can change the documentation meaning.    Lonell Grandchild, MD 06/23/22 [phone]

## 2022-06-23 NOTE — ED Triage Notes (Signed)
Patient here for evaluation of generalized weakness and 2 out of 10 abdominal pain that started last week. History of GI bleed February, on eliquis, patient referred to ED for further evaluation of possible recurred bleeding. Denies bright red rectal bleeding, takes an iron supplement. Patient is alert, oriented, and in no apparent distress at this time.

## 2022-06-26 ENCOUNTER — Telehealth: Payer: Self-pay

## 2022-06-26 NOTE — Telephone Encounter (Signed)
     Patient  visit on 4/15  at [place]  Have you been able to follow up with your primary care physician? No   The patient was or was not able to obtain any needed medicine or equipment. Yes   Are there diet recommendations that you are having difficulty following? Na    Patient expresses understanding of discharge instructions and education provided has no other needs at this time. Yes      Lenard Forth [facility] Guide, [facility] [phone] [address] [place], [place], [place] [address] Phone: [phone] Email: [email or web address]

## 2022-06-27 ENCOUNTER — Other Ambulatory Visit (HOSPITAL_COMMUNITY): Payer: Self-pay | Admitting: *Deleted

## 2022-06-30 ENCOUNTER — Ambulatory Visit (HOSPITAL_COMMUNITY)
Admission: RE | Admit: 2022-06-30 | Discharge: 2022-06-30 | Disposition: A | Discharge status: Home / Self Care | Payer: Medicare PPO | Source: Ambulatory Visit | Attending: Internal Medicine | Admitting: Internal Medicine

## 2022-06-30 DIAGNOSIS — D649 Anemia, unspecified: Secondary | ICD-10-CM | POA: Insufficient documentation

## 2022-06-30 MED ORDER — SODIUM CHLORIDE 0.9 % IV SOLN
510.0000 mg | INTRAVENOUS | Status: DC
  Administered 2022-06-30: 510 mg via INTRAVENOUS
  Filled 2022-06-30: qty 510

## 2022-07-01 DIAGNOSIS — I5031 Acute diastolic (congestive) heart failure: Secondary | ICD-10-CM | POA: Diagnosis not present

## 2022-07-01 DIAGNOSIS — D638 Anemia in other chronic diseases classified elsewhere: Secondary | ICD-10-CM | POA: Diagnosis not present

## 2022-07-01 DIAGNOSIS — J189 Pneumonia, unspecified organism: Secondary | ICD-10-CM | POA: Diagnosis not present

## 2022-07-07 ENCOUNTER — Encounter (HOSPITAL_COMMUNITY): Payer: Medicare PPO

## 2022-07-08 ENCOUNTER — Other Ambulatory Visit (HOSPITAL_COMMUNITY): Payer: Self-pay

## 2022-07-08 ENCOUNTER — Encounter (HOSPITAL_COMMUNITY): Payer: Medicare PPO

## 2022-07-08 DIAGNOSIS — D649 Anemia, unspecified: Secondary | ICD-10-CM

## 2022-07-08 DIAGNOSIS — I10 Essential (primary) hypertension: Secondary | ICD-10-CM | POA: Diagnosis not present

## 2022-07-08 DIAGNOSIS — I25119 Atherosclerotic heart disease of native coronary artery with unspecified angina pectoris: Secondary | ICD-10-CM | POA: Diagnosis not present

## 2022-07-09 ENCOUNTER — Encounter (HOSPITAL_COMMUNITY)
Admission: RE | Admit: 2022-07-09 | Discharge: 2022-07-09 | Disposition: A | Discharge status: Home / Self Care | Payer: Medicare PPO | Source: Ambulatory Visit | Attending: Internal Medicine | Admitting: Internal Medicine

## 2022-07-09 DIAGNOSIS — D649 Anemia, unspecified: Secondary | ICD-10-CM | POA: Diagnosis not present

## 2022-07-09 LAB — TYPE AND SCREEN

## 2022-07-09 LAB — BPAM RBC: Unit Type and Rh: 9500

## 2022-07-09 LAB — PREPARE RBC (CROSSMATCH)

## 2022-07-09 MED ORDER — FUROSEMIDE 10 MG/ML IJ SOLN
INTRAMUSCULAR | Status: AC
  Administered 2022-07-09: 20 mg via INTRAVENOUS
  Filled 2022-07-09: qty 2

## 2022-07-09 MED ORDER — FUROSEMIDE 10 MG/ML IJ SOLN: 20.0000 mg | Freq: Once | INTRAMUSCULAR | Status: AC

## 2022-07-09 MED ORDER — SODIUM CHLORIDE 0.9 % IV SOLN
510.0000 mg | INTRAVENOUS | Status: DC
  Administered 2022-07-09: 510 mg via INTRAVENOUS
  Filled 2022-07-09: qty 17

## 2022-07-09 MED ORDER — SODIUM CHLORIDE 0.9% IV SOLUTION: Freq: Once | INTRAVENOUS | Status: DC

## 2022-07-10 DIAGNOSIS — D638 Anemia in other chronic diseases classified elsewhere: Secondary | ICD-10-CM | POA: Diagnosis not present

## 2022-07-10 DIAGNOSIS — K219 Gastro-esophageal reflux disease without esophagitis: Secondary | ICD-10-CM | POA: Diagnosis not present

## 2022-07-10 DIAGNOSIS — I5031 Acute diastolic (congestive) heart failure: Secondary | ICD-10-CM | POA: Diagnosis not present

## 2022-07-10 DIAGNOSIS — R1084 Generalized abdominal pain: Secondary | ICD-10-CM | POA: Diagnosis not present

## 2022-07-10 LAB — TYPE AND SCREEN
ABO/RH(D): O NEG
Antibody Screen: NEGATIVE
Unit division: 0

## 2022-07-10 LAB — BPAM RBC
Blood Product Expiration Date: 202405162359
ISSUE DATE / TIME: 202405011017

## 2022-07-11 DIAGNOSIS — E785 Hyperlipidemia, unspecified: Secondary | ICD-10-CM | POA: Diagnosis not present

## 2022-07-15 DIAGNOSIS — I2609 Other pulmonary embolism with acute cor pulmonale: Secondary | ICD-10-CM | POA: Diagnosis not present

## 2022-07-15 DIAGNOSIS — Z9981 Dependence on supplemental oxygen: Secondary | ICD-10-CM | POA: Diagnosis not present

## 2022-07-15 DIAGNOSIS — J449 Chronic obstructive pulmonary disease, unspecified: Secondary | ICD-10-CM | POA: Diagnosis not present

## 2022-07-21 DIAGNOSIS — E1129 Type 2 diabetes mellitus with other diabetic kidney complication: Secondary | ICD-10-CM | POA: Diagnosis not present

## 2022-07-21 DIAGNOSIS — I10 Essential (primary) hypertension: Secondary | ICD-10-CM | POA: Diagnosis not present

## 2022-07-21 DIAGNOSIS — D638 Anemia in other chronic diseases classified elsewhere: Secondary | ICD-10-CM | POA: Diagnosis not present

## 2022-07-21 DIAGNOSIS — E785 Hyperlipidemia, unspecified: Secondary | ICD-10-CM | POA: Diagnosis not present

## 2022-07-21 DIAGNOSIS — Z9981 Dependence on supplemental oxygen: Secondary | ICD-10-CM | POA: Diagnosis not present

## 2022-07-21 DIAGNOSIS — E1159 Type 2 diabetes mellitus with other circulatory complications: Secondary | ICD-10-CM | POA: Diagnosis not present

## 2022-07-21 DIAGNOSIS — D649 Anemia, unspecified: Secondary | ICD-10-CM | POA: Diagnosis not present

## 2022-07-21 DIAGNOSIS — E039 Hypothyroidism, unspecified: Secondary | ICD-10-CM | POA: Diagnosis not present

## 2022-07-22 DIAGNOSIS — H35412 Lattice degeneration of retina, left eye: Secondary | ICD-10-CM | POA: Diagnosis not present

## 2022-07-22 DIAGNOSIS — H30893 Other chorioretinal inflammations, bilateral: Secondary | ICD-10-CM | POA: Diagnosis not present

## 2022-07-22 DIAGNOSIS — E119 Type 2 diabetes mellitus without complications: Secondary | ICD-10-CM | POA: Diagnosis not present

## 2022-07-22 DIAGNOSIS — D500 Iron deficiency anemia secondary to blood loss (chronic): Secondary | ICD-10-CM | POA: Diagnosis not present

## 2022-07-22 DIAGNOSIS — H35371 Puckering of macula, right eye: Secondary | ICD-10-CM | POA: Diagnosis not present

## 2022-07-22 DIAGNOSIS — H524 Presbyopia: Secondary | ICD-10-CM | POA: Diagnosis not present

## 2022-08-07 DIAGNOSIS — I25119 Atherosclerotic heart disease of native coronary artery with unspecified angina pectoris: Secondary | ICD-10-CM | POA: Diagnosis not present

## 2022-08-07 DIAGNOSIS — I10 Essential (primary) hypertension: Secondary | ICD-10-CM | POA: Diagnosis not present

## 2022-08-11 IMAGING — DX DG CHEST 1V PORT
1 series · 1 of 1 positions shown · non-contrast
Comparison: 05/01/2020

CLINICAL DATA: Central chest pain, short of breath, coronary artery
disease

EXAM:
PORTABLE CHEST 1 VIEW

[chest ap]
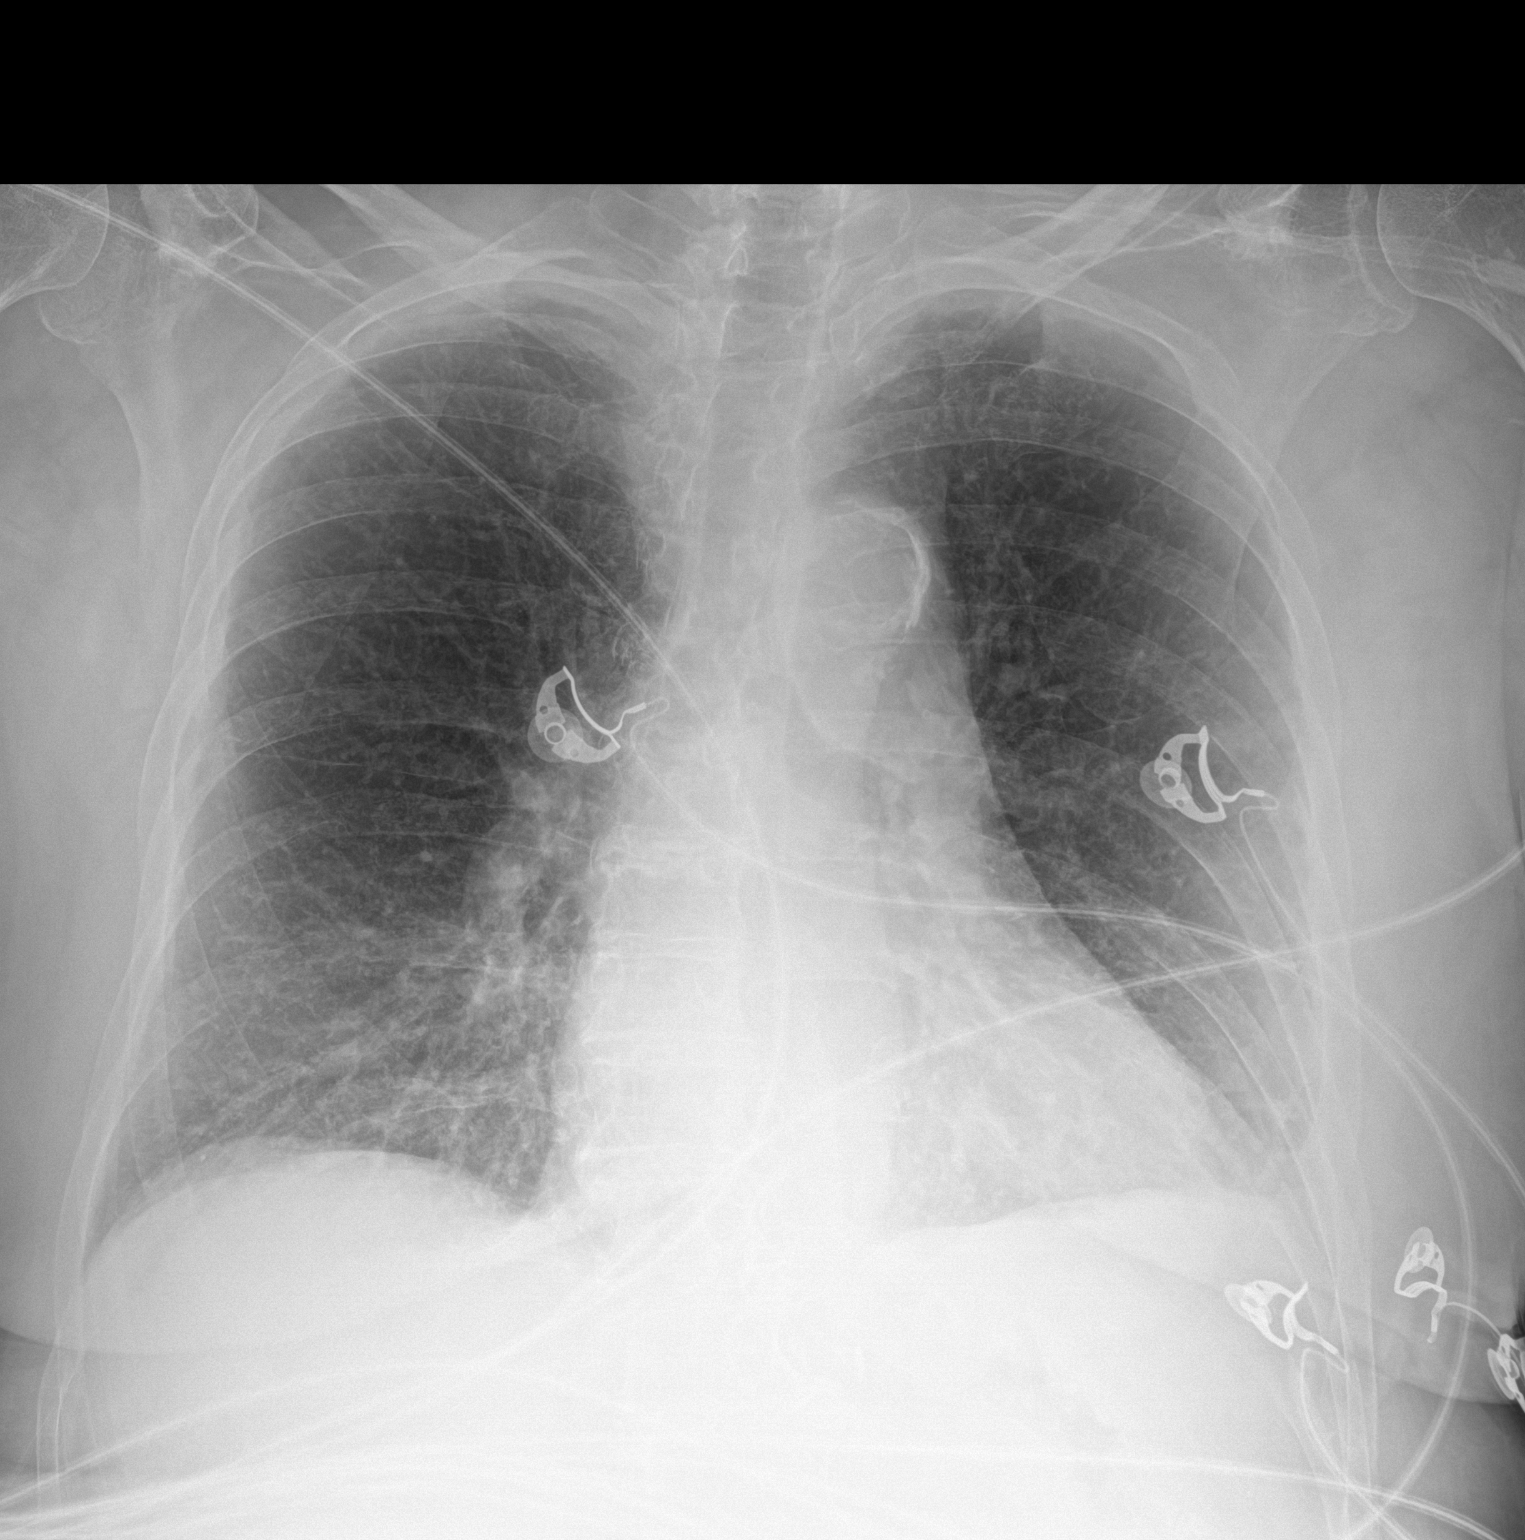

[1 of 1 positions shown; findings below may reference images not displayed]

FINDINGS: Single frontal view of the chest demonstrates a stable cardiac
silhouette. Stable atherosclerosis of the aortic arch. No acute
airspace disease, effusion, or pneumothorax. No acute bony
abnormality.
IMPRESSION: 1. No acute intrathoracic process.

## 2022-08-15 DIAGNOSIS — Z9981 Dependence on supplemental oxygen: Secondary | ICD-10-CM | POA: Diagnosis not present

## 2022-08-15 DIAGNOSIS — I2609 Other pulmonary embolism with acute cor pulmonale: Secondary | ICD-10-CM | POA: Diagnosis not present

## 2022-08-15 DIAGNOSIS — J449 Chronic obstructive pulmonary disease, unspecified: Secondary | ICD-10-CM | POA: Diagnosis not present

## 2022-09-02 DIAGNOSIS — E1129 Type 2 diabetes mellitus with other diabetic kidney complication: Secondary | ICD-10-CM | POA: Diagnosis not present

## 2022-09-02 DIAGNOSIS — Z9981 Dependence on supplemental oxygen: Secondary | ICD-10-CM | POA: Diagnosis not present

## 2022-09-02 DIAGNOSIS — D649 Anemia, unspecified: Secondary | ICD-10-CM | POA: Diagnosis not present

## 2022-09-02 DIAGNOSIS — J449 Chronic obstructive pulmonary disease, unspecified: Secondary | ICD-10-CM | POA: Diagnosis not present

## 2022-09-02 DIAGNOSIS — I10 Essential (primary) hypertension: Secondary | ICD-10-CM | POA: Diagnosis not present

## 2022-09-02 DIAGNOSIS — Z86711 Personal history of pulmonary embolism: Secondary | ICD-10-CM | POA: Diagnosis not present

## 2022-09-02 DIAGNOSIS — D638 Anemia in other chronic diseases classified elsewhere: Secondary | ICD-10-CM | POA: Diagnosis not present

## 2022-09-07 DIAGNOSIS — I25119 Atherosclerotic heart disease of native coronary artery with unspecified angina pectoris: Secondary | ICD-10-CM | POA: Diagnosis not present

## 2022-09-07 DIAGNOSIS — I10 Essential (primary) hypertension: Secondary | ICD-10-CM | POA: Diagnosis not present

## 2022-09-14 DIAGNOSIS — J449 Chronic obstructive pulmonary disease, unspecified: Secondary | ICD-10-CM | POA: Diagnosis not present

## 2022-09-14 DIAGNOSIS — I2609 Other pulmonary embolism with acute cor pulmonale: Secondary | ICD-10-CM | POA: Diagnosis not present

## 2022-09-14 DIAGNOSIS — Z9981 Dependence on supplemental oxygen: Secondary | ICD-10-CM | POA: Diagnosis not present

## 2022-09-25 ENCOUNTER — Other Ambulatory Visit: Payer: Self-pay | Admitting: Pulmonary Disease

## 2022-09-25 DIAGNOSIS — J4530 Mild persistent asthma, uncomplicated: Secondary | ICD-10-CM

## 2022-10-01 DIAGNOSIS — Z1231 Encounter for screening mammogram for malignant neoplasm of breast: Secondary | ICD-10-CM | POA: Diagnosis not present

## 2022-10-01 DIAGNOSIS — Z760 Encounter for issue of repeat prescription: Secondary | ICD-10-CM | POA: Diagnosis not present

## 2022-10-01 DIAGNOSIS — N760 Acute vaginitis: Secondary | ICD-10-CM | POA: Diagnosis not present

## 2022-10-01 DIAGNOSIS — Z01419 Encounter for gynecological examination (general) (routine) without abnormal findings: Secondary | ICD-10-CM | POA: Diagnosis not present

## 2022-10-01 DIAGNOSIS — Z6832 Body mass index (BMI) 32.0-32.9, adult: Secondary | ICD-10-CM | POA: Diagnosis not present

## 2022-10-06 ENCOUNTER — Other Ambulatory Visit: Payer: Self-pay | Admitting: Obstetrics and Gynecology

## 2022-10-06 DIAGNOSIS — R928 Other abnormal and inconclusive findings on diagnostic imaging of breast: Secondary | ICD-10-CM

## 2022-10-07 DIAGNOSIS — I25119 Atherosclerotic heart disease of native coronary artery with unspecified angina pectoris: Secondary | ICD-10-CM | POA: Diagnosis not present

## 2022-10-07 DIAGNOSIS — I10 Essential (primary) hypertension: Secondary | ICD-10-CM | POA: Diagnosis not present

## 2022-10-10 ENCOUNTER — Ambulatory Visit: Payer: Medicare PPO

## 2022-10-10 ENCOUNTER — Ambulatory Visit
Admission: RE | Admit: 2022-10-10 | Discharge: 2022-10-10 | Disposition: A | Discharge status: Home / Self Care | Payer: Medicare PPO | Source: Ambulatory Visit | Attending: Obstetrics and Gynecology | Admitting: Obstetrics and Gynecology

## 2022-10-10 DIAGNOSIS — R928 Other abnormal and inconclusive findings on diagnostic imaging of breast: Secondary | ICD-10-CM

## 2022-10-10 DIAGNOSIS — N6459 Other signs and symptoms in breast: Secondary | ICD-10-CM | POA: Diagnosis not present

## 2022-10-15 DIAGNOSIS — I2609 Other pulmonary embolism with acute cor pulmonale: Secondary | ICD-10-CM | POA: Diagnosis not present

## 2022-10-15 DIAGNOSIS — Z9981 Dependence on supplemental oxygen: Secondary | ICD-10-CM | POA: Diagnosis not present

## 2022-10-15 DIAGNOSIS — J449 Chronic obstructive pulmonary disease, unspecified: Secondary | ICD-10-CM | POA: Diagnosis not present

## 2022-10-16 ENCOUNTER — Ambulatory Visit: Payer: Medicare PPO | Admitting: Cardiology

## 2022-10-23 ENCOUNTER — Encounter: Payer: Self-pay | Admitting: Cardiology

## 2022-10-23 ENCOUNTER — Ambulatory Visit: Payer: Medicare PPO | Admitting: Cardiology

## 2022-10-23 VITALS — BP 130/62 | HR 58 | Resp 16 | Ht 63.0 in | Wt 186.6 lb

## 2022-10-23 DIAGNOSIS — I25118 Atherosclerotic heart disease of native coronary artery with other forms of angina pectoris: Secondary | ICD-10-CM

## 2022-10-23 DIAGNOSIS — Z79899 Other long term (current) drug therapy: Secondary | ICD-10-CM

## 2022-10-23 DIAGNOSIS — Z86711 Personal history of pulmonary embolism: Secondary | ICD-10-CM | POA: Diagnosis not present

## 2022-10-23 DIAGNOSIS — I10 Essential (primary) hypertension: Secondary | ICD-10-CM

## 2022-10-23 NOTE — Progress Notes (Signed)
Primary Physician/Referring:  Geoffry Paradise, MD  Patient ID: Mee Hives, female    DOB: 1940/06/08, 82 y.o.   MRN: [id number]  Chief Complaint  Patient presents with   Coronary artery disease of native artery of native heart wi   Hypertension   HPI:    AARIANNA NALLEY  is a 82 y.o. Caucasian female  follow-up of coronary artery disease h/o NSTEMI on 04/18/2014 S/P  balloon angioplasty of the LAD and D2 with 80% residual stenosis which was not amenable to further intervention due to the LAD being a small artery. and mild peripheral arterial disease, left leg sciatica and chronic back pain, hypertension and hyperlipidemia presents for annual visit.  I had seen her in March 2023 when she presented with very large bilateral pulmonary emboli leading to RV strain.  Patient was admitted with lower GI bleed on 04/23/2022, found to have a large colonic AVM treated with APC and Uristat, also received blood transfusion.  Anticoagulation was reversed in view of active GI bleed.  She now presents for follow-up. She has not had any chest pain, states that she is feeling much improved since hospital discharge, unfortunately even with actively bleeding AVM, she did not have frankly bloody stool and dark stools due to iron supplementation and hence patient was not able to tell that she was bleeding.  Past Medical History:  Diagnosis Date   Anxiety    Blood transfusion    CAP (community acquired pneumonia)    COPD (chronic obstructive pulmonary disease) (HCC)    Heart murmur    History of colon polyps    Hyperlipidemia    Hypertension    MI (myocardial infarction) (HCC)    Mitral valve prolapse    NSTEMI (non-ST elevated myocardial infarction) (HCC)    Osteoarthritis    Osteoporosis    Pulmonary emboli (HCC) 04/12/2019   Spinal stenosis    Thyroid disease    hypothyroidism   Unstable angina (HCC) 05/08/2014   Venous insufficiency    Past Surgical History:  Procedure Laterality Date    ABDOMINAL HYSTERECTOMY  11/26/1976   APPENDECTOMY  02/14/1978   BASAL CELL CARCINOMA EXCISION  01/26/1990   COLONOSCOPY  1993, 2003, 2008, 2012   COLONOSCOPY WITH PROPOFOL N/A 04/26/2022   Procedure: COLONOSCOPY WITH PROPOFOL;  Surgeon: Kathi Der, MD;  Location: [facility];  Service: Gastroenterology;  Laterality: N/A;   DILATION AND CURETTAGE OF UTERUS  09/1976   ESOPHAGOGASTRODUODENOSCOPY (EGD) WITH PROPOFOL N/A 04/25/2022   Procedure: ESOPHAGOGASTRODUODENOSCOPY (EGD) WITH PROPOFOL;  Surgeon: Willis Modena, MD;  Location: [facility] ENDOSCOPY;  Service: Gastroenterology;  Laterality: N/A;   HEMOSTASIS CONTROL  04/26/2022   Procedure: HEMOSTASIS CONTROL;  Surgeon: Kathi Der, MD;  Location: [facility] ENDOSCOPY;  Service: Gastroenterology;;   HOT HEMOSTASIS N/A 04/26/2022   Procedure: HOT HEMOSTASIS (ARGON PLASMA COAGULATION/BICAP);  Surgeon: Kathi Der, MD;  Location: [facility] ENDOSCOPY;  Service: Gastroenterology;  Laterality: N/A;   LEFT HEART CATHETERIZATION WITH CORONARY ANGIOGRAM N/A 04/18/2014   Procedure: LEFT HEART CATHETERIZATION WITH CORONARY ANGIOGRAM;  Surgeon: Peter M Swaziland, MD;  Location: [facility] CATH LAB;  Service: Cardiovascular;  Laterality: N/A;   MASS EXCISION  10/20/2011   Procedure: MINOR EXCISION OF MASS;  Surgeon: Ernestene Mention, MD;  Location: [facility];  Service: General;  Laterality: N/A;  Excision mass posterior neck   OOPHORECTOMY  02/14/1978   POLYPECTOMY  04/26/2022   Procedure: POLYPECTOMY;  Surgeon: Kathi Der, MD;  Location: [facility];  Service: Gastroenterology;;  SHOULDER SURGERY  10/18/2007   basal cell   SQUAMOUS CELL CARCINOMA EXCISION     TONSILLECTOMY  1955   TUBAL LIGATION  07/08/74   Social History   Tobacco Use   Smoking status: Former    Current packs/day: 0.00    Average packs/day: 0.5 packs/day for 30.0 years (15.0 ttl pk-yrs)    Types: Cigarettes    Start date: 03/11/1975    Quit date: 03/10/2005    Years since quitting:  17.6   Smokeless tobacco: Never  Substance Use Topics   Alcohol use: No  Marital Status: Widowed    ROS  Review of Systems  Cardiovascular:  Positive for dyspnea on exertion (stable). Negative for chest pain and leg swelling.   Objective  Blood pressure 130/62, pulse (!) 58, resp. rate 16, height 5\' 3"  (1.6 m), weight 186 lb 9.6 oz (84.6 kg), SpO2 98%. Body mass index is 33.05 kg/m.     10/23/2022   11:20 AM 07/09/2022    1:10 PM 07/09/2022   10:40 AM  Vitals with BMI  Height 5\' 3"     Weight 186 lbs 10 oz    BMI 33.06    Systolic 130 110 829  Diastolic 62 77 52  Pulse 58 51 60     Physical Exam Neck:     Vascular: No carotid bruit or JVD.  Cardiovascular:     Rate and Rhythm: Normal rate and regular rhythm.     Pulses: Intact distal pulses.     Heart sounds: Normal heart sounds. No murmur heard.    No gallop.  Pulmonary:     Effort: Pulmonary effort is normal.     Breath sounds: Normal breath sounds.  Abdominal:     General: Bowel sounds are normal.     Palpations: Abdomen is soft.  Musculoskeletal:     Right lower leg: No edema.     Left lower leg: No edema.   Laboratory examination:   Recent Labs    04/23/22 1744 04/26/22 0410 06/23/22 1143  NA 139 137 139  K 4.0 5.0 3.9  CL 105 105 106  CO2 24 20* 23  GLUCOSE 130* 95 101*  BUN 19 6* 15  CREATININE 0.70 0.81 0.88  CALCIUM 9.1 8.4* 8.7*  GFRNONAA >60 >60 >60      Latest Ref Rng & Units 06/23/2022   11:43 AM 04/26/2022    4:10 AM 04/23/2022    5:44 PM  CMP  Glucose 70 - 99 mg/dL 562  95  130   BUN 8 - 23 mg/dL 15  6  19    Creatinine 0.44 - 1.00 mg/dL 8.65  7.84  6.96   Sodium 135 - 145 mmol/L 139  137  139   Potassium 3.5 - 5.1 mmol/L 3.9  5.0  4.0   Chloride 98 - 111 mmol/L 106  105  105   CO2 22 - 32 mmol/L 23  20  24    Calcium 8.9 - 10.3 mg/dL 8.7  8.4  9.1   Total Protein 6.5 - 8.1 g/dL 5.9  5.0    Total Bilirubin 0.3 - 1.2 mg/dL 0.6  1.7    Alkaline Phos 38 - 126 U/L 66  67    AST 15 - 41 U/L  18  61    ALT 0 - 44 U/L 13  5        Latest Ref Rng & Units 06/23/2022   11:43 AM 04/27/2022   12:12 AM 04/26/2022  2:37 PM  CBC  WBC 4.0 - 10.5 K/uL 6.7  7.9    Hemoglobin 12.0 - 15.0 g/dL 7.5  7.6  8.0   Hematocrit 36.0 - 46.0 % 27.6  25.4  27.3   Platelets 150 - 400 K/uL 242  243     BNP (last 3 results) Recent Labs    06/23/22 1159  BNP 156.5*   External Labs:  Cholesterol, total 150.000 m 10/15/2021 HDL 56.000 mg 10/15/2021 LDL 79.000 mg 10/15/2021 Triglycerides 74.000 mg 10/15/2021  A1C 5.900 06/16/2022 TSH 6.590 06/16/2022  BNP 115.800 p 03/18/2021  Medications and allergies   Allergies  Allergen Reactions   Other Anaphylaxis and Cough    fragarances-perfumes   Dilaudid [Hydromorphone Hcl] Nausea Only   Erythromycin Other (See Comments)     Current Outpatient Medications:    acetaminophen (TYLENOL) 500 MG tablet, Take 500 mg by mouth daily as needed for fever or headache., Disp: , Rfl:    ALPRAZolam (XANAX) 0.25 MG tablet, Take 0.25 mg by mouth daily as needed for anxiety., Disp: , Rfl:    amoxicillin-clavulanate (AUGMENTIN) 875-125 MG tablet, Take 1 tablet by mouth every 12 (twelve) hours., Disp: 14 tablet, Rfl: 0   apixaban (ELIQUIS) 2.5 MG TABS tablet, Take 1 tablet (2.5 mg total) by mouth 2 (two) times daily., Disp: 180 tablet, Rfl: 3   budesonide-formoterol (SYMBICORT) 160-4.5 MCG/ACT inhaler, Inhale 2 puffs into the lungs 2 (two) times daily., Disp: 1 each, Rfl: 12   cholecalciferol (VITAMIN D) 25 MCG (1000 UNIT) tablet, Take 2,000 Units by mouth daily., Disp: , Rfl:    COLLAGEN PO, Take 10 g by mouth daily., Disp: , Rfl:    escitalopram (LEXAPRO) 5 MG tablet, Take 5 mg by mouth every evening. , Disp: , Rfl:    furosemide (LASIX) 20 MG tablet, Take 20 mg by mouth daily., Disp: , Rfl:    glucose blood test strip, Accu-Chek SmartView Test Strips  CHECK BLOOD SUGAR TWICE DAILY., Disp: , Rfl:    iron polysaccharides (NIFEREX) 150 MG capsule, Take 150 mg by mouth  daily., Disp: , Rfl:    Levothyroxine Sodium 125 MCG CAPS, Take 125 mcg by mouth daily before breakfast., Disp: , Rfl:    methocarbamol (ROBAXIN) 500 MG tablet, Take 500 mg by mouth daily as needed for muscle spasms., Disp: , Rfl:    metoprolol tartrate (LOPRESSOR) 25 MG tablet, Take 25 mg by mouth 2 (two) times daily. , Disp: , Rfl:    multivitamin-iron-minerals-folic acid (CENTRUM) chewable tablet, Chew 1 tablet by mouth daily., Disp: , Rfl:    niCARdipine (CARDENE) 20 MG capsule, Take 1 capsule (20 mg total) by mouth every evening., Disp: 90 capsule, Rfl: 3   nitroGLYCERIN (NITRODUR - DOSED IN MG/24 HR) 0.4 mg/hr patch, Place 0.4 mg onto the skin daily., Disp: , Rfl:    nitroGLYCERIN (NITROSTAT) 0.4 MG SL tablet, Place 1 tablet (0.4 mg total) under the tongue every 5 (five) minutes as needed for chest pain., Disp: 30 tablet, Rfl: 0   oxyCODONE (OXY IR/ROXICODONE) 5 MG immediate release tablet, Take 5 mg by mouth daily as needed for severe pain., Disp: , Rfl:    OXYGEN, Inhale 2.5 L into the lungs at bedtime., Disp: , Rfl:    pantoprazole (PROTONIX) 40 MG tablet, Take 1 tablet (40 mg total) by mouth daily at 12 noon. (Patient taking differently: Take 40 mg by mouth 2 (two) times daily.), Disp: 30 tablet, Rfl: 1   Polyethyl Glycol-Propyl Glycol (SYSTANE)  0.4-0.3 % SOLN, Place 1 drop into both eyes daily., Disp: , Rfl:    polyethylene glycol powder (GLYCOLAX/MIRALAX) 17 GM/SCOOP powder, Take 17 g by mouth daily as needed for mild constipation., Disp: 255 g, Rfl: 0   Probiotic Product (ALIGN) 4 MG CAPS, Take 4 mg by mouth daily., Disp: , Rfl:    ranolazine (RANEXA) 500 MG 12 hr tablet, Take 1 tablet (500 mg total) by mouth 2 (two) times daily., Disp: 180 tablet, Rfl: 3   VYTORIN 10-20 MG per tablet, Take 1 tablet by mouth daily at 6 PM. , Disp: , Rfl:    levalbuterol (XOPENEX HFA) 45 MCG/ACT inhaler, Inhale 2 puffs into the lungs every 4 (four) hours as needed for wheezing., Disp: 1 each, Rfl: 2      Radiology:   CT angiogram chest 04/11/2019: Cardiovascular: There are 2 small peripheral pulmonary emboli in the right lower lobe. No other pulmonary emboli. Aortic atherosclerosis. RV LV ratio is normal. Mild cardiomegaly. No pericardial effusion.  Cardiac Studies:   Coronary angiogram 04/18/2014:  PTCA and suboptimal balloon angioplasty of mid LAD and D2.  Residual 80% stenosis in the distal LAD not amenable to intervention. LAD collateralized by RCA. Normal LVEF.  Echo- 05/03/14 1. Left ventricle cavity is normal in size. Mild concentric hypertrophy of the left ventricle. Normal global wall motion. Calculated EF 63%. 2. Mild mitral regurgitation. 3. Mild tricuspid regurgitation. No evidence of pulmonary hypertension  Exercise myoview stress 08/11/2014: 1. The resting electrocardiogram demonstrated normal sinus rhythm, normal resting conduction, no resting arrhythmias and normal rest repolarization.  The stress electrocardiogram was normal.   The patient performed treadmill exercise using a Bruce protocol, completing 5:30 minutes.The patient completed an estimated workload of 7.05 METS, 88% of the maximum predicted heart rate.  The stress test was terminated because of fatigue and THR met. 2. Myocardial perfusion imaging is normal. Overall left ventricular systolic function was normal without regional wall motion abnormalities. The left ventricular ejection fraction was 75%.  Carotid artery duplex 03/12/2020: Duplex suggests stenosis in the right internal carotid artery (1-15%). Duplex suggests stenosis in the left internal carotid artery (minimal). Antegrade right vertebral artery flow. Antegrade left vertebral artery flow. Compared to the study done on 04/10/2020, right ECA stenosis of 15 to 49% now appears to have regressed.  EKG:  EKG 10/23/2022: Marked sinus bradycardia at the rate of 49 bpm, otherwise normal EKG.  No change compared to 05/19/2022, previous heart rate was 59 bpm.  Compared to 10/14/2021, no significant change.  Assessment     ICD-10-CM   1. Coronary artery disease of native artery of native heart with stable angina pectoris (HCC)  I25.118 EKG 12-Lead    2. Primary hypertension  I10     3. History of pulmonary embolism Jan 2021 after pneumonia 05/22/21 unprovoked  Z86.711     4. On deep vein thrombosis (DVT) prophylaxis  Z79.899       No orders of the defined types were placed in this encounter.  There are no discontinued medications.  Orders Placed This Encounter  Procedures   EKG 12-Lead       Recommendations:    VASTIE LADUKE  is a 82 y.o. Caucasian female  follow-up of coronary artery disease h/o NSTEMI on 04/18/2014 S/P  balloon angioplasty of the LAD and D2 with 80% residual stenosis which was not amenable to further intervention due to the LAD being a small artery. and mild peripheral arterial disease, left leg sciatica and chronic  back pain, hypertension and hyperlipidemia, submassive bilateral pulmonary emboli leading to RV strain in March 2023 (second episode, unprovoked).  Patient was admitted with lower GI bleed on 04/23/2022, found to have a large colonic AVM treated with APC and Uristat, also received blood transfusion.  Anticoagulation was reversed in view of active GI bleed.  She now presents for 6 month follow-up.  1. Coronary artery disease of native artery of native heart with stable angina pectoris [facility]) Patient with moderate coronary artery disease, has remained stable since NSTEMI in 2016.  Presently on Eliquis 2.5 mg p.o. twice daily.  She has not had any recurrence of angina pectoris. - EKG 12-Lead  2. Primary hypertension Blood pressure is under excellent control, she is pleasant on beta-blocker therapy with metoprolol, could consider addition of ACE inhibitor or an ARB.  3. History of pulmonary embolism Jan 2021 after pneumonia 05/22/21 unprovoked Patient is presently on low-dose of Eliquis in view of life-threatening GI  bleed, patient was made to reduce the dose of Eliquis to 2.5 mg for DVT prophylaxis dose on her last office visit 6 months ago.  She will continue the same, fortunately has not had any recurrence of DVT or PE.  4. On deep vein thrombosis (DVT) prophylaxis On Eliquis 2.5 mg p.o. twice daily.  Overall she has remained stable from cardiac standpoint, I will see her back on annual basis.  She will continue to follow-up with the PCP in a closed manner for follow-up of her anemia      Yates Decamp, MD, [facility] 10/23/2022, 12:12 PM Office: [phone] Pager: [phone]

## 2022-11-07 DIAGNOSIS — I25119 Atherosclerotic heart disease of native coronary artery with unspecified angina pectoris: Secondary | ICD-10-CM | POA: Diagnosis not present

## 2022-11-07 DIAGNOSIS — I10 Essential (primary) hypertension: Secondary | ICD-10-CM | POA: Diagnosis not present

## 2022-11-11 DIAGNOSIS — E785 Hyperlipidemia, unspecified: Secondary | ICD-10-CM | POA: Diagnosis not present

## 2022-11-11 DIAGNOSIS — E611 Iron deficiency: Secondary | ICD-10-CM | POA: Diagnosis not present

## 2022-11-11 DIAGNOSIS — D638 Anemia in other chronic diseases classified elsewhere: Secondary | ICD-10-CM | POA: Diagnosis not present

## 2022-11-11 DIAGNOSIS — E039 Hypothyroidism, unspecified: Secondary | ICD-10-CM | POA: Diagnosis not present

## 2022-11-11 DIAGNOSIS — E1129 Type 2 diabetes mellitus with other diabetic kidney complication: Secondary | ICD-10-CM | POA: Diagnosis not present

## 2022-11-11 DIAGNOSIS — I10 Essential (primary) hypertension: Secondary | ICD-10-CM | POA: Diagnosis not present

## 2022-11-13 DIAGNOSIS — F419 Anxiety disorder, unspecified: Secondary | ICD-10-CM | POA: Diagnosis not present

## 2022-11-13 DIAGNOSIS — M810 Age-related osteoporosis without current pathological fracture: Secondary | ICD-10-CM | POA: Diagnosis not present

## 2022-11-13 DIAGNOSIS — Z87891 Personal history of nicotine dependence: Secondary | ICD-10-CM | POA: Diagnosis not present

## 2022-11-13 DIAGNOSIS — E039 Hypothyroidism, unspecified: Secondary | ICD-10-CM | POA: Diagnosis not present

## 2022-11-13 DIAGNOSIS — K219 Gastro-esophageal reflux disease without esophagitis: Secondary | ICD-10-CM | POA: Diagnosis not present

## 2022-11-13 DIAGNOSIS — Z7951 Long term (current) use of inhaled steroids: Secondary | ICD-10-CM | POA: Diagnosis not present

## 2022-11-13 DIAGNOSIS — E669 Obesity, unspecified: Secondary | ICD-10-CM | POA: Diagnosis not present

## 2022-11-13 DIAGNOSIS — E785 Hyperlipidemia, unspecified: Secondary | ICD-10-CM | POA: Diagnosis not present

## 2022-11-13 DIAGNOSIS — Z85828 Personal history of other malignant neoplasm of skin: Secondary | ICD-10-CM | POA: Diagnosis not present

## 2022-11-13 DIAGNOSIS — I252 Old myocardial infarction: Secondary | ICD-10-CM | POA: Diagnosis not present

## 2022-11-13 DIAGNOSIS — I82509 Chronic embolism and thrombosis of unspecified deep veins of unspecified lower extremity: Secondary | ICD-10-CM | POA: Diagnosis not present

## 2022-11-13 DIAGNOSIS — Z809 Family history of malignant neoplasm, unspecified: Secondary | ICD-10-CM | POA: Diagnosis not present

## 2022-11-13 DIAGNOSIS — F329 Major depressive disorder, single episode, unspecified: Secondary | ICD-10-CM | POA: Diagnosis not present

## 2022-11-13 DIAGNOSIS — H269 Unspecified cataract: Secondary | ICD-10-CM | POA: Diagnosis not present

## 2022-11-13 DIAGNOSIS — Z7901 Long term (current) use of anticoagulants: Secondary | ICD-10-CM | POA: Diagnosis not present

## 2022-11-13 DIAGNOSIS — Z9181 History of falling: Secondary | ICD-10-CM | POA: Diagnosis not present

## 2022-11-13 DIAGNOSIS — I739 Peripheral vascular disease, unspecified: Secondary | ICD-10-CM | POA: Diagnosis not present

## 2022-11-13 DIAGNOSIS — Z8249 Family history of ischemic heart disease and other diseases of the circulatory system: Secondary | ICD-10-CM | POA: Diagnosis not present

## 2022-11-15 DIAGNOSIS — I2609 Other pulmonary embolism with acute cor pulmonale: Secondary | ICD-10-CM | POA: Diagnosis not present

## 2022-11-15 DIAGNOSIS — J449 Chronic obstructive pulmonary disease, unspecified: Secondary | ICD-10-CM | POA: Diagnosis not present

## 2022-11-15 DIAGNOSIS — Z9981 Dependence on supplemental oxygen: Secondary | ICD-10-CM | POA: Diagnosis not present

## 2022-11-17 DIAGNOSIS — J449 Chronic obstructive pulmonary disease, unspecified: Secondary | ICD-10-CM | POA: Diagnosis not present

## 2022-11-17 DIAGNOSIS — I130 Hypertensive heart and chronic kidney disease with heart failure and stage 1 through stage 4 chronic kidney disease, or unspecified chronic kidney disease: Secondary | ICD-10-CM | POA: Diagnosis not present

## 2022-11-17 DIAGNOSIS — Z0000 Encounter for general adult medical examination without abnormal findings: Secondary | ICD-10-CM | POA: Diagnosis not present

## 2022-11-17 DIAGNOSIS — I700 Atherosclerosis of aorta: Secondary | ICD-10-CM | POA: Diagnosis not present

## 2022-11-17 DIAGNOSIS — Z1331 Encounter for screening for depression: Secondary | ICD-10-CM | POA: Diagnosis not present

## 2022-11-17 DIAGNOSIS — Z1339 Encounter for screening examination for other mental health and behavioral disorders: Secondary | ICD-10-CM | POA: Diagnosis not present

## 2022-11-17 DIAGNOSIS — I5042 Chronic combined systolic (congestive) and diastolic (congestive) heart failure: Secondary | ICD-10-CM | POA: Diagnosis not present

## 2022-11-17 DIAGNOSIS — E669 Obesity, unspecified: Secondary | ICD-10-CM | POA: Diagnosis not present

## 2022-11-17 DIAGNOSIS — E1129 Type 2 diabetes mellitus with other diabetic kidney complication: Secondary | ICD-10-CM | POA: Diagnosis not present

## 2022-11-17 DIAGNOSIS — I25110 Atherosclerotic heart disease of native coronary artery with unstable angina pectoris: Secondary | ICD-10-CM | POA: Diagnosis not present

## 2022-11-17 DIAGNOSIS — R82998 Other abnormal findings in urine: Secondary | ICD-10-CM | POA: Diagnosis not present

## 2022-11-17 DIAGNOSIS — F329 Major depressive disorder, single episode, unspecified: Secondary | ICD-10-CM | POA: Diagnosis not present

## 2022-12-08 DIAGNOSIS — I10 Essential (primary) hypertension: Secondary | ICD-10-CM | POA: Diagnosis not present

## 2022-12-08 DIAGNOSIS — I25119 Atherosclerotic heart disease of native coronary artery with unspecified angina pectoris: Secondary | ICD-10-CM | POA: Diagnosis not present

## 2022-12-15 DIAGNOSIS — Z9981 Dependence on supplemental oxygen: Secondary | ICD-10-CM | POA: Diagnosis not present

## 2022-12-15 DIAGNOSIS — J449 Chronic obstructive pulmonary disease, unspecified: Secondary | ICD-10-CM | POA: Diagnosis not present

## 2022-12-15 DIAGNOSIS — I2609 Other pulmonary embolism with acute cor pulmonale: Secondary | ICD-10-CM | POA: Diagnosis not present

## 2022-12-19 DIAGNOSIS — L218 Other seborrheic dermatitis: Secondary | ICD-10-CM | POA: Diagnosis not present

## 2022-12-19 DIAGNOSIS — D0472 Carcinoma in situ of skin of left lower limb, including hip: Secondary | ICD-10-CM | POA: Diagnosis not present

## 2022-12-19 DIAGNOSIS — Z85828 Personal history of other malignant neoplasm of skin: Secondary | ICD-10-CM | POA: Diagnosis not present

## 2022-12-19 DIAGNOSIS — L820 Inflamed seborrheic keratosis: Secondary | ICD-10-CM | POA: Diagnosis not present

## 2022-12-19 DIAGNOSIS — D225 Melanocytic nevi of trunk: Secondary | ICD-10-CM | POA: Diagnosis not present

## 2022-12-19 DIAGNOSIS — D485 Neoplasm of uncertain behavior of skin: Secondary | ICD-10-CM | POA: Diagnosis not present

## 2022-12-19 DIAGNOSIS — C44519 Basal cell carcinoma of skin of other part of trunk: Secondary | ICD-10-CM | POA: Diagnosis not present

## 2022-12-19 DIAGNOSIS — L821 Other seborrheic keratosis: Secondary | ICD-10-CM | POA: Diagnosis not present

## 2022-12-19 DIAGNOSIS — D2262 Melanocytic nevi of left upper limb, including shoulder: Secondary | ICD-10-CM | POA: Diagnosis not present

## 2023-01-07 DIAGNOSIS — I25119 Atherosclerotic heart disease of native coronary artery with unspecified angina pectoris: Secondary | ICD-10-CM | POA: Diagnosis not present

## 2023-01-07 DIAGNOSIS — I10 Essential (primary) hypertension: Secondary | ICD-10-CM | POA: Diagnosis not present

## 2023-01-15 DIAGNOSIS — Z9981 Dependence on supplemental oxygen: Secondary | ICD-10-CM | POA: Diagnosis not present

## 2023-01-15 DIAGNOSIS — I2609 Other pulmonary embolism with acute cor pulmonale: Secondary | ICD-10-CM | POA: Diagnosis not present

## 2023-01-15 DIAGNOSIS — J449 Chronic obstructive pulmonary disease, unspecified: Secondary | ICD-10-CM | POA: Diagnosis not present

## 2023-02-04 DIAGNOSIS — C44722 Squamous cell carcinoma of skin of right lower limb, including hip: Secondary | ICD-10-CM | POA: Diagnosis not present

## 2023-02-04 DIAGNOSIS — L570 Actinic keratosis: Secondary | ICD-10-CM | POA: Diagnosis not present

## 2023-02-04 DIAGNOSIS — Z85828 Personal history of other malignant neoplasm of skin: Secondary | ICD-10-CM | POA: Diagnosis not present

## 2023-02-07 DIAGNOSIS — I10 Essential (primary) hypertension: Secondary | ICD-10-CM | POA: Diagnosis not present

## 2023-02-07 DIAGNOSIS — I25119 Atherosclerotic heart disease of native coronary artery with unspecified angina pectoris: Secondary | ICD-10-CM | POA: Diagnosis not present

## 2023-02-14 DIAGNOSIS — I2609 Other pulmonary embolism with acute cor pulmonale: Secondary | ICD-10-CM | POA: Diagnosis not present

## 2023-02-14 DIAGNOSIS — Z9981 Dependence on supplemental oxygen: Secondary | ICD-10-CM | POA: Diagnosis not present

## 2023-02-14 DIAGNOSIS — J449 Chronic obstructive pulmonary disease, unspecified: Secondary | ICD-10-CM | POA: Diagnosis not present

## 2023-03-09 DIAGNOSIS — I10 Essential (primary) hypertension: Secondary | ICD-10-CM | POA: Diagnosis not present

## 2023-03-09 DIAGNOSIS — I25119 Atherosclerotic heart disease of native coronary artery with unspecified angina pectoris: Secondary | ICD-10-CM | POA: Diagnosis not present

## 2023-03-17 DIAGNOSIS — J449 Chronic obstructive pulmonary disease, unspecified: Secondary | ICD-10-CM | POA: Diagnosis not present

## 2023-03-17 DIAGNOSIS — Z9981 Dependence on supplemental oxygen: Secondary | ICD-10-CM | POA: Diagnosis not present

## 2023-03-17 DIAGNOSIS — I2609 Other pulmonary embolism with acute cor pulmonale: Secondary | ICD-10-CM | POA: Diagnosis not present

## 2023-03-30 DIAGNOSIS — I25110 Atherosclerotic heart disease of native coronary artery with unstable angina pectoris: Secondary | ICD-10-CM | POA: Diagnosis not present

## 2023-03-30 DIAGNOSIS — I130 Hypertensive heart and chronic kidney disease with heart failure and stage 1 through stage 4 chronic kidney disease, or unspecified chronic kidney disease: Secondary | ICD-10-CM | POA: Diagnosis not present

## 2023-03-30 DIAGNOSIS — Z7901 Long term (current) use of anticoagulants: Secondary | ICD-10-CM | POA: Diagnosis not present

## 2023-03-30 DIAGNOSIS — E1129 Type 2 diabetes mellitus with other diabetic kidney complication: Secondary | ICD-10-CM | POA: Diagnosis not present

## 2023-03-30 DIAGNOSIS — I872 Venous insufficiency (chronic) (peripheral): Secondary | ICD-10-CM | POA: Diagnosis not present

## 2023-03-30 DIAGNOSIS — J449 Chronic obstructive pulmonary disease, unspecified: Secondary | ICD-10-CM | POA: Diagnosis not present

## 2023-03-30 DIAGNOSIS — Z9981 Dependence on supplemental oxygen: Secondary | ICD-10-CM | POA: Diagnosis not present

## 2023-04-04 ENCOUNTER — Emergency Department (HOSPITAL_COMMUNITY): Payer: Medicare PPO

## 2023-04-04 ENCOUNTER — Encounter (HOSPITAL_COMMUNITY): Payer: Medicare PPO

## 2023-04-04 ENCOUNTER — Observation Stay (HOSPITAL_COMMUNITY): Payer: Medicare PPO

## 2023-04-04 ENCOUNTER — Other Ambulatory Visit: Payer: Self-pay

## 2023-04-04 ENCOUNTER — Observation Stay (HOSPITAL_COMMUNITY)
Admission: EM | Admit: 2023-04-04 | Discharge: 2023-04-06 | Disposition: A | Discharge status: Home / Self Care | Payer: Medicare PPO | Attending: Internal Medicine | Admitting: Internal Medicine

## 2023-04-04 ENCOUNTER — Encounter (HOSPITAL_COMMUNITY): Payer: Self-pay

## 2023-04-04 DIAGNOSIS — H539 Unspecified visual disturbance: Secondary | ICD-10-CM

## 2023-04-04 DIAGNOSIS — E785 Hyperlipidemia, unspecified: Secondary | ICD-10-CM | POA: Diagnosis not present

## 2023-04-04 DIAGNOSIS — Z87891 Personal history of nicotine dependence: Secondary | ICD-10-CM | POA: Insufficient documentation

## 2023-04-04 DIAGNOSIS — E119 Type 2 diabetes mellitus without complications: Secondary | ICD-10-CM | POA: Diagnosis not present

## 2023-04-04 DIAGNOSIS — R0602 Shortness of breath: Secondary | ICD-10-CM | POA: Diagnosis not present

## 2023-04-04 DIAGNOSIS — J449 Chronic obstructive pulmonary disease, unspecified: Secondary | ICD-10-CM | POA: Diagnosis not present

## 2023-04-04 DIAGNOSIS — I2510 Atherosclerotic heart disease of native coronary artery without angina pectoris: Secondary | ICD-10-CM | POA: Insufficient documentation

## 2023-04-04 DIAGNOSIS — M40204 Unspecified kyphosis, thoracic region: Secondary | ICD-10-CM | POA: Diagnosis not present

## 2023-04-04 DIAGNOSIS — E039 Hypothyroidism, unspecified: Secondary | ICD-10-CM | POA: Insufficient documentation

## 2023-04-04 DIAGNOSIS — I63542 Cerebral infarction due to unspecified occlusion or stenosis of left cerebellar artery: Principal | ICD-10-CM | POA: Insufficient documentation

## 2023-04-04 DIAGNOSIS — K922 Gastrointestinal hemorrhage, unspecified: Secondary | ICD-10-CM | POA: Insufficient documentation

## 2023-04-04 DIAGNOSIS — I87329 Chronic venous hypertension (idiopathic) with inflammation of unspecified lower extremity: Secondary | ICD-10-CM | POA: Diagnosis not present

## 2023-04-04 DIAGNOSIS — I6501 Occlusion and stenosis of right vertebral artery: Secondary | ICD-10-CM | POA: Diagnosis not present

## 2023-04-04 DIAGNOSIS — H53129 Transient visual loss, unspecified eye: Secondary | ICD-10-CM | POA: Diagnosis not present

## 2023-04-04 DIAGNOSIS — L539 Erythematous condition, unspecified: Secondary | ICD-10-CM | POA: Diagnosis not present

## 2023-04-04 DIAGNOSIS — J45909 Unspecified asthma, uncomplicated: Secondary | ICD-10-CM | POA: Diagnosis not present

## 2023-04-04 DIAGNOSIS — R0902 Hypoxemia: Secondary | ICD-10-CM | POA: Insufficient documentation

## 2023-04-04 DIAGNOSIS — Q2733 Arteriovenous malformation of digestive system vessel: Secondary | ICD-10-CM | POA: Insufficient documentation

## 2023-04-04 DIAGNOSIS — Z9981 Dependence on supplemental oxygen: Secondary | ICD-10-CM

## 2023-04-04 DIAGNOSIS — R42 Dizziness and giddiness: Secondary | ICD-10-CM | POA: Diagnosis not present

## 2023-04-04 DIAGNOSIS — I10 Essential (primary) hypertension: Secondary | ICD-10-CM | POA: Diagnosis not present

## 2023-04-04 DIAGNOSIS — Z1152 Encounter for screening for COVID-19: Secondary | ICD-10-CM | POA: Diagnosis not present

## 2023-04-04 DIAGNOSIS — Z79899 Other long term (current) drug therapy: Secondary | ICD-10-CM | POA: Insufficient documentation

## 2023-04-04 DIAGNOSIS — I25118 Atherosclerotic heart disease of native coronary artery with other forms of angina pectoris: Secondary | ICD-10-CM

## 2023-04-04 DIAGNOSIS — K219 Gastro-esophageal reflux disease without esophagitis: Secondary | ICD-10-CM | POA: Insufficient documentation

## 2023-04-04 DIAGNOSIS — R519 Headache, unspecified: Secondary | ICD-10-CM | POA: Diagnosis not present

## 2023-04-04 DIAGNOSIS — I517 Cardiomegaly: Secondary | ICD-10-CM | POA: Diagnosis not present

## 2023-04-04 DIAGNOSIS — Z86711 Personal history of pulmonary embolism: Secondary | ICD-10-CM | POA: Insufficient documentation

## 2023-04-04 DIAGNOSIS — Z8673 Personal history of transient ischemic attack (TIA), and cerebral infarction without residual deficits: Secondary | ICD-10-CM | POA: Diagnosis present

## 2023-04-04 DIAGNOSIS — R251 Tremor, unspecified: Secondary | ICD-10-CM | POA: Diagnosis present

## 2023-04-04 DIAGNOSIS — R29818 Other symptoms and signs involving the nervous system: Secondary | ICD-10-CM | POA: Diagnosis not present

## 2023-04-04 DIAGNOSIS — I639 Cerebral infarction, unspecified: Secondary | ICD-10-CM | POA: Diagnosis not present

## 2023-04-04 LAB — COMPREHENSIVE METABOLIC PANEL
ALT: 11 U/L (ref 0–44)
AST: 16 U/L (ref 15–41)
Albumin: 3.6 g/dL (ref 3.5–5.0)
Alkaline Phosphatase: 70 U/L (ref 38–126)
Anion gap: 10 (ref 5–15)
BUN: 15 mg/dL (ref 8–23)
CO2: 25 mmol/L (ref 22–32)
Calcium: 8.6 mg/dL — ABNORMAL LOW (ref 8.9–10.3)
Chloride: 105 mmol/L (ref 98–111)
Creatinine, Ser: 0.88 mg/dL (ref 0.44–1.00)
GFR, Estimated: >60 mL/min (ref >60)
Glucose, Bld: 150 mg/dL — ABNORMAL HIGH (ref 70–99)
Potassium: 3.6 mmol/L (ref 3.5–5.1)
Sodium: 140 mmol/L (ref 135–145)
Total Bilirubin: 0.4 mg/dL (ref 0.0–1.2)
Total Protein: 6.2 g/dL — ABNORMAL LOW (ref 6.5–8.1)

## 2023-04-04 LAB — RESP PANEL BY RT-PCR (RSV, FLU A&B, COVID)  RVPGX2
Influenza A by PCR: NEGATIVE
Influenza B by PCR: NEGATIVE
Resp Syncytial Virus by PCR: NEGATIVE
SARS Coronavirus 2 by RT PCR: NEGATIVE

## 2023-04-04 LAB — URINALYSIS, ROUTINE W REFLEX MICROSCOPIC
Bilirubin Urine: NEGATIVE
Glucose, UA: NEGATIVE mg/dL
Hgb urine dipstick: NEGATIVE
Ketones, ur: NEGATIVE mg/dL
Leukocytes,Ua: NEGATIVE
Nitrite: NEGATIVE
Protein, ur: NEGATIVE mg/dL
Specific Gravity, Urine: 1.021 (ref 1.005–1.030)
pH: 5 (ref 5.0–8.0)

## 2023-04-04 LAB — CBC WITH DIFFERENTIAL/PLATELET
Abs Immature Granulocytes: 0.02 10*3/uL (ref 0.00–0.07)
Basophils Absolute: 0 10*3/uL (ref 0.0–0.1)
Basophils Relative: 1 %
Eosinophils Absolute: 0.1 10*3/uL (ref 0.0–0.5)
Eosinophils Relative: 1 %
HCT: 30.8 % — ABNORMAL LOW (ref 36.0–46.0)
Hemoglobin: 9.6 g/dL — ABNORMAL LOW (ref 12.0–15.0)
Immature Granulocytes: 0 %
Lymphocytes Relative: 31 %
Lymphs Abs: 2.3 10*3/uL (ref 0.7–4.0)
MCH: 28.2 pg (ref 26.0–34.0)
MCHC: 31.2 g/dL (ref 30.0–36.0)
MCV: 90.3 fL (ref 80.0–100.0)
Monocytes Absolute: 0.5 10*3/uL (ref 0.1–1.0)
Monocytes Relative: 7 %
Neutro Abs: 4.6 10*3/uL (ref 1.7–7.7)
Neutrophils Relative %: 60 %
Platelets: 279 10*3/uL (ref 150–400)
RBC: 3.41 MIL/uL — ABNORMAL LOW (ref 3.87–5.11)
RDW: 16 % — ABNORMAL HIGH (ref 11.5–15.5)
WBC: 7.6 10*3/uL (ref 4.0–10.5)
nRBC: 0 % (ref 0.0–0.2)

## 2023-04-04 LAB — T4, FREE: Free T4: 0.95 ng/dL (ref 0.61–1.12)

## 2023-04-04 LAB — TSH: TSH: 7.164 u[IU]/mL — ABNORMAL HIGH (ref 0.350–4.500)

## 2023-04-04 LAB — TROPONIN I (HIGH SENSITIVITY)
Troponin I (High Sensitivity): 5 ng/L (ref <18)
Troponin I (High Sensitivity): 7 ng/L (ref <18)

## 2023-04-04 LAB — GLUCOSE, CAPILLARY: Glucose-Capillary: 149 mg/dL — ABNORMAL HIGH (ref 70–99)

## 2023-04-04 LAB — CBG MONITORING, ED: Glucose-Capillary: 90 mg/dL (ref 70–99)

## 2023-04-04 LAB — MAGNESIUM: Magnesium: 1.9 mg/dL (ref 1.7–2.4)

## 2023-04-04 LAB — CK: Total CK: 66 U/L (ref 38–234)

## 2023-04-04 LAB — BRAIN NATRIURETIC PEPTIDE: B Natriuretic Peptide: 146.1 pg/mL — ABNORMAL HIGH (ref 0.0–100.0)

## 2023-04-04 LAB — LACTIC ACID, PLASMA
Lactic Acid, Venous: 1.4 mmol/L (ref 0.5–1.9)
Lactic Acid, Venous: 0.8 mmol/L (ref 0.5–1.9)

## 2023-04-04 LAB — HEMOGLOBIN A1C
Hgb A1c MFr Bld: 6.4 % — ABNORMAL HIGH (ref 4.8–5.6)
Mean Plasma Glucose: 136.98 mg/dL

## 2023-04-04 MED ORDER — ACETAMINOPHEN 650 MG RE SUPP: 650.0000 mg | RECTAL | Status: DC | PRN

## 2023-04-04 MED ORDER — LORAZEPAM 2 MG/ML IJ SOLN: 0.5000 mg | Freq: Once | INTRAMUSCULAR | Status: DC | PRN

## 2023-04-04 MED ORDER — LORAZEPAM 2 MG/ML IJ SOLN
0.5000 mg | Freq: Once | INTRAMUSCULAR | Status: AC
  Administered 2023-04-04: 0.5 mg via INTRAVENOUS
  Filled 2023-04-04: qty 1

## 2023-04-04 MED ORDER — ACETAMINOPHEN 160 MG/5ML PO SOLN: 650.0000 mg | ORAL | Status: DC | PRN

## 2023-04-04 MED ORDER — IOHEXOL 350 MG/ML SOLN
75.0000 mL | Freq: Once | INTRAVENOUS | Status: AC | PRN
  Administered 2023-04-04: 75 mL via INTRAVENOUS

## 2023-04-04 MED ORDER — STROKE: EARLY STAGES OF RECOVERY BOOK: Freq: Once | Status: DC

## 2023-04-04 MED ORDER — IOHEXOL 350 MG/ML SOLN: 75.0000 mL | Freq: Once | INTRAVENOUS | Status: DC | PRN

## 2023-04-04 MED ORDER — STROKE: EARLY STAGES OF RECOVERY BOOK
Freq: Once | Status: AC
  Filled 2023-04-04: qty 1

## 2023-04-04 MED ORDER — SIMVASTATIN 20 MG PO TABS
20.0000 mg | ORAL_TABLET | Freq: Every day | ORAL | Status: DC
  Administered 2023-04-04 – 2023-04-05 (×2): 20 mg via ORAL
  Filled 2023-04-04 (×2): qty 1

## 2023-04-04 MED ORDER — EZETIMIBE 10 MG PO TABS
10.0000 mg | ORAL_TABLET | Freq: Every day | ORAL | Status: DC
  Administered 2023-04-04 – 2023-04-05 (×2): 10 mg via ORAL
  Filled 2023-04-04 (×2): qty 1

## 2023-04-04 MED ORDER — APIXABAN 2.5 MG PO TABS
2.5000 mg | ORAL_TABLET | Freq: Two times a day (BID) | ORAL | Status: DC
  Administered 2023-04-04 – 2023-04-06 (×4): 2.5 mg via ORAL
  Filled 2023-04-04 (×4): qty 1

## 2023-04-04 MED ORDER — POTASSIUM CHLORIDE CRYS ER 20 MEQ PO TBCR
20.0000 meq | EXTENDED_RELEASE_TABLET | Freq: Every day | ORAL | Status: AC
  Administered 2023-04-04 – 2023-04-05 (×2): 20 meq via ORAL
  Filled 2023-04-04 (×2): qty 1

## 2023-04-04 MED ORDER — PANTOPRAZOLE SODIUM 40 MG PO TBEC
40.0000 mg | DELAYED_RELEASE_TABLET | Freq: Two times a day (BID) | ORAL | Status: DC
  Administered 2023-04-04 – 2023-04-06 (×4): 40 mg via ORAL
  Filled 2023-04-04 (×4): qty 1

## 2023-04-04 MED ORDER — INSULIN ASPART 100 UNIT/ML IJ SOLN: 0.0000 [IU] | Freq: Every day | INTRAMUSCULAR | Status: DC

## 2023-04-04 MED ORDER — ACETAMINOPHEN 325 MG PO TABS
650.0000 mg | ORAL_TABLET | ORAL | Status: DC | PRN
  Administered 2023-04-04 – 2023-04-05 (×2): 650 mg via ORAL
  Filled 2023-04-04 (×2): qty 2

## 2023-04-04 MED ORDER — ALPRAZOLAM 0.25 MG PO TABS: 0.2500 mg | ORAL_TABLET | Freq: Every day | ORAL | Status: DC | PRN

## 2023-04-04 MED ORDER — ESCITALOPRAM OXALATE 10 MG PO TABS
5.0000 mg | ORAL_TABLET | Freq: Every evening | ORAL | Status: DC
Start: 2023-04-04 — End: 2023-04-06
  Administered 2023-04-04 – 2023-04-05 (×2): 5 mg via ORAL
  Filled 2023-04-04 (×2): qty 1

## 2023-04-04 MED ORDER — EZETIMIBE-SIMVASTATIN 10-20 MG PO TABS: 1.0000 | ORAL_TABLET | Freq: Every day | ORAL | Status: DC

## 2023-04-04 MED ORDER — INSULIN ASPART 100 UNIT/ML IJ SOLN: 0.0000 [IU] | Freq: Three times a day (TID) | INTRAMUSCULAR | Status: DC

## 2023-04-04 MED ORDER — LEVOTHYROXINE SODIUM 25 MCG PO TABS
125.0000 ug | ORAL_TABLET | Freq: Every day | ORAL | Status: DC
  Administered 2023-04-05 – 2023-04-06 (×2): 125 ug via ORAL
  Filled 2023-04-04 (×2): qty 1

## 2023-04-04 MED ORDER — LEVOTHYROXINE SODIUM 125 MCG PO CAPS: 125.0000 ug | ORAL_CAPSULE | Freq: Every day | ORAL | Status: DC

## 2023-04-04 MED ORDER — FUROSEMIDE 20 MG PO TABS
20.0000 mg | ORAL_TABLET | Freq: Every day | ORAL | Status: DC
  Administered 2023-04-04 – 2023-04-05 (×2): 20 mg via ORAL
  Filled 2023-04-04 (×3): qty 1

## 2023-04-04 NOTE — ED Notes (Signed)
Rn assisted pt to the restroom without incident

## 2023-04-04 NOTE — ED Notes (Signed)
HUG to [place] on 3W

## 2023-04-04 NOTE — H&P (Cosign Needed Addendum)
History and Physical    Patient: Desiree Leon ZOX:[id number] DOB: 07/28/1940 DOA: 04/04/2023 DOS: the patient was seen and examined on 04/04/2023 PCP: Geoffry Paradise, MD  Patient coming from: Home Medical readiness/disposition: Anticipate patient will be ready for discharge by 04/05/2023.  She will discharge back to home  Chief Complaint:  Chief Complaint  Patient presents with   Tremors   Cellulitis   HPI: Desiree Leon is a 83 y.o. female with medical history significant of mitral valve prolapse, hypothyroidism, diabetes mellitus 2, dyslipidemia, GERD, unstable angina and history of CAD status post stent to LAD and D2 in 2016, history of left leg sciatica, COPD, hypertension, CKD 2, and large bilateral PE with RV strain in 2023.  Also lower GI bleeding due to large colonic AVM in February 2024.  This was treated with APC and Uristat.  Patient has been experiencing left-sided weakness and tremors.  Symptoms became progressively worse and she eventually presented to the ED today for treatment.  MRI completed in the ER today revealed an acute/subacute 18 mm infarct of the left superior cerebellum.  Neurology has been consulted and the hospitalist service has been asked to evaluate the patient for admission.  In addition to the above neurological symptoms patient has also been experiencing dizziness and nausea and a right-sided headache.  She has been having redness of the bilateral lower extremities right greater than left along with progressive worsening of edema in the legs right greater than left which she has been treating with elevation and as needed Lasix.  She denies any further GI bleeding symptoms or any dark stools.  Review of Systems: As mentioned in the history of present illness. All other systems reviewed and are negative. Past Medical History:  Diagnosis Date   Anxiety    Blood transfusion    CAP (community acquired pneumonia)    COPD (chronic obstructive pulmonary disease)  (HCC)    Heart murmur    History of colon polyps    Hyperlipidemia    Hypertension    MI (myocardial infarction) (HCC)    Mitral valve prolapse    NSTEMI (non-ST elevated myocardial infarction) (HCC)    Osteoarthritis    Osteoporosis    Pulmonary emboli (HCC) 04/12/2019   Spinal stenosis    Thyroid disease    hypothyroidism   Unstable angina (HCC) 05/08/2014   Venous insufficiency    Past Surgical History:  Procedure Laterality Date   ABDOMINAL HYSTERECTOMY  11/26/1976   APPENDECTOMY  02/14/1978   BASAL CELL CARCINOMA EXCISION  01/26/1990   COLONOSCOPY  1993, 2003, 2008, 2012   COLONOSCOPY WITH PROPOFOL N/A 04/26/2022   Procedure: COLONOSCOPY WITH PROPOFOL;  Surgeon: Kathi Der, MD;  Location: [facility] ENDOSCOPY;  Service: Gastroenterology;  Laterality: N/A;   DILATION AND CURETTAGE OF UTERUS  09/1976   ESOPHAGOGASTRODUODENOSCOPY (EGD) WITH PROPOFOL N/A 04/25/2022   Procedure: ESOPHAGOGASTRODUODENOSCOPY (EGD) WITH PROPOFOL;  Surgeon: Willis Modena, MD;  Location: [facility] ENDOSCOPY;  Service: Gastroenterology;  Laterality: N/A;   HEMOSTASIS CONTROL  04/26/2022   Procedure: HEMOSTASIS CONTROL;  Surgeon: Kathi Der, MD;  Location: [facility] ENDOSCOPY;  Service: Gastroenterology;;   HOT HEMOSTASIS N/A 04/26/2022   Procedure: HOT HEMOSTASIS (ARGON PLASMA COAGULATION/BICAP);  Surgeon: Kathi Der, MD;  Location: [facility] ENDOSCOPY;  Service: Gastroenterology;  Laterality: N/A;   LEFT HEART CATHETERIZATION WITH CORONARY ANGIOGRAM N/A 04/18/2014   Procedure: LEFT HEART CATHETERIZATION WITH CORONARY ANGIOGRAM;  Surgeon: Peter M Swaziland, MD;  Location: [facility] CATH LAB;  Service: Cardiovascular;  Laterality: N/A;  MASS EXCISION  10/20/2011   Procedure: MINOR EXCISION OF MASS;  Surgeon: Ernestene Mention, MD;  Location: [facility];  Service: General;  Laterality: N/A;  Excision mass posterior neck   OOPHORECTOMY  02/14/1978   POLYPECTOMY  04/26/2022   Procedure: POLYPECTOMY;  Surgeon:  Kathi Der, MD;  Location: [facility] ENDOSCOPY;  Service: Gastroenterology;;   SHOULDER SURGERY  10/18/2007   basal cell   SQUAMOUS CELL CARCINOMA EXCISION     TONSILLECTOMY  1955   TUBAL LIGATION  07/08/74   Social History:  reports that she quit smoking about 18 years ago. Her smoking use included cigarettes. She started smoking about 48 years ago. She has a 15 pack-year smoking history. She has never used smokeless tobacco. She reports that she does not drink alcohol and does not use drugs.  Allergies  Allergen Reactions   Other Anaphylaxis and Cough    fragarances-perfumes   Dilaudid [Hydromorphone Hcl] Nausea Only   Erythromycin Other (See Comments)   Diflucan [Fluconazole] Palpitations and Hypertension    When taken with Nicardipine, reaction happens.    Family History  Problem Relation Age of Onset   Heart disease Mother    Heart disease Father    Cancer Sister        breast   Cancer Daughter        cervical and bone   Heart disease Sister    Thyroid disease Sister     Prior to Admission medications   Medication Sig Start Date End Date Taking? Authorizing Provider  acetaminophen (TYLENOL) 500 MG tablet Take 500 mg by mouth daily as needed for fever or headache.   Yes [provider]  ALPRAZolam (XANAX) 0.25 MG tablet Take 0.25 mg by mouth daily as needed for anxiety.   Yes [provider]  apixaban (ELIQUIS) 2.5 MG TABS tablet Take 1 tablet (2.5 mg total) by mouth 2 (two) times daily. 05/19/22  Yes Yates Decamp, MD  budesonide-formoterol [facility]) 160-4.5 MCG/ACT inhaler Inhale 2 puffs into the lungs 2 (two) times daily. 06/24/21  Yes Martina Sinner, MD  cholecalciferol (VITAMIN D) 25 MCG (1000 UNIT) tablet Take 2,000 Units by mouth daily.   Yes [provider]  COLLAGEN PO Take 10 g by mouth daily.   Yes [provider]  escitalopram (LEXAPRO) 5 MG tablet Take 5 mg by mouth every evening.    Yes [provider]  furosemide  (LASIX) 20 MG tablet Take 20 mg by mouth daily.   Yes [provider]  Levothyroxine Sodium 125 MCG CAPS Take 125 mcg by mouth daily before breakfast. 08/25/11  Yes [provider]  methocarbamol (ROBAXIN) 500 MG tablet Take 500 mg by mouth daily as needed for muscle spasms. 09/26/19  Yes [provider]  metoprolol tartrate (LOPRESSOR) 25 MG tablet Take 25 mg by mouth 2 (two) times daily.  09/12/11  Yes [provider]  multivitamin-iron-minerals-folic acid (CENTRUM) chewable tablet Chew 1 tablet by mouth daily.   Yes [provider]  niCARdipine (CARDENE) 20 MG capsule Take 1 capsule (20 mg total) by mouth every evening. 05/19/22  Yes Yates Decamp, MD  nitroGLYCERIN (NITRODUR - DOSED IN MG/24 HR) 0.4 mg/hr patch Place 0.4 mg onto the skin daily.   Yes [provider]  nitroGLYCERIN (NITROSTAT) 0.4 MG SL tablet Place 1 tablet (0.4 mg total) under the tongue every 5 (five) minutes as needed for chest pain. 05/28/21  Yes Yates Decamp, MD  olopatadine (PATADAY) 0.1 %  ophthalmic solution Place 1 drop into both eyes 2 (two) times daily.   Yes [provider]  oxyCODONE (OXY IR/ROXICODONE) 5 MG immediate release tablet Take 5 mg by mouth daily as needed for severe pain.   Yes [provider]  OXYGEN Inhale 2.5 L into the lungs at bedtime.   Yes [provider]  pantoprazole (PROTONIX) 40 MG tablet Take 1 tablet (40 mg total) by mouth daily at 12 noon. Patient taking differently: Take 40 mg by mouth 2 (two) times daily. 04/21/14  Yes Vassie Loll, MD  Probiotic Product (ALIGN) 4 MG CAPS Take 4 mg by mouth daily.   Yes [provider]  ranolazine (RANEXA) 500 MG 12 hr tablet Take 1 tablet (500 mg total) by mouth 2 (two) times daily. 05/19/22  Yes Yates Decamp, MD  triamcinolone cream (KENALOG) 0.1 % Apply 1 Application topically 2 (two) times daily. On both legs.   Yes [provider]  VYTORIN 10-20 MG per tablet Take 1  tablet by mouth daily at 6 PM.  09/18/11  Yes [provider]  glucose blood test strip Accu-Chek SmartView Test Strips  CHECK BLOOD SUGAR TWICE DAILY.    [provider]  fluticasone (FLONASE) 50 MCG/ACT nasal spray Place 2 sprays into both nostrils daily. 04/04/19 09/12/19  [provider]    Physical Exam: Vitals:   04/04/23 1128 04/04/23 1630 04/04/23 1633 04/04/23 1645  BP:  136/62  (!) 147/51  Pulse:  (!) 53  (!) 49  Resp:  18  15  Temp:   99.3 F (37.4 C)   TempSrc:   Oral   SpO2:  100%  100%  Weight: 84.4 kg     Height: 5\' 3"  (1.6 m)      Constitutional: NAD, calm, comfortable Respiratory: clear to auscultation bilaterally, no wheezing, no crackles. Normal respiratory effort. No accessory muscle use. 2.5 L O2  Cardiovascular: Regular rate and rhythm, no murmurs / rubs / gallops. No extremity edema. 2+ pedal pulses. No carotid bruits.  Abdomen: no tenderness, no masses palpated. No hepatosplenomegaly. Bowel sounds positive.  Musculoskeletal: no clubbing / cyanosis. No joint deformity upper and lower extremities. Good ROM, no contractures. Normal muscle tone.  Skin: no rashes, lesions, ulcers. No induration Red skin changes R>L c/w stasis dermatitis Neurologic: CN 2-12 grossly intact. Sensation intact, DTR normal. Strength 5/5 on right side.  4+/5 on the left.  Notes some weakness in right upper extremity with distal flexion strength.  Subtle distance with distal extension strength of foot. Psychiatric: Normal judgment and insight. Alert and oriented x 3. Normal mood.     Data Reviewed:  Sodium 140, potassium 3.6, chloride 105, CO2 25, glucose 150, BUN 15, creatinine 0.88, anion gap 10, magnesium 1.9, LFTs are normal, GFR greater than 60  BNP 146.1  Total CK 66  Troponin 5  Lactic acid 1.4  WBC 7600 with normal differential, hemoglobin 9.6, platelets 279,000  PCR for influenza, COVID and RSV negative  Urine unremarkable  Blood cultures have  been drawn and are pending  MRI as above  CTA head and neck pending  Assessment and Plan: Acute/subacute nonhemorrhagic infarct of the left superior cerebellum Appreciate assistance of the neurology team Follow-up on CTA head and neck Echocardiogram Hemoglobin A1c and lipid panel PT/OT/SLP evaluations On low-dose Eliquis prior to admission.  History of recent GI bleed February 2024 due to large colonic AVM.  Will hold off on low-dose aspirin for now Permissive hypertension  Asthma with  chronic hypoxemia Continue home nasal cannula O2  CAD with prior stent to LAD and OM 2 Continue Vytorin 10/20 Has history of unstable angina but with need for permissive hypertension will hold Lopressor and Ranexa as well as nicardipine  History of PE in 2023 with RV strain Continue Eliquis  Lower GI bleeding 2024 secondary to large colonic AVM Status post APC and Uristat Has been stable on low-dose Eliquis Not initiate low-dose aspirin at this juncture  LE edema/stasis dermatitis Lasix 20 mg daily with Kdur  Hypothyroidism Current TSH is 7.164 Continue Synthroid-patient states she takes this medication on an empty stomach Follow-up TSH at PCP office at their discretion  Diabetes mellitus 2 Diet controlled Follow-up on hemoglobin A1c Follow CBGs and provide SSI  HLD Continue Vytorin  GERD Continue PPI   Advance Care Planning:   Code Status: Full Code   VTE prophylaxis: Eliquis  Consults: Neurology  Family Communication: Daughter at bedside  Severity of Illness: The appropriate patient status for this patient is OBSERVATION. Observation status is judged to be reasonable and necessary in order to provide the required intensity of service to ensure the patient's safety. The patient's presenting symptoms, physical exam findings, and initial radiographic and laboratory data in the context of their medical condition is felt to place them at decreased risk for further clinical  deterioration. Furthermore, it is anticipated that the patient will be medically stable for discharge from the hospital within 2 midnights of admission.   Author: Junious Silk, NP 04/04/2023 5:00 PM  For on call review [email or web address].

## 2023-04-04 NOTE — ED Provider Notes (Signed)
[place] [facility] AT [facility] Provider Note   CSN: [id number] Arrival date & time: 04/04/23  1119     History  Chief Complaint  Patient presents with   Tremors   Cellulitis    Desiree Leon is a 83 y.o. female.  The history is provided by the patient and medical records. No language interpreter was used.  Illness Location:  Dizziness, headache, twitching, left leg and left arm weakness, right leg swelling and redness Severity:  Unable to specify Duration:  1 week Timing:  Constant Progression:  Waxing and waning Chronicity:  New Associated symptoms: fatigue, headaches and nausea   Associated symptoms: no abdominal pain, no chest pain, no congestion, no cough, no diarrhea, no fever, no loss of consciousness, no rash, no shortness of breath, no vomiting and no wheezing        Home Medications Prior to Admission medications   Medication Sig Start Date End Date Taking? Authorizing Provider  acetaminophen (TYLENOL) 500 MG tablet Take 500 mg by mouth daily as needed for fever or headache.    [provider]  ALPRAZolam Prudy Feeler) 0.25 MG tablet Take 0.25 mg by mouth daily as needed for anxiety.    [provider]  amoxicillin-clavulanate (AUGMENTIN) 875-125 MG tablet Take 1 tablet by mouth every 12 (twelve) hours. 06/23/22   Lonell Grandchild, MD  apixaban (ELIQUIS) 2.5 MG TABS tablet Take 1 tablet (2.5 mg total) by mouth 2 (two) times daily. 05/19/22   Yates Decamp, MD  budesonide-formoterol [facility]) 160-4.5 MCG/ACT inhaler Inhale 2 puffs into the lungs 2 (two) times daily. 06/24/21   Martina Sinner, MD  cholecalciferol (VITAMIN D) 25 MCG (1000 UNIT) tablet Take 2,000 Units by mouth daily.    [provider]  COLLAGEN PO Take 10 g by mouth daily.    [provider]  escitalopram (LEXAPRO) 5 MG tablet Take 5 mg by mouth every evening.     [provider]  furosemide (LASIX) 20 MG tablet Take 20 mg by mouth  daily.    [provider]  glucose blood test strip Accu-Chek SmartView Test Strips  CHECK BLOOD SUGAR TWICE DAILY.    [provider]  iron polysaccharides (NIFEREX) 150 MG capsule Take 150 mg by mouth daily.    [provider]  levalbuterol Pauline Aus [facility]FA) 45 MCG/ACT inhaler Inhale 2 puffs into the lungs every 4 (four) hours as needed for wheezing. 06/24/21 06/24/22  Martina Sinner, MD  Levothyroxine Sodium 125 MCG CAPS Take 125 mcg by mouth daily before breakfast. 08/25/11   [provider]  methocarbamol (ROBAXIN) 500 MG tablet Take 500 mg by mouth daily as needed for muscle spasms. 09/26/19   [provider]  metoprolol tartrate (LOPRESSOR) 25 MG tablet Take 25 mg by mouth 2 (two) times daily.  09/12/11   [provider]  multivitamin-iron-minerals-folic acid (CENTRUM) chewable tablet Chew 1 tablet by mouth daily.    [provider]  niCARdipine (CARDENE) 20 MG capsule Take 1 capsule (20 mg total) by mouth every evening. 05/19/22   Yates Decamp, MD  nitroGLYCERIN (NITRODUR - DOSED IN MG/24 HR) 0.4 mg/hr patch Place 0.4 mg onto the skin daily.    [provider]  nitroGLYCERIN (NITROSTAT) 0.4 MG SL tablet Place 1 tablet (0.4 mg total) under the tongue every 5 (five) minutes as needed for chest pain. 05/28/21   Yates Decamp, MD  oxyCODONE (OXY IR/ROXICODONE) 5 MG immediate release tablet Take 5 mg by  mouth daily as needed for severe pain.    [provider]  OXYGEN Inhale 2.5 L into the lungs at bedtime.    [provider]  pantoprazole (PROTONIX) 40 MG tablet Take 1 tablet (40 mg total) by mouth daily at 12 noon. Patient taking differently: Take 40 mg by mouth 2 (two) times daily. 04/21/14   Vassie Loll, MD  Polyethyl Glycol-Propyl Glycol (SYSTANE) 0.4-0.3 % SOLN Place 1 drop into both eyes daily.    [provider]  polyethylene glycol powder (GLYCOLAX/MIRALAX) 17 GM/SCOOP powder Take 17 g by mouth daily  as needed for mild constipation. 06/23/22   Lonell Grandchild, MD  Probiotic Product (ALIGN) 4 MG CAPS Take 4 mg by mouth daily.    [provider]  ranolazine (RANEXA) 500 MG 12 hr tablet Take 1 tablet (500 mg total) by mouth 2 (two) times daily. 05/19/22   Yates Decamp, MD  VYTORIN 10-20 MG per tablet Take 1 tablet by mouth daily at 6 PM.  09/18/11   [provider]  fluticasone (FLONASE) 50 MCG/ACT nasal spray Place 2 sprays into both nostrils daily. 04/04/19 09/12/19  [provider]      Allergies    Other, Dilaudid [hydromorphone hcl], and Erythromycin    Review of Systems   Review of Systems  Constitutional:  Positive for fatigue. Negative for chills and fever.  HENT:  Negative for congestion.   Eyes:  Positive for visual disturbance (transient). Negative for pain.  Respiratory:  Negative for cough, chest tightness, shortness of breath and wheezing.   Cardiovascular:  Negative for chest pain.  Gastrointestinal:  Positive for nausea. Negative for abdominal pain, constipation, diarrhea and vomiting.  Genitourinary:  Negative for dysuria, flank pain and frequency.  Musculoskeletal:  Negative for back pain, neck pain and neck stiffness.  Skin:  Positive for color change. Negative for rash.  Neurological:  Positive for dizziness, weakness (per pt transient) and headaches. Negative for loss of consciousness, speech difficulty and numbness.  Psychiatric/Behavioral:  Negative for agitation and confusion.   All other systems reviewed and are negative.   Physical Exam Updated Vital Signs BP (!) 148/69 (BP Location: Right Arm)   Pulse (!) 56   Temp 99.3 F (37.4 C) (Oral)   Resp 18   Ht 5\' 3"  (1.6 m)   Wt 84.4 kg   SpO2 100%   BMI 32.95 kg/m  Physical Exam Vitals and nursing note reviewed.  Constitutional:      General: She is not in acute distress.    Appearance: She is well-developed. She is not ill-appearing, toxic-appearing or diaphoretic.  HENT:      Head: Normocephalic and atraumatic.     Nose: No congestion or rhinorrhea.     Mouth/Throat:     Mouth: Mucous membranes are moist.     Pharynx: No oropharyngeal exudate or posterior oropharyngeal erythema.  Eyes:     Extraocular Movements: Extraocular movements intact.     Conjunctiva/sclera: Conjunctivae normal.     Pupils: Pupils are equal, round, and reactive to light.  Cardiovascular:     Rate and Rhythm: Normal rate and regular rhythm.     Heart sounds: No murmur heard. Pulmonary:     Effort: Pulmonary effort is normal. No respiratory distress.     Breath sounds: Normal breath sounds. No wheezing, rhonchi or rales.  Chest:     Chest wall: No tenderness.  Abdominal:     General: Abdomen is flat.     Palpations:  Abdomen is soft.     Tenderness: There is no abdominal tenderness. There is no right CVA tenderness, left CVA tenderness, guarding or rebound.  Musculoskeletal:        General: Swelling and tenderness present.     Cervical back: Neck supple.     Right lower leg: Edema present.     Left lower leg: No edema.  Skin:    General: Skin is warm and dry.     Capillary Refill: Capillary refill takes less than 2 seconds.     Findings: Erythema present. No rash.  Neurological:     Mental Status: She is alert.     Cranial Nerves: No cranial nerve deficit.     Sensory: No sensory deficit.     Motor: No weakness.     Coordination: Coordination abnormal.  Psychiatric:        Mood and Affect: Mood normal.     ED Results / Procedures / Treatments   Labs (all labs ordered are listed, but only abnormal results are displayed) Labs Reviewed  CBC WITH DIFFERENTIAL/PLATELET - Abnormal; Notable for the following components:      Result Value   RBC 3.41 (*)    Hemoglobin 9.6 (*)    HCT 30.8 (*)    RDW 16.0 (*)    All other components within normal limits  COMPREHENSIVE METABOLIC PANEL - Abnormal; Notable for the following components:   Glucose, Bld 150 (*)    Calcium 8.6 (*)     Total Protein 6.2 (*)    All other components within normal limits  BRAIN NATRIURETIC PEPTIDE - Abnormal; Notable for the following components:   B Natriuretic Peptide 146.1 (*)    All other components within normal limits  TSH - Abnormal; Notable for the following components:   TSH 7.164 (*)    All other components within normal limits  RESP PANEL BY RT-PCR (RSV, FLU A&B, COVID)  RVPGX2  CULTURE, BLOOD (ROUTINE X 2)  CULTURE, BLOOD (ROUTINE X 2)  LACTIC ACID, PLASMA  MAGNESIUM  CK  URINALYSIS, ROUTINE W REFLEX MICROSCOPIC  LACTIC ACID, PLASMA  TROPONIN I (HIGH SENSITIVITY)  TROPONIN I (HIGH SENSITIVITY)    EKG EKG Interpretation Date/Time:  Saturday April 04 2023 11:27:00 EST Ventricular Rate:  64 PR Interval:  194 QRS Duration:  98 QT Interval:  454 QTC Calculation: 468 R Axis:   65  Text Interpretation: Normal sinus rhythm Normal ECG When compared with ECG of 23-Jun-2022 11:04, PREVIOUS ECG IS PRESENT when compared to prior, similar appearance. No STEMI Confirmed by Theda Belfast ([id number]) on 04/04/2023 12:07:29 PM  Radiology DG Chest 2 View Result Date: 04/04/2023 CLINICAL DATA:  Shortness of breath. EXAM: CHEST - 2 VIEW COMPARISON:  One-view chest x-ray 06/23/2022 FINDINGS: Heart is enlarged. Atherosclerotic changes are present at the aortic arch. Changes of COPD are present. No edema or effusion is present. No focal airspace disease is present. Thoracic kyphosis is mildly exaggerated. No acute osseous abnormalities are present. IMPRESSION: 1. Cardiomegaly without failure. 2. COPD. Electronically Signed   By: Marin Roberts M.D.   On: 04/04/2023 13:15    Procedures Procedures    Medications Ordered in ED Medications  LORazepam (ATIVAN) injection 0.5 mg (has no administration in time range)  LORazepam (ATIVAN) injection 0.5 mg (0.5 mg Intravenous Given 04/04/23 1355)    ED Course/ Medical Decision Making/ A&P  Medical  Decision Making Amount and/or Complexity of Data Reviewed Labs: ordered. Radiology: ordered.  Risk Prescription drug management.    GEORGANNE SIPLE is a 83 y.o. female with a past medical history significant for hypertension, diabetes, hypothyroidism, previous pulmonary embolism on Eliquis therapy, previous GI bleeding, osteoporosis, CAD with previous MI, COPD, hyperlipidemia, previous carotid disease, and mitral valve prolapse who presents with multiple complaints including 1 week of dizziness and arm twitching, transient vision changes, transient left arm and left leg weakness, and gradually worsening redness and swelling in her right leg where she recently had a procedure on.  She reports that she is having more redness and swelling and discomfort in her right shin laterally where she had some skin removed.  She is concerned it could be infected.  She has had history of blood clots and has been on blood thinners and does not think she has had a clot in her leg there.  She denies any ankle pain or new trauma.  She denies abdominal pain or chest pain and denies any fevers or chills.  She denies any cough worse at her baseline.  She reports for the last week she has had new headache in her right temporal area and has had flashes in her vision that come and go but those are more chronic.  They do not seem to be associated with her headache she reports.  She denies any current nausea or vomiting and denies any chest pain or palpitations.  She thinks that her left arm and left leg have been more weak intermittently but she has had some abnormal twitching and shaking in her all extremities that she cannot explain.  She also has been having room spinning dizziness.  On exam, lungs were clear.  Chest nontender.  Abdomen nontender.  She does have bilateral carotid bruits.  Neck was nontender.   She did not have any temporal tenderness on exam.  She had a symmetric smile.  On my exam, she had symmetric strength  and sensation in extremities however she did have some difficulty with finger-nose-finger testing bilaterally that she reports is slightly worse than her baseline.  She did not have any shaking or twitching or seizure-like episodes during my exam.  Right leg is slightly erythematous and tender and warm around a surgical site on her right lateral calf.  Distally she has intact strength, sensation, and pulses.  No crepitance.  Knee nontender.  Given the patient's leg symptoms, we will get screening labs to look for evidence of systemic infection and will get ultrasound to rule out DVT.  Given her dizziness and carotid disease with bruits, she will get CTA head and neck.  Given the persistent twitching episodes and this dizzy sensation and her intermittent left-sided symptoms, we will get MRI brain.  Will get workup for other causes of fatigue as well to look for occult infection with urine and chest x-ray and labs.  MRI was completed and I looked at the images and of concern for a left cerebellar stroke.  Neurology will come see the patient for this new stroke.  Ultimately she will need admission when workup is completed but she will need the CTA and other labs to complete.  She will be admitted to medicine after workup is completed.  Care transferred to oncoming team to wait for workup to complete.         Final Clinical Impression(s) / ED Diagnoses Final diagnoses:  Cerebrovascular accident (CVA), unspecified mechanism (HCC)  Dizziness  Transient vision disturbance  Leg erythema    Clinical Impression: 1. Cerebrovascular accident (CVA), unspecified mechanism (HCC)   2. Dizziness   3. Transient vision disturbance   4. Leg erythema     Disposition: Admit  This note was prepared with assistance of Dragon voice recognition software. Occasional wrong-word or sound-a-like substitutions may have occurred due to the inherent limitations of voice recognition software.     Dekendrick Uzelac,  Canary Brim, MD 04/04/23 [phone]

## 2023-04-04 NOTE — ED Triage Notes (Addendum)
Patient brought in after starting to have involuntary movements yesterday with head twitching and extremities.  Patient has long cardiac hx and complains of intermittent palpitations.  Patient is always SOB on 3L [place] baseline.  Patient complains of sharp intermittent pains in right side of head. Reports when her head has the involuntary movements she gets dizzy and nauseated.  Patient also has right leg swelling and redness that has increased.  Tender to touch.  Had skin cancer removed a couple months ago and it has not been healing correctly.  Patient appears weaker on left side and left leg keeps giving out.  No new medication changes.  Grip equal but right leg weaker than left unknown if due to pain from swelling and cellulitis.

## 2023-04-04 NOTE — Consult Note (Addendum)
NEUROLOGY CONSULT NOTE   Date of service: April 04, 2023 Patient Name: Desiree Leon MRN:  [id number] DOB:  [birth year] Chief Complaint: "Dizziness" Requesting Provider: Jacalyn Lefevre, MD  History of Present Illness  COLE KLUGH is a 83 y.o. female  has a past medical history of Anxiety, Blood transfusion, CAP (community acquired pneumonia), COPD (chronic obstructive pulmonary disease) (HCC), Heart murmur, History of colon polyps, Hyperlipidemia, Hypertension, MI (myocardial infarction) (HCC), Mitral valve prolapse, NSTEMI (non-ST elevated myocardial infarction) (HCC), Osteoarthritis, Osteoporosis, Pulmonary emboli (HCC) (04/12/2019), Spinal stenosis, Thyroid disease, Unstable angina (HCC) (05/08/2014), and Venous insufficiency. who presents with  dizziness, headache, twitching, left leg and left arm weakness.  She states that her symptoms initially started on Monday with her feeling like she was stumbling, dizziness, room spinning.  She did not have any falls or hit her head.  She did miss a day of her Eliquis at the beginning of the month because the pharmacy ran out and she was unable to get it.  She has not missed any medication since then.  At this time she does endorse a headache  On review of chart notes she had a lower GI bleed requiring more than 4 units of blood and 24 hours in the ICU 04/23/2022 --she had multiple large AVMs AVM treated with APC and Uristat.  On follow-up with her cardiologist 05/19/2022 decision was made to reduce her Eliquis from 5 mg twice daily to 2.5 mg twice daily; she was requiring iron infusions to maintain blood counts  1a Level of Conscious.: 0 1b LOC Questions: 0 1c LOC Commands: 0 2 Best Gaze: 0 3 Visual: 0 4 Facial Palsy: 0 5a Motor Arm - left: 0 5b Motor Arm - Right: 0 6a Motor Leg - Left: 0 6b Motor Leg - Right: 0 7 Limb Ataxia: 1 8 Sensory: 0 9 Best Language: 0 10 Dysarthria: 0 11 Extinct. and Inatten.: 0 TOTAL: 0   ROS  Comprehensive  ROS performed and pertinent positives documented in HPI    Past History   Past Medical History:  Diagnosis Date   Anxiety    Blood transfusion    CAP (community acquired pneumonia)    COPD (chronic obstructive pulmonary disease) (HCC)    Heart murmur    History of colon polyps    Hyperlipidemia    Hypertension    MI (myocardial infarction) (HCC)    Mitral valve prolapse    NSTEMI (non-ST elevated myocardial infarction) (HCC)    Osteoarthritis    Osteoporosis    Pulmonary emboli (HCC) 04/12/2019   Spinal stenosis    Thyroid disease    hypothyroidism   Unstable angina (HCC) 05/08/2014   Venous insufficiency     Past Surgical History:  Procedure Laterality Date   ABDOMINAL HYSTERECTOMY  11/26/1976   APPENDECTOMY  02/14/1978   BASAL CELL CARCINOMA EXCISION  01/26/1990   COLONOSCOPY  1993, 2003, 2008, 2012   COLONOSCOPY WITH PROPOFOL N/A 04/26/2022   Procedure: COLONOSCOPY WITH PROPOFOL;  Surgeon: Kathi Der, MD;  Location: [facility] END[facility]COPY;  Service: Gastroenterology;  Laterality: N/A;   DILATION AND CURETTAGE OF UTERUS  09/1976   ESOPHAGOGASTRODUODENOSCOPY (EGD) WITH PROPOFOL N/A 04/25/2022   Procedure: ESOPHAGOGASTRODUODENOSCOPY (EGD) WITH PROPOFOL;  Surgeon: Willis Modena, MD;  Location: [facility] ENDOSCOPY;  Service: Gastroenterology;  Laterality: N/A;   HEMOSTASIS CONTROL  04/26/2022   Procedure: HEMOSTASIS CONTROL;  Surgeon: Kathi Der, MD;  Location: [facility] ENDOSCOPY;  Service: Gastroenterology;;   HOT HEMOSTASIS N/A 04/26/2022   Procedure: HOT  HEMOSTASIS (ARGON PLASMA COAGULATION/BICAP);  Surgeon: Kathi Der, MD;  Location: [facility] ENDOSCOPY;  Service: Gastroenterology;  Laterality: N/A;   LEFT HEART CATHETERIZATION WITH CORONARY ANGIOGRAM N/A 04/18/2014   Procedure: LEFT HEART CATHETERIZATION WITH CORONARY ANGIOGRAM;  Surgeon: Peter M Swaziland, MD;  Location: [facility] CATH LAB;  Service: Cardiovascular;  Laterality: N/A;   MASS EXCISION  10/20/2011   Procedure: MINOR EXCISION OF  MASS;  Surgeon: Ernestene Mention, MD;  Location: [facility];  Service: General;  Laterality: N/A;  Excision mass posterior neck   OOPHORECTOMY  02/14/1978   POLYPECTOMY  04/26/2022   Procedure: POLYPECTOMY;  Surgeon: Kathi Der, MD;  Location: [facility];  Service: Gastroenterology;;   SHOULDER SURGERY  10/18/2007   basal cell   SQUAMOUS CELL CARCINOMA EXCISION     TONSILLECTOMY  1955   TUBAL LIGATION  07/08/74    Family History: Family History  Problem Relation Age of Onset   Heart disease Mother    Heart disease Father    Cancer Sister        breast   Cancer Daughter        cervical and bone   Heart disease Sister    Thyroid disease Sister     Social History  reports that she quit smoking about 18 years ago. Her smoking use included cigarettes. She started smoking about 48 years ago. She has a 15 pack-year smoking history. She has never used smokeless tobacco. She reports that she does not drink alcohol and does not use drugs.  Allergies  Allergen Reactions   Other Anaphylaxis and Cough    fragarances-perfumes   Dilaudid [Hydromorphone Hcl] Nausea Only   Erythromycin Other (See Comments)    Medications   Current Facility-Administered Medications:    LORazepam (ATIVAN) injection 0.5 mg, 0.5 mg, Intravenous, Once PRN, Tegeler, Canary Brim, MD  Current Outpatient Medications:    acetaminophen (TYLENOL) 500 MG tablet, Take 500 mg by mouth daily as needed for fever or headache., Disp: , Rfl:    ALPRAZolam (XANAX) 0.25 MG tablet, Take 0.25 mg by mouth daily as needed for anxiety., Disp: , Rfl:    amoxicillin-clavulanate (AUGMENTIN) 875-125 MG tablet, Take 1 tablet by mouth every 12 (twelve) hours., Disp: 14 tablet, Rfl: 0   apixaban (ELIQUIS) 2.5 MG TABS tablet, Take 1 tablet (2.5 mg total) by mouth 2 (two) times daily., Disp: 180 tablet, Rfl: 3   budesonide-formoterol (SYMBICORT) 160-4.5 MCG/ACT inhaler, Inhale 2 puffs into the lungs 2 (two) times  daily., Disp: 1 each, Rfl: 12   cholecalciferol (VITAMIN D) 25 MCG (1000 UNIT) tablet, Take 2,000 Units by mouth daily., Disp: , Rfl:    COLLAGEN PO, Take 10 g by mouth daily., Disp: , Rfl:    escitalopram (LEXAPRO) 5 MG tablet, Take 5 mg by mouth every evening. , Disp: , Rfl:    furosemide (LASIX) 20 MG tablet, Take 20 mg by mouth daily., Disp: , Rfl:    glucose blood test strip, Accu-Chek SmartView Test Strips  CHECK BLOOD SUGAR TWICE DAILY., Disp: , Rfl:    iron polysaccharides (NIFEREX) 150 MG capsule, Take 150 mg by mouth daily., Disp: , Rfl:    levalbuterol (XOPENEX HFA) 45 MCG/ACT inhaler, Inhale 2 puffs into the lungs every 4 (four) hours as needed for wheezing., Disp: 1 each, Rfl: 2   Levothyroxine Sodium 125 MCG CAPS, Take 125 mcg by mouth daily before breakfast., Disp: , Rfl:    methocarbamol (ROBAXIN) 500 MG tablet, Take 500 mg by  mouth daily as needed for muscle spasms., Disp: , Rfl:    metoprolol tartrate (LOPRESSOR) 25 MG tablet, Take 25 mg by mouth 2 (two) times daily. , Disp: , Rfl:    multivitamin-iron-minerals-folic acid (CENTRUM) chewable tablet, Chew 1 tablet by mouth daily., Disp: , Rfl:    niCARdipine (CARDENE) 20 MG capsule, Take 1 capsule (20 mg total) by mouth every evening., Disp: 90 capsule, Rfl: 3   nitroGLYCERIN (NITRODUR - DOSED IN MG/24 HR) 0.4 mg/hr patch, Place 0.4 mg onto the skin daily., Disp: , Rfl:    nitroGLYCERIN (NITROSTAT) 0.4 MG SL tablet, Place 1 tablet (0.4 mg total) under the tongue every 5 (five) minutes as needed for chest pain., Disp: 30 tablet, Rfl: 0   oxyCODONE (OXY IR/ROXICODONE) 5 MG immediate release tablet, Take 5 mg by mouth daily as needed for severe pain., Disp: , Rfl:    OXYGEN, Inhale 2.5 L into the lungs at bedtime., Disp: , Rfl:    pantoprazole (PROTONIX) 40 MG tablet, Take 1 tablet (40 mg total) by mouth daily at 12 noon. (Patient taking differently: Take 40 mg by mouth 2 (two) times daily.), Disp: 30 tablet, Rfl: 1   Polyethyl  Glycol-Propyl Glycol (SYSTANE) 0.4-0.3 % SOLN, Place 1 drop into both eyes daily., Disp: , Rfl:    polyethylene glycol powder (GLYCOLAX/MIRALAX) 17 GM/SCOOP powder, Take 17 g by mouth daily as needed for mild constipation., Disp: 255 g, Rfl: 0   Probiotic Product (ALIGN) 4 MG CAPS, Take 4 mg by mouth daily., Disp: , Rfl:    ranolazine (RANEXA) 500 MG 12 hr tablet, Take 1 tablet (500 mg total) by mouth 2 (two) times daily., Disp: 180 tablet, Rfl: 3   VYTORIN 10-20 MG per tablet, Take 1 tablet by mouth daily at 6 PM. , Disp: , Rfl:   Vitals   Current vital signs: BP (!) 128/53   Pulse (!) 50   Temp 99.3 F (37.4 C) (Oral)   Resp 18   Ht 5\' 3"  (1.6 m)   Wt 84.4 kg   SpO2 100%   BMI 32.95 kg/m  Vital signs in last 24 hours: Temp:  [99.3 F (37.4 C)] 99.3 F (37.4 C) (01/25 1633) Pulse Rate:  [45-56] 50 (01/25 1845) Resp:  [13-20] 18 (01/25 1845) BP: (127-148)/(47-69) 128/53 (01/25 1845) SpO2:  [100 %] 100 % (01/25 1845) Weight:  [84.4 kg] 84.4 kg (01/25 1128)  Body mass index is 32.95 kg/m.  Physical Exam   Constitutional: Appears well-developed and well-nourished.  Psych: Affect appropriate to situation.  Eyes: No scleral injection.  HENT: No OP obstruction.  Head: Normocephalic.  Cardiovascular: Normal rate and regular rhythm.  Respiratory: Effort normal, non-labored breathing.  GI: Soft.  No distension. There is no tenderness.  Skin: Bilateral lower extremities with erythema.  Right lower extremity concern for cellulitis at old surgical site  Neurologic Examination   Neuro:  Mental Status: Patient is awake, alert, oriented to person, place, month, year, and situation. Patient is able to give a clear and coherent history. No signs of aphasia or neglect Cranial Nerves: II: Visual Fields are full. Pupils are equal, round, and reactive to light.   III,IV, VI: EOMI without ptosis or diploplia.  V: Facial sensation is symmetric to temperature VII: Facial movement is  symmetric resting and smiling VIII: Hearing is intact to voice X: Palate elevates symmetrically XI: Shoulder shrug is symmetric. XII: Tongue protrudes midline without atrophy or fasciculations.  Motor: Tone is normal. Bulk is normal.  RUE 5/5       LUE 5/5 RLE 5/5 (with cautious hand placement due to pain) LLE 5/5 Sensory: Sensation is symmetric to light touch and temperature in the arms and legs. No extinction to DSS present.  Cerebellar: Ataxia noted with FNF on LUE    Labs/Imaging/Neurodiagnostic studies   CBC:  Recent Labs  Lab 04-30-23 1313  WBC 7.6  NEUTROABS 4.6  HGB 9.6*  HCT 30.8*  MCV 90.3  PLT 279    Basic Metabolic Panel:  Lab Results  Component Value Date   NA 140 04-30-23   K 3.6 April 30, 2023   CO2 25 April 30, 2023   GLUCOSE 150 (H) 04-30-23   BUN 15 April 30, 2023   CREATININE 0.88 2023/04/30   CALCIUM 8.6 (L) 30-Apr-2023   GFRNONAA >60 2023-04-30   GFRAA >60 04/12/2019    Lipid Panel:  Lab Results  Component Value Date   LDLCALC 74 04/18/2014    HgbA1c:  Lab Results  Component Value Date   HGBA1C 6.0 (H) 04/20/2014    INR  Lab Results  Component Value Date   INR 1.0 05/21/2021    APTT  Lab Results  Component Value Date   APTT 25 11/07/2017    CT angio Head and Neck with contrast - pending  MRI Brain(Personally reviewed): 1. 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 2. Stable mild periventricular T2 hyperintensities bilaterally. This likely reflects the sequela of chronic microvascular ischemia. 3. Polyps or mucous retention cysts within the maxillary sinuses bilaterally.      ASSESSMENT   JOHNESHA ACHEAMPONG is a 83 y.o. female  has a past medical history of Anxiety, Blood transfusion, CAP (community acquired pneumonia), COPD (chronic obstructive pulmonary disease) (HCC), Heart murmur, History of colon polyps, Hyperlipidemia, Hypertension, MI (myocardial infarction) (HCC), Mitral valve prolapse,  NSTEMI (non-ST elevated myocardial infarction) (HCC), Osteoarthritis, Osteoporosis, Pulmonary emboli (HCC) (04/12/2019), Spinal stenosis, Thyroid disease, Unstable angina (HCC) (05/08/2014), and Venous insufficiency.  Presenting with dizziness, room spinning, left-sided weakness.  She is currently on Eliquis 2.5 mg twice a day.  Will need to be admitted for a stroke workup as further workup may inform risk/benefit of increasing Eliquis dose to 5 mg twice daily, particularly echocardiogram findings and telemetry if atrial fibrillation is determined (her CHA2DS2-VASc will have increased by at least 2 due to this event). Reassuringly Hgb is 9.6 (last 7.5 in 06/2022). CTA head and neck is pending and the rest of the workup has been ordered as well as a bilateral lower extremity venous duplex. Stroke team to follow tomorrow  TSH is elevated at 7.164  RECOMMENDATIONS  - TSH elevated, free T4 ordered  - HgbA1c 6.4% (meeting goal) - fasting lipid panel (goal LDL < 70) - CTA Head and Neck pending - Frequent neuro checks - Echocardiogram - Resume anticoagulation Eliquis 2.5 mg twice daily for now as she has been tolerating this dose without hemorrhagic conversion - May need further consultation with GI or hematology on risk/benefit of increasing Eliquis to normal 5 mg twice daily dosing pending additional workup - Risk factor modification - Telemetry monitoring - PT consult, OT consult, Speech consult - Appreciate management of comorbidities per primary team - Stroke team to follow  ______________________________________________________________________   Patient seen and examined by NP/APP with MD. MD to update note as needed.   Elmer Picker, DNP, FNP-BC [facility] Pager: [phone]  Attending Neurologist's note:  I personally saw this patient, gathering history, performing a full neurologic examination, reviewing relevant  labs, personally reviewing relevant imaging including MRI  brain, and formulated the assessment and plan, adding the note above for completeness and clarity to accurately reflect my thoughts   Brooke Dare MD-PhD [facility] [phone] Available 7 AM to 7 PM, outside these hours please contact Neurologist on call listed on AMION

## 2023-04-04 NOTE — ED Provider Notes (Signed)
Pt signed out by Dr. Rush Landmark pending labs/imaging.  Labs with hgb 9.6 (7.5 on 4/24); cmp with nl cr, bs sl elevated at 150.  MRI reviewed by me.  I agree with the radiologist.    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 2. Stable mild periventricular T2 hyperintensities bilaterally. This likely reflects the sequela of chronic microvascular ischemia. 3. Polyps or mucous retention cysts within the maxillary sinuses bilaterally.   Pt is out of the window for TNK/IR  Pt d/w neurology (Dr. Iver Nestle) who did consult on patient.  Pt d/w [facility] for admission.   Jacalyn Lefevre, MD 04/04/23 [phone]

## 2023-04-04 NOTE — ED Provider Triage Note (Signed)
Emergency Medicine Provider Triage Evaluation Note  GWENEVERE GOGA , a 83 y.o. female  was evaluated in triage.  Pt complains of L shoulder and L arm and left leg tremors started 1 week ago (family reports L sided weakness) and has gotten worse, also complaining of L leg swelling and pain around wound post squamous cell carcinoma removal in Dec. Has been using steroid cream for leg w/out relief. Currently taking eliquis. Used to be able to ambulate until vertigo this last week made it more difficult to move.   Endorses intermittent, painful R sided headaches that last 2-3 minutes, palpitations, intermittent vertigo, fatigue, bilateral lower extremity swelling Previous Hx of PAD, CAD, T2DM, HTN, COPD on 3L at baseline, CKD, CHF, PE  Denies fevers, chest pain, worsening shortness of breath, abdominal pain, diarrhea, constipation, dysuria  Review of Systems  Positive: See above Negative: See above  Physical Exam  BP (!) 148/69 (BP Location: Right Arm)   Pulse (!) 56   Temp 99.3 F (37.4 C) (Oral)   Resp 18   Ht 5\' 3"  (1.6 m)   Wt 84.4 kg   SpO2 100%   BMI 32.95 kg/m  Gen:   Awake, no distress   Resp:  Normal effort  MSK:   Moves extremities without difficulty  Other:    Medical Decision Making  Medically screening exam initiated at 11:39 AM.  Appropriate orders placed.  SHARRY BEINING was informed that the remainder of the evaluation will be completed by another provider, this initial triage assessment does not replace that evaluation, and the importance of remaining in the ED until their evaluation is complete.     Lunette Stands, [place] 04/04/23 1154

## 2023-04-05 ENCOUNTER — Observation Stay (HOSPITAL_BASED_OUTPATIENT_CLINIC_OR_DEPARTMENT_OTHER): Payer: Medicare PPO

## 2023-04-05 ENCOUNTER — Other Ambulatory Visit (HOSPITAL_COMMUNITY): Payer: Medicare PPO

## 2023-04-05 ENCOUNTER — Observation Stay (HOSPITAL_COMMUNITY): Payer: Medicare PPO

## 2023-04-05 DIAGNOSIS — I87329 Chronic venous hypertension (idiopathic) with inflammation of unspecified lower extremity: Secondary | ICD-10-CM | POA: Diagnosis not present

## 2023-04-05 DIAGNOSIS — R609 Edema, unspecified: Secondary | ICD-10-CM

## 2023-04-05 DIAGNOSIS — Z1152 Encounter for screening for COVID-19: Secondary | ICD-10-CM | POA: Diagnosis not present

## 2023-04-05 DIAGNOSIS — I63542 Cerebral infarction due to unspecified occlusion or stenosis of left cerebellar artery: Secondary | ICD-10-CM | POA: Diagnosis not present

## 2023-04-05 DIAGNOSIS — I639 Cerebral infarction, unspecified: Secondary | ICD-10-CM | POA: Diagnosis not present

## 2023-04-05 DIAGNOSIS — Z86711 Personal history of pulmonary embolism: Secondary | ICD-10-CM | POA: Diagnosis not present

## 2023-04-05 DIAGNOSIS — I10 Essential (primary) hypertension: Secondary | ICD-10-CM | POA: Diagnosis not present

## 2023-04-05 DIAGNOSIS — Z87891 Personal history of nicotine dependence: Secondary | ICD-10-CM | POA: Diagnosis not present

## 2023-04-05 DIAGNOSIS — Q2733 Arteriovenous malformation of digestive system vessel: Secondary | ICD-10-CM | POA: Diagnosis not present

## 2023-04-05 DIAGNOSIS — K922 Gastrointestinal hemorrhage, unspecified: Secondary | ICD-10-CM | POA: Diagnosis not present

## 2023-04-05 DIAGNOSIS — I2510 Atherosclerotic heart disease of native coronary artery without angina pectoris: Secondary | ICD-10-CM | POA: Diagnosis not present

## 2023-04-05 LAB — BASIC METABOLIC PANEL
Anion gap: 14 (ref 5–15)
BUN: 15 mg/dL (ref 8–23)
CO2: 24 mmol/L (ref 22–32)
Calcium: 9.3 mg/dL (ref 8.9–10.3)
Chloride: 103 mmol/L (ref 98–111)
Creatinine, Ser: 1.01 mg/dL — ABNORMAL HIGH (ref 0.44–1.00)
GFR, Estimated: 55 mL/min — ABNORMAL LOW (ref >60)
Glucose, Bld: 115 mg/dL — ABNORMAL HIGH (ref 70–99)
Potassium: 4.4 mmol/L (ref 3.5–5.1)
Sodium: 141 mmol/L (ref 135–145)

## 2023-04-05 LAB — GLUCOSE, CAPILLARY
Glucose-Capillary: 154 mg/dL — ABNORMAL HIGH (ref 70–99)
Glucose-Capillary: 118 mg/dL — ABNORMAL HIGH (ref 70–99)
Glucose-Capillary: 114 mg/dL — ABNORMAL HIGH (ref 70–99)
Glucose-Capillary: 124 mg/dL — ABNORMAL HIGH (ref 70–99)

## 2023-04-05 LAB — LIPID PANEL
Cholesterol: 141 mg/dL (ref 0–200)
HDL: 43 mg/dL (ref >40)
LDL Cholesterol: 77 mg/dL (ref 0–99)
Total CHOL/HDL Ratio: 3.3 {ratio}
Triglycerides: 107 mg/dL (ref <150)
VLDL: 21 mg/dL (ref 0–40)

## 2023-04-05 LAB — ECHOCARDIOGRAM COMPLETE BUBBLE STUDY
Area-P 1/2: 2.24 cm2
S' Lateral: 3.5 cm

## 2023-04-05 LAB — ECHOCARDIOGRAM COMPLETE
Height: 63 in
Weight: 2976 [oz_av]

## 2023-04-05 LAB — MAGNESIUM: Magnesium: 2 mg/dL (ref 1.7–2.4)

## 2023-04-05 MED ORDER — METOPROLOL TARTRATE 25 MG PO TABS: 25.0000 mg | ORAL_TABLET | Freq: Two times a day (BID) | ORAL | Status: DC

## 2023-04-05 MED ORDER — METOPROLOL TARTRATE 12.5 MG HALF TABLET
12.5000 mg | ORAL_TABLET | Freq: Two times a day (BID) | ORAL | Status: DC
  Filled 2023-04-05: qty 1

## 2023-04-05 NOTE — Progress Notes (Signed)
Patient does not drive. Patient is driven by family members to appointments and stores. Lack of transportation is not an issue. Patient stated family checks on her everyday. Her daughter cooks and brings her dinner every night. Patient stated she is very well taken care of.

## 2023-04-05 NOTE — Progress Notes (Addendum)
STROKE TEAM PROGRESS NOTE   BRIEF HPI Ms. Desiree Leon is a 83 y.o. female with history of COPD, hypertension, hyperlipidemia, MI, mitral valve prolapse, pulmonary embolism on Eliquis, hypothyroidism, and venous insufficiency presenting with dizziness headache, left-sided weakness and some tremors has been going on for 1 week.  She states her balance has been off and has not gotten better so she came to be evaluated.  She did miss 1 day of her Eliquis recently.  MRI brain with acute/subacute left cerebellum infarct  NIH on Admission 1   SIGNIFICANT HOSPITAL EVENTS MRI brain with acute/subacute left cerebellum infarct  INTERIM HISTORY/SUBJECTIVE Patient's family is at the bedside patient is awake alert and oriented in no apparent distress she is on nasal cannula which she wears at home. She states that she still is experiencing the dizziness and weakness.  She also complains of pain in her right calf.  OBJECTIVE  CBC    Component Value Date/Time   WBC 7.6 04/04/2023 1313   RBC 3.41 (L) 04/04/2023 1313   HGB 9.6 (L) 04/04/2023 1313   HGB 12.7 03/18/2021 1132   HCT 30.8 (L) 04/04/2023 1313   HCT 37.9 03/18/2021 1132   PLT 279 04/04/2023 1313   PLT 245 03/18/2021 1132   MCV 90.3 04/04/2023 1313   MCV 97 03/18/2021 1132   MCH 28.2 04/04/2023 1313   MCHC 31.2 04/04/2023 1313   RDW 16.0 (H) 04/04/2023 1313   RDW 13.5 03/18/2021 1132   LYMPHSABS 2.3 04/04/2023 1313   MONOABS 0.5 04/04/2023 1313   EOSABS 0.1 04/04/2023 1313   BASOSABS 0.0 04/04/2023 1313    BMET    Component Value Date/Time   NA 140 04/04/2023 1313   NA 141 03/18/2021 1132   K 3.6 04/04/2023 1313   CL 105 04/04/2023 1313   CO2 25 04/04/2023 1313   GLUCOSE 150 (H) 04/04/2023 1313   BUN 15 04/04/2023 1313   BUN 15 03/18/2021 1132   CREATININE 0.88 04/04/2023 1313   CALCIUM 8.6 (L) 04/04/2023 1313   EGFR 79 03/18/2021 1132   GFRNONAA >60 04/04/2023 1313    IMAGING past 24 hours ECHOCARDIOGRAM COMPLETE  BUBBLE STUDY Result Date: 04/05/2023    ECHOCARDIOGRAM REPORT   Patient Name:   Desiree Leon Date of Exam: 04/05/2023 Medical Rec #:  [id number]      Height:       63.0 in Accession #:    [id number]     Weight:       186.0 lb Date of Birth:  04-12-1940      BSA:          1.875 m Patient Age:    82 years       BP:           131/55 mmHg Patient Gender: F              HR:           54 bpm. Exam Location:  Inpatient Procedure: 2D Echo, Cardiac Doppler and Color Doppler Indications:    stroke  History:        Patient has prior history of Echocardiogram examinations, most                 recent 04/24/2022. COPD and chronic kidney disease; Risk                 Factors:Hypertension, Dyslipidemia and Diabetes.  Sonographer:    Delcie Roch RDCS Referring Phys: [phone] AVA SWAYZE  IMPRESSIONS  1. Left ventricular ejection fraction, by estimation, is 60 to 65%. The left ventricle has normal function. The left ventricle has no regional wall motion abnormalities. There is mild concentric left ventricular hypertrophy. Left ventricular diastolic parameters are indeterminate.  2. Right ventricular systolic function is normal. The right ventricular size is normal. There is mildly elevated pulmonary artery systolic pressure.  3. Left atrial size was mildly dilated.  4. Right to left shunting seen on color doppler - the setting of her CVA futher testing with TEE will be beneficial. Evidence of atrial level shunting detected by color flow Doppler. Agitated saline contrast bubble study was negative, with no evidence of any interatrial shunt.  5. The mitral valve is normal in structure. Mild mitral valve regurgitation. No evidence of mitral stenosis.  6. The aortic valve is normal in structure. Aortic valve regurgitation is not visualized. No aortic stenosis is present.  7. The inferior vena cava is dilated in size with >50% respiratory variability, suggesting right atrial pressure of 8 mmHg. FINDINGS  Left Ventricle: Left ventricular  ejection fraction, by estimation, is 60 to 65%. The left ventricle has normal function. The left ventricle has no regional wall motion abnormalities. The left ventricular internal cavity size was normal in size. There is  mild concentric left ventricular hypertrophy. Left ventricular diastolic parameters are indeterminate. Right Ventricle: The right ventricular size is normal. No increase in right ventricular wall thickness. Right ventricular systolic function is normal. There is mildly elevated pulmonary artery systolic pressure. The tricuspid regurgitant velocity is 3.03  m/s, and with an assumed right atrial pressure of 8 mmHg, the estimated right ventricular systolic pressure is 44.7 mmHg. Left Atrium: Left atrial size was mildly dilated. Right Atrium: Right atrial size was normal in size. Pericardium: There is no evidence of pericardial effusion. Presence of epicardial fat layer. Mitral Valve: The mitral valve is normal in structure. Mild mitral valve regurgitation. No evidence of mitral valve stenosis. Tricuspid Valve: The tricuspid valve is normal in structure. Tricuspid valve regurgitation is not demonstrated. No evidence of tricuspid stenosis. Aortic Valve: The aortic valve is normal in structure. Aortic valve regurgitation is not visualized. No aortic stenosis is present. Pulmonic Valve: The pulmonic valve was not well visualized. Pulmonic valve regurgitation is trivial. No evidence of pulmonic stenosis. Aorta: The aortic root is normal in size and structure. Venous: The inferior vena cava is dilated in size with greater than 50% respiratory variability, suggesting right atrial pressure of 8 mmHg. IAS/Shunts: Evidence of atrial level shunting detected by color flow Doppler. Agitated saline contrast was given intravenously to evaluate for intracardiac shunting. Agitated saline contrast bubble study was negative, with no evidence of any interatrial shunt.  LEFT VENTRICLE PLAX 2D LVIDd:         5.20 cm    Diastology LVIDs:         3.50 cm   LV e' medial:    5.98 cm/s LV PW:         1.10 cm   LV E/e' medial:  18.2 LV IVS:        1.00 cm   LV e' lateral:   7.18 cm/s LVOT diam:     2.10 cm   LV E/e' lateral: 15.2 LV SV:         95 LV SV Index:   51 LVOT Area:     3.46 cm  RIGHT VENTRICLE             IVC RV Basal  diam:  2.80 cm     IVC diam: 2.40 cm RV S prime:     14.30 cm/s TAPSE (M-mode): 2.6 cm LEFT ATRIUM           Index        RIGHT ATRIUM           Index LA diam:      4.50 cm 2.40 cm/m   RA Area:     18.70 cm LA Vol (A2C): 53.6 ml 28.59 ml/m  RA Volume:   51.70 ml  27.57 ml/m LA Vol (A4C): 64.0 ml 34.13 ml/m  AORTIC VALVE LVOT Vmax:   112.00 cm/s LVOT Vmean:  71.700 cm/s LVOT VTI:    0.274 m  AORTA Ao Root diam: 3.30 cm Ao Asc diam:  3.30 cm MITRAL VALVE                TRICUSPID VALVE MV Area (PHT): 2.24 cm     TR Peak grad:   36.7 mmHg MV Decel Time: 338 msec     TR Vmax:        303.00 cm/s MV E velocity: 109.00 cm/s MV A velocity: 89.30 cm/s   SHUNTS MV E/A ratio:  1.22         Systemic VTI:  0.27 m                             Systemic Diam: 2.10 cm Kardie Tobb DO Electronically signed by Thomasene Ripple DO Signature Date/Time: 04/05/2023/2:23:47 PM    Final    VAS [place] LOWER EXTREMITY VENOUS (DVT) (ONLY [facility] & [facility]) Result Date: 04/05/2023  Lower Venous DVT Study Patient Name:  Desiree Leon  Date of Exam:   04/05/2023 Medical Rec #: [id number]       Accession #:    [id number] Date of Birth: 1941-01-19       Patient Gender: F Patient Age:   67 years Exam Location[facility]  [facility] Procedure:      VAS [place] LOWER EXTREMITY VENOUS (DVT) Referring Phys: Lynden Oxford --------------------------------------------------------------------------------  Indications: Edema, Erythema, and Cellulitis, history of venous reflux disease.  Limitations: Edema and poor ultrasound/tissue interface. Comparison Study: Prior bilateral LEV done 05/22/21 Performing Technologist: Sherren Kerns RVS  Examination Guidelines: A  complete evaluation includes B-mode imaging, spectral Doppler, color Doppler, and power Doppler as needed of all accessible portions of each vessel. Bilateral testing is considered an integral part of a complete examination. Limited examinations for reoccurring indications may be performed as noted. The reflux portion of the exam is performed with the patient in reverse Trendelenburg.  +---------+---------------+---------+-----------+----------+-------------------+ RIGHT    CompressibilityPhasicitySpontaneityPropertiesThrombus Aging      +---------+---------------+---------+-----------+----------+-------------------+ CFV      Full           Yes      Yes                                      +---------+---------------+---------+-----------+----------+-------------------+ SFJ      Full                                                             +---------+---------------+---------+-----------+----------+-------------------+ FV  Prox  Full                                                             +---------+---------------+---------+-----------+----------+-------------------+ FV Mid   Full                                                             +---------+---------------+---------+-----------+----------+-------------------+ FV DistalFull                                                             +---------+---------------+---------+-----------+----------+-------------------+ PFV      Full                                                             +---------+---------------+---------+-----------+----------+-------------------+ POP      Full           Yes      Yes                                      +---------+---------------+---------+-----------+----------+-------------------+ PTV      Full                                                             +---------+---------------+---------+-----------+----------+-------------------+ PERO                                                   Not well visualized +---------+---------------+---------+-----------+----------+-------------------+   +----+---------------+---------+-----------+----------+--------------+ LEFTCompressibilityPhasicitySpontaneityPropertiesThrombus Aging +----+---------------+---------+-----------+----------+--------------+ CFV Full           Yes      Yes                                 +----+---------------+---------+-----------+----------+--------------+    Summary: RIGHT: - There is no evidence of deep vein thrombosis in the lower extremity. However, portions of this examination were limited- see technologist comments above.  - No cystic structure found in the popliteal fossa. subcutaneous edema noted throughout calf  LEFT: - No evidence of common femoral vein obstruction.   *See table(s) above for measurements and observations.    Preliminary    CT ANGIO HEAD NECK W WO CM Result Date: 04/04/2023 CLINICAL DATA:  Follow-up examination for stroke. EXAM: CT ANGIOGRAPHY HEAD AND NECK WITH AND WITHOUT  CONTRAST TECHNIQUE: Multidetector CT imaging of the head and neck was performed using the standard protocol during bolus administration of intravenous contrast. Multiplanar CT image reconstructions and MIPs were obtained to evaluate the vascular anatomy. Carotid stenosis measurements (when applicable) are obtained utilizing NASCET criteria, using the distal internal carotid diameter as the denominator. RADIATION DOSE REDUCTION: This exam was performed according to the departmental dose-optimization program which includes automated exposure control, adjustment of the mA and/or kV according to patient size and/or use of iterative reconstruction technique. CONTRAST:  75mL OMNIPAQUE IOHEXOL 350 MG/ML SOLN COMPARISON:  MRI from earlier the same day. FINDINGS: CT HEAD FINDINGS Brain: Cerebral volume within normal limits. No acute intracranial hemorrhage. Evolving left cerebellar infarct  noted, stable from prior MRI. No mass lesion or midline shift. No hydrocephalus or extra-axial fluid collection. Vascular: No abnormal hyperdense vessel. Calcified atherosclerosis present at the skull base. Skull: Scalp soft tissues demonstrate no acute finding. Calvarium intact. Sinuses/Orbits: Globes orbital soft tissues within normal limits. Mild mucosal thickening noted about the maxillary sinuses. No mastoid effusion. Other: None. Review of the MIP images confirms the above findings CTA NECK FINDINGS Aortic arch: Visualized aortic arch within normal limits for caliber with standard branch pattern. Moderate aortic atherosclerosis. No high-grade stenosis about the origin the great vessels. Right carotid system: Right common and internal carotid arteries are tortuous. No dissection. Moderate atheromatous change about the right carotid bulb without hemodynamically significant greater than 50% stenosis. Left carotid system: Left common and internal carotid arteries are tortuous. No dissection. Moderate atheromatous change about the left carotid bulb hemodynamically significant greater than 50% stenosis. Vertebral arteries: Both vertebral arteries arise from subclavian arteries. Vertebral arteries are diffusely tortuous. Right vertebral artery slightly dominant. Atheromatous change at the origin of the right vertebral artery with moderate to severe stenosis. Vertebral arteries otherwise patent distally without stenosis or dissection. Skeleton: No discrete or worrisome osseous lesions. Moderate to advanced spondylosis at C5-6 and C6-7. Other neck: No other acute finding. Upper chest: Or other acute finding. Review of the MIP images confirms the above findings CTA HEAD FINDINGS Anterior circulation: Both ICAs are somewhat ectatic through the siphons. Atherosclerotic change about the carotid siphons without hemodynamically significant stenosis. A1 segments patent bilaterally. Normal anterior communicating complex.  Anterior cerebral arteries patent without stenosis. No M1 stenosis or occlusion. Distal MCA branches perfused and symmetric. Posterior circulation: Both V4 segments patent without stenosis. Right PICA patent. Left PICA origin not well seen. Basilar patent without stenosis. Superior cerebral arteries patent bilaterally. Atheromatous irregularity about the PCAs bilaterally without high-grade stenosis. Prominent right posterior communicating artery noted. Venous sinuses: Patent allowing for timing the contrast bolus. Anatomic variants: None. Review of the MIP images confirms the above findings IMPRESSION: CT HEAD: Evolving left cerebellar infarct, stable from prior MRI. No acute intracranial hemorrhage. CTA HEAD AND NECK: 1. Negative CTA for large vessel occlusion or other emergent finding. 2. Moderate atheromatous change about the carotid bifurcations and carotid siphons without hemodynamically significant stenosis. 3. Moderate to severe stenosis at the origin of the right vertebral artery. 4. Diffuse tortuosity of the major arterial vasculature of the head and neck, suggesting chronic underlying hypertension. 5.  Aortic Atherosclerosis (ICD10-I70.0). Electronically Signed   By: Rise Mu M.D.   On: 04/04/2023 21:37    Vitals:   04/04/23 2348 04/05/23 0345 04/05/23 0724 04/05/23 1102  BP: (!) 126/54 (!) 144/51 (!) 140/56 (!) 131/55  Pulse: (!) 53 (!) 55 (!) 52 (!) 55  Resp: 18 20 18  17  Temp: 98.2 F (36.8 C) 98.6 F (37 C) 98.1 F (36.7 C) 99 F (37.2 C)  TempSrc: Oral Oral Oral Oral  SpO2: 97% 97% 99% 100%  Weight:      Height:         PHYSICAL EXAM General:  Alert, well-nourished, well-developed patient in no acute distress Psych:  Mood and affect appropriate for situation CV: Regular rate and rhythm on monitor Respiratory:  Regular, unlabored respirations on room air GI: Abdomen soft and nontender   NEURO:  Mental Status: AA&Ox3, patient is able to give clear and coherent  history Speech/Language: speech is without dysarthria or aphasia.  Naming, repetition, fluency, and comprehension intact.  Cranial Nerves:  II: PERRL. Visual fields full.  III, IV, VI: EOMI. Eyelids elevate symmetrically.  V: Sensation is intact to light touch and symmetrical to face.  VII: Face is symmetrical resting and smiling VIII: hearing intact to voice. IX, X: Palate elevates symmetrically. Phonation is normal.  WU:[id number] shrug 5/5. XII: tongue is midline without fasciculations. Motor: 5/5 strength to all muscle groups tested.  Tone: is normal and bulk is normal Sensation- Intact to light touch bilaterally. Extinction absent to light touch to DSS.   Coordination: Mild ataxia on right arm Gait- deferred  Most Recent NIH  1a Level of Conscious.: 0 1b LOC Questions: 0 1c LOC Commands: 0 2 Best Gaze: 0 3 Visual: 0 4 Facial Palsy: 0 5a Motor Arm - left: 0 5b Motor Arm - Right: 0 6a Motor Leg - Left: 0 6b Motor Leg - Right: 0 7 Limb Ataxia: 1 8 Sensory: 0 9 Best Language: 0 10 Dysarthria: 0 11 Extinct. and Inatten.: 0 TOTAL: 1   ASSESSMENT/PLAN  Acute Ischemic Infarct:  left cerebellum  Etiology: Likely cardio embolic CT head No acute abnormality.  CTA head & neck no LVO MRI  MRI brain with acute/subacute left cerebellum infarct 2D Echo ordered Ultrasound LE pending Recommend 30-day heart monitor or loop recorder LDL 77 HgbA1c 6.4 VTE prophylaxis -on Eliquis Eliquis prior to admission, now on Eliquis.   Therapy recommendations:  Pending Disposition: Pending  Hypertension CAD s/p stent stent Home meds: Metoprolol 25 mg, nicardipine 20 mg, nitroglycerin patch 0.4 mg, Lasix 20 mg Stable Blood Pressure Goal: BP less than 220/110   Hyperlipidemia Home meds: Vytorin 10/20 mg, resumed in hospital LDL 77, goal < 70 Continue statin at discharge  History of pulmonary embolism COPD Chronic hypoxemia On Eliquis 2.5 mg twice daily, hold  On 2 L nasal cannula  at home  Dysphagia Patient has post-stroke dysphagia, SLP consulted    Diet   Diet regular Fluid consistency: Thin   Advance diet as tolerated  Other Stroke Risk Factors Obesity, Body mass index is 32.95 kg/m., BMI >/= 30 associated with increased stroke risk, recommend weight loss, diet and exercise as appropriate  Coronary artery disease Congestive heart failure   Other Active Problems CKD GERD 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 Agree with note above with the following changes. Patient reports intermittent palpitations over the last week. She will need [employer] cardiac monitoring for one month +/- loop recorder for potential occult  a-fib. 2D echo and carotid U/S pending. Patient is currently on Eliquis 2.5mg  BID for hx of PE; Stroke appears to be at least one week old based on Hx and how well demarcated it appears on [facility] and MRI. Therefore, it is reasonable to resume resume AC; Get STAT CTH if patient develops any new or worsening neuro symptoms. Recommend considering if Eliquis 2.5mg  BID is therapeutic dose for patient given weight and Cr.   Neurology will sign off at this time.   Anibal Henderson, MD [facility]   To contact Stroke Continuity provider, please refer to [email or web address]. After hours, contact General Neurology

## 2023-04-05 NOTE — Progress Notes (Signed)
Patient stating she wears nitro patch all day. Paged primary MD.

## 2023-04-05 NOTE — Evaluation (Signed)
Occupational Therapy Evaluation Patient Details Name: Desiree Leon MRN: [id number] DOB: Jun 24, 1940 Today's Date: 04/05/2023   History of Present Illness Desiree Leon is a 83 yo female presenting with involuntary movements, twitching of head and extremities, sharp intermittent pains in R side of head, dizziness, nausea, and R leg redness and swelling. MRI on 04/04/2023 reveals acute/subacute nonhemorrhagic 18 mm infarct of the left superior cerebellum. PMHx includes anxiety, COPD, hypertension, hyperlipidemia, heart murmer, CAP, MI, Mitral valve prolapse, NSTEMI, osteoarthritis, osteoporosis, pulmonary emboli, spinal stenosis, thyroid disease, and venous insufficiency.   Clinical Impression   Pt evaluated s/p admission list above. Pt lives alone and completes all ADLs/IADLs and functional mobility tasks without use of AD independently at baseline. Pt is limited due generalized weakness and impaired balance. Pt is now able to complete all ADLs and functional mobility tasks up to supervision using RW for increased balance. Pt has a daughter and niece that live close by and can provide 24hr assistance as needed. Reviewed stroke signs and symptoms and the importance of seeking help as soon as possible. Pt verbalized understanding. OT will continue to follow pt acutely without OT follow up to increase pt functional independence and ensure safe discharge to home environment.      If plan is discharge home, recommend the following: A little help with walking and/or transfers    Functional Status Assessment  Patient has had a recent decline in their functional status and demonstrates the ability to make significant improvements in function in a reasonable and predictable amount of time.  Equipment Recommendations  None recommended by OT    Recommendations for Other Services       Precautions / Restrictions Precautions Precautions: Fall Precaution Comments: Unsteadiness with mobility. Chronic  2.5L O2 use Restrictions Weight Bearing Restrictions Per Provider Order: No      Mobility Bed Mobility Overal bed mobility: Modified Independent             General bed mobility comments: HOB elevated    Transfers Overall transfer level: Needs assistance Equipment used: Rolling walker (2 wheels) Transfers: Sit to/from Stand Sit to Stand: Supervision           General transfer comment: Pt transferred to commode using RW with supervision. Pt able to ambulate to bed from sink without use of AD with supervision due to impaired balance      Balance Overall balance assessment: Needs assistance Sitting-balance support: Bilateral upper extremity supported, Feet supported Sitting balance-Leahy Scale: Good     Standing balance support: During functional activity, No upper extremity supported, Single extremity supported Standing balance-Leahy Scale: Fair Standing balance comment: Pt engaged in toilet/clothing management and hand washing standing at sink                           ADL either performed or assessed with clinical judgement   ADL Overall ADL's : Needs assistance/impaired Eating/Feeding: Independent   Grooming: Wash/dry face;Wash/dry hands;Oral care;Applying deodorant;Brushing hair;Independent;Standing;Supervision/safety   Upper Body Bathing: Standing;Supervision/ safety   Lower Body Bathing: Sitting/lateral leans;Supervison/ safety   Upper Body Dressing : Independent;Sitting   Lower Body Dressing: Supervision/safety;Sit to/from stand   Toilet Transfer: Rolling walker (2 wheels);Supervision/safety   Toileting- Architect and Hygiene: Supervision/safety;Sitting/lateral lean;Sit to/from stand       Functional mobility during ADLs: Rolling walker (2 wheels);Supervision/safety General ADL Comments: Pt is close to baseline. Pt would require supervision for safety due to impaired balance.  Vision Baseline Vision/History: 1 Wears  glasses Ability to See in Adequate Light: 0 Adequate Patient Visual Report: Other (comment) (Pt reports occasionally experiencing flashes of light that appears in visual field typically results in a headache or nausea.)       Perception Perception: Within Functional Limits       Praxis Praxis: Not tested       Pertinent Vitals/Pain Pain Assessment Pain Assessment: No/denies pain     Extremity/Trunk Assessment Upper Extremity Assessment Upper Extremity Assessment: Overall WFL for tasks assessed RUE Sensation: WNL RUE Coordination: WNL LUE Sensation: WNL LUE Coordination: WNL   Lower Extremity Assessment Lower Extremity Assessment: Defer to PT evaluation   Cervical / Trunk Assessment Cervical / Trunk Assessment: Normal   Communication Communication Communication: No apparent difficulties   Cognition Arousal: Alert Behavior During Therapy: WFL for tasks assessed/performed Overall Cognitive Status: Within Functional Limits for tasks assessed                                 General Comments: Answered questions appropriately.     General Comments  VSS; 3L of O2; daughter present    Exercises     Shoulder Instructions      Home Living Family/patient expects to be discharged to:: Private residence Living Arrangements: Alone Available Help at Discharge: Family;Available 24 hours/day Type of Home: House Home Access: Ramped entrance     Home Layout: One level     Bathroom Shower/Tub: Producer, television/film/video: Handicapped height Bathroom Accessibility: Yes How Accessible: Accessible via walker Home Equipment: Rolling Walker (2 wheels);Rollator (4 wheels);Shower seat;Grab bars - tub/shower   Additional Comments: Pt lives alone and was independent with IADL/ADLs at baseline. Has a sister that lives next door and a niece that lives behind her that can provide assistance as needed. Does not drive, daughters assists with transporting pt to  appointments      Prior Functioning/Environment Prior Level of Function : Independent/Modified Independent             Mobility Comments: Pt has a rollator, walker, at home but does not typically use an AD at baseline ADLs Comments: Pt lives alone and was independent with IADL/ADLs at baseline. Has a sister that lives next door and a niece that lives behind her that can provide assistance as needed.        OT Problem List: Decreased strength;Decreased activity tolerance;Impaired balance (sitting and/or standing);Decreased safety awareness;Decreased knowledge of use of DME or AE;Impaired sensation      OT Treatment/Interventions:      OT Goals(Current goals can be found in the care plan section) Acute Rehab OT Goals Patient Stated Goal: to go home OT Goal Formulation: With patient Time For Goal Achievement: 04/19/23 Potential to Achieve Goals: Good ADL Goals Additional ADL Goal #1: Pt will complete all ADLs independently  OT Frequency:      Co-evaluation              AM-PAC OT "6 Clicks" Daily Activity     Outcome Measure Help from another person eating meals?: None Help from another person taking care of personal grooming?: A Little Help from another person toileting, which includes using toliet, bedpan, or urinal?: A Little Help from another person bathing (including washing, rinsing, drying)?: A Little Help from another person to put on and taking off regular upper body clothing?: None Help from another person to put on and taking  off regular lower body clothing?: A Little 6 Click Score: 20   End of Session Equipment Utilized During Treatment: Gait belt;Rolling walker (2 wheels);Oxygen Nurse Communication: Mobility status  Activity Tolerance: Patient tolerated treatment well Patient left: in bed;with call bell/phone within reach;with family/visitor present  OT Visit Diagnosis: Unsteadiness on feet (R26.81);Other abnormalities of gait and mobility  (R26.89);Muscle weakness (generalized) (M62.81)                Time: 1610-9604 OT Time Calculation (min): 24 min Charges:  OT General Charges $OT Visit: 1 Visit OT Evaluation $OT Eval Moderate Complexity: 1 Mod OT Treatments $Self Care/Home Management : 8-22 mins  Kevan Ny, MOTS  Ambry Dix 04/05/2023, 11:11 AM

## 2023-04-05 NOTE — Progress Notes (Signed)
VASCULAR LAB    Right lower extremity venous duplex has been performed.  See CV proc for preliminary results.   Trayquan Kolakowski, RVT 04/05/2023, 1:19 PM

## 2023-04-05 NOTE — Progress Notes (Signed)
Pt c/o palpitations on assessment, no pain associated.  Dr Arlean Hopping made aware, orders received, EKG WNL, SB 58 bpm.  Patient stable and resting at this time, will continue to monitor.

## 2023-04-05 NOTE — Evaluation (Signed)
Physical Therapy Evaluation  Patient Details Name: Desiree Leon MRN: [id number] DOB: [birth year] Today's Date: 04/05/2023  History of Present Illness  [name] is a 83 yo female presenting with involuntary movements, twitching of head and extremities, sharp intermittent pains in R side of head, dizziness, nausea, and R leg redness and swelling. MRI on 04/04/2023 reveals acute/subacute nonhemorrhagic 18 mm infarct of the left superior cerebellum. PMHx includes anxiety, COPD, hypertension, hyperlipidemia, heart murmer, CAP, MI, Mitral valve prolapse, NSTEMI, osteoarthritis, osteoporosis, pulmonary emboli, spinal stenosis, thyroid disease, and venous insufficiency.   Clinical Impression  Pt admitted with above diagnosis. Pt currently with functional limitations due to the deficits listed below (see PT Problem List). At the time of PT eval pt was able to perform transfers and ambulation with gross CGA to min assist and RW for support. Pt will have adequate assistance/supervision available from family upon d/c. Recommend post-acute therapy to maximize functional independence, decrease risk for falls, and facilitate return to PLOF. Pt will benefit from acute skilled PT to increase their independence and safety with mobility to allow discharge.           If plan is discharge home, recommend the following: A little help with walking and/or transfers;A little help with bathing/dressing/bathroom;Assist for transportation;Help with stairs or ramp for entrance   Can travel by private vehicle        Equipment Recommendations None recommended by PT  Recommendations for Other Services       Functional Status Assessment Patient has had a recent decline in their functional status and demonstrates the ability to make significant improvements in function in a reasonable and predictable amount of time.     Precautions / Restrictions Precautions Precautions: Fall Precaution Comments: Unsteadiness  with mobility. Chronic 2.5L O2 use Restrictions Weight Bearing Restrictions Per Provider Order: No      Mobility  Bed Mobility Overal bed mobility: Needs Assistance Bed Mobility: Supine to Sit     Supine to sit: Min assist     General bed mobility comments: Pt reaching out for therapist assist to pull to sitting position.    Transfers Overall transfer level: Needs assistance Equipment used: Rolling walker (2 wheels) Transfers: Sit to/from Stand Sit to Stand: Supervision           General transfer comment: Pt again reaching out for therapist assist to pull her into standing. Encouraged pt to push from seated surface for safety and pt appears frustrated. States she had arthritis in her wrists and she has a lift chair at home. However, pt was able to power up to full stand without assistance during session.    Ambulation/Gait Ambulation/Gait assistance: Contact guard assist, Min assist Gait Distance (Feet): 200 Feet Assistive device: Rolling walker (2 wheels) Gait Pattern/deviations: Step-through pattern, Decreased stride length, Trunk flexed Gait velocity: Decreased Gait velocity interpretation: <1.31 ft/sec, indicative of household ambulator   General Gait Details: VC's for improved posture, closer walker proximity and forward gaze. Pt frequently taking 1 hand off walker to talk, point, etc. 1 LOB when hand was off walker. Educated pt on safety and keeping both hands on walker to decrease risk for falls.  Stairs            Wheelchair Mobility     Tilt Bed    Modified Rankin (Stroke Patients Only) Modified Rankin (Stroke Patients Only) Pre-Morbid Rankin Score: Slight disability Modified Rankin: Moderately severe disability     Balance Overall balance assessment: Needs assistance Sitting-balance support: Bilateral  upper extremity supported, Feet supported Sitting balance-Leahy Scale: Good     Standing balance support: During functional activity, No upper  extremity supported, Single extremity supported, Reliant on assistive device for balance Standing balance-Leahy Scale: Poor Standing balance comment: dynamically                             Pertinent Vitals/Pain Pain Assessment Pain Assessment: No/denies pain    Home Living Family/patient expects to be discharged to:: Private residence Living Arrangements: Alone Available Help at Discharge: Family;Available 24 hours/day Type of Home: House Home Access: Ramped entrance       Home Layout: One level Home Equipment: Agricultural consultant (2 wheels);Rollator (4 wheels);Shower seat;Grab bars - tub/shower Additional Comments: Pt lives alone and was independent with IADL/ADLs at baseline. Has a sister that lives next door and a niece that lives behind her that can provide assistance as needed. Does not drive, daughters assists with transporting pt to appointments    Prior Function Prior Level of Function : Independent/Modified Independent             Mobility Comments: Pt has a rollator, walker, at home but does not typically use an AD at baseline ADLs Comments: Pt lives alone and was independent with IADL/ADLs at baseline. Has a daughter that lives next door and a granddaughter that lives behind her that can provide assistance as needed.     Extremity/Trunk Assessment   Upper Extremity Assessment Upper Extremity Assessment: Defer to OT evaluation    Lower Extremity Assessment Lower Extremity Assessment: Generalized weakness    Cervical / Trunk Assessment Cervical / Trunk Assessment: Other exceptions Cervical / Trunk Exceptions: Significant forward head posture with rounded shoulders  Communication   Communication Communication: No apparent difficulties  Cognition Arousal: Alert Behavior During Therapy: WFL for tasks assessed/performed Overall Cognitive Status: Within Functional Limits for tasks assessed                                 General  Comments: Answered questions appropriately.        General Comments General comments (skin integrity, edema, etc.): VSS on 3L/miin supplemental O2    Exercises     Assessment/Plan    PT Assessment Patient needs continued PT services  PT Problem List Decreased strength;Decreased activity tolerance;Decreased balance;Decreased mobility;Decreased knowledge of use of DME;Decreased safety awareness;Decreased knowledge of precautions;Pain       PT Treatment Interventions DME instruction;Gait training;Stair training;Functional mobility training;Therapeutic activities;Therapeutic exercise;Balance training;Patient/family education    PT Goals (Current goals can be found in the Care Plan section)  Acute Rehab PT Goals Patient Stated Goal: Home at d/c PT Goal Formulation: With patient/family Time For Goal Achievement: 04/19/23 Potential to Achieve Goals: Good    Frequency Min 1X/week     Co-evaluation               AM-PAC PT "6 Clicks" Mobility  Outcome Measure Help needed turning from your back to your side while in a flat bed without using bedrails?: A Little Help needed moving from lying on your back to sitting on the side of a flat bed without using bedrails?: A Little Help needed moving to and from a bed to a chair (including a wheelchair)?: A Little Help needed standing up from a chair using your arms (e.g., wheelchair or bedside chair)?: A Little Help needed to walk in hospital room?: A  Little Help needed climbing 3-5 steps with a railing? : A Little 6 Click Score: 18    End of Session Equipment Utilized During Treatment: Gait belt Activity Tolerance: Patient tolerated treatment well Patient left: in chair;with call bell/phone within reach;with family/visitor present;with chair alarm set Nurse Communication: Mobility status PT Visit Diagnosis: Unsteadiness on feet (R26.81);Other symptoms and signs involving the nervous system (R29.898)    Time: 8119-1478 PT Time  Calculation (min) (ACUTE ONLY): 37 min   Charges:   PT Evaluation $PT Eval Moderate Complexity: 1 Mod PT Treatments $Gait Training: 8-22 mins PT General Charges $$ ACUTE PT VISIT: 1 Visit         Conni Slipper, PT, DPT Acute Rehabilitation Services Secure Chat Preferred Office: [phone]   Marylynn Pearson 04/05/2023, 1:24 PM

## 2023-04-05 NOTE — Plan of Care (Signed)
  Problem: Health Behavior/Discharge Planning: Goal: Ability to manage health-related needs will improve Outcome: Progressing Goal: Goals will be collaboratively established with patient/family Outcome: Progressing   Problem: Education: Goal: Knowledge of disease or condition will improve Outcome: Progressing Goal: Knowledge of secondary prevention will improve (MUST DOCUMENT ALL) Outcome: Progressing Goal: Knowledge of patient specific risk factors will improve (DELETE if not current risk factor) Outcome: Progressing

## 2023-04-05 NOTE — Progress Notes (Signed)
PROGRESS NOTE  Desiree Leon ZOX:[id number] DOB: May 24, 1940 DOA: 04/04/2023 PCP: Geoffry Paradise, MD  Brief History   The patient is a 83 yr old woman who presented to [facility] ED with complaints of left sided weakness and tremors that has become progressively worse. She has also had dizziness and nausea and a right sided headache. Sh also complains of bilateral lower extremitiies right greater than left along with progressive worsening of edema in the legs right greater than left. MRI brain demonstrated an acute/subacute 18 mm infarct of the let superior cerebellum. The patient has been admitted to the hospitalists and neurology has been consulted.   She carries a past medical history of mitral valve prolapse, hypothyroidism, diabetes mellitus 2, dyslipidemia, GERD, unstable angina and CAD with a stent to the LAD and D2 in 2016, left leg sciatica, COPD, hypertension, CKD 2, and large bilateral PE with RV strain in 2023. She has also had a history of lower GI bleeding due to a large colonic AVM treated with APC and uristat.    A & P  Assessment and Plan: Acute/subacute nonhemorrhagic infarct of the left superior cerebellum Appreciate assistance of the neurology team CTA head and neck demonstrates no LVO. MRI brain confirms acute/subacute left cerebellum infarct Echocardiogram is pending. 30 day heart monitor at discharge recommended by neurology. Continue Vytorin as outpatient. Hemoglobin A1c and lipid panel PT/OT/SLP evaluations On low-dose Eliquis prior to admission.  History of recent GI bleed February 2024 due to large colonic AVM.  Will hold off on low-dose aspirin for now Permissive hypertension   Asthma with chronic hypoxemia Continue home nasal cannula O2   CAD with prior stent to LAD and OM 2 Continue Vytorin 10/20 Has history of unstable angina but with need for permissive hypertension will hold Lopressor and Ranexa as well as nicardipine   History of PE in 2023 with RV  strain Continue Eliquis   Lower GI bleeding 2024 secondary to large colonic AVM Status post APC and Uristat Has been stable on low-dose Eliquis Not initiate low-dose aspirin at this juncture   LE edema/stasis dermatitis Lasix 20 mg daily with Kdur Left lower extremity larger than right. Doppler of lower extremities has been ordered.   Hypothyroidism Current TSH is 7.164 Continue Synthroid-patient states she takes this medication on an empty stomach Follow-up TSH at PCP office at their discretion   Diabetes mellitus 2 Diet controlled Follow-up on hemoglobin A1c Follow CBGs and provide SSI   HLD Continue Vytorin LDL is 77. Goal is less than 70.   GERD Continue PPI     Advance Care Planning:   Code Status: Full Code    VTE prophylaxis: Eliquis   Consults: Neurology   Family Communication: Daughter at bedside  I have seen and examined this patient myself. I have spent 32 minutes in her care and evaluation.  DVT prophylaxis: Eliquis Code Status: Full Code Family Communication: Daughter at bedside Disposition Plan: tbd    Jimmy Plessinger, DO [facility] Direct contact: see [email or web address]  7PM-7AM contact night coverage as above 04/05/2023, 9:44 PM  LOS: 0 days   Consultants  Neurology  Procedures  None  Antibiotics  None  Interval History/Subjective  The patient is sitting up in bed. She states that she still feels very dizzy when she is on her feet. She has worked with PT today, and was able to walk with SBA and a walker. Does not feel steady on her feet.  Objective   Vitals:  Vitals:  04/05/23 1655 04/05/23 1927  BP: (!) 155/44 (!) 141/46  Pulse: (!) 56 (!) 54  Resp: 18 18  Temp: 98.7 F (37.1 C) 98.6 F (37 C)  SpO2: 100% 100%    Exam:  Constitutional:  The patient is awake, alert, and oriented x 3. She is complaining of dizziness when not in bed. Respiratory:  No increased work of breathing. No wheezes, rales, or rhonchi No tactile  fremitus Cardiovascular:  Regular rate and rhythm No murmurs, ectopy, or gallups. No lateral PMI. No thrills. Abdomen:  Abdomen is soft, non-tender, non-distended No hernias, masses, or organomegaly Normoactive bowel sounds.  Musculoskeletal:  No cyanosis, clubbing, or edema Skin:  No rashes, lesions, ulcers palpation of skin: no induration or nodules Neurologic:  CN 2-12 intact Sensation all 4 extremities intact Psychiatric:  Mental status Mood, affect appropriate Orientation to person, place, time  judgment and insight appear intact    Scheduled Meds:  apixaban  2.5 mg Oral BID   escitalopram  5 mg Oral QPM   simvastatin  20 mg Oral q1800   And   ezetimibe  10 mg Oral Daily   furosemide  20 mg Oral Daily   insulin aspart  0-5 Units Subcutaneous QHS   insulin aspart  0-9 Units Subcutaneous TID WC   levothyroxine  125 mcg Oral Q0600   pantoprazole  40 mg Oral BID   Continuous Infusions:  Principal Problem:   Acute CVA (cerebrovascular accident) (HCC)   LOS: 0 days

## 2023-04-05 NOTE — Care Management Obs Status (Signed)
MEDICARE OBSERVATION STATUS NOTIFICATION   Patient Details  Name: Desiree Leon MRN: [id number] Date of Birth: Jul 10, 1940   Medicare Observation Status Notification Given:  Yes    Lawerance Sabal, RN 04/05/2023, 1:13 PM

## 2023-04-05 NOTE — Progress Notes (Signed)
  Echocardiogram 2D Echocardiogram has been performed.  Desiree Leon 04/05/2023, 2:05 PM

## 2023-04-05 NOTE — Progress Notes (Signed)
Per stroke NP, nitro patch not ordered at this time d/t MD wanting BP to be a little high. Will notify pt.

## 2023-04-06 ENCOUNTER — Other Ambulatory Visit (HOSPITAL_COMMUNITY): Payer: Self-pay

## 2023-04-06 ENCOUNTER — Encounter: Payer: Self-pay | Admitting: Cardiology

## 2023-04-06 DIAGNOSIS — R42 Dizziness and giddiness: Secondary | ICD-10-CM

## 2023-04-06 DIAGNOSIS — I25118 Atherosclerotic heart disease of native coronary artery with other forms of angina pectoris: Secondary | ICD-10-CM | POA: Diagnosis not present

## 2023-04-06 DIAGNOSIS — I639 Cerebral infarction, unspecified: Secondary | ICD-10-CM | POA: Diagnosis not present

## 2023-04-06 LAB — CBC WITH DIFFERENTIAL/PLATELET
Abs Immature Granulocytes: 0.02 10*3/uL (ref 0.00–0.07)
Basophils Absolute: 0 10*3/uL (ref 0.0–0.1)
Basophils Relative: 0 %
Eosinophils Absolute: 0.1 10*3/uL (ref 0.0–0.5)
Eosinophils Relative: 1 %
HCT: 31.3 % — ABNORMAL LOW (ref 36.0–46.0)
Hemoglobin: 9.7 g/dL — ABNORMAL LOW (ref 12.0–15.0)
Immature Granulocytes: 0 %
Lymphocytes Relative: 40 %
Lymphs Abs: 3.1 10*3/uL (ref 0.7–4.0)
MCH: 27.4 pg (ref 26.0–34.0)
MCHC: 31 g/dL (ref 30.0–36.0)
MCV: 88.4 fL (ref 80.0–100.0)
Monocytes Absolute: 0.5 10*3/uL (ref 0.1–1.0)
Monocytes Relative: 6 %
Neutro Abs: 4 10*3/uL (ref 1.7–7.7)
Neutrophils Relative %: 53 %
Platelets: 280 10*3/uL (ref 150–400)
RBC: 3.54 MIL/uL — ABNORMAL LOW (ref 3.87–5.11)
RDW: 16.1 % — ABNORMAL HIGH (ref 11.5–15.5)
WBC: 7.6 10*3/uL (ref 4.0–10.5)
nRBC: 0 % (ref 0.0–0.2)

## 2023-04-06 LAB — BASIC METABOLIC PANEL
Anion gap: 9 (ref 5–15)
BUN: 13 mg/dL (ref 8–23)
CO2: 27 mmol/L (ref 22–32)
Calcium: 9 mg/dL (ref 8.9–10.3)
Chloride: 103 mmol/L (ref 98–111)
Creatinine, Ser: 0.88 mg/dL (ref 0.44–1.00)
GFR, Estimated: >60 mL/min (ref >60)
Glucose, Bld: 134 mg/dL — ABNORMAL HIGH (ref 70–99)
Potassium: 4.5 mmol/L (ref 3.5–5.1)
Sodium: 139 mmol/L (ref 135–145)

## 2023-04-06 LAB — GLUCOSE, CAPILLARY
Glucose-Capillary: 139 mg/dL — ABNORMAL HIGH (ref 70–99)
Glucose-Capillary: 142 mg/dL — ABNORMAL HIGH (ref 70–99)
Glucose-Capillary: 127 mg/dL — ABNORMAL HIGH (ref 70–99)

## 2023-04-06 MED ORDER — RANOLAZINE ER 500 MG PO TB12
500.0000 mg | ORAL_TABLET | Freq: Two times a day (BID) | ORAL | Status: DC
Start: 2023-04-06 — End: 2023-04-06
  Administered 2023-04-06: 500 mg via ORAL
  Filled 2023-04-06 (×2): qty 1

## 2023-04-06 MED ORDER — METOPROLOL TARTRATE 25 MG PO TABS
12.5000 mg | ORAL_TABLET | Freq: Two times a day (BID) | ORAL | 0 refills | Status: DC
  Filled 2023-04-06: qty 30, 30d supply, fill #0

## 2023-04-06 MED ORDER — RANOLAZINE ER 500 MG PO TB12
500.0000 mg | ORAL_TABLET | Freq: Two times a day (BID) | ORAL | 0 refills | Status: DC
  Filled 2023-04-06: qty 60, 30d supply, fill #0

## 2023-04-06 NOTE — Discharge Summary (Signed)
Physician Discharge Summary   Patient: Desiree Leon MRN: [id number] DOB: 1940/06/28  Admit date:     04/04/2023  Discharge date: 04/06/23  Discharge Physician: Rickey Barbara   PCP: Geoffry Paradise, MD   Recommendations at discharge:    Follow up with PCP in 1-2 weeks F/u with 30 day event monitor, mailed to pt by Cardiology Follow up with Vascular Surgery as scheduled Follow up with Neurology as scheduled Recommend recheck TSH in 4-6 weeks  Discharge Diagnoses: Principal Problem:   Acute CVA (cerebrovascular accident) [facility])  Resolved Problems:   * No resolved hospital problems. *  Hospital Course: 83 yr old woman who presented to [facility] ED with complaints of left sided weakness and tremors that has become progressively worse. She has also had dizziness and nausea and a right sided headache. Sh also complains of bilateral lower extremitiies right greater than left along with progressive worsening of edema in the legs right greater than left. MRI brain demonstrated an acute/subacute 18 mm infarct of the let superior cerebellum. The patient has been admitted to the hospitalists and neurology has been consulted.    She carries a past medical history of mitral valve prolapse, hypothyroidism, diabetes mellitus 2, dyslipidemia, GERD, unstable angina and CAD with a stent to the LAD and D2 in 2016, left leg sciatica, COPD, hypertension, CKD 2, and large bilateral PE with RV strain in 2023. She has also had a history of lower GI bleeding due to a large colonic AVM treated with APC and uristat.    Assessment and Plan: Acute/subacute nonhemorrhagic infarct of the left superior cerebellum Appreciate assistance of the neurology team CTA head and neck demonstrates no LVO. MRI brain confirms acute/subacute left cerebellum infarct Echocardiogram from 2/24 with EF 60-65% 30 day heart monitor at discharge recommended by neurology. Continue Vytorin as outpatient. Hemoglobin A1c 6.4 LDL 77 PT/OT/SLP  evaluations On low-dose Eliquis prior to admission given history of recent GI bleed February 2024 due to large colonic AVM.  -Per Neurology, if paroxysmal afib is seen on 30 day monitor, may consider increasing to full dose eliquis   Asthma with chronic hypoxemia Continue home nasal cannula O2   CAD with prior stent to LAD and OM 2 Continue Vytorin 10/20 Has history of unstable angina -resume home meds on d/c, however had reduced metoprolol to 12.5mg  bid given BP and HR  History of PE in 2023 with RV strain Continue Eliquis   Lower GI bleeding 2024 secondary to large colonic AVM Status post APC and Uristat Has been stable on low-dose Eliquis   LE edema/stasis dermatitis with venous stasis LE dopplers reviewed, neg for DVT -Pt describes BLE edema and erythema that is worse after prolonged standing, resolved with LE elevation. Likely venous stasis -Discussed with Vascular Surgery who will arrange outpt f/u   Hypothyroidism Current TSH is 7.164 Continue Synthroid-patient states she takes this medication on an empty stomach Free T4 normal Recommend repeat TSH in 4-6 weeks   Diabetes mellitus 2 Diet controlled Continued SSI while in hospital   HLD Continue Vytorin LDL is 77.    GERD Continue PPI        Consultants: Neurology Procedures performed:   Disposition: Home Diet recommendation:  Cardiac diet DISCHARGE MEDICATION: Allergies as of 04/06/2023       Reactions   Other Anaphylaxis, Cough   fragarances-perfumes   Dilaudid [hydromorphone Hcl] Nausea Only   Erythromycin Other (See Comments)   Diflucan [fluconazole] Palpitations, Hypertension   When taken with Nicardipine,  reaction happens.        Medication List     TAKE these medications    acetaminophen 500 MG tablet Commonly known as: TYLENOL Take 500 mg by mouth daily as needed for fever or headache.   Align 4 MG Caps Take 4 mg by mouth daily.   ALPRAZolam 0.25 MG tablet Commonly known as:  XANAX Take 0.25 mg by mouth daily as needed for anxiety.   apixaban 2.5 MG Tabs tablet Commonly known as: ELIQUIS Take 1 tablet (2.5 mg total) by mouth 2 (two) times daily.   budesonide-formoterol 160-4.5 MCG/ACT inhaler Commonly known as: Symbicort Inhale 2 puffs into the lungs 2 (two) times daily.   cholecalciferol 25 MCG (1000 UNIT) tablet Commonly known as: VITAMIN D3 Take 2,000 Units by mouth daily.   COLLAGEN PO Take 10 g by mouth daily.   escitalopram 5 MG tablet Commonly known as: LEXAPRO Take 5 mg by mouth every evening.   furosemide 20 MG tablet Commonly known as: LASIX Take 20 mg by mouth daily.   glucose blood test strip Accu-Chek SmartView Test Strips  CHECK BLOOD SUGAR TWICE DAILY.   Levothyroxine Sodium 125 MCG Caps Take 125 mcg by mouth daily before breakfast.   methocarbamol 500 MG tablet Commonly known as: ROBAXIN Take 500 mg by mouth daily as needed for muscle spasms.   metoprolol tartrate 25 MG tablet Commonly known as: LOPRESSOR Take 0.5 tablets (12.5 mg total) by mouth 2 (two) times daily. What changed: how much to take   multivitamin-iron-minerals-folic acid chewable tablet Chew 1 tablet by mouth daily.   niCARdipine 20 MG capsule Commonly known as: CARDENE Take 1 capsule (20 mg total) by mouth every evening.   nitroGLYCERIN 0.4 mg/hr patch Commonly known as: NITRODUR - Dosed in mg/24 hr Place 0.4 mg onto the skin daily.   nitroGLYCERIN 0.4 MG SL tablet Commonly known as: NITROSTAT Place 1 tablet (0.4 mg total) under the tongue every 5 (five) minutes as needed for chest pain.   oxyCODONE 5 MG immediate release tablet Commonly known as: Oxy IR/ROXICODONE Take 5 mg by mouth daily as needed for severe pain.   OXYGEN Inhale 2.5 L into the lungs at bedtime.   pantoprazole 40 MG tablet Commonly known as: PROTONIX Take 1 tablet (40 mg total) by mouth daily at 12 noon. What changed: when to take this   Pataday 0.1 % ophthalmic  solution Generic drug: olopatadine Place 1 drop into both eyes 2 (two) times daily.   ranolazine 500 MG 12 hr tablet Commonly known as: RANEXA Take 1 tablet (500 mg total) by mouth 2 (two) times daily.   triamcinolone cream 0.1 % Commonly known as: KENALOG Apply 1 Application topically 2 (two) times daily. On both legs.   Vytorin 10-20 MG tablet Generic drug: ezetimibe-simvastatin Take 1 tablet by mouth daily at 6 PM.        Follow-up Information     [facility] Follow up.   Specialty: Home Health Services Why: The home health agency will contact you for the first home visit. Contact information: [address] [place] [address] [phone]         Geoffry Paradise, MD Follow up in 1 week(s).   Specialty: Internal Medicine Why: Hospital follow up Contact information: [address] [place] [address] [phone]         Victorino Sparrow, MD Follow up.   Specialty: Vascular Surgery Why: Hospital follow up, as will be scheduled  Contact information: [address] [place] [address] [phone]         Micki Riley, MD Follow up in 8 week(s).   Specialties: Neurology, Radiology Why: Hospital follow up Contact information: [address] [place] [address] [phone]                Discharge Exam: Ceasar Mons Weights   04/04/23 1128  Weight: 84.4 kg   General exam: Awake, laying in bed, in nad Respiratory system: Normal respiratory effort, no wheezing Cardiovascular system: regular rate, s1, s2 Gastrointestinal system: Soft, nondistended, positive BS Central nervous system: CN2-12 grossly intact, strength intact Extremities: Perfused, no clubbing Skin: Normal skin turgor, no notable skin lesions seen Psychiatry: Mood normal // no visual hallucinations   Condition at discharge: fair  The results of significant diagnostics from this hospitalization (including imaging, microbiology, ancillary  and laboratory) are listed below for reference.   Imaging Studies: ECHOCARDIOGRAM COMPLETE BUBBLE STUDY Result Date: 04/05/2023    ECHOCARDIOGRAM REPORT   Patient Name:   Desiree Leon Date of Exam: 04/05/2023 Medical Rec #:  [id number]      Height:       63.0 in Accession #:    [id number]     Weight:       186.0 lb Date of Birth:  November 13, 1940      BSA:          1.875 m Patient Age:    82 years       BP:           131/55 mmHg Patient Gender: F              HR:           54 bpm. Exam Location:  Inpatient Procedure: 2D Echo, Cardiac Doppler and Color Doppler Indications:    stroke  History:        Patient has prior history of Echocardiogram examinations, most                 recent 04/24/2022. COPD and chronic kidney disease; Risk                 Factors:Hypertension, Dyslipidemia and Diabetes.  Sonographer:    Delcie Roch RDCS Referring Phys: [id number] AVA SWAYZE IMPRESSIONS  1. Left ventricular ejection fraction, by estimation, is 60 to 65%. The left ventricle has normal function. The left ventricle has no regional wall motion abnormalities. There is mild concentric left ventricular hypertrophy. Left ventricular diastolic parameters are indeterminate.  2. Right ventricular systolic function is normal. The right ventricular size is normal. There is mildly elevated pulmonary artery systolic pressure.  3. Left atrial size was mildly dilated.  4. Right to left shunting seen on color doppler - the setting of her CVA futher testing with TEE will be beneficial. Evidence of atrial level shunting detected by color flow Doppler. Agitated saline contrast bubble study was negative, with no evidence of any interatrial shunt.  5. The mitral valve is normal in structure. Mild mitral valve regurgitation. No evidence of mitral stenosis.  6. The aortic valve is normal in structure. Aortic valve regurgitation is not visualized. No aortic stenosis is present.  7. The inferior vena cava is dilated in size with >50% respiratory  variability, suggesting right atrial pressure of 8 mmHg. FINDINGS  Left Ventricle: Left ventricular ejection fraction, by estimation, is 60 to 65%. The left ventricle has normal function. The left ventricle has no regional wall motion abnormalities. The  left ventricular internal cavity size was normal in size. There is  mild concentric left ventricular hypertrophy. Left ventricular diastolic parameters are indeterminate. Right Ventricle: The right ventricular size is normal. No increase in right ventricular wall thickness. Right ventricular systolic function is normal. There is mildly elevated pulmonary artery systolic pressure. The tricuspid regurgitant velocity is 3.03  m/s, and with an assumed right atrial pressure of 8 mmHg, the estimated right ventricular systolic pressure is 44.7 mmHg. Left Atrium: Left atrial size was mildly dilated. Right Atrium: Right atrial size was normal in size. Pericardium: There is no evidence of pericardial effusion. Presence of epicardial fat layer. Mitral Valve: The mitral valve is normal in structure. Mild mitral valve regurgitation. No evidence of mitral valve stenosis. Tricuspid Valve: The tricuspid valve is normal in structure. Tricuspid valve regurgitation is not demonstrated. No evidence of tricuspid stenosis. Aortic Valve: The aortic valve is normal in structure. Aortic valve regurgitation is not visualized. No aortic stenosis is present. Pulmonic Valve: The pulmonic valve was not well visualized. Pulmonic valve regurgitation is trivial. No evidence of pulmonic stenosis. Aorta: The aortic root is normal in size and structure. Venous: The inferior vena cava is dilated in size with greater than 50% respiratory variability, suggesting right atrial pressure of 8 mmHg. IAS/Shunts: Evidence of atrial level shunting detected by color flow Doppler. Agitated saline contrast was given intravenously to evaluate for intracardiac shunting. Agitated saline contrast bubble study was  negative, with no evidence of any interatrial shunt.  LEFT VENTRICLE PLAX 2D LVIDd:         5.20 cm   Diastology LVIDs:         3.50 cm   LV e' medial:    5.98 cm/s LV PW:         1.10 cm   LV E/e' medial:  18.2 LV IVS:        1.00 cm   LV e' lateral:   7.18 cm/s LVOT diam:     2.10 cm   LV E/e' lateral: 15.2 LV SV:         95 LV SV Index:   51 LVOT Area:     3.46 cm  RIGHT VENTRICLE             IVC RV Basal diam:  2.80 cm     IVC diam: 2.40 cm RV S prime:     14.30 cm/s TAPSE (M-mode): 2.6 cm LEFT ATRIUM           Index        RIGHT ATRIUM           Index LA diam:      4.50 cm 2.40 cm/m   RA Area:     18.70 cm LA Vol (A2C): 53.6 ml 28.59 ml/m  RA Volume:   51.70 ml  27.57 ml/m LA Vol (A4C): 64.0 ml 34.13 ml/m  AORTIC VALVE LVOT Vmax:   112.00 cm/s LVOT Vmean:  71.700 cm/s LVOT VTI:    0.274 m  AORTA Ao Root diam: 3.30 cm Ao Asc diam:  3.30 cm MITRAL VALVE                TRICUSPID VALVE MV Area (PHT): 2.24 cm     TR Peak grad:   36.7 mmHg MV Decel Time: 338 msec     TR Vmax:        303.00 cm/s MV E velocity: 109.00 cm/s MV A velocity: 89.30 cm/s   SHUNTS MV E/A ratio:  1.22         Systemic VTI:  0.27 m                             Systemic Diam: 2.10 cm Kardie Tobb DO Electronically signed by Thomasene Ripple DO Signature Date/Time: 04/05/2023/2:23:47 PM    Final    VAS [place] LOWER EXTREMITY VENOUS (DVT) (ONLY [facility] & [facility]) Result Date: 04/05/2023  Lower Venous DVT Study Patient Name:  CYNDEE GIAMMARCO  Date of Exam:   04/05/2023 Medical Rec #: [id number]       Accession #:    [id number] Date of Birth: [birth year]       Patient Gender: F Patient Age:   82 years Exam Location:  [facility] Procedure:      VAS [place] LOWER EXTREMITY VENOUS (DVT) Referring Phys: Lynden Oxford --------------------------------------------------------------------------------  Indications: Edema, Erythema, and Cellulitis, history of venous reflux disease.  Limitations: Edema and poor ultrasound/tissue interface. Comparison Study: Prior  bilateral LEV done 05/22/21 Performing Technologist: Sherren Kerns RVS  Examination Guidelines: A complete evaluation includes B-mode imaging, spectral Doppler, color Doppler, and power Doppler as needed of all accessible portions of each vessel. Bilateral testing is considered an integral part of a complete examination. Limited examinations for reoccurring indications may be performed as noted. The reflux portion of the exam is performed with the patient in reverse Trendelenburg.  +---------+---------------+---------+-----------+----------+-------------------+ RIGHT    CompressibilityPhasicitySpontaneityPropertiesThrombus Aging      +---------+---------------+---------+-----------+----------+-------------------+ CFV      Full           Yes      Yes                                      +---------+---------------+---------+-----------+----------+-------------------+ SFJ      Full                                                             +---------+---------------+---------+-----------+----------+-------------------+ FV Prox  Full                                                             +---------+---------------+---------+-----------+----------+-------------------+ FV Mid   Full                                                             +---------+---------------+---------+-----------+----------+-------------------+ FV DistalFull                                                             +---------+---------------+---------+-----------+----------+-------------------+ PFV      Full                                                             +---------+---------------+---------+-----------+----------+-------------------+  POP      Full           Yes      Yes                                      +---------+---------------+---------+-----------+----------+-------------------+ PTV      Full                                                              +---------+---------------+---------+-----------+----------+-------------------+ PERO                                                  Not well visualized +---------+---------------+---------+-----------+----------+-------------------+   +----+---------------+---------+-----------+----------+--------------+ LEFTCompressibilityPhasicitySpontaneityPropertiesThrombus Aging +----+---------------+---------+-----------+----------+--------------+ CFV Full           Yes      Yes                                 +----+---------------+---------+-----------+----------+--------------+    Summary: RIGHT: - There is no evidence of deep vein thrombosis in the lower extremity. However, portions of this examination were limited- see technologist comments above.  - No cystic structure found in the popliteal fossa. subcutaneous edema noted throughout calf  LEFT: - No evidence of common femoral vein obstruction.   *See table(s) above for measurements and observations.    Preliminary    CT ANGIO HEAD NECK W WO CM Result Date: 04/04/2023 CLINICAL DATA:  Follow-up examination for stroke. EXAM: CT ANGIOGRAPHY HEAD AND NECK WITH AND WITHOUT CONTRAST TECHNIQUE: Multidetector CT imaging of the head and neck was performed using the standard protocol during bolus administration of intravenous contrast. Multiplanar CT image reconstructions and MIPs were obtained to evaluate the vascular anatomy. Carotid stenosis measurements (when applicable) are obtained utilizing NASCET criteria, using the distal internal carotid diameter as the denominator. RADIATION DOSE REDUCTION: This exam was performed according to the departmental dose-optimization program which includes automated exposure control, adjustment of the mA and/or kV according to patient size and/or use of iterative reconstruction technique. CONTRAST:  75mL OMNIPAQUE IOHEXOL 350 MG/ML SOLN COMPARISON:  MRI from earlier the same day. FINDINGS: CT HEAD FINDINGS Brain:  Cerebral volume within normal limits. No acute intracranial hemorrhage. Evolving left cerebellar infarct noted, stable from prior MRI. No mass lesion or midline shift. No hydrocephalus or extra-axial fluid collection. Vascular: No abnormal hyperdense vessel. Calcified atherosclerosis present at the skull base. Skull: Scalp soft tissues demonstrate no acute finding. Calvarium intact. Sinuses/Orbits: Globes orbital soft tissues within normal limits. Mild mucosal thickening noted about the maxillary sinuses. No mastoid effusion. Other: None. Review of the MIP images confirms the above findings CTA NECK FINDINGS Aortic arch: Visualized aortic arch within normal limits for caliber with standard branch pattern. Moderate aortic atherosclerosis. No high-grade stenosis about the origin the great vessels. Right carotid system: Right common and internal carotid arteries are tortuous. No dissection. Moderate atheromatous change about the right carotid bulb without hemodynamically significant greater than 50% stenosis. Left carotid system: Left common and internal carotid arteries are tortuous. No  dissection. Moderate atheromatous change about the left carotid bulb hemodynamically significant greater than 50% stenosis. Vertebral arteries: Both vertebral arteries arise from subclavian arteries. Vertebral arteries are diffusely tortuous. Right vertebral artery slightly dominant. Atheromatous change at the origin of the right vertebral artery with moderate to severe stenosis. Vertebral arteries otherwise patent distally without stenosis or dissection. Skeleton: No discrete or worrisome osseous lesions. Moderate to advanced spondylosis at C5-6 and C6-7. Other neck: No other acute finding. Upper chest: Or other acute finding. Review of the MIP images confirms the above findings CTA HEAD FINDINGS Anterior circulation: Both ICAs are somewhat ectatic through the siphons. Atherosclerotic change about the carotid siphons without  hemodynamically significant stenosis. A1 segments patent bilaterally. Normal anterior communicating complex. Anterior cerebral arteries patent without stenosis. No M1 stenosis or occlusion. Distal MCA branches perfused and symmetric. Posterior circulation: Both V4 segments patent without stenosis. Right PICA patent. Left PICA origin not well seen. Basilar patent without stenosis. Superior cerebral arteries patent bilaterally. Atheromatous irregularity about the PCAs bilaterally without high-grade stenosis. Prominent right posterior communicating artery noted. Venous sinuses: Patent allowing for timing the contrast bolus. Anatomic variants: None. Review of the MIP images confirms the above findings IMPRESSION: CT HEAD: Evolving left cerebellar infarct, stable from prior MRI. No acute intracranial hemorrhage. CTA HEAD AND NECK: 1. Negative CTA for large vessel occlusion or other emergent finding. 2. Moderate atheromatous change about the carotid bifurcations and carotid siphons without hemodynamically significant stenosis. 3. Moderate to severe stenosis at the origin of the right vertebral artery. 4. Diffuse tortuosity of the major arterial vasculature of the head and neck, suggesting chronic underlying hypertension. 5.  Aortic Atherosclerosis (ICD10-I70.0). Electronically Signed   By: Rise Mu M.D.   On: 04/04/2023 21:37   MR BRAIN WO CONTRAST Result Date: 04/04/2023 CLINICAL DATA:  Neuro deficit, acute, stroke suspected. Transient left upper and lower extremity weakness. New right-sided headache. EXAM: MRI HEAD WITHOUT CONTRAST TECHNIQUE: Multiplanar, multiecho pulse sequences of the brain and surrounding structures were obtained without intravenous contrast. COMPARISON:  CT head without contrast 11/07/2017. FINDINGS: Brain: Diffusion-weighted images demonstrate an acute/subacute nonhemorrhagic infarct of the left superior cerebellum. No other acute infarct is present. T2 and FLAIR hyperintensity is  associated with the area of acute/subacute infarction. No associated hemorrhage is present. Mild periventricular T2 hyperintensities are stable potentially within normal limits for age. The brainstem and cerebellum are otherwise within normal limits. Deep brain nuclei are within normal limits. The ventricles are of normal size. No significant extraaxial fluid collection is present. The internal auditory canals are within normal limits. Vascular: Flow is present in the major intracranial arteries. Skull and upper cervical spine: The craniocervical junction is normal. Upper cervical spine is within normal limits. Marrow signal is unremarkable. Sinuses/Orbits: Polyps or mucous retention cysts are again noted within the maxillary sinuses bilaterally. The paranasal sinuses and mastoid air cells are otherwise clear. Bilateral lens replacements are noted. Globes and orbits are otherwise unremarkable. IMPRESSION: 1. 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 2. Stable mild periventricular T2 hyperintensities bilaterally. This likely reflects the sequela of chronic microvascular ischemia. 3. Polyps or mucous retention cysts within the maxillary sinuses bilaterally. These results were called by telephone at the time of interpretation on 04/04/2023 at 3:19 pm to provider [facility], who verbally acknowledged these results. Electronically Signed   By: Marin Roberts M.D.   On: 04/04/2023 15:21   DG Chest 2 View Result Date: 04/04/2023 CLINICAL DATA:  Shortness of breath. EXAM: CHEST -  2 VIEW COMPARISON:  One-view chest x-ray 06/23/2022 FINDINGS: Heart is enlarged. Atherosclerotic changes are present at the aortic arch. Changes of COPD are present. No edema or effusion is present. No focal airspace disease is present. Thoracic kyphosis is mildly exaggerated. No acute osseous abnormalities are present. IMPRESSION: 1. Cardiomegaly without failure. 2. COPD. Electronically Signed   By:  Marin Roberts M.D.   On: 04/04/2023 13:15    Microbiology: Results for orders placed or performed during the hospital encounter of 04/04/23  Blood culture (routine x 2)     Status: None (Preliminary result)   Collection Time: 04/04/23  1:20 PM   Specimen: BLOOD  Result Value Ref Range Status   Specimen Description BLOOD RIGHT ANTECUBITAL  Final   Special Requests   Final    BOTTLES DRAWN AEROBIC AND ANAEROBIC Blood Culture results may not be optimal due to an inadequate volume of blood received in culture bottles   Culture   Final    NO GROWTH 2 DAYS Performed at [facility], [address] [place], [place] [address]    Report Status PENDING  Incomplete  Resp panel by RT-PCR (RSV, Flu A&B, Covid) Anterior Nasal Swab     Status: None   Collection Time: 04/04/23  1:33 PM   Specimen: Anterior Nasal Swab  Result Value Ref Range Status   SARS Coronavirus 2 by RT PCR NEGATIVE NEGATIVE Final   Influenza A by PCR NEGATIVE NEGATIVE Final   Influenza B by PCR NEGATIVE NEGATIVE Final    Comment: (NOTE) The Xpert Xpress SARS-CoV-2/FLU/RSV plus assay is intended as an aid in the diagnosis of influenza from Nasopharyngeal swab specimens and should not be used as a sole basis for treatment. Nasal washings and aspirates are unacceptable for Xpert Xpress SARS-CoV-2/FLU/RSV testing.  Fact Sheet for Patients: BloggerCourse.com  Fact Sheet for Healthcare Providers: SeriousBroker.it  This test is not yet approved or cleared by the [place] FDA and has been authorized for detection and/or diagnosis of SARS-CoV-2 by FDA under an Emergency Use Authorization (EUA). This EUA will remain in effect (meaning this test can be used) for the duration of the COVID-19 declaration under Section 564(b)(1) of the Act, 21 U.S.C. section 360bbb-3(b)(1), unless the authorization is terminated or revoked.     Resp Syncytial Virus by PCR  NEGATIVE NEGATIVE Final    Comment: (NOTE) Fact Sheet for Patients: BloggerCourse.com  Fact Sheet for Healthcare Providers: SeriousBroker.it  This test is not yet approved or cleared by the [place] FDA and has been authorized for detection and/or diagnosis of SARS-CoV-2 by FDA under an Emergency Use Authorization (EUA). This EUA will remain in effect (meaning this test can be used) for the duration of the COVID-19 declaration under Section 564(b)(1) of the Act, 21 U.S.C. section 360bbb-3(b)(1), unless the authorization is terminated or revoked.  Performed at [facility], [address], [place], [place] [address]   Blood culture (routine x 2)     Status: None (Preliminary result)   Collection Time: 04/04/23  5:20 PM   Specimen: BLOOD RIGHT FOREARM  Result Value Ref Range Status   Specimen Description BLOOD RIGHT FOREARM  Final   Special Requests   Final    BOTTLES DRAWN AEROBIC AND ANAEROBIC Blood Culture results may not be optimal due to an inadequate volume of blood received in culture bottles   Culture   Final    NO GROWTH 2 DAYS Performed at [facility], [address] [place], [place] [address]    Report Status PENDING  Incomplete    Labs: CBC: Recent Labs  Lab 04/04/23 1313 04/06/23 1136  WBC 7.6 7.6  NEUTROABS 4.6 4.0  HGB 9.6* 9.7*  HCT 30.8* 31.3*  MCV 90.3 88.4  PLT 279 280   Basic Metabolic Panel: Recent Labs  Lab 04/04/23 1311 04/04/23 1313 04/05/23 2221 04/06/23 1136  NA  --  140 141 139  K  --  3.6 4.4 4.5  CL  --  105 103 103  CO2  --  25 24 27   GLUCOSE  --  150* 115* 134*  BUN  --  15 15 13   CREATININE  --  0.88 1.01* 0.88  CALCIUM  --  8.6* 9.3 9.0  MG 1.9  --  2.0  --    Liver Function Tests: Recent Labs  Lab 04/04/23 1313  AST 16  ALT 11  ALKPHOS 70  BILITOT 0.4  PROT 6.2*  ALBUMIN 3.6   CBG: Recent Labs  Lab 04/05/23 1104 04/05/23 1656 04/05/23 2114  04/06/23 0622 04/06/23 1118  GLUCAP 118* 114* 124* 139* 142*    Discharge time spent: less than 30 minutes.  Signed: Rickey Barbara, MD [facility] 04/06/2023

## 2023-04-06 NOTE — Hospital Course (Signed)
83 yr old woman who presented to [facility] ED with complaints of left sided weakness and tremors that has become progressively worse. She has also had dizziness and nausea and a right sided headache. Sh also complains of bilateral lower extremitiies right greater than left along with progressive worsening of edema in the legs right greater than left. MRI brain demonstrated an acute/subacute 18 mm infarct of the let superior cerebellum. The patient has been admitted to the hospitalists and neurology has been consulted.    She carries a past medical history of mitral valve prolapse, hypothyroidism, diabetes mellitus 2, dyslipidemia, GERD, unstable angina and CAD with a stent to the LAD and D2 in 2016, left leg sciatica, COPD, hypertension, CKD 2, and large bilateral PE with RV strain in 2023. She has also had a history of lower GI bleeding due to a large colonic AVM treated with APC and uristat.

## 2023-04-06 NOTE — Progress Notes (Signed)
STROKE TEAM PROGRESS NOTE   BRIEF HPI Ms. Desiree Leon is a 83 y.o. female with history of COPD, hypertension, hyperlipidemia, MI, mitral valve prolapse, pulmonary embolism on Eliquis, hypothyroidism, and venous insufficiency presenting with dizziness headache, left-sided weakness and some tremors has been going on for 1 week.  She states her balance has been off and has not gotten better so she came to be evaluated.  She did miss 1 day of her Eliquis recently.  MRI brain with acute/subacute left cerebellum infarct  NIH on Admission 1   SIGNIFICANT HOSPITAL EVENTS MRI brain with acute/subacute left cerebellum infarct  INTERIM HISTORY/SUBJECTIVE Patient's daughter and granddaughter are at the bedside patient is awake alert and oriented in no apparent distress she is on nasal cannula which she wears at home. She states that she still is still experiencing the dizziness and weakness.  She also complains of pain in her right calf.  OBJECTIVE  CBC    Component Value Date/Time   WBC 7.6 04/06/2023 1136   RBC 3.54 (L) 04/06/2023 1136   HGB 9.7 (L) 04/06/2023 1136   HGB 12.7 03/18/2021 1132   HCT 31.3 (L) 04/06/2023 1136   HCT 37.9 03/18/2021 1132   PLT 280 04/06/2023 1136   PLT 245 03/18/2021 1132   MCV 88.4 04/06/2023 1136   MCV 97 03/18/2021 1132   MCH 27.4 04/06/2023 1136   MCHC 31.0 04/06/2023 1136   RDW 16.1 (H) 04/06/2023 1136   RDW 13.5 03/18/2021 1132   LYMPHSABS 3.1 04/06/2023 1136   MONOABS 0.5 04/06/2023 1136   EOSABS 0.1 04/06/2023 1136   BASOSABS 0.0 04/06/2023 1136    BMET    Component Value Date/Time   NA 139 04/06/2023 1136   NA 141 03/18/2021 1132   K 4.5 04/06/2023 1136   CL 103 04/06/2023 1136   CO2 27 04/06/2023 1136   GLUCOSE 134 (H) 04/06/2023 1136   BUN 13 04/06/2023 1136   BUN 15 03/18/2021 1132   CREATININE 0.88 04/06/2023 1136   CALCIUM 9.0 04/06/2023 1136   EGFR 79 03/18/2021 1132   GFRNONAA >60 04/06/2023 1136    IMAGING past 24  hours No results found.   Vitals:   04/06/23 0048 04/06/23 0437 04/06/23 0734 04/06/23 1120  BP: (!) 139/48 (!) 126/46 (!) 139/55 (!) 145/49  Pulse: (!) 54 (!) 56 (!) 53 (!) 57  Resp: 19 17 16 16   Temp: 97.6 F (36.4 C) 97.8 F (36.6 C) 98 F (36.7 C) 98.2 F (36.8 C)  TempSrc:    Oral  SpO2: 99% 100% 100% 100%  Weight:      Height:         PHYSICAL EXAM General:  Alert, pleasant obese elderly Caucasian lady in no acute distress Psych:  Mood and affect appropriate for situation CV: Regular rate and rhythm on monitor Respiratory:  Regular, unlabored respirations on room air GI: Abdomen soft and nontender   NEURO:  Mental Status: AA&Ox3, patient is able to give clear and coherent history Speech/Language: speech is without dysarthria or aphasia.  Naming, repetition, fluency, and comprehension intact.  Cranial Nerves:  II: PERRL. Visual fields full.  III, IV, VI: EOMI. Eyelids elevate symmetrically.  V: Sensation is intact to light touch and symmetrical to face.  VII: Face is symmetrical resting and smiling VIII: hearing intact to voice. IX, X: Palate elevates symmetrically. Phonation is normal.  ZO:[id number] shrug 5/5. XII: tongue is midline without fasciculations. Motor: 5/5 strength to all muscle groups tested.  Tone: is normal and bulk is normal Sensation- Intact to light touch bilaterally. Extinction absent to light touch to DSS.   Coordination: Mild ataxia on right arm mild truncal ataxia and tends to fall backwards. Gait- deferred  Most Recent NIH  1a Level of Conscious.: 0 1b LOC Questions: 0 1c LOC Commands: 0 2 Best Gaze: 0 3 Visual: 0 4 Facial Palsy: 0 5a Motor Arm - left: 0 5b Motor Arm - Right: 0 6a Motor Leg - Left: 0 6b Motor Leg - Right: 0 7 Limb Ataxia: 1 8 Sensory: 0 9 Best Language: 0 10 Dysarthria: 0 11 Extinct. and Inatten.: 0 TOTAL: 1   ASSESSMENT/PLAN  Acute Ischemic Infarct:  left cerebellum  Etiology: Likely cardio embolic CT  head No acute abnormality.  CTA head & neck no LVO MRI  MRI brain with acute/subacute left cerebellum infarct 2D Echo ordered Ultrasound LE pending Recommend 30-day heart monitor or loop recorder LDL 77 HgbA1c 6.4 VTE prophylaxis -on Eliquis Eliquis prior to admission, now on Eliquis.   Therapy recommendations:  Pending Disposition: Pending  Hypertension CAD s/p stent stent Home meds: Metoprolol 25 mg, nicardipine 20 mg, nitroglycerin patch 0.4 mg, Lasix 20 mg Stable Blood Pressure Goal: BP less than 220/110   Hyperlipidemia Home meds: Vytorin 10/20 mg, resumed in hospital LDL 77, goal < 70 Continue statin at discharge  History of pulmonary embolism COPD Chronic hypoxemia On Eliquis 2.5 mg twice daily, hold  On 2 L nasal cannula at home  Dysphagia Patient has post-stroke dysphagia, SLP consulted    Diet   Diet regular Fluid consistency: Thin   Advance diet as tolerated  Other Stroke Risk Factors Obesity, Body mass index is 32.95 kg/m., BMI >/= 30 associated with increased stroke risk, recommend weight loss, diet and exercise as appropriate  Coronary artery disease Congestive heart failure   Other Active Problems CKD GERD History of lower GI bleed due to large colonic AVM  Hospital day # 0   Patient presented with small embolic cerebellar infarct.  She is on low-dose Eliquis 2.5 twice daily for pulmonary embolism given prior history of GI hemorrhage.  Recommend 30-day heart monitor for paroxysmal A-fib and is found may need to consider changing Eliquis to full dose.  Management of medical issues as per primary team.  Stroke team will sign off.  Follow-up with an outpatient stroke clinic in 2 months.  Long discussion patient and daughter and granddaughter at the bedside and answered questions..  Dr. Deno Etienne.  Greater than 50% time during this 35-minute visit spent on counseling and coordination of care and discussion with patient and care team answering  questions.  Delia Heady, MD [facility]   To contact Stroke Continuity provider, please refer to [email or web address]. After hours, contact General Neurology

## 2023-04-06 NOTE — Plan of Care (Signed)

## 2023-04-06 NOTE — TOC Transition Note (Signed)
Transition of Care [facility]) - Discharge Note   Patient Details  Name: Desiree Leon MRN: [id number] Date of Birth: 12-24-1940  Transition of Care [facility]) CM/SW Contact:  Kermit Balo, RN Phone Number: 04/06/2023, 10:53 AM   Clinical Narrative:     Pt is discharging home with home health services through [place]. Information on the AVS. Frances Furbish will contact her for the first home visit. Pts daughter lives next door and her granddaughter lives behind her. Both can check on her frequently.  No new DME needs. Has walker/ shower seat at home. Pt has oxygen at home through [place].  Pt manages her own medications with pill box at home. Daughter is going to assist her after d/c.  Daughter provides needed transportation.   Final next level of care: Home w Home Health Services Barriers to Discharge: No Barriers Identified   Patient Goals and CMS Choice   CMS Medicare.gov Compare Post Acute Care list provided to:: Patient Choice offered to / list presented to : Patient, Adult Children      Discharge Placement                       Discharge Plan and Services Additional resources added to the After Visit Summary for                            [facility] Arranged: PT [facility] Agency: [facility] Date [facility] Agency Contacted: 04/06/23   Representative spoke with at [facility] Agency: Kandee Keen  Social Drivers of Health (SDOH) Interventions SDOH Screenings   Food Insecurity: No Food Insecurity (04/05/2023)  Housing: Low Risk  (04/05/2023)  Transportation Needs: No Transportation Needs (04/05/2023)  Utilities: Not At Risk (04/05/2023)  Social Connections: Socially Isolated (04/05/2023)  Tobacco Use: Medium Risk (04/04/2023)     Readmission Risk Interventions     No data to display

## 2023-04-06 NOTE — Progress Notes (Addendum)
TRH night cross cover note:   I was notified by RN that the patient is complaining of palpitations, monitor reveals frequent PVCs.  RN conveys that the patient's home beta-blocker and additional antihypertensive medications are currently being held observance of permissive hypertension presenting acute ischemic CVA.   While continue to hold the patient's additional home antihypertensive medications, in the setting of the patient's palpitations and frequent PVCs, I have resumed outpatient metoprolol to tartrate, but have reduced the dose to 12.5 mg p.o. twice daily patient's heart rates in the 60s, and have added associated hold parameters, requesting that this medication be held for heart rate less than 60.  Blood pressure appears stable.  I also ordered an updated EKG. Will also check BMP and magnesium levels.     Newton Pigg, DO Hospitalist

## 2023-04-06 NOTE — Plan of Care (Signed)

## 2023-04-07 ENCOUNTER — Other Ambulatory Visit: Payer: Self-pay | Admitting: Cardiology

## 2023-04-07 DIAGNOSIS — I639 Cerebral infarction, unspecified: Secondary | ICD-10-CM

## 2023-04-07 DIAGNOSIS — R42 Dizziness and giddiness: Secondary | ICD-10-CM

## 2023-04-07 DIAGNOSIS — J4489 Other specified chronic obstructive pulmonary disease: Secondary | ICD-10-CM | POA: Diagnosis not present

## 2023-04-07 DIAGNOSIS — D631 Anemia in chronic kidney disease: Secondary | ICD-10-CM | POA: Diagnosis not present

## 2023-04-07 DIAGNOSIS — I130 Hypertensive heart and chronic kidney disease with heart failure and stage 1 through stage 4 chronic kidney disease, or unspecified chronic kidney disease: Secondary | ICD-10-CM | POA: Diagnosis not present

## 2023-04-07 DIAGNOSIS — I69354 Hemiplegia and hemiparesis following cerebral infarction affecting left non-dominant side: Secondary | ICD-10-CM | POA: Diagnosis not present

## 2023-04-07 DIAGNOSIS — I69391 Dysphagia following cerebral infarction: Secondary | ICD-10-CM | POA: Diagnosis not present

## 2023-04-07 DIAGNOSIS — N182 Chronic kidney disease, stage 2 (mild): Secondary | ICD-10-CM | POA: Diagnosis not present

## 2023-04-07 DIAGNOSIS — I480 Paroxysmal atrial fibrillation: Secondary | ICD-10-CM | POA: Diagnosis not present

## 2023-04-07 DIAGNOSIS — I509 Heart failure, unspecified: Secondary | ICD-10-CM | POA: Diagnosis not present

## 2023-04-07 DIAGNOSIS — E1122 Type 2 diabetes mellitus with diabetic chronic kidney disease: Secondary | ICD-10-CM | POA: Diagnosis not present

## 2023-04-07 NOTE — Progress Notes (Signed)
Cardiac monitor ordered for Stroke, Dr. Jacinto Halim to read

## 2023-04-09 DIAGNOSIS — I10 Essential (primary) hypertension: Secondary | ICD-10-CM | POA: Diagnosis not present

## 2023-04-09 DIAGNOSIS — I25119 Atherosclerotic heart disease of native coronary artery with unspecified angina pectoris: Secondary | ICD-10-CM | POA: Diagnosis not present

## 2023-04-09 LAB — CULTURE, BLOOD (ROUTINE X 2)
Culture: NO GROWTH
Culture: NO GROWTH

## 2023-04-10 DIAGNOSIS — N182 Chronic kidney disease, stage 2 (mild): Secondary | ICD-10-CM | POA: Diagnosis not present

## 2023-04-10 DIAGNOSIS — I480 Paroxysmal atrial fibrillation: Secondary | ICD-10-CM | POA: Diagnosis not present

## 2023-04-10 DIAGNOSIS — I509 Heart failure, unspecified: Secondary | ICD-10-CM | POA: Diagnosis not present

## 2023-04-10 DIAGNOSIS — I130 Hypertensive heart and chronic kidney disease with heart failure and stage 1 through stage 4 chronic kidney disease, or unspecified chronic kidney disease: Secondary | ICD-10-CM | POA: Diagnosis not present

## 2023-04-10 DIAGNOSIS — I69354 Hemiplegia and hemiparesis following cerebral infarction affecting left non-dominant side: Secondary | ICD-10-CM | POA: Diagnosis not present

## 2023-04-10 DIAGNOSIS — D631 Anemia in chronic kidney disease: Secondary | ICD-10-CM | POA: Diagnosis not present

## 2023-04-10 DIAGNOSIS — E1122 Type 2 diabetes mellitus with diabetic chronic kidney disease: Secondary | ICD-10-CM | POA: Diagnosis not present

## 2023-04-10 DIAGNOSIS — J4489 Other specified chronic obstructive pulmonary disease: Secondary | ICD-10-CM | POA: Diagnosis not present

## 2023-04-10 DIAGNOSIS — I69391 Dysphagia following cerebral infarction: Secondary | ICD-10-CM | POA: Diagnosis not present

## 2023-04-13 ENCOUNTER — Telehealth: Payer: Self-pay | Admitting: Cardiology

## 2023-04-13 DIAGNOSIS — E039 Hypothyroidism, unspecified: Secondary | ICD-10-CM | POA: Diagnosis not present

## 2023-04-13 DIAGNOSIS — I69354 Hemiplegia and hemiparesis following cerebral infarction affecting left non-dominant side: Secondary | ICD-10-CM | POA: Diagnosis not present

## 2023-04-13 DIAGNOSIS — E785 Hyperlipidemia, unspecified: Secondary | ICD-10-CM | POA: Diagnosis not present

## 2023-04-13 DIAGNOSIS — E1122 Type 2 diabetes mellitus with diabetic chronic kidney disease: Secondary | ICD-10-CM | POA: Diagnosis not present

## 2023-04-13 DIAGNOSIS — I130 Hypertensive heart and chronic kidney disease with heart failure and stage 1 through stage 4 chronic kidney disease, or unspecified chronic kidney disease: Secondary | ICD-10-CM | POA: Diagnosis not present

## 2023-04-13 DIAGNOSIS — I509 Heart failure, unspecified: Secondary | ICD-10-CM | POA: Diagnosis not present

## 2023-04-13 DIAGNOSIS — D631 Anemia in chronic kidney disease: Secondary | ICD-10-CM | POA: Diagnosis not present

## 2023-04-13 DIAGNOSIS — K219 Gastro-esophageal reflux disease without esophagitis: Secondary | ICD-10-CM | POA: Diagnosis not present

## 2023-04-13 DIAGNOSIS — D638 Anemia in other chronic diseases classified elsewhere: Secondary | ICD-10-CM | POA: Diagnosis not present

## 2023-04-13 DIAGNOSIS — J4489 Other specified chronic obstructive pulmonary disease: Secondary | ICD-10-CM | POA: Diagnosis not present

## 2023-04-13 DIAGNOSIS — I480 Paroxysmal atrial fibrillation: Secondary | ICD-10-CM | POA: Diagnosis not present

## 2023-04-13 DIAGNOSIS — I872 Venous insufficiency (chronic) (peripheral): Secondary | ICD-10-CM | POA: Diagnosis not present

## 2023-04-13 DIAGNOSIS — I25110 Atherosclerotic heart disease of native coronary artery with unstable angina pectoris: Secondary | ICD-10-CM | POA: Diagnosis not present

## 2023-04-13 DIAGNOSIS — N182 Chronic kidney disease, stage 2 (mild): Secondary | ICD-10-CM | POA: Diagnosis not present

## 2023-04-13 DIAGNOSIS — E1129 Type 2 diabetes mellitus with other diabetic kidney complication: Secondary | ICD-10-CM | POA: Diagnosis not present

## 2023-04-13 DIAGNOSIS — J449 Chronic obstructive pulmonary disease, unspecified: Secondary | ICD-10-CM | POA: Diagnosis not present

## 2023-04-13 DIAGNOSIS — I69391 Dysphagia following cerebral infarction: Secondary | ICD-10-CM | POA: Diagnosis not present

## 2023-04-13 NOTE — Telephone Encounter (Signed)
I checked with Shelly and monitor is scheduled to be delivered today

## 2023-04-13 NOTE — Telephone Encounter (Signed)
Caller Fleet Contras) returned RN's call.

## 2023-04-13 NOTE — Telephone Encounter (Signed)
 Left message to call office

## 2023-04-13 NOTE — Telephone Encounter (Signed)
Caller Fleet Contras, PCP office) is reporting patient was seen today and has not received her event monitor as yet.  Caller noted patient is still complaining of tightness in left chest, non-radiating pain five times daily lasting seconds to minutes.  Caller stated patient is not taking nitroglycerin and is compliant with nitroglycerin patch.  Caller noted patient has visit scheduled on 2/17.  Caller wants call back to discuss patient status.

## 2023-04-13 NOTE — Telephone Encounter (Signed)
I returned call but was unable to leave a message as phones are now turned over to answering service

## 2023-04-14 DIAGNOSIS — R42 Dizziness and giddiness: Secondary | ICD-10-CM | POA: Diagnosis not present

## 2023-04-14 DIAGNOSIS — I639 Cerebral infarction, unspecified: Secondary | ICD-10-CM

## 2023-04-14 NOTE — Telephone Encounter (Signed)
 I spoke with Vernell.  I let her know monitor was scheduled to be delivered yesterday.  Patient had visit with PCP yesterday and complained of chest pain that has been occurring about 5 times daily.  Lasts a few seconds to a few minutes.  No change in shortness of breath. PCP office is requesting sooner appointment than 2/17.  Appointment made for 2/5 at 10:05 with Orren Fabry, PA.  Vernell will make patient aware of appointment. Office note from PCP to be faxed.

## 2023-04-14 NOTE — Telephone Encounter (Signed)
 Patient was returning call. Please advise ?

## 2023-04-14 NOTE — Progress Notes (Signed)
 Cardiology Office Note:  .   Date:  04/15/2023  ID:  Desiree Leon, DOB Jul 14, 1940, MRN [id number] PCP: Shepard Ade, MD  [facility] HeartCare Providers Cardiologist:  None {   History of Present Illness: .   Desiree Leon is a 83 y.o. female with a past medical history of CAD status post NSTEMI 04/18/2014 with balloon angioplasty of the LAD and D2 with 80% residual stenosis which was not amenable to further intervention due to the LAD being a small artery, mild peripheral arterial disease, left leg sciatica, chronic pain, hypertension, and hyperlipidemia who presents for annual visit.  She was seen in March 2023 and presented with a very large bilateral pulmonary emboli leading to RV strain.  Patient was admitted 04/23/2022 for lower GI bleed and found to have a large colonic AVM treated with APC and Uristat.  Also received blood transfusion.  Anticoagulation was reversed in view of an active GI bleed.  Presented for follow-up in August.  Denied chest pain at that time.  Felt like she had improved much from her hospital discharge.  Even with actively bleeding AVM did not have any frank bloody stools or dark stools and patient did not realize she was bleeding.  Today, she presents with a history of hypertension, coronary artery disease, gastrointestinal bleeding, and recent stroke  with ongoing dizziness and lightheadedness. She attributes these symptoms to her recent stroke and expresses concern about her stability and risk of falling. The patient also reports experiencing tremors, which have become more noticeable since her discharge from the hospital.  In addition to these neurological symptoms, the patient is also dealing with leg pain and swelling. She describes the pain as burning and sharp, and notes that the swelling is particularly severe in her right leg. The patient has a history of skin cancer and underwent surgery for the same, after which she noticed a significant increase in the  redness and swelling of her legs. The patient is currently using a prescribed ointment for the skin condition on her legs, but reports minimal improvement.  The patient's blood count has been a source of concern, with a history of hemoglobin levels dropping to as low as 6, necessitating multiple blood and iron  transfusions. The patient's recent hemoglobin level was 9.5, which is consistent with her level at hospital discharge.  Reports no shortness of breath nor dyspnea on exertion. Reports no chest pain, pressure, or tightness. No edema, orthopnea, PND. Reports no palpitations.   Discussed the use of AI scribe software for clinical note transcription with the patient, who gave verbal consent to proceed.  ROS: Pertinent ROS in HPI  Studies Reviewed: SABRA        Coronary angiogram 04/18/2014:  PTCA and suboptimal balloon angioplasty of mid LAD and D2.  Residual 80% stenosis in the distal LAD not amenable to intervention. LAD collateralized by RCA. Normal LVEF.   Echo- 05/03/14 1. Left ventricle cavity is normal in size. Mild concentric hypertrophy of the left ventricle. Normal global wall motion. Calculated EF 63%. 2. Mild mitral regurgitation. 3. Mild tricuspid regurgitation. No evidence of pulmonary hypertension   Exercise myoview stress 08/11/2014: 1. The resting electrocardiogram demonstrated normal sinus rhythm, normal resting conduction, no resting arrhythmias and normal rest repolarization.  The stress electrocardiogram was normal.   The patient performed treadmill exercise using a Bruce protocol, completing 5:30 minutes.The patient completed an estimated workload of 7.05 METS, 88% of the maximum predicted heart rate.  The stress test was terminated because  of fatigue and THR met. 2. Myocardial perfusion imaging is normal. Overall left ventricular systolic function was normal without regional wall motion abnormalities. The left ventricular ejection fraction was 75%.   Carotid artery duplex  03/12/2020: Duplex suggests stenosis in the right internal carotid artery (1-15%). Duplex suggests stenosis in the left internal carotid artery (minimal). Antegrade right vertebral artery flow. Antegrade left vertebral artery flow. Compared to the study done on 04/10/2020, right ECA stenosis of 15 to 49% now appears to have regressed.       Physical Exam:   VS:  BP 108/60   Pulse 62   Ht 5' 3 (1.6 m)   Wt 185 lb (83.9 kg)   SpO2 97%   BMI 32.77 kg/m    Wt Readings from Last 3 Encounters:  04/15/23 185 lb (83.9 kg)  04/04/23 186 lb (84.4 kg)  10/23/22 186 lb 9.6 oz (84.6 kg)    GEN: Well nourished, well developed in no acute distress NECK: No JVD; No carotid bruits CARDIAC: RRR, no murmurs, rubs, gallops RESPIRATORY:  Clear to auscultation without rales, wheezing or rhonchi  ABDOMEN: Soft, non-tender, non-distended EXTREMITIES:  1-2 + pitting edema with LE dermitis; No deformity   ASSESSMENT AND PLAN: .    Cerebrovascular Accident (CVA) Recent stroke on 04/04/2023, currently on Eliquis  2.5mg  BID. Discussed the risk of stroke vs bleeding with Eliquis  dosing. Patient had a previous GI bleed on 5mg  BID Eliquis , hence the lower dose. -Continue Eliquis  2.5mg  BID. -Communicate with neurology regarding Eliquis  dosing.  Hypotension Reports of lightheadedness and dizziness, possibly related to low blood pressure. Currently on Cardene  20mg  daily. -Consider reducing Cardene  to 10mg  daily to increase blood pressure and alleviate symptoms. -Ensure adequate hydration.  Coronary Artery Disease (CAD) History of heart attack in 2016, not a candidate for stent placement or valve replacement. Currently managed with medications including Cardene  and Ranexa . -Continue current medications. -Consider increasing Ranexa  if Cardene  is reduced, monitor for nausea-she prefers to keep her medications the way they are.  Lower Extremity Edema Painful swelling in lower extremities, possibly related to  venous insufficiency. -Encourage elevation of legs. -Avoid increasing Lasix  due to low blood pressure. -Follow up with dermatology for skin condition management.  Anemia Hemoglobin 9.5, stable since hospital discharge. History of transfusions and iron  infusions. -Monitor hemoglobin levels.  Atrial Fibrillation (Afib) Suspected, awaiting results from 30-day heart monitor. -Follow up with cardiology after completion of heart monitor.  General Health Maintenance / Followup Plans -Follow up with neurology in April/May. -Follow up with cardiology in March to review heart monitor results. -Follow up with dermatology for skin condition management.       Dispo: She can follow-up in a month with Jackee and 3 months with Dr. Ladona  Signed, Orren LOISE Fabry, PA-C

## 2023-04-14 NOTE — Telephone Encounter (Signed)
 Left message to call office

## 2023-04-14 NOTE — Telephone Encounter (Signed)
I placed call to Fleet Contras and Lawson Fiscal is working with Dr Jacky Kindle today.  Message left for Lawson Fiscal that I was returning call and to please call office

## 2023-04-15 ENCOUNTER — Ambulatory Visit: Payer: Medicare PPO | Attending: Physician Assistant | Admitting: Physician Assistant

## 2023-04-15 ENCOUNTER — Encounter: Payer: Self-pay | Admitting: Physician Assistant

## 2023-04-15 VITALS — BP 108/60 | HR 62 | Ht 63.0 in | Wt 185.0 lb

## 2023-04-15 DIAGNOSIS — I69391 Dysphagia following cerebral infarction: Secondary | ICD-10-CM | POA: Diagnosis not present

## 2023-04-15 DIAGNOSIS — I25118 Atherosclerotic heart disease of native coronary artery with other forms of angina pectoris: Secondary | ICD-10-CM | POA: Diagnosis not present

## 2023-04-15 DIAGNOSIS — J4489 Other specified chronic obstructive pulmonary disease: Secondary | ICD-10-CM | POA: Diagnosis not present

## 2023-04-15 DIAGNOSIS — I10 Essential (primary) hypertension: Secondary | ICD-10-CM | POA: Diagnosis not present

## 2023-04-15 DIAGNOSIS — K922 Gastrointestinal hemorrhage, unspecified: Secondary | ICD-10-CM

## 2023-04-15 DIAGNOSIS — Z86711 Personal history of pulmonary embolism: Secondary | ICD-10-CM

## 2023-04-15 DIAGNOSIS — I509 Heart failure, unspecified: Secondary | ICD-10-CM | POA: Diagnosis not present

## 2023-04-15 DIAGNOSIS — E1122 Type 2 diabetes mellitus with diabetic chronic kidney disease: Secondary | ICD-10-CM | POA: Diagnosis not present

## 2023-04-15 DIAGNOSIS — I130 Hypertensive heart and chronic kidney disease with heart failure and stage 1 through stage 4 chronic kidney disease, or unspecified chronic kidney disease: Secondary | ICD-10-CM | POA: Diagnosis not present

## 2023-04-15 DIAGNOSIS — N182 Chronic kidney disease, stage 2 (mild): Secondary | ICD-10-CM | POA: Diagnosis not present

## 2023-04-15 DIAGNOSIS — R0609 Other forms of dyspnea: Secondary | ICD-10-CM

## 2023-04-15 DIAGNOSIS — I69354 Hemiplegia and hemiparesis following cerebral infarction affecting left non-dominant side: Secondary | ICD-10-CM | POA: Diagnosis not present

## 2023-04-15 DIAGNOSIS — D631 Anemia in chronic kidney disease: Secondary | ICD-10-CM | POA: Diagnosis not present

## 2023-04-15 DIAGNOSIS — I480 Paroxysmal atrial fibrillation: Secondary | ICD-10-CM | POA: Diagnosis not present

## 2023-04-15 NOTE — Patient Instructions (Addendum)
 Medication Instructions:  Your physician recommends that you continue on your current medications as directed. Please refer to the Current Medication list given to you today.  *If you need a refill on your cardiac medications before your next appointment, please call your pharmacy*  Lab Work: None ordered If you have labs (blood work) drawn today and your tests are completely normal, you will receive your results only by: MyChart Message (if you have MyChart) OR A paper copy in the mail If you have any lab test that is abnormal or we need to change your treatment, we will call you to review the results.  Follow-Up: At [facility], you and your health needs are our priority.  As part of our continuing mission to provide you with exceptional heart care, we have created designated Provider Care Teams.  These Care Teams include your primary Cardiologist (physician) and Advanced Practice Providers (APPs -  Physician Assistants and Nurse Practitioners) who all work together to provide you with the care you need, when you need it.  Your next appointment:   3 month(s)  Provider:   Dr Ladona  Other Instructions Check your blood pressure daily, 1 hr after morning medications, keep a log of your readings and bring it with you your appointment with Jackee in March.  Heart-Healthy Eating Plan Many factors influence your heart health, including eating and exercise habits. Heart health is also called coronary health. Coronary risk increases with abnormal blood fat (lipid) levels. A heart-healthy eating plan includes limiting unhealthy fats, increasing healthy fats, limiting salt (sodium) intake, and making other diet and lifestyle changes. What is my plan? Your health care provider may recommend that: You limit your fat intake to _________% or less of your total calories each day. You limit your saturated fat intake to _________% or less of your total calories each day. You limit the amount of  cholesterol in your diet to less than _________ mg per day. You limit the amount of sodium in your diet to less than _________ mg per day. What are tips for following this plan? Cooking Cook foods using methods other than frying. Baking, boiling, grilling, and broiling are all good options. Other ways to reduce fat include: Removing the skin from poultry. Removing all visible fats from meats. Steaming vegetables in water or broth. Meal planning  At meals, imagine dividing your plate into fourths: Fill one-half of your plate with vegetables and green salads. Fill one-fourth of your plate with whole grains. Fill one-fourth of your plate with lean protein foods. Eat 2-4 cups of vegetables per day. One cup of vegetables equals 1 cup (91 g) broccoli or cauliflower florets, 2 medium carrots, 1 large bell pepper, 1 large sweet potato, 1 large tomato, 1 medium white potato, 2 cups (150 g) raw leafy greens. Eat 1-2 cups of fruit per day. One cup of fruit equals 1 small apple, 1 large banana, 1 cup (237 g) mixed fruit, 1 large orange,  cup (82 g) dried fruit, 1 cup (240 mL) 100% fruit juice. Eat more foods that contain soluble fiber. Examples include apples, broccoli, carrots, beans, peas, and barley. Aim to get 25-30 g of fiber per day. Increase your consumption of legumes, nuts, and seeds to 4-5 servings per week. One serving of dried beans or legumes equals  cup (90 g) cooked, 1 serving of nuts is  oz (12 almonds, 24 pistachios, or 7 walnut halves), and 1 serving of seeds equals  oz (8 g). Fats Choose healthy  fats more often. Choose monounsaturated and polyunsaturated fats, such as olive and canola oils, avocado oil, flaxseeds, walnuts, almonds, and seeds. Eat more omega-3 fats. Choose salmon, mackerel, sardines, tuna, flaxseed oil, and ground flaxseeds. Aim to eat fish at least 2 times each week. Check food labels carefully to identify foods with trans fats or high amounts of saturated  fat. Limit saturated fats. These are found in animal products, such as meats, butter, and cream. Plant sources of saturated fats include palm oil, palm kernel oil, and coconut oil. Avoid foods with partially hydrogenated oils in them. These contain trans fats. Examples are stick margarine, some tub margarines, cookies, crackers, and other baked goods. Avoid fried foods. General information Eat more home-cooked food and less restaurant, buffet, and fast food. Limit or avoid alcohol . Limit foods that are high in added sugar and simple starches such as foods made using white refined flour (white breads, pastries, sweets). Lose weight if you are overweight. Losing just 5-10% of your body weight can help your overall health and prevent diseases such as diabetes and heart disease. Monitor your sodium intake, especially if you have high blood pressure. Talk with your health care provider about your sodium intake. Try to incorporate more vegetarian meals weekly. What foods should I eat? Fruits All fresh, canned (in natural juice), or frozen fruits. Vegetables Fresh or frozen vegetables (raw, steamed, roasted, or grilled). Green salads. Grains Most grains. Choose whole wheat and whole grains most of the time. Rice and pasta, including brown rice and pastas made with whole wheat. Meats and other proteins Lean, well-trimmed beef, veal, pork, and lamb. Chicken and turkey without skin. All fish and shellfish. Wild duck, rabbit, pheasant, and venison. Egg whites or low-cholesterol egg substitutes. Dried beans, peas, lentils, and tofu. Seeds and most nuts. Dairy Low-fat or nonfat cheeses, including ricotta and mozzarella. Skim or 1% milk (liquid, powdered, or evaporated). Buttermilk made with low-fat milk. Nonfat or low-fat yogurt. Fats and oils Non-hydrogenated (trans-free) margarines. Vegetable oils, including soybean, sesame, sunflower, olive, avocado, peanut, safflower, corn, canola, and cottonseed.  Salad dressings or mayonnaise made with a vegetable oil. Beverages Water (mineral or sparkling). Coffee and tea. Unsweetened ice tea. Diet beverages. Sweets and desserts Sherbet, gelatin, and fruit ice. Small amounts of dark chocolate. Limit all sweets and desserts. Seasonings and condiments All seasonings and condiments. The items listed above may not be a complete list of foods and beverages you can eat. Contact a dietitian for more options. What foods should I avoid? Fruits Canned fruit in heavy syrup. Fruit in cream or butter sauce. Fried fruit. Limit coconut. Vegetables Vegetables cooked in cheese, cream, or butter sauce. Fried vegetables. Grains Breads made with saturated or trans fats, oils, or whole milk. Croissants. Sweet rolls. Donuts. High-fat crackers, such as cheese crackers and chips. Meats and other proteins Fatty meats, such as hot dogs, ribs, sausage, bacon, rib-eye roast or steak. High-fat deli meats, such as salami and bologna. Caviar. Domestic duck and goose. Organ meats, such as liver. Dairy Cream, sour cream, cream cheese, and creamed cottage cheese. Whole-milk cheeses. Whole or 2% milk (liquid, evaporated, or condensed). Whole buttermilk. Cream sauce or high-fat cheese sauce. Whole-milk yogurt. Fats and oils Meat fat, or shortening. Cocoa butter, hydrogenated oils, palm oil, coconut oil, palm kernel oil. Solid fats and shortenings, including bacon fat, salt pork, lard, and butter. Nondairy cream substitutes. Salad dressings with cheese or sour cream. Beverages Regular sodas and any drinks with added sugar. Sweets and desserts Frosting. Pudding.  Cookies. Cakes. Pies. Milk chocolate or white chocolate. Buttered syrups. Full-fat ice cream or ice cream drinks. The items listed above may not be a complete list of foods and beverages to avoid. Contact a dietitian for more information. Summary Heart-healthy meal planning includes limiting unhealthy fats, increasing healthy  fats, limiting salt (sodium) intake and making other diet and lifestyle changes. Lose weight if you are overweight. Losing just 5-10% of your body weight can help your overall health and prevent diseases such as diabetes and heart disease. Focus on eating a balance of foods, including fruits and vegetables, low-fat or nonfat dairy, lean protein, nuts and legumes, whole grains, and heart-healthy oils and fats. This information is not intended to replace advice given to you by your health care provider. Make sure you discuss any questions you have with your health care provider. Document Revised: 04/01/2021 Document Reviewed: 04/01/2021 Elsevier Patient Education  2024 [employer]  Low-Sodium Eating Plan Salt (sodium) helps you keep a healthy balance of fluids in your body. Too much sodium can raise your blood pressure. It can also cause fluid and waste to be held in your body. Your health care provider or dietitian may recommend a low-sodium eating plan if you have high blood pressure (hypertension), kidney disease, liver disease, or heart failure. Eating less sodium can help lower your blood pressure and reduce swelling. It can also protect your heart, liver, and kidneys. What are tips for following this plan? Reading food labels  Check food labels for the amount of sodium per serving. If you eat more than one serving, you must multiply the listed amount by the number of servings. Choose foods with less than 140 milligrams (mg) of sodium per serving. Avoid foods with 300 mg of sodium or more per serving. Always check how much sodium is in a product, even if the label says unsalted or no salt added. Shopping  Buy products labeled as low-sodium or no salt added. Buy fresh foods. Avoid canned foods and pre-made or frozen meals. Avoid canned, cured, or processed meats. Buy breads that have less than 80 mg of sodium per slice. Cooking  Eat more home-cooked food. Try to eat less  restaurant, buffet, and fast food. Try not to add salt when you cook. Use salt-free seasonings or herbs instead of table salt or sea salt. Check with your provider or pharmacist before using salt substitutes. Cook with plant-based oils, such as canola, sunflower, or olive oil. Meal planning When eating at a restaurant, ask if your food can be made with less salt or no salt. Avoid dishes labeled as brined, pickled, cured, or smoked. Avoid dishes made with soy sauce, miso, or teriyaki sauce. Avoid foods that have monosodium glutamate (MSG) in them. MSG may be added to some restaurant food, sauces, soups, bouillon, and canned foods. Make meals that can be grilled, baked, poached, roasted, or steamed. These are often made with less sodium. General information Try to limit your sodium intake to 1,500-2,300 mg each day, or the amount told by your provider. What foods should I eat? Fruits Fresh, frozen, or canned fruit. Fruit juice. Vegetables Fresh or frozen vegetables. No salt added canned vegetables. No salt added tomato sauce and paste. Low-sodium or reduced-sodium tomato and vegetable juice. Grains Low-sodium cereals, such as oats, puffed wheat and rice, and shredded wheat. Low-sodium crackers. Unsalted rice. Unsalted pasta. Low-sodium bread. Whole grain breads and whole grain pasta. Meats and other proteins Fresh or frozen meat, poultry, seafood, and fish. These should  have no added salt. Low-sodium canned tuna and salmon. Unsalted nuts. Dried peas, beans, and lentils without added salt. Unsalted canned beans. Eggs. Unsalted nut butters. Dairy Milk. Soy milk. Cheese that is naturally low in sodium, such as ricotta cheese, fresh mozzarella, or Swiss cheese. Low-sodium or reduced-sodium cheese. Cream cheese. Yogurt. Seasonings and condiments Fresh and dried herbs and spices. Salt-free seasonings. Low-sodium mustard and ketchup. Sodium-free salad dressing. Sodium-free light mayonnaise. Fresh or  refrigerated horseradish. Lemon juice. Vinegar. Other foods Homemade, reduced-sodium, or low-sodium soups. Unsalted popcorn and pretzels. Low-salt or salt-free chips. The items listed above may not be all the foods and drinks you can have. Talk to a dietitian to learn more. What foods should I avoid? Vegetables Sauerkraut, pickled vegetables, and relishes. Olives. French fries. Onion rings. Regular canned vegetables, except low-sodium or reduced-sodium items. Regular canned tomato sauce and paste. Regular tomato and vegetable juice. Frozen vegetables in sauces. Grains Instant hot cereals. Bread stuffing, pancake, and biscuit mixes. Croutons. Seasoned rice or pasta mixes. Noodle soup cups. Boxed or frozen macaroni and cheese. Regular salted crackers. Self-rising flour. Meats and other proteins Meat or fish that is salted, canned, smoked, spiced, or pickled. Precooked or cured meat, such as sausages or meat loaves. [place]. Ham. Pepperoni. Hot dogs. Corned beef. Chipped beef. Salt pork. Jerky. Pickled herring, anchovies, and sardines. Regular canned tuna. Salted nuts. Dairy Processed cheese and cheese spreads. Hard cheeses. Cheese curds. Blue cheese. Feta cheese. String cheese. Regular cottage cheese. Buttermilk. Canned milk. Fats and oils Salted butter. Regular margarine. Ghee. Bacon fat. Seasonings and condiments Onion salt, garlic salt, seasoned salt, table salt, and sea salt. Canned and packaged gravies. Worcestershire sauce. Tartar sauce. Barbecue sauce. Teriyaki sauce. Soy sauce, including reduced-sodium soy sauce. Steak sauce. Fish sauce. Oyster sauce. Cocktail sauce. Horseradish that you find on the shelf. Regular ketchup and mustard. Meat flavorings and tenderizers. Bouillon cubes. Hot sauce. Pre-made or packaged marinades. Pre-made or packaged taco seasonings. Relishes. Regular salad dressings. Salsa. Other foods Salted popcorn and pretzels. Corn chips and puffs. Potato and tortilla chips.  Canned or dried soups. Pizza. Frozen entrees and pot pies. The items listed above may not be all the foods and drinks you should avoid. Talk to a dietitian to learn more. This information is not intended to replace advice given to you by your health care provider. Make sure you discuss any questions you have with your health care provider. Document Revised: 03/13/2022 Document Reviewed: 03/13/2022 Elsevier Patient Education  2024 [employer].

## 2023-04-16 DIAGNOSIS — N182 Chronic kidney disease, stage 2 (mild): Secondary | ICD-10-CM | POA: Diagnosis not present

## 2023-04-16 DIAGNOSIS — I480 Paroxysmal atrial fibrillation: Secondary | ICD-10-CM | POA: Diagnosis not present

## 2023-04-16 DIAGNOSIS — J4489 Other specified chronic obstructive pulmonary disease: Secondary | ICD-10-CM | POA: Diagnosis not present

## 2023-04-16 DIAGNOSIS — E1122 Type 2 diabetes mellitus with diabetic chronic kidney disease: Secondary | ICD-10-CM | POA: Diagnosis not present

## 2023-04-16 DIAGNOSIS — I69391 Dysphagia following cerebral infarction: Secondary | ICD-10-CM | POA: Diagnosis not present

## 2023-04-16 DIAGNOSIS — I69354 Hemiplegia and hemiparesis following cerebral infarction affecting left non-dominant side: Secondary | ICD-10-CM | POA: Diagnosis not present

## 2023-04-16 DIAGNOSIS — I130 Hypertensive heart and chronic kidney disease with heart failure and stage 1 through stage 4 chronic kidney disease, or unspecified chronic kidney disease: Secondary | ICD-10-CM | POA: Diagnosis not present

## 2023-04-16 DIAGNOSIS — D631 Anemia in chronic kidney disease: Secondary | ICD-10-CM | POA: Diagnosis not present

## 2023-04-16 DIAGNOSIS — I509 Heart failure, unspecified: Secondary | ICD-10-CM | POA: Diagnosis not present

## 2023-04-17 DIAGNOSIS — I2609 Other pulmonary embolism with acute cor pulmonale: Secondary | ICD-10-CM | POA: Diagnosis not present

## 2023-04-17 DIAGNOSIS — Z9981 Dependence on supplemental oxygen: Secondary | ICD-10-CM | POA: Diagnosis not present

## 2023-04-17 DIAGNOSIS — J449 Chronic obstructive pulmonary disease, unspecified: Secondary | ICD-10-CM | POA: Diagnosis not present

## 2023-04-20 DIAGNOSIS — D631 Anemia in chronic kidney disease: Secondary | ICD-10-CM | POA: Diagnosis not present

## 2023-04-20 DIAGNOSIS — I69354 Hemiplegia and hemiparesis following cerebral infarction affecting left non-dominant side: Secondary | ICD-10-CM | POA: Diagnosis not present

## 2023-04-20 DIAGNOSIS — J4489 Other specified chronic obstructive pulmonary disease: Secondary | ICD-10-CM | POA: Diagnosis not present

## 2023-04-20 DIAGNOSIS — I509 Heart failure, unspecified: Secondary | ICD-10-CM | POA: Diagnosis not present

## 2023-04-20 DIAGNOSIS — I69391 Dysphagia following cerebral infarction: Secondary | ICD-10-CM | POA: Diagnosis not present

## 2023-04-20 DIAGNOSIS — E1122 Type 2 diabetes mellitus with diabetic chronic kidney disease: Secondary | ICD-10-CM | POA: Diagnosis not present

## 2023-04-20 DIAGNOSIS — I480 Paroxysmal atrial fibrillation: Secondary | ICD-10-CM | POA: Diagnosis not present

## 2023-04-20 DIAGNOSIS — I130 Hypertensive heart and chronic kidney disease with heart failure and stage 1 through stage 4 chronic kidney disease, or unspecified chronic kidney disease: Secondary | ICD-10-CM | POA: Diagnosis not present

## 2023-04-20 DIAGNOSIS — N182 Chronic kidney disease, stage 2 (mild): Secondary | ICD-10-CM | POA: Diagnosis not present

## 2023-04-23 DIAGNOSIS — E1122 Type 2 diabetes mellitus with diabetic chronic kidney disease: Secondary | ICD-10-CM | POA: Diagnosis not present

## 2023-04-23 DIAGNOSIS — I480 Paroxysmal atrial fibrillation: Secondary | ICD-10-CM | POA: Diagnosis not present

## 2023-04-23 DIAGNOSIS — J4489 Other specified chronic obstructive pulmonary disease: Secondary | ICD-10-CM | POA: Diagnosis not present

## 2023-04-23 DIAGNOSIS — I130 Hypertensive heart and chronic kidney disease with heart failure and stage 1 through stage 4 chronic kidney disease, or unspecified chronic kidney disease: Secondary | ICD-10-CM | POA: Diagnosis not present

## 2023-04-23 DIAGNOSIS — I509 Heart failure, unspecified: Secondary | ICD-10-CM | POA: Diagnosis not present

## 2023-04-23 DIAGNOSIS — I69354 Hemiplegia and hemiparesis following cerebral infarction affecting left non-dominant side: Secondary | ICD-10-CM | POA: Diagnosis not present

## 2023-04-23 DIAGNOSIS — D631 Anemia in chronic kidney disease: Secondary | ICD-10-CM | POA: Diagnosis not present

## 2023-04-23 DIAGNOSIS — I69391 Dysphagia following cerebral infarction: Secondary | ICD-10-CM | POA: Diagnosis not present

## 2023-04-23 DIAGNOSIS — N182 Chronic kidney disease, stage 2 (mild): Secondary | ICD-10-CM | POA: Diagnosis not present

## 2023-04-27 ENCOUNTER — Ambulatory Visit: Payer: Medicare PPO | Admitting: Cardiology

## 2023-04-27 DIAGNOSIS — I480 Paroxysmal atrial fibrillation: Secondary | ICD-10-CM | POA: Diagnosis not present

## 2023-04-27 DIAGNOSIS — I509 Heart failure, unspecified: Secondary | ICD-10-CM | POA: Diagnosis not present

## 2023-04-27 DIAGNOSIS — I130 Hypertensive heart and chronic kidney disease with heart failure and stage 1 through stage 4 chronic kidney disease, or unspecified chronic kidney disease: Secondary | ICD-10-CM | POA: Diagnosis not present

## 2023-04-27 DIAGNOSIS — I69391 Dysphagia following cerebral infarction: Secondary | ICD-10-CM | POA: Diagnosis not present

## 2023-04-27 DIAGNOSIS — N182 Chronic kidney disease, stage 2 (mild): Secondary | ICD-10-CM | POA: Diagnosis not present

## 2023-04-27 DIAGNOSIS — D631 Anemia in chronic kidney disease: Secondary | ICD-10-CM | POA: Diagnosis not present

## 2023-04-27 DIAGNOSIS — I69354 Hemiplegia and hemiparesis following cerebral infarction affecting left non-dominant side: Secondary | ICD-10-CM | POA: Diagnosis not present

## 2023-04-27 DIAGNOSIS — J4489 Other specified chronic obstructive pulmonary disease: Secondary | ICD-10-CM | POA: Diagnosis not present

## 2023-04-27 DIAGNOSIS — E1122 Type 2 diabetes mellitus with diabetic chronic kidney disease: Secondary | ICD-10-CM | POA: Diagnosis not present

## 2023-05-07 DIAGNOSIS — I130 Hypertensive heart and chronic kidney disease with heart failure and stage 1 through stage 4 chronic kidney disease, or unspecified chronic kidney disease: Secondary | ICD-10-CM | POA: Diagnosis not present

## 2023-05-07 DIAGNOSIS — I480 Paroxysmal atrial fibrillation: Secondary | ICD-10-CM | POA: Diagnosis not present

## 2023-05-07 DIAGNOSIS — I69354 Hemiplegia and hemiparesis following cerebral infarction affecting left non-dominant side: Secondary | ICD-10-CM | POA: Diagnosis not present

## 2023-05-07 DIAGNOSIS — I69391 Dysphagia following cerebral infarction: Secondary | ICD-10-CM | POA: Diagnosis not present

## 2023-05-07 DIAGNOSIS — J4489 Other specified chronic obstructive pulmonary disease: Secondary | ICD-10-CM | POA: Diagnosis not present

## 2023-05-07 DIAGNOSIS — E1122 Type 2 diabetes mellitus with diabetic chronic kidney disease: Secondary | ICD-10-CM | POA: Diagnosis not present

## 2023-05-07 DIAGNOSIS — I509 Heart failure, unspecified: Secondary | ICD-10-CM | POA: Diagnosis not present

## 2023-05-07 DIAGNOSIS — D631 Anemia in chronic kidney disease: Secondary | ICD-10-CM | POA: Diagnosis not present

## 2023-05-07 DIAGNOSIS — N182 Chronic kidney disease, stage 2 (mild): Secondary | ICD-10-CM | POA: Diagnosis not present

## 2023-05-08 DIAGNOSIS — I25119 Atherosclerotic heart disease of native coronary artery with unspecified angina pectoris: Secondary | ICD-10-CM | POA: Diagnosis not present

## 2023-05-08 DIAGNOSIS — I10 Essential (primary) hypertension: Secondary | ICD-10-CM | POA: Diagnosis not present

## 2023-05-12 DIAGNOSIS — D631 Anemia in chronic kidney disease: Secondary | ICD-10-CM | POA: Diagnosis not present

## 2023-05-12 DIAGNOSIS — I130 Hypertensive heart and chronic kidney disease with heart failure and stage 1 through stage 4 chronic kidney disease, or unspecified chronic kidney disease: Secondary | ICD-10-CM | POA: Diagnosis not present

## 2023-05-12 DIAGNOSIS — I509 Heart failure, unspecified: Secondary | ICD-10-CM | POA: Diagnosis not present

## 2023-05-12 DIAGNOSIS — N182 Chronic kidney disease, stage 2 (mild): Secondary | ICD-10-CM | POA: Diagnosis not present

## 2023-05-12 DIAGNOSIS — I480 Paroxysmal atrial fibrillation: Secondary | ICD-10-CM | POA: Diagnosis not present

## 2023-05-12 DIAGNOSIS — E1122 Type 2 diabetes mellitus with diabetic chronic kidney disease: Secondary | ICD-10-CM | POA: Diagnosis not present

## 2023-05-12 DIAGNOSIS — J4489 Other specified chronic obstructive pulmonary disease: Secondary | ICD-10-CM | POA: Diagnosis not present

## 2023-05-12 DIAGNOSIS — I69354 Hemiplegia and hemiparesis following cerebral infarction affecting left non-dominant side: Secondary | ICD-10-CM | POA: Diagnosis not present

## 2023-05-12 DIAGNOSIS — I69391 Dysphagia following cerebral infarction: Secondary | ICD-10-CM | POA: Diagnosis not present

## 2023-05-15 ENCOUNTER — Ambulatory Visit: Attending: Cardiology

## 2023-05-15 DIAGNOSIS — I2609 Other pulmonary embolism with acute cor pulmonale: Secondary | ICD-10-CM | POA: Diagnosis not present

## 2023-05-15 DIAGNOSIS — R42 Dizziness and giddiness: Secondary | ICD-10-CM

## 2023-05-15 DIAGNOSIS — J449 Chronic obstructive pulmonary disease, unspecified: Secondary | ICD-10-CM | POA: Diagnosis not present

## 2023-05-15 DIAGNOSIS — Z9981 Dependence on supplemental oxygen: Secondary | ICD-10-CM | POA: Diagnosis not present

## 2023-05-15 DIAGNOSIS — I639 Cerebral infarction, unspecified: Secondary | ICD-10-CM

## 2023-05-19 ENCOUNTER — Ambulatory Visit: Payer: Medicare PPO | Admitting: Cardiology

## 2023-05-22 ENCOUNTER — Other Ambulatory Visit: Payer: Self-pay | Admitting: Cardiology

## 2023-05-22 DIAGNOSIS — I25118 Atherosclerotic heart disease of native coronary artery with other forms of angina pectoris: Secondary | ICD-10-CM

## 2023-05-22 DIAGNOSIS — I10 Essential (primary) hypertension: Secondary | ICD-10-CM

## 2023-05-24 NOTE — Progress Notes (Unsigned)
 Cardiology Office Note    Patient Name: Desiree Leon Date of Encounter: 05/24/2023  Primary Care Provider:  Geoffry Paradise, MD Primary Cardiologist:  None Primary Electrophysiologist: None   Past Medical History    Past Medical History:  Diagnosis Date   Anxiety    Blood transfusion    CAP (community acquired pneumonia)    COPD (chronic obstructive pulmonary disease) (HCC)    Heart murmur    History of colon polyps    Hyperlipidemia    Hypertension    MI (myocardial infarction) [facility])    Mitral valve prolapse    NSTEMI (non-ST elevated myocardial infarction) (HCC)    Osteoarthritis    Osteoporosis    Pulmonary emboli (HCC) 04/12/2019   Spinal stenosis    Thyroid disease    hypothyroidism   Unstable angina (HCC) 05/08/2014   Venous insufficiency     History of Present Illness  Desiree Leon is a 83 y.o. female with a PMH of CAD s/p NSTEMI 04/2014 with balloon angioplasty of an D2 with 80% residual stenosis, HTN, HLD, MVP, bilateral PE 04/2019, history of DVT (on Eliquis) history of GI bleed due to AVM's, anxiety, COPD, CVA presents today for post hospital follow-up and evaluation of 30-day event monitor.  Desiree Leon has been followed by Dr. Jacinto Halim since 2016 for management of CAD and NSTEMI that occurred in 2016 with balloon angioplasty of D2 and 80% residual stenosis unable to be further intervention due to small artery size.  She was admitted in 2021 with bilateral PE in the setting of pneumonia was placed on antibiotics and discharged on Eliquis which was discontinued after 1 year.  Pleated 2D echo that showed EF of 50-65% with grade 2 DD and moderate MR with mild to moderate TR and moderate pulmonary HTN.  She was seen by Dr. Jacinto Halim on 05/23/2021 with complaint of chest pain and was found to have large bilateral PE with RV strain.  She was started on Eliquis for life due to unprovoked PE.  She was admitted on 04/24/2022 with shortness of breath and found to have hemoglobin of  6.1 with positive FOBT.  Dilated by GI with Eliquis placed on hold and colonoscopy completed showing large AVM that was treated with APC and PuraStat.  She was admitted on 04/04/2023 with complaint of dizziness and nausea with right-sided headache.  She also noted worsening edema in the legs with right greater than left.  She underwent an MRI of the brain that demonstrated an acute/subacute 18 mm infarct of the left superior cerebellum.  She had a CTA of the head and neck that demonstrated no abnormalities.  She had a 2D echo completed that showed EF of 60-65% and was discharged with 30-day event monitor.  She was seen by Jari Favre on 04/15/2023 for posthospital follow-up.  She was found to have hypotension and had Cardene decreased to 10 mg and denied any episodes of chest pain.  She also noted lower extremity pain and edema with Lasix not increased due to low BP.  30-day event monitor results showed predominantly sinus rhythm with no evidence of AF or malignant arrhythmias detected.  Desiree Leon presents today with her daughter for 1 month follow-up.  During today's visit she reports no episodes of palpitations or chest discomfort.  She has been compliant with her current medications and notes no adverse reactions.  Her blood pressure today was stable at 120/60 and heart rate 64 bpm.  During today's visit we reviewed the results  of of her 30-day event monitor and patient had all questions answered to her satisfaction.  During today's visit she also reported experiencing some chest pressure and shortness of breath while picking weeds without wearing her nasal cannula O2.  She reports resolution of symptoms after wearing oxygen and resting.  We discussed the testing alternatives which included nuclear stress test however patient declined at this time.  She was in favor of pursuing a coronary CTA.  Patient denies palpitations, dyspnea, PND, orthopnea, nausea, vomiting, dizziness, syncope, edema, weight gain, or early  satiety.  Review of Systems  Please see the history of present illness.    All other systems reviewed and are otherwise negative except as noted above.  Physical Exam    Wt Readings from Last 3 Encounters:  04/15/23 185 lb (83.9 kg)  04/04/23 186 lb (84.4 kg)  10/23/22 186 lb 9.6 oz (84.6 kg)   [id number] were no vitals filed for this visit.,There is no height or weight on file to calculate BMI. GEN: Well nourished, well developed in no acute distress Neck: No JVD; No carotid bruits Pulmonary: Clear to auscultation without rales, wheezing or rhonchi  Cardiovascular: Normal rate. Regular rhythm. Normal S1. Normal S2.   Murmurs: There is no murmur.  ABDOMEN: Soft, non-tender, non-distended EXTREMITIES:  No edema; No deformity   EKG/LABS/ Recent Cardiac Studies   ECG personally reviewed by me today -none completed today  Risk Assessment/Calculations:       Lab Results  Component Value Date   WBC 7.6 04/06/2023   HGB 9.7 (L) 04/06/2023   HCT 31.3 (L) 04/06/2023   MCV 88.4 04/06/2023   PLT 280 04/06/2023   Lab Results  Component Value Date   CREATININE 0.88 04/06/2023   BUN 13 04/06/2023   NA 139 04/06/2023   K 4.5 04/06/2023   CL 103 04/06/2023   CO2 27 04/06/2023   Lab Results  Component Value Date   CHOL 141 04/05/2023   HDL 43 04/05/2023   LDLCALC 77 04/05/2023   TRIG 107 04/05/2023   CHOLHDL 3.3 04/05/2023    Lab Results  Component Value Date   HGBA1C 6.4 (H) 04/04/2023   Assessment & Plan    1.  History of CVA: -s/p acute/subacute 18 mm infarct of the left superior cerebellum -30-day event monitor worn to rule out AF as a cause that showed no evidence of AF or malignant rhythms -Continue metoprolol 12.5 mg twice daily -Eliquis 2.5 mg twice daily -Patient scheduled to follow-up with cardiology next month.  2.  History of CAD: -Patient has a history of NSTEMI with balloon angioplasty and 80% residual stenosis due to small vessel size. -She reports  experiencing shortness of breath and chest pressure while bending over and pulling weeds at home. -We will complete coronary CTA due to patient's intolerance to Myoview. -Patient was advised to go to the ED if chest pain returns and is not relieved with as needed Nitrostat  3.  Primary hypertension: -Patient's blood pressure today was stable at 120/60 -Continue Cardene 20 mg daily -Continue metoprolol 12.5 mg daily  4.  History of DVT: -History of unprovoked DVT with right heart strain -Continue Eliquis 2.5 mg twice daily  5.  Lower extremity edema: -Patient has +2 lower extremity edema bilateral -Continue Lasix 20 mg daily  6.  Hypotension: -Patient reports bouts of lightheadedness and dizziness that occur in the midday. -We will decrease metoprolol to 12.5 mg daily -Continue Cardene 20 mg daily  Disposition: Follow-up with  Dr. Jacinto Halim as scheduled   Signed, Napoleon Form, Leodis Rains, NP 05/24/2023, 2:18 PM [facility]

## 2023-05-25 ENCOUNTER — Ambulatory Visit: Payer: Medicare PPO | Attending: Nurse Practitioner | Admitting: Nurse Practitioner

## 2023-05-25 ENCOUNTER — Encounter: Payer: Self-pay | Admitting: Nurse Practitioner

## 2023-05-25 ENCOUNTER — Encounter: Payer: Self-pay | Admitting: Cardiology

## 2023-05-25 VITALS — BP 120/60 | HR 64 | Ht 63.0 in | Wt 187.6 lb

## 2023-05-25 DIAGNOSIS — R2243 Localized swelling, mass and lump, lower limb, bilateral: Secondary | ICD-10-CM

## 2023-05-25 DIAGNOSIS — I639 Cerebral infarction, unspecified: Secondary | ICD-10-CM | POA: Diagnosis not present

## 2023-05-25 DIAGNOSIS — Z79899 Other long term (current) drug therapy: Secondary | ICD-10-CM

## 2023-05-25 DIAGNOSIS — I25118 Atherosclerotic heart disease of native coronary artery with other forms of angina pectoris: Secondary | ICD-10-CM

## 2023-05-25 DIAGNOSIS — I10 Essential (primary) hypertension: Secondary | ICD-10-CM

## 2023-05-25 MED ORDER — METOPROLOL TARTRATE 25 MG PO TABS: 12.5000 mg | ORAL_TABLET | Freq: Every day | ORAL | 0 refills | Status: DC

## 2023-05-25 NOTE — Progress Notes (Signed)
 Please let the patient know that she had no atrial fibrillation on cardiac telemetry, no other significant arrhythmias.

## 2023-05-25 NOTE — Patient Instructions (Addendum)
 Medication Instructions:  DECREASE Metoprolol to 12.5mg  Take 1 tablet once a day (tablet comes in 25mg , so you will need to break the tablet in half) *If you need a refill on your cardiac medications before your next appointment, please call your pharmacy*   Lab Work: None Ordered If you have labs (blood work) drawn today and your tests are completely normal, you will receive your results only by: MyChart Message (if you have MyChart) OR A paper copy in the mail If you have any lab test that is abnormal or we need to change your treatment, we will call you to review the results.   Testing/Procedures: Coronary CTA    Follow-Up: At [facility], you and your health needs are our priority.  As part of our continuing mission to provide you with exceptional heart care, we have created designated Provider Care Teams.  These Care Teams include your primary Cardiologist (physician) and Advanced Practice Providers (APPs -  Physician Assistants and Nurse Practitioners) who all work together to provide you with the care you need, when you need it.  We recommend signing up for the patient portal called "MyChart".  Sign up information is provided on this After Visit Summary.  MyChart is used to connect with patients for Virtual Visits (Telemedicine).  Patients are able to view lab/test results, encounter notes, upcoming appointments, etc.  Non-urgent messages can be sent to your provider as well.   To learn more about what you can do with MyChart, go to [email or web address].    Your next appointment:   FOLLOW UP AS SCHEDULED   Provider:    Yates Decamp, MD  Other Instructions GET SOME ZIPPED COMPRESSIONS  Check your blood pressure daily for 2 weeks, then contact the office with your readings.  Contact the office either by phone or MyChart with your readings.  Make sure to check your blood pressure 2 hours after taking your medications.   AVOID these things for 30 minutes before  checking your blood pressure: No Drinking caffeine. No Drinking alcohol. No Eating. No Smoking. No Exercising.  Five minutes before checking your blood pressure: Pee. Sit in a dining chair. Avoid sitting in a soft couch or armchair. Be quiet. Do not talk.    Your cardiac CT will be scheduled at one of the below locations:   [facility] [address] [place], [place] [address] [phone]   If scheduled at [facility], please arrive at the [facility] Entrance (Entrance C2) of [facility] 30 minutes prior to test start time. You can use the FREE valet parking offered at entrance C (encouraged to control the heart rate for the test)  Proceed to the [facility] Radiology Department (first floor) to check-in and test prep.  All radiology patients and guests should use entrance C2 at [facility], accessed from [facility], even though the hospital's physical address listed is [address].    If scheduled at [facility] or [facility], please arrive 15 mins early for check-in and test prep.  There is spacious parking and easy access to the radiology department from the [facility] V[facility] entrance. Please enter here and check-in with the desk attendant.   If scheduled at [facility], please arrive 30 minutes early for check-in and test prep.  Please follow these instructions carefully (unless otherwise directed):  An IV will be required for this test and Nitroglycerin will be  given.  Hold all erectile dysfunction medications at least 3 days (72 hrs) prior to test. (Ie viagra, cialis, sildenafil, tadalafil, etc)   On the Night Before the Test: Be sure to Drink plenty of water. Do not consume any caffeinated/decaffeinated beverages or chocolate 12 hours prior to your test. Do not take any antihistamines 12 hours prior to your test.  On the Day of  the Test: Drink plenty of water until 1 hour prior to the test. Do not eat any food 1 hour prior to test. You may take your regular medications prior to the test.  Take metoprolol (Lopressor) 25mg  two hours prior to test. If you take Furosemide/Hydrochlorothiazide/Spironolactone/Chlorthalidone, please HOLD on the morning of the test. Patients who wear a continuous glucose monitor MUST remove the device prior to scanning. FEMALES- please wear underwire-free bra if available, avoid dresses & tight clothing      After the Test: Drink plenty of water. After receiving IV contrast, you may experience a mild flushed feeling. This is normal. On occasion, you may experience a mild rash up to 24 hours after the test. This is not dangerous. If this occurs, you can take Benadryl 25 mg, Zyrtec, Claritin, or Allegra and increase your fluid intake. (Patients taking Tikosyn should avoid Benadryl, and may take Zyrtec, Claritin, or Allegra) If you experience trouble breathing, this can be serious. If it is severe call 911 IMMEDIATELY. If it is mild, please call our office.  We will call to schedule your test 2-4 weeks out understanding that some insurance companies will need an authorization prior to the service being performed.   For more information and frequently asked questions, please visit our website : [email or web address]  For non-scheduling related questions, please contact the cardiac imaging nurse navigator should you have any questions/concerns: Cardiac Imaging Nurse Navigators Direct Office Dial: [phone]   For scheduling needs, including cancellations and rescheduling, please call [place], [phone].

## 2023-05-25 NOTE — Addendum Note (Signed)
 Addended by: Alveta Heimlich on: 05/25/2023 04:42 PM   Modules accepted: Orders

## 2023-06-01 ENCOUNTER — Other Ambulatory Visit: Payer: Self-pay

## 2023-06-01 DIAGNOSIS — I872 Venous insufficiency (chronic) (peripheral): Secondary | ICD-10-CM

## 2023-06-05 ENCOUNTER — Encounter (HOSPITAL_COMMUNITY): Payer: Self-pay

## 2023-06-07 DIAGNOSIS — I10 Essential (primary) hypertension: Secondary | ICD-10-CM | POA: Diagnosis not present

## 2023-06-07 DIAGNOSIS — I25119 Atherosclerotic heart disease of native coronary artery with unspecified angina pectoris: Secondary | ICD-10-CM | POA: Diagnosis not present

## 2023-06-09 ENCOUNTER — Ambulatory Visit (HOSPITAL_COMMUNITY)
Admission: RE | Admit: 2023-06-09 | Discharge: 2023-06-09 | Disposition: A | Discharge status: Home / Self Care | Source: Ambulatory Visit | Attending: Nurse Practitioner | Admitting: Nurse Practitioner

## 2023-06-09 DIAGNOSIS — I639 Cerebral infarction, unspecified: Secondary | ICD-10-CM | POA: Insufficient documentation

## 2023-06-09 DIAGNOSIS — R2243 Localized swelling, mass and lump, lower limb, bilateral: Secondary | ICD-10-CM | POA: Insufficient documentation

## 2023-06-09 DIAGNOSIS — I10 Essential (primary) hypertension: Secondary | ICD-10-CM | POA: Insufficient documentation

## 2023-06-09 DIAGNOSIS — Z79899 Other long term (current) drug therapy: Secondary | ICD-10-CM | POA: Insufficient documentation

## 2023-06-09 DIAGNOSIS — I25118 Atherosclerotic heart disease of native coronary artery with other forms of angina pectoris: Secondary | ICD-10-CM | POA: Insufficient documentation

## 2023-06-09 MED ORDER — NITROGLYCERIN 0.4 MG SL SUBL
SUBLINGUAL_TABLET | SUBLINGUAL | Status: AC
  Filled 2023-06-09: qty 2

## 2023-06-09 MED ORDER — NITROGLYCERIN 0.4 MG SL SUBL
0.8000 mg | SUBLINGUAL_TABLET | Freq: Once | SUBLINGUAL | Status: AC
  Administered 2023-06-09: 0.8 mg via SUBLINGUAL

## 2023-06-09 MED ORDER — IOHEXOL 350 MG/ML SOLN
100.0000 mL | Freq: Once | INTRAVENOUS | Status: AC | PRN
  Administered 2023-06-09: 100 mL via INTRAVENOUS

## 2023-06-09 NOTE — Progress Notes (Signed)
 Patient tells me she is wearing Nitrodur patch 0.4. Spoke to Dr Jens Som re this. Aware of patient BP and stated to go ahead with 2 nitroglycerine and leave her patch on.

## 2023-06-11 ENCOUNTER — Ambulatory Visit: Attending: Cardiology

## 2023-06-11 ENCOUNTER — Telehealth: Payer: Self-pay | Admitting: Nurse Practitioner

## 2023-06-11 VITALS — HR 68 | Ht 63.0 in | Wt 188.6 lb

## 2023-06-11 DIAGNOSIS — I200 Unstable angina: Secondary | ICD-10-CM | POA: Diagnosis not present

## 2023-06-11 DIAGNOSIS — I25118 Atherosclerotic heart disease of native coronary artery with other forms of angina pectoris: Secondary | ICD-10-CM

## 2023-06-11 NOTE — Telephone Encounter (Addendum)
     Cardiac Catheterization   You are scheduled for a Cardiac Catheterization on Tuesday, April 8 with Dr.  Truett Mainland .  1. Please arrive at the [facility] (Main Entrance A) at [facility]: [address] [place], [place] [address] at 5:30 AM (This time is 2 hour(s) before your procedure to ensure your preparation).   Free valet parking service is available. You will check in at ADMITTING. The support person will be asked to wait in the waiting room.  It is OK to have someone drop you off and come back when you are ready to be discharged.        Special note: Every effort is made to have your procedure done on time. Please understand that emergencies sometimes delay scheduled procedures.  2. Diet: Do not eat solid foods after midnight.  You may have clear liquids until 5 AM the day of the procedure.  3. Labs: You will need to have blood drawn on Thursday, April 4 at [facility] [address], [place]  Open: 7:30am - 5pm    Phone: [phone]. You do not need to be fasting.  4. Medication instructions in preparation for your procedure:   Contrast Allergy: No   Stop taking Eliquis (Apixiban) on Saturday, April 5. DO NOT TAKE ANY MORE AFTER SATURDAY EVENING DOSE  Stop taking, Lasix (Furosemide)  Tuesday, April 8, HOLD ON THE MORNING OF PROCEDURE   On the morning of your procedure, take Aspirin 81 mg and any morning medicines NOT listed above.  You may use sips of water.  5. Plan to go home the same day, you will only stay overnight if medically necessary. 6. You MUST have a responsible adult to drive you home. 7. An adult MUST be with you the first 24 hours after you arrive home. 8. Bring a current list of your medications, and the last time and date medication taken. 9. Bring ID and current insurance cards. 10.Please wear clothes that are easy to get on and off and wear slip-on shoes.  Thank you for allowing [place] to care for you!   --  [facility]

## 2023-06-11 NOTE — Progress Notes (Unsigned)
   Nurse Visit   Date of Encounter: 06/11/2023 ID: LANE KJOS, DOB Jan 27, 1941, MRN [id number]  PCP:  Geoffry Paradise, MD   [facility] Providers Cardiologist:  None { Click to update primary MD,subspecialty MD or APP then REFRESH:1}     Visit Details   VS:  Pulse 68   Ht 5\' 3"  (1.6 m)   Wt 188 lb 9.6 oz (85.5 kg)   BMI 33.41 kg/m  , BMI Body mass index is 33.41 kg/m.  Wt Readings from Last 3 Encounters:  06/11/23 188 lb 9.6 oz (85.5 kg)  05/25/23 187 lb 9.6 oz (85.1 kg)  04/15/23 185 lb (83.9 kg)     Reason for visit: EKG Performed today: Vitals, EKG, and Provider consulted:Dr. Anne Fu Changes (medications, testing, etc.) : None Length of Visit: 10 minutes    Medications Adjustments/Labs and Tests Ordered: Orders Placed This Encounter  Procedures   EKG 12-Lead   No orders of the defined types were placed in this encounter.    Signed, Dent Plantz Merilynn Finland, LPN  4/0/9811 9:14 PM

## 2023-06-11 NOTE — Telephone Encounter (Addendum)
 Patient called back and states her daughter is available to bring her to the office today.  Patient has been scheduled on the nurse visit schedule for today at 2 pm and advised to complete labs after EKG at the lab down stairs. Patient agreeable and voiced understanding.   Patient scheduled on nurse schedule for today at 2pm.    Copy of cath instructions sent via mychart.

## 2023-06-11 NOTE — Addendum Note (Signed)
 Addended by: Alveta Heimlich on: 06/11/2023 12:01 PM   Modules accepted: Orders

## 2023-06-11 NOTE — Telephone Encounter (Signed)
 Desiree Leon was contacted and CT results were reviewed in detail.  She had all questions answered to her satisfaction and was made aware that further evaluation is necessary per Dr. Jacinto Halim with a [facility].  Patient will come in for updated labs and EKG. we will also contact patient and schedule her left heart catheterization procedure at the next available time.  She was advised that if chest pain returns and is not relieved with as needed nitroglycerin to follow-up in the ED for further evaluation.  Please see consent below regarding upcoming left heart catheterization procedure.   This procedure has been fully reviewed with the patient and written informed consent has been obtained.  Informed Consent   Shared Decision Making/Informed Consent The risks [stroke (1 in 1000), death (1 in 1000), kidney failure [usually temporary] (1 in 500), bleeding (1 in 200), allergic reaction [possibly serious] (1 in 200)], benefits (diagnostic support and management of coronary artery disease) and alternatives of a cardiac catheterization were discussed in detail with Desiree Leon and she is willing to proceed.

## 2023-06-11 NOTE — Progress Notes (Signed)
 She needs repeat heart catheterization as she has soft BP and unable to up titrate meds. Also cath was long time ago

## 2023-06-11 NOTE — Telephone Encounter (Signed)
 Pt returning calling requesting to speak to "T". Please advise

## 2023-06-11 NOTE — Telephone Encounter (Signed)
 Spoke with the patient and gave her the cath instructions. She voiced understanding and states she is going to call her granddaughter to see if she can bring her up here today or tomorrow to complete labs and EKG.   Patient states she will call me back.

## 2023-06-11 NOTE — Addendum Note (Signed)
 Addended by: Elizabeth Palau on: 06/11/2023 04:36 PM   Modules accepted: Orders

## 2023-06-11 NOTE — Telephone Encounter (Signed)
 Pt dropped of bp readings.Will be In providers box by eod. Copy has been made and placed In blue abc folder.

## 2023-06-12 ENCOUNTER — Ambulatory Visit: Payer: Medicare PPO | Admitting: Physician Assistant

## 2023-06-12 ENCOUNTER — Ambulatory Visit (HOSPITAL_COMMUNITY)
Admission: RE | Admit: 2023-06-12 | Discharge: 2023-06-12 | Disposition: A | Discharge status: Home / Self Care | Payer: Medicare PPO | Source: Ambulatory Visit | Attending: Vascular Surgery | Admitting: Vascular Surgery

## 2023-06-12 ENCOUNTER — Encounter: Payer: Self-pay | Admitting: Physician Assistant

## 2023-06-12 VITALS — BP 105/62 | HR 61 | Temp 98.5°F | Ht 63.0 in | Wt 186.8 lb

## 2023-06-12 DIAGNOSIS — I872 Venous insufficiency (chronic) (peripheral): Secondary | ICD-10-CM | POA: Diagnosis not present

## 2023-06-12 LAB — BASIC METABOLIC PANEL WITH GFR
BUN/Creatinine Ratio: 14 (ref 12–28)
BUN: 12 mg/dL (ref 8–27)
CO2: 23 mmol/L (ref 20–29)
Calcium: 9 mg/dL (ref 8.7–10.3)
Chloride: 102 mmol/L (ref 96–106)
Creatinine, Ser: 0.86 mg/dL (ref 0.57–1.00)
Glucose: 135 mg/dL — ABNORMAL HIGH (ref 70–99)
Potassium: 4.2 mmol/L (ref 3.5–5.2)
Sodium: 140 mmol/L (ref 134–144)
eGFR: 67 mL/min/{1.73_m2} (ref >59)

## 2023-06-12 LAB — CBC
Hematocrit: 28.8 % — ABNORMAL LOW (ref 34.0–46.6)
Hemoglobin: 8.7 g/dL — ABNORMAL LOW (ref 11.1–15.9)
MCH: 24.6 pg — ABNORMAL LOW (ref 26.6–33.0)
MCHC: 30.2 g/dL — ABNORMAL LOW (ref 31.5–35.7)
MCV: 82 fL (ref 79–97)
Platelets: 322 10*3/uL (ref 150–450)
RBC: 3.53 x10E6/uL — ABNORMAL LOW (ref 3.77–5.28)
RDW: 17.3 % — ABNORMAL HIGH (ref 11.7–15.4)
WBC: 7.9 10*3/uL (ref 3.4–10.8)

## 2023-06-12 NOTE — Progress Notes (Signed)
 Requested by:  Geoffry Paradise, MD [address] [place],  [place] [address]  Reason for consultation: bilateral lower extremity swelling     History of Present Illness   Desiree Leon is a 83 y.o. ([birth year]) female who presents for evaluation of bilateral lower extremity swelling, pain, and skin changes.Patient is her with her daughter. She reports that her legs have been bothering her for over 1 year. She experiences swelling and edema in her legs. This is improved somewhat overnight but returns quickly after getting up. She also experiences aching, heaviness, itchy dry scaly skin, Redness, stinging and stabbing pains. She has tried topical treatments from her dermatologist. She says this initially helped but no longer does. She currently uses a topical vitamin e ointment and she feels this has been the most helpful. She has tried compression stockings in the past but cannot don and doff them by herself. She elevates currently in recliner, but this is not above level of her heart. She takes fluid pills but she expresses that this only helps intermittently and was told by Cardiologist to take them sparingly due to her low blood pressure. She does not regularly exercise but she does have a dog that she walks frequently.  She has history of severe CAD and CHF. 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   Venous symptoms include: aching, heavy,  itching, swelling, stinging, scaly skin, redness Onset/duration:  > 1 year  Occupation:  retired [employer] operational officer Aggravating factors: sitting, standing Alleviating factors: elevation Compression:  yes Helps:  unable to get on and off Pain medications:  yes Previous vein procedures:  no History of DVT:  yes, multiple DVT/PE on Eliquis  Past Medical History:  Diagnosis Date   Anxiety    Blood transfusion    CAP (community acquired pneumonia)    COPD (chronic obstructive pulmonary disease) (HCC)    Heart murmur    History of  colon polyps    Hyperlipidemia    Hypertension    MI (myocardial infarction) (HCC)    Mitral valve prolapse    NSTEMI (non-ST elevated myocardial infarction) (HCC)    Osteoarthritis    Osteoporosis    Pulmonary emboli (HCC) 04/12/2019   Spinal stenosis    Thyroid disease    hypothyroidism   Unstable angina (HCC) 05/08/2014   Venous insufficiency     Past Surgical History:  Procedure Laterality Date   ABDOMINAL HYSTERECTOMY  11/26/1976   APPENDECTOMY  02/14/1978   BASAL CELL CARCINOMA EXCISION  01/26/1990   COLONOSCOPY  1993, 2003, 2008, 2012   COLONOSCOPY WITH PROPOFOL N/A 04/26/2022   Procedure: COLONOSCOPY WITH PROPOFOL;  Surgeon: Kathi Der, MD;  Location: [facility] ENDOSCOPY;  Service: Gastroenterology;  Laterality: N/A;   DILATION AND CURETTAGE OF UTERUS  09/1976   ESOPHAGOGASTRODUODENOSCOPY (EGD) WITH PROPOFOL N/A 04/25/2022   Procedure: ESOPHAGOGASTRODUODENOSCOPY (EGD) WITH PROPOFOL;  Surgeon: Willis Modena, MD;  Location: [facility] ENDOSCOPY;  Service: Gastroenterology;  Laterality: N/A;   HEMOSTASIS CONTROL  04/26/2022   Procedure: HEMOSTASIS CONTROL;  Surgeon: Kathi Der, MD;  Location: [facility] ENDOSCOPY;  Service: Gastroenterology;;   HOT HEMOSTASIS N/A 04/26/2022   Procedure: HOT HEMOSTASIS (ARGON PLASMA COAGULATION/BICAP);  Surgeon: Kathi Der, MD;  Location: [facility] ENDOSCOPY;  Service: Gastroenterology;  Laterality: N/A;   LEFT HEART CATHETERIZATION WITH CORONARY ANGIOGRAM N/A 04/18/2014   Procedure: LEFT HEART CATHETERIZATION WITH CORONARY ANGIOGRAM;  Surgeon: Peter M Swaziland, MD;  Location: [facility] CATH LAB;  Service: Cardiovascular;  Laterality: N/A;   MASS  EXCISION  10/20/2011   Procedure: MINOR EXCISION OF MASS;  Surgeon: Ernestene Mention, MD;  Location: [facility];  Service: General;  Laterality: N/A;  Excision mass posterior neck   OOPHORECTOMY  02/14/1978   POLYPECTOMY  04/26/2022   Procedure: POLYPECTOMY;  Surgeon: Kathi Der, MD;  Location: [facility]  ENDOSCOPY;  Service: Gastroenterology;;   SHOULDER SURGERY  10/18/2007   basal cell   SQUAMOUS CELL CARCINOMA EXCISION     TONSILLECTOMY  1955   TUBAL LIGATION  07/08/74    Social History   Socioeconomic History   Marital status: Widowed    Spouse name: Not on file   Number of children: 3   Years of education: Not on file   Highest education level: Not on file  Occupational History   Not on file  Tobacco Use   Smoking status: Former    Current packs/day: 0.00    Average packs/day: 0.5 packs/day for 30.0 years (15.0 ttl pk-yrs)    Types: Cigarettes    Start date: 03/11/1975    Quit date: 03/10/2005    Years since quitting: 18.2   Smokeless tobacco: Never  Vaping Use   Vaping status: Never Used  Substance and Sexual Activity   Alcohol use: No   Drug use: No   Sexual activity: Not on file  Other Topics Concern   Not on file  Social History Narrative   Not on file   Social Drivers of Health   Financial Resource Strain: Not on file  Food Insecurity: No Food Insecurity (04/05/2023)   Hunger Vital Sign    Worried About Running Out of Food in the Last Year: Never true    Ran Out of Food in the Last Year: Never true  Transportation Needs: No Transportation Needs (04/05/2023)   PRAPARE - Administrator, Civil Service (Medical): No    Lack of Transportation (Non-Medical): No  Physical Activity: Not on file  Stress: Not on file  Social Connections: Socially Isolated (04/05/2023)   Social Connection and Isolation Panel [NHANES]    Frequency of Communication with Friends and Family: More than three times a week    Frequency of Social Gatherings with Friends and Family: More than three times a week    Attends Religious Services: Never    Database administrator or Organizations: No    Attends Banker Meetings: Never    Marital Status: Widowed  Intimate Partner Violence: Not At Risk (04/05/2023)   Humiliation, Afraid, Rape, and Kick questionnaire    Fear of  Current or Ex-Partner: No    Emotionally Abused: No    Physically Abused: No    Sexually Abused: No    Family History  Problem Relation Age of Onset   Heart disease Mother    Heart disease Father    Cancer Sister        breast   Cancer Daughter        cervical and bone   Heart disease Sister    Thyroid disease Sister     Current Outpatient Medications  Medication Sig Dispense Refill   acetaminophen (TYLENOL) 500 MG tablet Take 500 mg by mouth daily as needed for fever or headache.     ALPRAZolam (XANAX) 0.25 MG tablet Take 0.25 mg by mouth daily as needed for anxiety.     apixaban (ELIQUIS) 2.5 MG TABS tablet Take 1 tablet (2.5 mg total) by mouth 2 (two) times daily. 180 tablet 3   budesonide-formoterol (  SYMBICORT) 160-4.5 MCG/ACT inhaler Inhale 2 puffs into the lungs 2 (two) times daily. 1 each 12   cholecalciferol (VITAMIN D) 25 MCG (1000 UNIT) tablet Take 2,000 Units by mouth daily.     COLLAGEN PO Take 10 g by mouth daily.     escitalopram (LEXAPRO) 5 MG tablet Take 5 mg by mouth every evening.      furosemide (LASIX) 20 MG tablet Take 20 mg by mouth daily as needed for fluid or edema.     glucose blood test strip Accu-Chek SmartView Test Strips  CHECK BLOOD SUGAR TWICE DAILY.     levothyroxine (SYNTHROID) 125 MCG tablet Take 125 mcg by mouth daily.     metoprolol tartrate (LOPRESSOR) 25 MG tablet Take 0.5 tablets (12.5 mg total) by mouth daily. 30 tablet 0   multivitamin-iron-minerals-folic acid (CENTRUM) chewable tablet Chew 1 tablet by mouth daily.     niCARdipine (CARDENE) 20 MG capsule Take 20 mg by mouth at bedtime.     nitroGLYCERIN (NITRODUR - DOSED IN MG/24 HR) 0.4 mg/hr patch Place 0.4 mg onto the skin daily.     nitroGLYCERIN (NITROSTAT) 0.4 MG SL tablet Place 1 tablet (0.4 mg total) under the tongue every 5 (five) minutes as needed for chest pain. 30 tablet 0   olopatadine (PATADAY) 0.1 % ophthalmic solution Place 1 drop into both eyes daily. 24 hr     oxyCODONE  (OXY IR/ROXICODONE) 5 MG immediate release tablet Take 5 mg by mouth daily as needed for severe pain.     OXYGEN Inhale 2.5 L into the lungs continuous.     pantoprazole (PROTONIX) 40 MG tablet Take 1 tablet (40 mg total) by mouth daily at 12 noon. (Patient taking differently: Take 40 mg by mouth 2 (two) times daily.) 30 tablet 1   Probiotic Product (ALIGN) 4 MG CAPS Take 4 mg by mouth daily.     ranolazine (RANEXA) 500 MG 12 hr tablet TAKE ONE TABLET BY MOUTH TWICE DAILY 180 tablet 3   triamcinolone cream (KENALOG) 0.1 % Apply 1 Application topically daily. On both legs.With vitamin E oil     VYTORIN 10-20 MG per tablet Take 1 tablet by mouth daily with supper.     methocarbamol (ROBAXIN) 500 MG tablet Take 250-500 mg by mouth daily as needed for muscle spasms.     No current facility-administered medications for this visit.    Allergies  Allergen Reactions   Other Anaphylaxis and Cough    fragarances-perfumes   Dilaudid [Hydromorphone Hcl] Nausea Only   Erythromycin Other (See Comments)   Diflucan [Fluconazole] Palpitations and Hypertension    When taken with Nicardipine, reaction happens.    REVIEW OF SYSTEMS (negative unless checked):   Cardiac:  []  Chest pain or chest pressure? []  Shortness of breath upon activity? []  Shortness of breath when lying flat? []  Irregular heart rhythm?  Vascular:  []  Pain in calf, thigh, or hip brought on by walking? []  Pain in feet at night that wakes you up from your sleep? [x]  Blood clot in your veins? [x]  Leg swelling?  Pulmonary:  []  Oxygen at home? []  Productive cough? []  Wheezing?  Neurologic:  []  Sudden weakness in arms or legs? []  Sudden numbness in arms or legs? []  Sudden onset of difficult speaking or slurred speech? []  Temporary loss of vision in one eye? []  Problems with dizziness?  Gastrointestinal:  []  Blood in stool? []  Vomited blood?  Genitourinary:  []  Burning when urinating? []  Blood in urine?  Psychiatric:   []   Major depression  Hematologic:  []  Bleeding problems? []  Problems with blood clotting?  Dermatologic:  []  Rashes or ulcers?  Constitutional:  []  Fever or chills?  Ear/Nose/Throat:  []  Change in hearing? []  Nose bleeds? []  Sore throat?  Musculoskeletal:  []  Back pain? []  Joint pain? []  Muscle pain?   Physical Examination     Vitals:   06/12/23 1231  BP: 105/62  Pulse: 61  Temp: 98.5 F (36.9 C)  TempSrc: Temporal  SpO2: 97%  Weight: 186 lb 12.8 oz (84.7 kg)  Height: 5\' 3"  (1.6 m)   Body mass index is 33.09 kg/m.  General:  WDWN in NAD; vital signs documented above Gait: Normal HENT: WNL, normocephalic Pulmonary: normal non-labored breathing without wheezing Cardiac: regular HR Abdomen: soft Vascular Exam/Pulses: 2+ femoral, 2+ DP bilaterally, feet warm and well perfused  Extremities: without varicose veins, with reticular veins, with edema, with stasis pigmentation, without lipodermatosclerosis, without ulcers Musculoskeletal: no muscle wasting or atrophy  Neurologic: A&O X 3;  No focal weakness or paresthesias are detected Psychiatric:  The pt has Normal affect.  Non-invasive Vascular Imaging   BLE Venous Insufficiency Duplex (06/12/23):  RLE:  No DVT and SVT GSV reflux at [facility] GSV diameter 0.2-0.3 cm SSV reflux prox and mid calf CFV, FV deep venous reflux  LLE: No DVT and SVT GSV reflux Mid to knee GSV diameter 0.1-0-39 cm No SSV reflux  CFV deep venous reflux   Medical Decision Making   KENNETTA PAVLOVIC is a 83 y.o. female who presents with: BLE chronic venous insufficiency with edema, skin changes and pain. She has multiple risk factors for venous insuffiency including CHF, CAD, sedentary lifestyle, history of DVT, and being overweight. Her duplex today shows no DVT or PE. She does have deep reflux bilaterally. She also has bilateral GSV reflux. She has some SSV reflux on the RLE but no reflux on the left. Bilaterally her GSV are very small.  Based on today's duplex she would not be a candidate for a venous lazer ablation.  Based on the patient's history and examination, I recommend: daily elevation of 20-30 minutes above level of heart, daily compression stocking use, exercise, weight reduction, refraining from prolonged sitting or standing I discussed with the patient use of her 15-20 mm knee high compression stockings. She can attempt to use a donning and doffing device that her daughter has or her daughter is willing to assist Would continue to use moisturizers or ointment from Dermatologist to keep her skin integrity improved Patient was provided with information about vein health She can follow up as needed if any new or concerning symptoms  Desiree Congress, PA-C [facility] Office: [phone]  06/15/2023, 2:09 PM  Clinic MD: Hetty Blend

## 2023-06-15 ENCOUNTER — Telehealth: Payer: Self-pay | Admitting: *Deleted

## 2023-06-15 DIAGNOSIS — Z9981 Dependence on supplemental oxygen: Secondary | ICD-10-CM | POA: Diagnosis not present

## 2023-06-15 DIAGNOSIS — J449 Chronic obstructive pulmonary disease, unspecified: Secondary | ICD-10-CM | POA: Diagnosis not present

## 2023-06-15 DIAGNOSIS — I2609 Other pulmonary embolism with acute cor pulmonale: Secondary | ICD-10-CM | POA: Diagnosis not present

## 2023-06-15 NOTE — Telephone Encounter (Signed)
 Cardiac Catheterization scheduled at [facility] for: Tuesday June 16, 2023 7:30 AM Arrival time [facility] Main Entrance A at: 5:30 AM  Nothing to eat after midnight prior to procedure, clear liquids until 5 AM day of procedure.   Medication instructions: -Hold:  Eliquis-pt reports none 06/14/23 and knows to hold until post procedure  Lasix-AM of procedure -Other usual morning medications can be taken with sips of water including aspirin 81 mg.  Plan to go home the same day, you will only stay overnight if medically necessary.  You must have responsible adult to drive you home.  Someone must be with you the first 24 hours after you arrive home.  Reviewed procedure instructions with patient.

## 2023-06-16 ENCOUNTER — Encounter (HOSPITAL_COMMUNITY): Payer: Self-pay | Admitting: Cardiology

## 2023-06-16 ENCOUNTER — Ambulatory Visit (HOSPITAL_COMMUNITY)
Admission: RE | Disposition: A | Discharge status: Home / Self Care | Payer: Self-pay | Source: Home / Self Care | Attending: Cardiology

## 2023-06-16 ENCOUNTER — Telehealth: Payer: Self-pay | Admitting: Nurse Practitioner

## 2023-06-16 ENCOUNTER — Other Ambulatory Visit: Payer: Self-pay

## 2023-06-16 ENCOUNTER — Encounter: Payer: Self-pay | Admitting: Cardiology

## 2023-06-16 ENCOUNTER — Ambulatory Visit (HOSPITAL_COMMUNITY)
Admission: RE | Admit: 2023-06-16 | Discharge: 2023-06-16 | Disposition: A | Discharge status: Home / Self Care | Attending: Cardiology | Admitting: Cardiology

## 2023-06-16 DIAGNOSIS — Z7901 Long term (current) use of anticoagulants: Secondary | ICD-10-CM | POA: Insufficient documentation

## 2023-06-16 DIAGNOSIS — E785 Hyperlipidemia, unspecified: Secondary | ICD-10-CM | POA: Diagnosis not present

## 2023-06-16 DIAGNOSIS — I2582 Chronic total occlusion of coronary artery: Secondary | ICD-10-CM | POA: Diagnosis not present

## 2023-06-16 DIAGNOSIS — D649 Anemia, unspecified: Secondary | ICD-10-CM | POA: Diagnosis not present

## 2023-06-16 DIAGNOSIS — Z86711 Personal history of pulmonary embolism: Secondary | ICD-10-CM

## 2023-06-16 DIAGNOSIS — Z86718 Personal history of other venous thrombosis and embolism: Secondary | ICD-10-CM | POA: Diagnosis not present

## 2023-06-16 DIAGNOSIS — I2584 Coronary atherosclerosis due to calcified coronary lesion: Secondary | ICD-10-CM | POA: Insufficient documentation

## 2023-06-16 DIAGNOSIS — I25118 Atherosclerotic heart disease of native coronary artery with other forms of angina pectoris: Secondary | ICD-10-CM | POA: Insufficient documentation

## 2023-06-16 DIAGNOSIS — Z8673 Personal history of transient ischemic attack (TIA), and cerebral infarction without residual deficits: Secondary | ICD-10-CM | POA: Insufficient documentation

## 2023-06-16 DIAGNOSIS — I10 Essential (primary) hypertension: Secondary | ICD-10-CM | POA: Insufficient documentation

## 2023-06-16 DIAGNOSIS — I959 Hypotension, unspecified: Secondary | ICD-10-CM | POA: Diagnosis not present

## 2023-06-16 DIAGNOSIS — J449 Chronic obstructive pulmonary disease, unspecified: Secondary | ICD-10-CM | POA: Diagnosis not present

## 2023-06-16 DIAGNOSIS — R600 Localized edema: Secondary | ICD-10-CM | POA: Insufficient documentation

## 2023-06-16 DIAGNOSIS — Z79899 Other long term (current) drug therapy: Secondary | ICD-10-CM | POA: Diagnosis not present

## 2023-06-16 DIAGNOSIS — I252 Old myocardial infarction: Secondary | ICD-10-CM | POA: Diagnosis not present

## 2023-06-16 HISTORY — PX: CORONARY ANGIOGRAPHY: CATH118303

## 2023-06-16 LAB — CBC
HCT: 30.3 % — ABNORMAL LOW (ref 36.0–46.0)
Hemoglobin: 9.1 g/dL — ABNORMAL LOW (ref 12.0–15.0)
MCH: 24 pg — ABNORMAL LOW (ref 26.0–34.0)
MCHC: 30 g/dL (ref 30.0–36.0)
MCV: 79.9 fL — ABNORMAL LOW (ref 80.0–100.0)
Platelets: 347 10*3/uL (ref 150–400)
RBC: 3.79 MIL/uL — ABNORMAL LOW (ref 3.87–5.11)
RDW: 18.7 % — ABNORMAL HIGH (ref 11.5–15.5)
WBC: 9.5 10*3/uL (ref 4.0–10.5)
nRBC: 0 % (ref 0.0–0.2)

## 2023-06-16 LAB — GLUCOSE, CAPILLARY
Glucose-Capillary: 125 mg/dL — ABNORMAL HIGH (ref 70–99)
Glucose-Capillary: 124 mg/dL — ABNORMAL HIGH (ref 70–99)

## 2023-06-16 SURGERY — CORONARY ANGIOGRAPHY (CATH LAB)
Anesthesia: LOCAL

## 2023-06-16 MED ORDER — ASPIRIN 81 MG PO CHEW
81.0000 mg | CHEWABLE_TABLET | ORAL | Status: DC
Start: 2023-06-16 — End: 2023-06-16

## 2023-06-16 MED ORDER — HEPARIN SODIUM (PORCINE) 1000 UNIT/ML IJ SOLN
INTRAMUSCULAR | Status: DC | PRN
  Administered 2023-06-16 (×2): 2000 [IU] via INTRAVENOUS

## 2023-06-16 MED ORDER — HEPARIN (PORCINE) IN NACL 1000-0.9 UT/500ML-% IV SOLN
INTRAVENOUS | Status: DC | PRN
  Administered 2023-06-16 (×2): 500 mL via INTRA_ARTERIAL

## 2023-06-16 MED ORDER — VERAPAMIL HCL 2.5 MG/ML IV SOLN
INTRAVENOUS | Status: DC | PRN
  Administered 2023-06-16: 10 mL via INTRA_ARTERIAL

## 2023-06-16 MED ORDER — HEPARIN SODIUM (PORCINE) 1000 UNIT/ML IJ SOLN
INTRAMUSCULAR | Status: AC
  Filled 2023-06-16: qty 10

## 2023-06-16 MED ORDER — VERAPAMIL HCL 2.5 MG/ML IV SOLN
INTRAVENOUS | Status: AC
  Filled 2023-06-16: qty 2

## 2023-06-16 MED ORDER — FENTANYL CITRATE (PF) 100 MCG/2ML IJ SOLN
INTRAMUSCULAR | Status: AC
  Filled 2023-06-16: qty 2

## 2023-06-16 MED ORDER — APIXABAN 2.5 MG PO TABS: 2.5000 mg | ORAL_TABLET | Freq: Two times a day (BID) | ORAL | 3 refills | Status: DC

## 2023-06-16 MED ORDER — SODIUM CHLORIDE 0.9 % IV SOLN: 250.0000 mL | INTRAVENOUS | Status: DC | PRN

## 2023-06-16 MED ORDER — ACETAMINOPHEN 500 MG PO TABS
1000.0000 mg | ORAL_TABLET | Freq: Once | ORAL | Status: AC
  Administered 2023-06-16: 1000 mg via ORAL

## 2023-06-16 MED ORDER — SODIUM CHLORIDE 0.9% FLUSH: 3.0000 mL | Freq: Two times a day (BID) | INTRAVENOUS | Status: DC

## 2023-06-16 MED ORDER — SODIUM CHLORIDE 0.9 % WEIGHT BASED INFUSION: 1.0000 mL/kg/h | INTRAVENOUS | Status: DC

## 2023-06-16 MED ORDER — MIDAZOLAM HCL 2 MG/2ML IJ SOLN
INTRAMUSCULAR | Status: DC | PRN
  Administered 2023-06-16: 1 mg via INTRAVENOUS

## 2023-06-16 MED ORDER — LIDOCAINE HCL (PF) 1 % IJ SOLN
INTRAMUSCULAR | Status: AC
  Filled 2023-06-16: qty 30

## 2023-06-16 MED ORDER — LABETALOL HCL 5 MG/ML IV SOLN: 10.0000 mg | INTRAVENOUS | Status: DC | PRN

## 2023-06-16 MED ORDER — SODIUM CHLORIDE 0.9 % IV SOLN: INTRAVENOUS | Status: DC

## 2023-06-16 MED ORDER — HYDRALAZINE HCL 20 MG/ML IJ SOLN: 10.0000 mg | INTRAMUSCULAR | Status: DC | PRN

## 2023-06-16 MED ORDER — ACETAMINOPHEN 325 MG PO TABS: 650.0000 mg | ORAL_TABLET | ORAL | Status: DC | PRN

## 2023-06-16 MED ORDER — ACETAMINOPHEN 500 MG PO TABS
ORAL_TABLET | ORAL | Status: AC
  Filled 2023-06-16: qty 2

## 2023-06-16 MED ORDER — FENTANYL CITRATE (PF) 100 MCG/2ML IJ SOLN
INTRAMUSCULAR | Status: DC | PRN
  Administered 2023-06-16: 25 ug via INTRAVENOUS

## 2023-06-16 MED ORDER — IOHEXOL 350 MG/ML SOLN
INTRAVENOUS | Status: DC | PRN
  Administered 2023-06-16: 40 mL via INTRA_ARTERIAL

## 2023-06-16 MED ORDER — SODIUM CHLORIDE 0.9 % WEIGHT BASED INFUSION: 3.0000 mL/kg/h | INTRAVENOUS | Status: AC

## 2023-06-16 MED ORDER — LIDOCAINE HCL (PF) 1 % IJ SOLN
INTRAMUSCULAR | Status: DC | PRN
Start: 2023-06-16 — End: 2023-06-16
  Administered 2023-06-16: 2 mL

## 2023-06-16 MED ORDER — MIDAZOLAM HCL 2 MG/2ML IJ SOLN
INTRAMUSCULAR | Status: AC
  Filled 2023-06-16: qty 2

## 2023-06-16 MED ORDER — SODIUM CHLORIDE 0.9% FLUSH: 3.0000 mL | INTRAVENOUS | Status: DC | PRN

## 2023-06-16 MED ORDER — ASPIRIN 81 MG PO CHEW: 81.0000 mg | CHEWABLE_TABLET | ORAL | Status: DC

## 2023-06-16 MED ORDER — ONDANSETRON HCL 4 MG/2ML IJ SOLN: 4.0000 mg | Freq: Four times a day (QID) | INTRAMUSCULAR | Status: DC | PRN

## 2023-06-16 SURGICAL SUPPLY — 12 items
CATH INFINITI AMBI 5FR TG (CATHETERS) IMPLANT
CATH INFINITI JR4 5F (CATHETERS) IMPLANT
DEVICE RAD COMP TR BAND LRG (VASCULAR PRODUCTS) IMPLANT
ELECT DEFIB PAD ADLT CADENCE (PAD) IMPLANT
GLIDESHEATH SLEND A-KIT 6F 22G (SHEATH) IMPLANT
GUIDEWIRE INQWIRE 1.5J.035X260 (WIRE) IMPLANT
INQWIRE 1.5J .035X260CM (WIRE) ×1 IMPLANT
KIT SINGLE USE MANIFOLD (KITS) IMPLANT
KIT SYRINGE INJ CVI SPIKEX1 (MISCELLANEOUS) IMPLANT
PACK CARDIAC CATHETERIZATION (CUSTOM PROCEDURE TRAY) ×1 IMPLANT
SET ATX-X65L (MISCELLANEOUS) IMPLANT
WIRE HI TORQ VERSACORE-J 145CM (WIRE) IMPLANT

## 2023-06-16 NOTE — Discharge Instructions (Signed)
 Radial Site Care The following information offers guidance on how to care for yourself after your procedure. Your health care provider may also give you more specific instructions. If you have problems or questions, contact your health care provider. What can I expect after the procedure? After the procedure, it is common to have bruising and tenderness in the incision area. Follow these instructions at home: Incision site care  Follow instructions from your health care provider about how to take care of your incision site. Make sure you: Wash your hands with soap and water for at least 20 seconds before and after you change your bandage (dressing). If soap and water are not available, use hand sanitizer. Remove your dressing in 24 hours. Leave stitches (sutures), skin glue, or adhesive strips in place. These skin closures may need to stay in place for 2 weeks or longer. If adhesive strip edges start to loosen and curl up, you may trim the loose edges. Do not remove adhesive strips completely unless your health care provider tells you to do that. Do not take baths, swim, or use a hot tub for at least 1 week. You may shower 24 hours after the procedure or as told by your health care provider. Remove the dressing and gently wash the incision area with plain soap and water. Pat the area dry with a clean towel. Do not rub the site. That could cause bleeding. Do not apply powder or lotion to the site. Check your incision site every day for signs of infection. Check for: Redness, swelling, or pain. Fluid or blood. Warmth. Pus or a bad smell. Activity For 24 hours after the procedure, or as directed by your health care provider: Do not flex or bend the affected arm. Do not push or pull heavy objects with the affected arm. Do not operate machinery or power tools. Do not drive. You should not drive yourself home from the hospital or clinic if you go home during that time period. You may drive 24  hours after the procedure unless your health care provider tells you not to. Do not lift anything that is heavier than 10 lb (4.5 kg), or the limit that you are told, until your health care provider says that it is safe. Return to your normal activities as told by your health care provider. Ask your health care provider what activities are safe for you and when you can return to work. If you were given a sedative during the procedure, it can affect you for several hours. Do not drive or operate machinery until your health care provider says that it is safe. General instructions Take over-the-counter and prescription medicines only as told by your health care provider. If you will be going home right after the procedure, plan to have a responsible adult care for you for the time you are told. This is important. Keep all follow-up visits. This is important. Contact a health care provider if: You have a fever or chills. You have any of these signs of infection at your incision site: Redness, swelling, or pain. Fluid or blood. Warmth. Pus or a bad smell. Get help right away if: The incision area swells very fast. The incision area is bleeding, and the bleeding does not stop when you hold steady pressure on the area. Your arm or hand becomes pale, cool, tingly, or numb. These symptoms may represent a serious problem that is an emergency. Do not wait to see if the symptoms will go away. Get medical  help right away. Call your local emergency services (911 in the [place] Do not drive yourself to the hospital. Summary After the procedure, it is common to have bruising and tenderness at the incision site. Follow instructions from your health care provider about how to take care of your radial site incision. Check the incision every day for signs of infection. Do not lift anything that is heavier than 10 lb (4.5 kg), or the limit that you are told, until your health care provider says that it is  safe. Get help right away if the incision area swells very fast, you have bleeding at the incision site that will not stop, or your arm or hand becomes pale, cool, or numb. This information is not intended to replace advice given to you by your health care provider. Make sure you discuss any questions you have with your health care provider. Document Revised: 04/15/2020 Document Reviewed: 04/15/2020 Elsevier Patient Education  2024 [employer].

## 2023-06-16 NOTE — Telephone Encounter (Signed)
 Pease let Desiree Leon know that her BP number are stabilizing. Please advise patient to continue to monitor with no medication changes needed at this time.  Robin Searing, NP

## 2023-06-16 NOTE — Telephone Encounter (Signed)
 Left patient a detailed message, ok per DPR, with results. Advised to call back with any questions or concerns.

## 2023-06-16 NOTE — H&P (Signed)
 OV 05/25/2023 copied for documentation   Cardiology Office Note    Patient Name: Desiree Leon Date of Encounter: 06/16/2023  Primary Care Provider:  Geoffry Paradise, MD Primary Cardiologist:  None Primary Electrophysiologist: None   Past Medical History    Past Medical History:  Diagnosis Date   Anxiety    Blood transfusion    CAP (community acquired pneumonia)    COPD (chronic obstructive pulmonary disease) (HCC)    Heart murmur    History of colon polyps    Hyperlipidemia    Hypertension    MI (myocardial infarction) (HCC)    Mitral valve prolapse    NSTEMI (non-ST elevated myocardial infarction) (HCC)    Osteoarthritis    Osteoporosis    Pulmonary emboli (HCC) 04/12/2019   Spinal stenosis    Thyroid disease    hypothyroidism   Unstable angina (HCC) 05/08/2014   Venous insufficiency     History of Present Illness  Desiree Leon is a 83 y.o. female with a PMH of CAD s/p NSTEMI 04/2014 with balloon angioplasty of an D2 with 80% residual stenosis, HTN, HLD, MVP, bilateral PE 04/2019, history of DVT (on Eliquis) history of GI bleed due to AVM's, anxiety, COPD, CVA presents today for post hospital follow-up and evaluation of 30-day event monitor.  Desiree Leon has been followed by Dr. Jacinto Halim since 2016 for management of CAD and NSTEMI that occurred in 2016 with balloon angioplasty of D2 and 80% residual stenosis unable to be further intervention due to small artery size.  She was admitted in 2021 with bilateral PE in the setting of pneumonia was placed on antibiotics and discharged on Eliquis which was discontinued after 1 year.  Pleated 2D echo that showed EF of 50-65% with grade 2 DD and moderate MR with mild to moderate TR and moderate pulmonary HTN.  She was seen by Dr. Jacinto Halim on 05/23/2021 with complaint of chest pain and was found to have large bilateral PE with RV strain.  She was started on Eliquis for life due to unprovoked PE.  She was admitted on 04/24/2022 with shortness of  breath and found to have hemoglobin of 6.1 with positive FOBT.  Dilated by GI with Eliquis placed on hold and colonoscopy completed showing large AVM that was treated with APC and PuraStat.  She was admitted on 04/04/2023 with complaint of dizziness and nausea with right-sided headache.  She also noted worsening edema in the legs with right greater than left.  She underwent an MRI of the brain that demonstrated an acute/subacute 18 mm infarct of the left superior cerebellum.  She had a CTA of the head and neck that demonstrated no abnormalities.  She had a 2D echo completed that showed EF of 60-65% and was discharged with 30-day event monitor.  She was seen by Jari Favre on 04/15/2023 for posthospital follow-up.  She was found to have hypotension and had Cardene decreased to 10 mg and denied any episodes of chest pain.  She also noted lower extremity pain and edema with Lasix not increased due to low BP.  30-day event monitor results showed predominantly sinus rhythm with no evidence of AF or malignant arrhythmias detected.  Desiree Leon presents today with her daughter for 1 month follow-up.  During today's visit she reports no episodes of palpitations or chest discomfort.  She has been compliant with her current medications and notes no adverse reactions.  Her blood pressure today was stable at 120/60 and heart rate 64 bpm.  During today's visit we reviewed the results of of her 30-day event monitor and patient had all questions answered to her satisfaction.  During today's visit she also reported experiencing some chest pressure and shortness of breath while picking weeds without wearing her nasal cannula O2.  She reports resolution of symptoms after wearing oxygen and resting.  We discussed the testing alternatives which included nuclear stress test however patient declined at this time.  She was in favor of pursuing a coronary CTA.  Patient denies palpitations, dyspnea, PND, orthopnea, nausea, vomiting,  dizziness, syncope, edema, weight gain, or early satiety.  Review of Systems  Please see the history of present illness.    All other systems reviewed and are otherwise negative except as noted above.  Physical Exam    Wt Readings from Last 3 Encounters:  06/16/23 84.4 kg  06/12/23 84.7 kg  06/11/23 85.5 kg   VS: Vitals:   06/16/23 0605 06/16/23 0732  BP: (!) 160/71   Pulse: 60   Resp: 18   Temp: 98.4 F (36.9 C)   SpO2: 99% 98%  ,Body mass index is 32.95 kg/m. GEN: Well nourished, well developed in no acute distress Neck: No JVD; No carotid bruits Pulmonary: Clear to auscultation without rales, wheezing or rhonchi  Cardiovascular: Normal rate. Regular rhythm. Normal S1. Normal S2.   Murmurs: There is no murmur.  ABDOMEN: Soft, non-tender, non-distended EXTREMITIES:  No edema; No deformity   EKG/LABS/ Recent Cardiac Studies   ECG personally reviewed by me today -none completed today  Risk Assessment/Calculations:       Lab Results  Component Value Date   WBC 9.5 06/16/2023   HGB 9.1 (L) 06/16/2023   HCT 30.3 (L) 06/16/2023   MCV 79.9 (L) 06/16/2023   PLT 347 06/16/2023   Lab Results  Component Value Date   CREATININE 0.86 06/11/2023   BUN 12 06/11/2023   NA 140 06/11/2023   K 4.2 06/11/2023   CL 102 06/11/2023   CO2 23 06/11/2023   Lab Results  Component Value Date   CHOL 141 04/05/2023   HDL 43 04/05/2023   LDLCALC 77 04/05/2023   TRIG 107 04/05/2023   CHOLHDL 3.3 04/05/2023    Lab Results  Component Value Date   HGBA1C 6.4 (H) 04/04/2023   Assessment & Plan    1.  History of CVA: -s/p acute/subacute 18 mm infarct of the left superior cerebellum -30-day event monitor worn to rule out AF as a cause that showed no evidence of AF or malignant rhythms -Continue metoprolol 12.5 mg twice daily -Eliquis 2.5 mg twice daily -Patient scheduled to follow-up with cardiology next month.  2.  History of CAD: -Patient has a history of NSTEMI with balloon  angioplasty and 80% residual stenosis due to small vessel size. -She reports experiencing shortness of breath and chest pressure while bending over and pulling weeds at home. -We will complete coronary CTA due to patient's intolerance to Myoview. -Patient was advised to go to the ED if chest pain returns and is not relieved with as needed Nitrostat  3.  Primary hypertension: -Patient's blood pressure today was stable at 120/60 -Continue Cardene 20 mg daily -Continue metoprolol 12.5 mg daily  4.  History of DVT: -History of unprovoked DVT with right heart strain -Continue Eliquis 2.5 mg twice daily  5.  Lower extremity edema: -Patient has +2 lower extremity edema bilateral -Continue Lasix 20 mg daily  6.  Hypotension: -Patient reports bouts of lightheadedness and dizziness that  occur in the midday. -We will decrease metoprolol to 12.5 mg daily -Continue Cardene 20 mg daily  Disposition: Follow-up with Dr. Jacinto Halim as scheduled   Signed, Elder Negus, NP 06/16/2023, 7:42 AM [facility]

## 2023-06-16 NOTE — Interval H&P Note (Signed)
 History and Physical Interval Note:  06/16/2023 7:43 AM  Desiree Leon  has presented today for surgery, with the diagnosis of history of cad - angina - shortness of breath.  The various methods of treatment have been discussed with the patient and family. After consideration of risks, benefits and other options for treatment, the patient has consented to  Procedure(s): LEFT HEART CATH AND CORONARY ANGIOGRAPHY (N/A) as a surgical intervention.  The patient's history has been reviewed, patient examined, no change in status, stable for surgery.  I have reviewed the patient's chart and labs.  Questions were answered to the patient's satisfaction.     Graydon Fofana J Merlinda Wrubel

## 2023-06-16 NOTE — Telephone Encounter (Signed)
 See other phone note with Alden Server recommendations.

## 2023-06-17 MED FILL — Heparin Sodium (Porcine) Inj 1000 Unit/ML: INTRAMUSCULAR | Qty: 10 | Status: AC

## 2023-06-24 ENCOUNTER — Ambulatory Visit: Payer: Medicare PPO | Admitting: Neurology

## 2023-06-24 VITALS — BP 136/66 | HR 71 | Ht 63.0 in | Wt 186.6 lb

## 2023-06-24 DIAGNOSIS — R251 Tremor, unspecified: Secondary | ICD-10-CM

## 2023-06-24 DIAGNOSIS — I639 Cerebral infarction, unspecified: Secondary | ICD-10-CM

## 2023-06-24 NOTE — Progress Notes (Signed)
 [facility] [address] [place]. [place] [address] [phone]       OFFICE FOLLOW-UP NOTE  Ms. Mee Hives Date of Birth:  07-29-1940 Medical Record Number:  [id number]   HPI: Desiree Leon is a 83 year old pleasant Caucasian lady seen today for initial office follow-up visit following hospital consultation for stroke in January 2025.  History is obtained from the patient and review of electronic medical records.  I personally reviewed pertinent available imaging films in PACS.  has a past medical history of Anxiety, Blood transfusion, CAP (community acquired pneumonia), COPD (chronic obstructive pulmonary disease) (HCC), Heart murmur, History of colon polyps, Hyperlipidemia, Hypertension, MI (myocardial infarction) (HCC), Mitral valve prolapse, NSTEMI (non-ST elevated myocardial infarction) (HCC), Osteoarthritis, Osteoporosis, Pulmonary emboli (HCC) (04/12/2019), Spinal stenosis, Thyroid disease, Unstable angina (HCC) (05/08/2014), and Venous insufficiency. who presented on 04/04/2023 with  dizziness, headache, twitching, left leg and left arm weakness.  She stated that her symptoms initially started on Monday with her feeling like she was stumbling, dizziness, room spinning.  She did not have any falls or hit her head.  She did miss a day of her Eliquis at the beginning of the month because the pharmacy ran out and she was unable to get it.  She has not missed any medication since then .  She had had a history of lower GI bleed requiring more than 4 units of blood and 24 hours ICU stay on 04/23/2022 and found to have multiple large AVMs which were treated with APC and Uristat.  On office follow-up with cardiologist on 05/19/2022 decided to reduce the dose of Eliquis from 5 mg twice daily to 2.5 mg twice daily and she was also requiring iron infusions to maintain her blood counts.  Her NIH stroke scale was 1 on admission.  CT head showed no acute abnormality and CT angiogram showed no  large vessel stenosis or occlusion.  MRI scan showed a acute to subacute small left cerebellar infarct.  LDL cholesterol was 77 mg percent.  Hemoglobin A1c 6.4.  Echocardiogram showed ejection fraction of 60 to 75%.  There was mild left atrial dilatation.  She subsequently underwent 30-day heart monitor which was negative for paroxysmal A-fib.  She was continued on low-dose Eliquis 2.5 twice daily.  She states she is done well since then.  She still has a little bit of tremor in the left upper extremity but it is improving.  She does have mild posterior headaches which are not disabling but are improving.  She is still on home oxygen. ROS:   14 system review of systems is positive for shortness of breath, tremor and memory difficulties, gait difficulty all other systems negative  PMH:  Past Medical History:  Diagnosis Date   Anxiety    Blood transfusion    CAP (community acquired pneumonia)    COPD (chronic obstructive pulmonary disease) (HCC)    Heart murmur    History of colon polyps    Hyperlipidemia    Hypertension    MI (myocardial infarction) (HCC)    Mitral valve prolapse    NSTEMI (non-ST elevated myocardial infarction) (HCC)    Osteoarthritis    Osteoporosis    Pulmonary emboli (HCC) 04/12/2019   Spinal stenosis    Thyroid disease    hypothyroidism   Unstable angina (HCC) 05/08/2014   Venous insufficiency     Social History:  Social History   Socioeconomic History   Marital status: Widowed    Spouse name: Not on file  Number of children: 3   Years of education: Not on file   Highest education level: Not on file  Occupational History   Not on file  Tobacco Use   Smoking status: Former    Current packs/day: 0.00    Average packs/day: 0.5 packs/day for 30.0 years (15.0 ttl pk-yrs)    Types: Cigarettes    Start date: 03/11/1975    Quit date: 03/10/2005    Years since quitting: 18.3   Smokeless tobacco: Never  Vaping Use   Vaping status: Never Used  Substance and  Sexual Activity   Alcohol use: No   Drug use: No   Sexual activity: Not on file  Other Topics Concern   Not on file  Social History Narrative   Not on file   Social Drivers of Health   Financial Resource Strain: Not on file  Food Insecurity: No Food Insecurity (04/05/2023)   Hunger Vital Sign    Worried About Running Out of Food in the Last Year: Never true    Ran Out of Food in the Last Year: Never true  Transportation Needs: No Transportation Needs (04/05/2023)   PRAPARE - Administrator, Civil Service (Medical): No    Lack of Transportation (Non-Medical): No  Physical Activity: Not on file  Stress: Not on file  Social Connections: Socially Isolated (04/05/2023)   Social Connection and Isolation Panel [NHANES]    Frequency of Communication with Friends and Family: More than three times a week    Frequency of Social Gatherings with Friends and Family: More than three times a week    Attends Religious Services: Never    Database administrator or Organizations: No    Attends Banker Meetings: Never    Marital Status: Widowed  Intimate Partner Violence: Not At Risk (04/05/2023)   Humiliation, Afraid, Rape, and Kick questionnaire    Fear of Current or Ex-Partner: No    Emotionally Abused: No    Physically Abused: No    Sexually Abused: No    Medications:   Current Outpatient Medications on File Prior to Visit  Medication Sig Dispense Refill   acetaminophen (TYLENOL) 500 MG tablet Take 500 mg by mouth daily as needed for fever or headache.     ALPRAZolam (XANAX) 0.25 MG tablet Take 0.25 mg by mouth daily as needed for anxiety.     apixaban (ELIQUIS) 2.5 MG TABS tablet Take 1 tablet (2.5 mg total) by mouth 2 (two) times daily. Resume on 4/8 evening 180 tablet 3   budesonide-formoterol (SYMBICORT) 160-4.5 MCG/ACT inhaler Inhale 2 puffs into the lungs 2 (two) times daily. 1 each 12   cholecalciferol (VITAMIN D) 25 MCG (1000 UNIT) tablet Take 2,000 Units by  mouth daily.     COLLAGEN PO Take 10 g by mouth daily.     escitalopram (LEXAPRO) 5 MG tablet Take 5 mg by mouth every evening.      furosemide (LASIX) 20 MG tablet Take 20 mg by mouth daily as needed for fluid or edema.     glucose blood test strip Accu-Chek SmartView Test Strips  CHECK BLOOD SUGAR TWICE DAILY.     levothyroxine (SYNTHROID) 125 MCG tablet Take 125 mcg by mouth daily.     methocarbamol (ROBAXIN) 500 MG tablet Take 250-500 mg by mouth daily as needed for muscle spasms.     metoprolol tartrate (LOPRESSOR) 25 MG tablet Take 0.5 tablets (12.5 mg total) by mouth daily. 30 tablet 0   multivitamin-iron-minerals-folic  acid (CENTRUM) chewable tablet Chew 1 tablet by mouth daily.     niCARdipine (CARDENE) 20 MG capsule Take 20 mg by mouth at bedtime.     nitroGLYCERIN (NITRODUR - DOSED IN MG/24 HR) 0.4 mg/hr patch Place 0.4 mg onto the skin daily.     nitroGLYCERIN (NITROSTAT) 0.4 MG SL tablet Place 1 tablet (0.4 mg total) under the tongue every 5 (five) minutes as needed for chest pain. 30 tablet 0   olopatadine (PATADAY) 0.1 % ophthalmic solution Place 1 drop into both eyes daily. 24 hr     oxyCODONE (OXY IR/ROXICODONE) 5 MG immediate release tablet Take 5 mg by mouth daily as needed for severe pain.     OXYGEN Inhale 2.5 L into the lungs continuous.     pantoprazole (PROTONIX) 40 MG tablet Take 1 tablet (40 mg total) by mouth daily at 12 noon. (Patient taking differently: Take 40 mg by mouth 2 (two) times daily.) 30 tablet 1   Probiotic Product (ALIGN) 4 MG CAPS Take 4 mg by mouth daily.     ranolazine (RANEXA) 500 MG 12 hr tablet TAKE ONE TABLET BY MOUTH TWICE DAILY 180 tablet 3   triamcinolone cream (KENALOG) 0.1 % Apply 1 Application topically daily. On both legs.With vitamin E oil     VYTORIN 10-20 MG per tablet Take 1 tablet by mouth daily with supper.     [DISCONTINUED] fluticasone (FLONASE) 50 MCG/ACT nasal spray Place 2 sprays into both nostrils daily.     No current  facility-administered medications on file prior to visit.    Allergies:   Allergies  Allergen Reactions   Other Anaphylaxis and Cough    fragarances-perfumes   Dilaudid [Hydromorphone Hcl] Nausea Only   Erythromycin Other (See Comments)   Diflucan [Fluconazole] Palpitations and Hypertension    When taken with Nicardipine, reaction happens.    Physical Exam General: well developed, well nourished, seated, in no evident distress.  She is on home oxygen Head: head normocephalic and atraumatic.  Neck: supple with no carotid or supraclavicular bruits Cardiovascular: regular rate and rhythm, no murmurs Musculoskeletal: no deformity Skin:  no rash/petichiae Vascular:  Normal pulses all extremities Vitals:   06/24/23 1042  BP: 136/66  Pulse: 71   Neurologic Exam Mental Status: Awake and fully alert. Oriented to place and time. Recent and remote memory intact. Attention span, concentration and fund of knowledge appropriate. Mood and affect appropriate.  Cranial Nerves: Fundoscopic exam reveals sharp disc margins. Pupils equal, briskly reactive to light. Extraocular movements full without nystagmus. Visual fields full to confrontation. Hearing intact. Facial sensation intact. Face, tongue, palate moves normally and symmetrically.  Motor: Normal bulk and tone. Normal strength in all tested extremity muscles.  Mild resting head and action left upper extremity tremor. Sensory.: intact to touch ,pinprick .position and vibratory sensation.  Coordination: Rapid alternating movements normal in all extremities. Finger-to-nose and heel-to-shin performed accurately bilaterally. Gait and Station: Arises from chair without difficulty. Stance is normal. Gait demonstrates normal stride length and balance .  Tandem walking not attempted Reflexes: 1+ and symmetric. Toes downgoing.   NIHSS  0 Modified Rankin  1   ASSESSMENT: 83 year old Caucasian lady with left cerebellar infarct in January 2025  cryptogenic etiology.  She is doing well but has some poststroke left upper extremity tremor.    PLAN: I had a long d/w patient and her daughter about her recent stroke,post stroke tremor, risk for recurrent stroke/TIAs, personally independently reviewed imaging studies and stroke evaluation results and  answered questions.Continue Eliquis 2.5 mg twice daily for her pulmonary embolism and   for secondary stroke prevention and maintain strict control of hypertension with blood pressure goal below 130/90, diabetes with hemoglobin A1c goal below 6.5% and lipids with LDL cholesterol goal below 70 mg/dL. I also advised the patient to eat a healthy diet with plenty of whole grains, cereals, fruits and vegetables, exercise regularly and maintain ideal body weight Followup in the future with my nurse practitioner in 6 months or call earlier if needed.   Greater than 50% of time during this 35 minute visit was spent on counseling,explanation of diagnosis, planning of further management, discussion with patient and family and coordination of care Ardella Beaver, MD Note: This document was prepared with digital dictation and possible smart phrase technology. Any transcriptional errors that result from this process are unintentional

## 2023-06-24 NOTE — Patient Instructions (Signed)
 I had a long d/w patient and her daughter about her recent stroke,post stroke tremor, risk for recurrent stroke/TIAs, personally independently reviewed imaging studies and stroke evaluation results and answered questions.Continue Eliquis 2.5 mg twice daily for her pulmonary embolism and   for secondary stroke prevention and maintain strict control of hypertension with blood pressure goal below 130/90, diabetes with hemoglobin A1c goal below 6.5% and lipids with LDL cholesterol goal below 70 mg/dL. I also advised the patient to eat a healthy diet with plenty of whole grains, cereals, fruits and vegetables, exercise regularly and maintain ideal body weight Followup in the future with my nurse practitioner in 6 months or call earlier if needed.  Stroke Prevention Some medical conditions and behaviors can lead to a higher chance of having a stroke. You can help prevent a stroke by eating healthy, exercising, not smoking, and managing any medical conditions you have. Stroke is a leading cause of functional impairment. Primary prevention is particularly important because a majority of strokes are first-time events. Stroke changes the lives of not only those who experience a stroke but also their family and other caregivers. How can this condition affect me? A stroke is a medical emergency and should be treated right away. A stroke can lead to brain damage and can sometimes be life-threatening. If a person gets medical treatment right away, there is a better chance of surviving and recovering from a stroke. What can increase my risk? The following medical conditions may increase your risk of a stroke: Cardiovascular disease. High blood pressure (hypertension). Diabetes. High cholesterol. Sickle cell disease. Blood clotting disorders (hypercoagulable state). Obesity. Sleep disorders (obstructive sleep apnea). Other risk factors include: Being older than age 25. Having a history of blood clots, stroke, or  mini-stroke (transient ischemic attack, TIA). Genetic factors, such as race, ethnicity, or a family history of stroke. Smoking cigarettes or using other tobacco products. Taking birth control pills, especially if you also use tobacco. Heavy use of alcohol or drugs, especially cocaine and methamphetamine. Physical inactivity. What actions can I take to prevent this? Manage your health conditions High cholesterol levels. Eating a healthy diet is important for preventing high cholesterol. If cholesterol cannot be managed through diet alone, you may need to take medicines. Take any prescribed medicines to control your cholesterol as told by your health care provider. Hypertension. To reduce your risk of stroke, try to keep your blood pressure below 130/80. Eating a healthy diet and exercising regularly are important for controlling blood pressure. If these steps are not enough to manage your blood pressure, you may need to take medicines. Take any prescribed medicines to control hypertension as told by your health care provider. Ask your health care provider if you should monitor your blood pressure at home. Have your blood pressure checked every year, even if your blood pressure is normal. Blood pressure increases with age and some medical conditions. Diabetes. Eating a healthy diet and exercising regularly are important parts of managing your blood sugar (glucose). If your blood sugar cannot be managed through diet and exercise, you may need to take medicines. Take any prescribed medicines to control your diabetes as told by your health care provider. Get evaluated for obstructive sleep apnea. Talk to your health care provider about getting a sleep evaluation if you snore a lot or have excessive sleepiness. Make sure that any other medical conditions you have, such as atrial fibrillation or atherosclerosis, are managed. Nutrition Follow instructions from your health care provider about what to  eat or drink to help manage your health condition. These instructions may include: Reducing your daily calorie intake. Limiting how much salt (sodium) you use to 1,500 milligrams (mg) each day. Using only healthy fats for cooking, such as olive oil, canola oil, or sunflower oil. Eating healthy foods. You can do this by: Choosing foods that are high in fiber, such as whole grains, and fresh fruits and vegetables. Eating at least 5 servings of fruits and vegetables a day. Try to fill one-half of your plate with fruits and vegetables at each meal. Choosing lean protein foods, such as lean cuts of meat, poultry without skin, fish, tofu, beans, and nuts. Eating low-fat dairy products. Avoiding foods that are high in sodium. This can help lower blood pressure. Avoiding foods that have saturated fat, trans fat, and cholesterol. This can help prevent high cholesterol. Avoiding processed and prepared foods. Counting your daily carbohydrate intake.  Lifestyle If you drink alcohol: Limit how much you have to: 0-1 drink a day for women who are not pregnant. 0-2 drinks a day for men. Know how much alcohol is in your drink. In the [place] one drink equals one 12 oz bottle of beer ( ), one 5 oz glass of wine ( ), or one 1 oz glass of hard liquor (44mL). Do not use any products that contain nicotine or tobacco. These products include cigarettes, chewing tobacco, and vaping devices, such as e-cigarettes. If you need help quitting, ask your health care provider. Avoid secondhand smoke. Do not use drugs. Activity  Try to stay at a healthy weight. Get at least 30 minutes of exercise on most days, such as: Fast walking. Biking. Swimming. Medicines Take over-the-counter and prescription medicines only as told by your health care provider. Aspirin or blood thinners (antiplatelets or anticoagulants) may be recommended to reduce your risk of forming blood clots that can lead to stroke. Avoid taking  birth control pills. Talk to your health care provider about the risks of taking birth control pills if: You are over 39 years old. You smoke. You get very bad headaches. You have had a blood clot. Where to find more information American Stroke Association: www.strokeassociation.org Get help right away if: You or a loved one has any symptoms of a stroke. "BE FAST" is an easy way to remember the main warning signs of a stroke: B - Balance. Signs are dizziness, sudden trouble walking, or loss of balance. E - Eyes. Signs are trouble seeing or a sudden change in vision. F - Face. Signs are sudden weakness or numbness of the face, or the face or eyelid drooping on one side. A - Arms. Signs are weakness or numbness in an arm. This happens suddenly and usually on one side of the body. S - Speech. Signs are sudden trouble speaking, slurred speech, or trouble understanding what people say. T - Time. Time to call emergency services. Write down what time symptoms started. You or a loved one has other signs of a stroke, such as: A sudden, severe headache with no known cause. Nausea or vomiting. Seizure. These symptoms may represent a serious problem that is an emergency. Do not wait to see if the symptoms will go away. Get medical help right away. Call your local emergency services (911 in the [place] Do not drive yourself to the hospital. Summary You can help to prevent a stroke by eating healthy, exercising, not smoking, limiting alcohol intake, and managing any medical conditions you may have. Do not use any products that  contain nicotine or tobacco. These include cigarettes, chewing tobacco, and vaping devices, such as e-cigarettes. If you need help quitting, ask your health care provider. Remember "BE FAST" for warning signs of a stroke. Get help right away if you or a loved one has any of these signs. This information is not intended to replace advice given to you by your health care provider. Make  sure you discuss any questions you have with your health care provider. Document Revised: 01/27/2022 Document Reviewed: 01/27/2022 Elsevier Patient Education  2024 [employer].

## 2023-07-02 ENCOUNTER — Encounter: Payer: Self-pay | Admitting: Radiology

## 2023-07-07 ENCOUNTER — Telehealth: Payer: Self-pay | Admitting: Nurse Practitioner

## 2023-07-07 NOTE — Telephone Encounter (Signed)
 Please let Desiree Leon know that her blood pressure readings have been reviewed and appear to be stabilizing and normalizing.  Please advise patient to continue to abstain from excess salt in her diet and to continue to monitor blood pressures and contact our office if she sees readings greater than 140/90.  Please let me know if you have any questions.

## 2023-07-07 NOTE — Telephone Encounter (Signed)
 Left patient a detailed message, ok per DPR, with results. Advised to call back with any questions or concerns.

## 2023-07-08 DIAGNOSIS — I25119 Atherosclerotic heart disease of native coronary artery with unspecified angina pectoris: Secondary | ICD-10-CM | POA: Diagnosis not present

## 2023-07-08 DIAGNOSIS — I10 Essential (primary) hypertension: Secondary | ICD-10-CM | POA: Diagnosis not present

## 2023-07-15 DIAGNOSIS — I2609 Other pulmonary embolism with acute cor pulmonale: Secondary | ICD-10-CM | POA: Diagnosis not present

## 2023-07-15 DIAGNOSIS — J449 Chronic obstructive pulmonary disease, unspecified: Secondary | ICD-10-CM | POA: Diagnosis not present

## 2023-07-15 DIAGNOSIS — Z9981 Dependence on supplemental oxygen: Secondary | ICD-10-CM | POA: Diagnosis not present

## 2023-07-20 DIAGNOSIS — Z7901 Long term (current) use of anticoagulants: Secondary | ICD-10-CM | POA: Diagnosis not present

## 2023-07-20 DIAGNOSIS — D638 Anemia in other chronic diseases classified elsewhere: Secondary | ICD-10-CM | POA: Diagnosis not present

## 2023-07-20 DIAGNOSIS — E785 Hyperlipidemia, unspecified: Secondary | ICD-10-CM | POA: Diagnosis not present

## 2023-07-20 DIAGNOSIS — K219 Gastro-esophageal reflux disease without esophagitis: Secondary | ICD-10-CM | POA: Diagnosis not present

## 2023-07-20 DIAGNOSIS — R531 Weakness: Secondary | ICD-10-CM | POA: Diagnosis not present

## 2023-07-20 DIAGNOSIS — J449 Chronic obstructive pulmonary disease, unspecified: Secondary | ICD-10-CM | POA: Diagnosis not present

## 2023-07-20 DIAGNOSIS — I130 Hypertensive heart and chronic kidney disease with heart failure and stage 1 through stage 4 chronic kidney disease, or unspecified chronic kidney disease: Secondary | ICD-10-CM | POA: Diagnosis not present

## 2023-07-20 DIAGNOSIS — D6869 Other thrombophilia: Secondary | ICD-10-CM | POA: Diagnosis not present

## 2023-07-20 DIAGNOSIS — E1129 Type 2 diabetes mellitus with other diabetic kidney complication: Secondary | ICD-10-CM | POA: Diagnosis not present

## 2023-07-21 ENCOUNTER — Encounter: Payer: Self-pay | Admitting: Cardiology

## 2023-07-21 ENCOUNTER — Other Ambulatory Visit: Payer: Self-pay | Admitting: Cardiology

## 2023-07-21 ENCOUNTER — Ambulatory Visit: Payer: Medicare PPO | Attending: Cardiology | Admitting: Cardiology

## 2023-07-21 VITALS — BP 112/58 | HR 62 | Resp 16 | Ht 63.0 in | Wt 187.0 lb

## 2023-07-21 DIAGNOSIS — D6859 Other primary thrombophilia: Secondary | ICD-10-CM | POA: Diagnosis not present

## 2023-07-21 DIAGNOSIS — I10 Essential (primary) hypertension: Secondary | ICD-10-CM | POA: Diagnosis not present

## 2023-07-21 DIAGNOSIS — D500 Iron deficiency anemia secondary to blood loss (chronic): Secondary | ICD-10-CM

## 2023-07-21 DIAGNOSIS — I25118 Atherosclerotic heart disease of native coronary artery with other forms of angina pectoris: Secondary | ICD-10-CM | POA: Diagnosis not present

## 2023-07-21 NOTE — Patient Instructions (Signed)
 Medication Instructions:  Your physician has recommended you make the following change in your medication:  Increase metoprolol  tartrate to 12.5 mg by mouth twice daily   *If you need a refill on your cardiac medications before your next appointment, please call your pharmacy*  Lab Work: none If you have labs (blood work) drawn today and your tests are completely normal, you will receive your results only by: MyChart Message (if you have MyChart) OR A paper copy in the mail If you have any lab test that is abnormal or we need to change your treatment, we will call you to review the results.  Testing/Procedures: none  Follow-Up: At [facility], you and your health needs are our priority.  As part of our continuing mission to provide you with exceptional heart care, our providers are all part of one team.  This team includes your primary Cardiologist (physician) and Advanced Practice Providers or APPs (Physician Assistants and Nurse Practitioners) who all work together to provide you with the care you need, when you need it.  Your next appointment:   12 month(s)  Provider:   Knox Perl, MD    We recommend signing up for the patient portal called "MyChart".  Sign up information is provided on this After Visit Summary.  MyChart is used to connect with patients for Virtual Visits (Telemedicine).  Patients are able to view lab/test results, encounter notes, upcoming appointments, etc.  Non-urgent messages can be sent to your provider as well.   To learn more about what you can do with MyChart, go to [email or web address].   Other Instructions

## 2023-07-21 NOTE — Progress Notes (Signed)
 Cardiology Office Note:  .   Date:  07/21/2023  ID:  Scotty Cyphers, DOB [birth year], MRN [id number] PCP: Suan Elm, MD  [facility] Providers Cardiologist:  Knox Perl, MD   History of Present Illness: .   Desiree Leon is a 83 y.o. Caucasian female  follow-up of coronary artery disease h/o NSTEMI on 04/18/2014 S/P  balloon angioplasty of the LAD and D2 with 80% residual stenosis which was not amenable to further intervention due to the LAD being a small artery. and mild peripheral arterial disease, left leg sciatica and chronic back pain, hypertension and hyperlipidemia, history of acute PE in 2021 after she was admitted with sepsis and pneumonia, in March 2023 had very large bilateral pulmonary emboli leading to RV strain treated with Eliquis .  She had massive GI bleed on 04/23/2022 due to large colonic AVMs and received blood transfusion. She was again returned on 04/04/2023 with headache and dizziness MRI demonstrating acute/subacute 18 mm infarct in the left superior cerebellar felt to be cryptogenic with no significant vascular disease.  Event monitor and echocardiogram did not reveal any etiology and no atrial fibrillation.   Patient was admitted with lower GI bleed on 04/23/2022, found to have a large colonic AVM treated with APC and Uristat, also received blood transfusion.  Anticoagulation was reversed in view of active GI bleed.  She now presents for follow-up.  Discussed the use of AI scribe software for clinical note transcription with the patient, who gave verbal consent to proceed.  History of Present Illness Desiree Leon is an 83 year old female with coronary artery disease and iron  deficiency anemia who presents with chest pain. She experiences intermittent chest pain, which she associates with a recent reduction in her metoprolol  dosage. The pain is intense at times but often subsides within five minutes with rest. She does not use nitroglycerin  for relief.  She has  iron  deficiency anemia with recent lab results showing low iron  levels, iron  saturation at 5, and iron  content at 21. She feels weak and exhibits symptoms like pica. Previous blood work showed a hemoglobin level of 9.1 and hematocrit of 30.3, consistent with microcytic anemia.  She has no bleeding or black stools.    Labs   Lab Results  Component Value Date   CHOL 141 04/05/2023   HDL 43 04/05/2023   LDLCALC 77 04/05/2023   TRIG 107 04/05/2023   CHOLHDL 3.3 04/05/2023   Lab Results  Component Value Date   NA 140 06/11/2023   K 4.2 06/11/2023   CO2 23 06/11/2023   GLUCOSE 135 (H) 06/11/2023   BUN 12 06/11/2023   CREATININE 0.86 06/11/2023   CALCIUM 9.0 06/11/2023   EGFR 67 06/11/2023   GFRNONAA >60 04/06/2023      Latest Ref Rng & Units 06/11/2023    2:48 PM 04/06/2023   11:36 AM 04/05/2023   10:21 PM  BMP  Glucose 70 - 99 mg/dL 784  696  295   BUN 8 - 27 mg/dL 12  13  15    Creatinine 0.57 - 1.00 mg/dL 2.84  1.32  4.40   BUN/Creat Ratio 12 - 28 14     Sodium 134 - 144 mmol/L 140  139  141   Potassium 3.5 - 5.2 mmol/L 4.2  4.5  4.4   Chloride 96 - 106 mmol/L 102  103  103   CO2 20 - 29 mmol/L 23  27  24    Calcium 8.7 - 10.3 mg/dL  9.0  9.0  9.3       Latest Ref Rng & Units 06/16/2023    5:52 AM 06/11/2023    2:48 PM 04/06/2023   11:36 AM  CBC  WBC 4.0 - 10.5 K/uL 9.5  7.9  7.6   Hemoglobin 12.0 - 15.0 g/dL 9.1  8.7  9.7   Hematocrit 36.0 - 46.0 % 30.3  28.8  31.3   Platelets 150 - 400 K/uL 347  322  280    Lab Results  Component Value Date   HGBA1C 6.4 (H) 04/04/2023    Lab Results  Component Value Date   TSH 7.164 (H) 04/04/2023    External Labs:    ROS  Review of Systems  Cardiovascular:  Positive for chest pain and leg swelling (stable). Negative for dyspnea on exertion.    Physical Exam:   VS:  BP (!) 112/58 (BP Location: Left Arm, Patient Position: Sitting, Cuff Size: Large)   Pulse 62   Resp 16   Ht 5\' 3"  (1.6 m)   Wt 187 lb (84.8 kg)   SpO2 97%    BMI 33.13 kg/m    Wt Readings from Last 3 Encounters:  07/21/23 187 lb (84.8 kg)  06/24/23 186 lb 9.6 oz (84.6 kg)  06/16/23 186 lb (84.4 kg)    Physical Exam Neck:     Vascular: No carotid bruit or JVD.  Cardiovascular:     Rate and Rhythm: Normal rate and regular rhythm.     Pulses: Intact distal pulses.          Dorsalis pedis pulses are 0 on the right side and 0 on the left side.       Posterior tibial pulses are 0 on the right side and 0 on the left side.     Heart sounds: Normal heart sounds. No murmur heard.    No gallop.  Pulmonary:     Effort: Pulmonary effort is normal.     Breath sounds: Normal breath sounds.  Abdominal:     General: Bowel sounds are normal.     Palpations: Abdomen is soft.  Musculoskeletal:     Right lower leg: Edema (trace) present.     Left lower leg: Edema (trace) present.    Studies Reviewed: Aaron Aas     EKG:    EKG Interpretation Date/Time:  Tuesday Jul 21 2023 09:53:34 EDT Ventricular Rate:  65 PR Interval:  210 QRS Duration:  100 QT Interval:  438 QTC Calculation: 455 R Axis:   23  Text Interpretation: EKG 07/21/2023: Sinus rhythm with first-degree AV block at rate of 65 bpm, normal axis, low-voltage complexes otherwise normal.  Compared to 06/11/2023, no significant change. Confirmed by Walida Cajas, Jagadeesh ([id number]) on 07/21/2023 10:11:10 AM    Medications and allergies    Allergies  Allergen Reactions   Other Anaphylaxis and Cough    fragarances-perfumes   Dilaudid [Hydromorphone Hcl] Nausea Only   Erythromycin Other (See Comments)   Diflucan [Fluconazole] Palpitations and Hypertension    When taken with Nicardipine , reaction happens.     Current Outpatient Medications:    acetaminophen  (TYLENOL ) 500 MG tablet, Take 500 mg by mouth daily as needed for fever or headache., Disp: , Rfl:    ALPRAZolam  (XANAX ) 0.25 MG tablet, Take 0.25 mg by mouth daily as needed for anxiety., Disp: , Rfl:    apixaban  (ELIQUIS ) 2.5 MG TABS tablet, Take 1  tablet (2.5 mg total) by mouth 2 (two) times daily. Resume on 4/8 evening,  Disp: 180 tablet, Rfl: 3   budesonide -formoterol  (SYMBICORT ) 160-4.5 MCG/ACT inhaler, Inhale 2 puffs into the lungs 2 (two) times daily., Disp: 1 each, Rfl: 12   cholecalciferol  (VITAMIN D ) 25 MCG (1000 UNIT) tablet, Take 2,000 Units by mouth daily., Disp: , Rfl:    COLLAGEN PO, Take 10 g by mouth daily., Disp: , Rfl:    escitalopram  (LEXAPRO ) 5 MG tablet, Take 5 mg by mouth every evening. , Disp: , Rfl:    furosemide  (LASIX ) 20 MG tablet, Take 20 mg by mouth daily as needed for fluid or edema., Disp: , Rfl:    glucose blood test strip, Accu-Chek SmartView Test Strips  CHECK BLOOD SUGAR TWICE DAILY., Disp: , Rfl:    levothyroxine  (SYNTHROID ) 125 MCG tablet, Take 125 mcg by mouth daily., Disp: , Rfl:    methocarbamol (ROBAXIN) 500 MG tablet, Take 250-500 mg by mouth daily as needed for muscle spasms., Disp: , Rfl:    multivitamin-iron -minerals-folic acid (CENTRUM) chewable tablet, Chew 1 tablet by mouth daily., Disp: , Rfl:    niCARdipine  (CARDENE ) 20 MG capsule, Take 20 mg by mouth at bedtime., Disp: , Rfl:    nitroGLYCERIN  (NITRODUR - DOSED IN MG/24 HR) 0.4 mg/hr patch, Place 0.4 mg onto the skin daily., Disp: , Rfl:    olopatadine (PATADAY) 0.1 % ophthalmic solution, Place 1 drop into both eyes daily. 24 hr, Disp: , Rfl:    oxyCODONE  (OXY IR/ROXICODONE ) 5 MG immediate release tablet, Take 5 mg by mouth daily as needed for severe pain., Disp: , Rfl:    OXYGEN , Inhale 2.5 L into the lungs continuous., Disp: , Rfl:    pantoprazole  (PROTONIX ) 40 MG tablet, Take 1 tablet (40 mg total) by mouth daily at 12 noon. (Patient taking differently: Take 40 mg by mouth 2 (two) times daily.), Disp: 30 tablet, Rfl: 1   Probiotic Product (ALIGN) 4 MG CAPS, Take 4 mg by mouth daily., Disp: , Rfl:    ranolazine  (RANEXA ) 500 MG 12 hr tablet, TAKE ONE TABLET BY MOUTH TWICE DAILY, Disp: 180 tablet, Rfl: 3   triamcinolone cream (KENALOG) 0.1 %,  Apply 1 Application topically daily. On both legs.With vitamin E oil, Disp: , Rfl:    VYTORIN  10-20 MG per tablet, Take 1 tablet by mouth daily with supper., Disp: , Rfl:    metoprolol  tartrate (LOPRESSOR ) 25 MG tablet, Take 0.5 tablets (12.5 mg total) by mouth 2 (two) times daily., Disp: , Rfl:    nitroGLYCERIN  (NITROSTAT ) 0.4 MG SL tablet, Place 1 tablet (0.4 mg total) under the tongue every 5 (five) minutes as needed for chest pain., Disp: 25 tablet, Rfl: 11   No orders of the defined types were placed in this encounter.    Medications Discontinued During This Encounter  Medication Reason   metoprolol  tartrate (LOPRESSOR ) 25 MG tablet      ASSESSMENT AND PLAN: .      ICD-10-CM   1. Coronary artery disease of native artery of native heart with stable angina pectoris (HCC)  I25.118 EKG 12-Lead    metoprolol  tartrate (LOPRESSOR ) 25 MG tablet    2. Primary hypertension  I10     3. Primary hypercoagulable state (HCC)  D68.59     4. Iron  deficiency anemia due to chronic blood loss  D50.0       Assessment and Plan Assessment & Plan Coronary artery disease with angina   Intermittent chest pain is likely due to coronary artery disease and iron  deficiency anemia. Her current medications include  metoprolol , nitroglycerin , and Ranexa . The metoprolol  dose is increased to 12.5 mg twice daily to address symptoms. She should continue using nitroglycerin  as needed for intense or persistent chest pain and maintain Ranexa  at 500 mg twice daily. Anemia may contribute to chest pain due to reduced oxygen  carrying capacity.  Primary hypertension Blood pressure is well-controlled continue present medical regimen.  Renal function has remained stable.  Iron  deficiency anemia   Iron  deficiency anemia persists with low iron  saturation and microcytic indices, potentially contributing to chest pain. Discussed the potential for iron  infusion to improve symptoms more rapidly than oral supplementation.  Coordination with Dr. Delorise Few is needed for a potential iron  infusion and to discuss the possibility of a transfusion due to increased chest pain.  Pulmonary embolism   She has a history of pulmonary embolism and is currently on Eliquis  for DVT prophylaxis in view of severe life-threatening GI bleed at the same time without DVT prophylaxis he is at high risk for recurrence of DVT and PE. Reports occasional leg pain but no signs of acute DVT. The risk of clotting is reduced due to anticoagulation therapy. Swelling, redness, and persistent leg pain could indicate a clot, but current symptoms do not suggest this. She should continue Eliquis  at 2.5 mg twice daily.  Signed,  Knox Perl, MD, [facility] 07/21/2023, 8:25 PM [facility] [address] [place], [place] [address] Phone: [phone]. Fax:  [phone]

## 2023-07-28 DIAGNOSIS — E119 Type 2 diabetes mellitus without complications: Secondary | ICD-10-CM | POA: Diagnosis not present

## 2023-07-28 DIAGNOSIS — H35371 Puckering of macula, right eye: Secondary | ICD-10-CM | POA: Diagnosis not present

## 2023-07-28 DIAGNOSIS — I69898 Other sequelae of other cerebrovascular disease: Secondary | ICD-10-CM | POA: Diagnosis not present

## 2023-07-28 DIAGNOSIS — H40013 Open angle with borderline findings, low risk, bilateral: Secondary | ICD-10-CM | POA: Diagnosis not present

## 2023-08-05 ENCOUNTER — Other Ambulatory Visit (HOSPITAL_COMMUNITY): Payer: Self-pay | Admitting: *Deleted

## 2023-08-06 ENCOUNTER — Ambulatory Visit (HOSPITAL_COMMUNITY)
Admission: RE | Admit: 2023-08-06 | Discharge: 2023-08-06 | Disposition: A | Discharge status: Home / Self Care | Source: Ambulatory Visit | Attending: Internal Medicine | Admitting: Internal Medicine

## 2023-08-06 DIAGNOSIS — D649 Anemia, unspecified: Secondary | ICD-10-CM | POA: Diagnosis not present

## 2023-08-06 MED ORDER — SODIUM CHLORIDE 0.9 % IV SOLN
510.0000 mg | INTRAVENOUS | Status: DC
  Administered 2023-08-06: 510 mg via INTRAVENOUS
  Filled 2023-08-06: qty 510

## 2023-08-07 DIAGNOSIS — I25119 Atherosclerotic heart disease of native coronary artery with unspecified angina pectoris: Secondary | ICD-10-CM | POA: Diagnosis not present

## 2023-08-07 DIAGNOSIS — I10 Essential (primary) hypertension: Secondary | ICD-10-CM | POA: Diagnosis not present

## 2023-08-12 ENCOUNTER — Telehealth: Payer: Self-pay

## 2023-08-12 NOTE — Telephone Encounter (Signed)
 Auth Submission: APPROVED Site of care: Site of care: [facility] Payer: [employer] medicare Medication & CPT/J Code(s) submitted: Feraheme  (ferumoxytol ) [id number] Route of submission (phone, fax, portal):  Phone # Fax # Auth type: Buy/Bill PB Units/visits requested: 510mg  x 2 doses Reference number: [id number] Approval from: 06/17/22 to 03/09/24

## 2023-08-13 ENCOUNTER — Encounter (HOSPITAL_COMMUNITY)
Admission: RE | Admit: 2023-08-13 | Discharge: 2023-08-13 | Disposition: A | Discharge status: Home / Self Care | Source: Ambulatory Visit | Attending: Internal Medicine | Admitting: Internal Medicine

## 2023-08-13 DIAGNOSIS — D649 Anemia, unspecified: Secondary | ICD-10-CM | POA: Insufficient documentation

## 2023-08-13 MED ORDER — SODIUM CHLORIDE 0.9 % IV SOLN
510.0000 mg | INTRAVENOUS | Status: DC
  Administered 2023-08-13: 510 mg via INTRAVENOUS
  Filled 2023-08-13: qty 510

## 2023-08-15 DIAGNOSIS — Z9981 Dependence on supplemental oxygen: Secondary | ICD-10-CM | POA: Diagnosis not present

## 2023-08-15 DIAGNOSIS — I2609 Other pulmonary embolism with acute cor pulmonale: Secondary | ICD-10-CM | POA: Diagnosis not present

## 2023-08-15 DIAGNOSIS — J449 Chronic obstructive pulmonary disease, unspecified: Secondary | ICD-10-CM | POA: Diagnosis not present

## 2023-08-25 ENCOUNTER — Telehealth: Payer: Self-pay | Admitting: Internal Medicine

## 2023-08-25 NOTE — Telephone Encounter (Signed)
 Rc'd fax for 02. PT not seen in two years. Asking Dr. Diania Fortes if still OK to fill RX.

## 2023-08-26 ENCOUNTER — Encounter: Payer: Self-pay | Admitting: Cardiology

## 2023-08-26 ENCOUNTER — Other Ambulatory Visit: Payer: Self-pay

## 2023-08-26 DIAGNOSIS — I25118 Atherosclerotic heart disease of native coronary artery with other forms of angina pectoris: Secondary | ICD-10-CM

## 2023-08-26 MED ORDER — METOPROLOL TARTRATE 25 MG PO TABS: 12.5000 mg | ORAL_TABLET | Freq: Two times a day (BID) | ORAL | 3 refills | Status: DC

## 2023-08-26 NOTE — Progress Notes (Signed)
 Pt states that the pharmacy only has her Lopressor  as half a tablet once daily and only gave her 15 days worth. Refill placed for 30 days and 3 refills.

## 2023-08-26 NOTE — Telephone Encounter (Signed)
 Dr. Lucrezia Sachs nurse said needs appt. I let PT know and put in a recall for her to be seen once sched comes out. NFN

## 2023-09-07 DIAGNOSIS — I25119 Atherosclerotic heart disease of native coronary artery with unspecified angina pectoris: Secondary | ICD-10-CM | POA: Diagnosis not present

## 2023-09-07 DIAGNOSIS — I10 Essential (primary) hypertension: Secondary | ICD-10-CM | POA: Diagnosis not present

## 2023-09-08 ENCOUNTER — Other Ambulatory Visit: Payer: Self-pay

## 2023-09-08 DIAGNOSIS — I25118 Atherosclerotic heart disease of native coronary artery with other forms of angina pectoris: Secondary | ICD-10-CM

## 2023-09-08 MED ORDER — METOPROLOL TARTRATE 25 MG PO TABS: 12.5000 mg | ORAL_TABLET | Freq: Two times a day (BID) | ORAL | 3 refills | Status: AC

## 2023-09-09 ENCOUNTER — Encounter: Payer: Self-pay | Admitting: Cardiology

## 2023-09-14 DIAGNOSIS — Z9981 Dependence on supplemental oxygen: Secondary | ICD-10-CM | POA: Diagnosis not present

## 2023-09-14 DIAGNOSIS — J449 Chronic obstructive pulmonary disease, unspecified: Secondary | ICD-10-CM | POA: Diagnosis not present

## 2023-09-14 DIAGNOSIS — I2609 Other pulmonary embolism with acute cor pulmonale: Secondary | ICD-10-CM | POA: Diagnosis not present

## 2023-10-07 DIAGNOSIS — I10 Essential (primary) hypertension: Secondary | ICD-10-CM | POA: Diagnosis not present

## 2023-10-07 DIAGNOSIS — I25119 Atherosclerotic heart disease of native coronary artery with unspecified angina pectoris: Secondary | ICD-10-CM | POA: Diagnosis not present

## 2023-10-15 DIAGNOSIS — J449 Chronic obstructive pulmonary disease, unspecified: Secondary | ICD-10-CM | POA: Diagnosis not present

## 2023-10-15 DIAGNOSIS — I2609 Other pulmonary embolism with acute cor pulmonale: Secondary | ICD-10-CM | POA: Diagnosis not present

## 2023-10-15 DIAGNOSIS — Z9981 Dependence on supplemental oxygen: Secondary | ICD-10-CM | POA: Diagnosis not present

## 2023-10-19 ENCOUNTER — Ambulatory Visit: Payer: Medicare PPO | Admitting: Cardiology

## 2023-11-07 DIAGNOSIS — I25119 Atherosclerotic heart disease of native coronary artery with unspecified angina pectoris: Secondary | ICD-10-CM | POA: Diagnosis not present

## 2023-11-07 DIAGNOSIS — I10 Essential (primary) hypertension: Secondary | ICD-10-CM | POA: Diagnosis not present

## 2023-11-11 DIAGNOSIS — Z6832 Body mass index (BMI) 32.0-32.9, adult: Secondary | ICD-10-CM | POA: Diagnosis not present

## 2023-11-11 DIAGNOSIS — Z1231 Encounter for screening mammogram for malignant neoplasm of breast: Secondary | ICD-10-CM | POA: Diagnosis not present

## 2023-11-11 DIAGNOSIS — Z01419 Encounter for gynecological examination (general) (routine) without abnormal findings: Secondary | ICD-10-CM | POA: Diagnosis not present

## 2023-11-15 DIAGNOSIS — I2609 Other pulmonary embolism with acute cor pulmonale: Secondary | ICD-10-CM | POA: Diagnosis not present

## 2023-11-15 DIAGNOSIS — Z9981 Dependence on supplemental oxygen: Secondary | ICD-10-CM | POA: Diagnosis not present

## 2023-11-15 DIAGNOSIS — J449 Chronic obstructive pulmonary disease, unspecified: Secondary | ICD-10-CM | POA: Diagnosis not present

## 2023-11-16 DIAGNOSIS — D638 Anemia in other chronic diseases classified elsewhere: Secondary | ICD-10-CM | POA: Diagnosis not present

## 2023-11-16 DIAGNOSIS — E039 Hypothyroidism, unspecified: Secondary | ICD-10-CM | POA: Diagnosis not present

## 2023-11-16 DIAGNOSIS — E785 Hyperlipidemia, unspecified: Secondary | ICD-10-CM | POA: Diagnosis not present

## 2023-11-16 DIAGNOSIS — E119 Type 2 diabetes mellitus without complications: Secondary | ICD-10-CM | POA: Diagnosis not present

## 2023-11-23 ENCOUNTER — Encounter: Payer: Self-pay | Admitting: Internal Medicine

## 2023-11-23 ENCOUNTER — Other Ambulatory Visit: Payer: Self-pay | Admitting: Internal Medicine

## 2023-11-23 DIAGNOSIS — I5042 Chronic combined systolic (congestive) and diastolic (congestive) heart failure: Secondary | ICD-10-CM | POA: Diagnosis not present

## 2023-11-23 DIAGNOSIS — N1832 Chronic kidney disease, stage 3b: Secondary | ICD-10-CM | POA: Diagnosis not present

## 2023-11-23 DIAGNOSIS — Z1339 Encounter for screening examination for other mental health and behavioral disorders: Secondary | ICD-10-CM | POA: Diagnosis not present

## 2023-11-23 DIAGNOSIS — J449 Chronic obstructive pulmonary disease, unspecified: Secondary | ICD-10-CM | POA: Diagnosis not present

## 2023-11-23 DIAGNOSIS — Z23 Encounter for immunization: Secondary | ICD-10-CM | POA: Diagnosis not present

## 2023-11-23 DIAGNOSIS — R1084 Generalized abdominal pain: Secondary | ICD-10-CM

## 2023-11-23 DIAGNOSIS — Z0000 Encounter for general adult medical examination without abnormal findings: Secondary | ICD-10-CM | POA: Diagnosis not present

## 2023-11-23 DIAGNOSIS — R82998 Other abnormal findings in urine: Secondary | ICD-10-CM | POA: Diagnosis not present

## 2023-11-23 DIAGNOSIS — I872 Venous insufficiency (chronic) (peripheral): Secondary | ICD-10-CM | POA: Diagnosis not present

## 2023-11-23 DIAGNOSIS — D6869 Other thrombophilia: Secondary | ICD-10-CM | POA: Diagnosis not present

## 2023-11-23 DIAGNOSIS — Z1331 Encounter for screening for depression: Secondary | ICD-10-CM | POA: Diagnosis not present

## 2023-11-23 DIAGNOSIS — I25110 Atherosclerotic heart disease of native coronary artery with unstable angina pectoris: Secondary | ICD-10-CM | POA: Diagnosis not present

## 2023-11-23 DIAGNOSIS — E1129 Type 2 diabetes mellitus with other diabetic kidney complication: Secondary | ICD-10-CM | POA: Diagnosis not present

## 2023-11-23 DIAGNOSIS — I130 Hypertensive heart and chronic kidney disease with heart failure and stage 1 through stage 4 chronic kidney disease, or unspecified chronic kidney disease: Secondary | ICD-10-CM | POA: Diagnosis not present

## 2023-11-24 ENCOUNTER — Ambulatory Visit
Admission: RE | Admit: 2023-11-24 | Discharge: 2023-11-24 | Disposition: A | Discharge status: Home / Self Care | Source: Ambulatory Visit | Attending: Internal Medicine | Admitting: Internal Medicine

## 2023-11-24 DIAGNOSIS — R1084 Generalized abdominal pain: Secondary | ICD-10-CM | POA: Diagnosis not present

## 2023-12-08 DIAGNOSIS — I25119 Atherosclerotic heart disease of native coronary artery with unspecified angina pectoris: Secondary | ICD-10-CM | POA: Diagnosis not present

## 2023-12-08 DIAGNOSIS — I10 Essential (primary) hypertension: Secondary | ICD-10-CM | POA: Diagnosis not present

## 2023-12-10 ENCOUNTER — Emergency Department (HOSPITAL_COMMUNITY)

## 2023-12-10 ENCOUNTER — Emergency Department (HOSPITAL_COMMUNITY)
Admission: EM | Admit: 2023-12-10 | Discharge: 2023-12-10 | Disposition: A | Discharge status: Home / Self Care | Attending: Emergency Medicine | Admitting: Emergency Medicine

## 2023-12-10 ENCOUNTER — Other Ambulatory Visit: Payer: Self-pay

## 2023-12-10 ENCOUNTER — Encounter (HOSPITAL_COMMUNITY): Payer: Self-pay

## 2023-12-10 DIAGNOSIS — Z7951 Long term (current) use of inhaled steroids: Secondary | ICD-10-CM | POA: Diagnosis not present

## 2023-12-10 DIAGNOSIS — Z79899 Other long term (current) drug therapy: Secondary | ICD-10-CM | POA: Insufficient documentation

## 2023-12-10 DIAGNOSIS — J449 Chronic obstructive pulmonary disease, unspecified: Secondary | ICD-10-CM | POA: Insufficient documentation

## 2023-12-10 DIAGNOSIS — Z86711 Personal history of pulmonary embolism: Secondary | ICD-10-CM | POA: Diagnosis not present

## 2023-12-10 DIAGNOSIS — N189 Chronic kidney disease, unspecified: Secondary | ICD-10-CM | POA: Diagnosis not present

## 2023-12-10 DIAGNOSIS — R1084 Generalized abdominal pain: Secondary | ICD-10-CM | POA: Diagnosis not present

## 2023-12-10 DIAGNOSIS — R1900 Intra-abdominal and pelvic swelling, mass and lump, unspecified site: Secondary | ICD-10-CM | POA: Diagnosis not present

## 2023-12-10 DIAGNOSIS — Z7901 Long term (current) use of anticoagulants: Secondary | ICD-10-CM | POA: Insufficient documentation

## 2023-12-10 DIAGNOSIS — E1122 Type 2 diabetes mellitus with diabetic chronic kidney disease: Secondary | ICD-10-CM | POA: Diagnosis not present

## 2023-12-10 DIAGNOSIS — R109 Unspecified abdominal pain: Secondary | ICD-10-CM | POA: Diagnosis not present

## 2023-12-10 DIAGNOSIS — I130 Hypertensive heart and chronic kidney disease with heart failure and stage 1 through stage 4 chronic kidney disease, or unspecified chronic kidney disease: Secondary | ICD-10-CM | POA: Diagnosis not present

## 2023-12-10 DIAGNOSIS — I700 Atherosclerosis of aorta: Secondary | ICD-10-CM | POA: Diagnosis not present

## 2023-12-10 DIAGNOSIS — Z8673 Personal history of transient ischemic attack (TIA), and cerebral infarction without residual deficits: Secondary | ICD-10-CM | POA: Insufficient documentation

## 2023-12-10 DIAGNOSIS — I509 Heart failure, unspecified: Secondary | ICD-10-CM | POA: Insufficient documentation

## 2023-12-10 DIAGNOSIS — K551 Chronic vascular disorders of intestine: Secondary | ICD-10-CM | POA: Diagnosis not present

## 2023-12-10 LAB — I-STAT CHEM 8, ED
BUN: 18 mg/dL (ref 8–23)
Calcium, Ion: 1.08 mmol/L — ABNORMAL LOW (ref 1.15–1.40)
Chloride: 106 mmol/L (ref 98–111)
Creatinine, Ser: 0.7 mg/dL (ref 0.44–1.00)
Glucose, Bld: 127 mg/dL — ABNORMAL HIGH (ref 70–99)
HCT: 36 % (ref 36.0–46.0)
Hemoglobin: 12.2 g/dL (ref 12.0–15.0)
Potassium: 4 mmol/L (ref 3.5–5.1)
Sodium: 140 mmol/L (ref 135–145)
TCO2: 23 mmol/L (ref 22–32)

## 2023-12-10 LAB — COMPREHENSIVE METABOLIC PANEL WITH GFR
ALT: 14 U/L (ref 0–44)
AST: 18 U/L (ref 15–41)
Albumin: 3.5 g/dL (ref 3.5–5.0)
Alkaline Phosphatase: 69 U/L (ref 38–126)
Anion gap: 13 (ref 5–15)
BUN: 15 mg/dL (ref 8–23)
CO2: 20 mmol/L — ABNORMAL LOW (ref 22–32)
Calcium: 8.8 mg/dL — ABNORMAL LOW (ref 8.9–10.3)
Chloride: 106 mmol/L (ref 98–111)
Creatinine, Ser: 0.74 mg/dL (ref 0.44–1.00)
GFR, Estimated: >60 mL/min (ref >60)
Glucose, Bld: 123 mg/dL — ABNORMAL HIGH (ref 70–99)
Potassium: 4 mmol/L (ref 3.5–5.1)
Sodium: 139 mmol/L (ref 135–145)
Total Bilirubin: 0.8 mg/dL (ref 0.0–1.2)
Total Protein: 6 g/dL — ABNORMAL LOW (ref 6.5–8.1)

## 2023-12-10 LAB — CBC WITH DIFFERENTIAL/PLATELET
Abs Immature Granulocytes: 0.03 K/uL (ref 0.00–0.07)
Basophils Absolute: 0 K/uL (ref 0.0–0.1)
Basophils Relative: 0 %
Eosinophils Absolute: 0.1 K/uL (ref 0.0–0.5)
Eosinophils Relative: 1 %
HCT: 33.4 % — ABNORMAL LOW (ref 36.0–46.0)
Hemoglobin: 11 g/dL — ABNORMAL LOW (ref 12.0–15.0)
Immature Granulocytes: 0 %
Lymphocytes Relative: 33 %
Lymphs Abs: 2.7 K/uL (ref 0.7–4.0)
MCH: 32.2 pg (ref 26.0–34.0)
MCHC: 32.9 g/dL (ref 30.0–36.0)
MCV: 97.7 fL (ref 80.0–100.0)
Monocytes Absolute: 0.7 K/uL (ref 0.1–1.0)
Monocytes Relative: 8 %
Neutro Abs: 4.7 K/uL (ref 1.7–7.7)
Neutrophils Relative %: 58 %
Platelets: 228 K/uL (ref 150–400)
RBC: 3.42 MIL/uL — ABNORMAL LOW (ref 3.87–5.11)
RDW: 13.8 % (ref 11.5–15.5)
WBC: 8.2 K/uL (ref 4.0–10.5)
nRBC: 0 % (ref 0.0–0.2)

## 2023-12-10 LAB — LIPASE, BLOOD: Lipase: 30 U/L (ref 11–51)

## 2023-12-10 LAB — I-STAT CG4 LACTIC ACID, ED: Lactic Acid, Venous: 1 mmol/L (ref 0.5–1.9)

## 2023-12-10 LAB — BRAIN NATRIURETIC PEPTIDE: B Natriuretic Peptide: 132.5 pg/mL — ABNORMAL HIGH (ref 0.0–100.0)

## 2023-12-10 MED ORDER — IOHEXOL 350 MG/ML SOLN
75.0000 mL | Freq: Once | INTRAVENOUS | Status: AC | PRN
  Administered 2023-12-10: 75 mL via INTRAVENOUS

## 2023-12-10 MED ORDER — SODIUM CHLORIDE 0.9 % IV BOLUS
1000.0000 mL | Freq: Once | INTRAVENOUS | Status: AC
  Administered 2023-12-10: 1000 mL via INTRAVENOUS

## 2023-12-10 NOTE — ED Provider Triage Note (Signed)
 Emergency Medicine Provider Triage Evaluation Note  Desiree Leon , a 83 y.o. female  was evaluated in triage.  Pt complains of left-sided abdominal pain, as well as abdominal swelling.  Has history of CKD as well as HFpEF, echo in January 2025 shows EF of 60 to 65% with left atrial dilation.  Pain in the left abdomen has been chronic for the last 2 years however she has had worsening pain in the left side of her abdomen over the last week, increasing swelling of the abdomen, increasing lower extremity edema.  Also having shortness of breath secondary to increased abdominal swelling.  Review of Systems  Positive: As above Negative:   Physical Exam  BP (!) 126/104   Pulse 64   Temp 98 F (36.7 C)   Resp 19   SpO2 98%  Gen:   Awake, no distress   Resp:  Normal effort  MSK:   Moves extremities without difficulty  Other:  Abdomen is swollen and diffusely tender, there is +2 bilateral pitting edema in the lower extremities.  Medical Decision Making  Medically screening exam initiated at 2:21 PM.  Appropriate orders placed.  Desiree Leon was informed that the remainder of the evaluation will be completed by another provider, this initial triage assessment does not replace that evaluation, and the importance of remaining in the ED until their evaluation is complete.  Initial lab work for abdominal pain as well as evaluation of heart failure placed.   Desiree Leon, Desiree Leon 12/10/23 [phone]

## 2023-12-10 NOTE — Discharge Instructions (Addendum)
 Please read and follow all provided instructions.  Your diagnoses today include:  1. Generalized abdominal pain    Tests performed today include: Complete blood cell count: Normal white blood cell count, mild anemia but better than it has been over the past year Complete metabolic panel: Slightly high blood sugar but no other concerning findings Lactic acid: Was normal CT scan of your abdomen pelvis does not show any emergent findings or obvious explanations for your pain, you do have a blockage in one of the arteries in the abdomen, however does not appear to be an acute problem and this can be followed up by your primary care doctor.  Please continue your anticoagulation. Vital signs. See below for your results today.   Medications prescribed:  Use over-the-counter Tylenol  for abdominal pain  Take any prescribed medications only as directed.  Home care instructions:  Follow any educational materials contained in this packet. Avoid foods or medications that make your symptoms worse, try to adhere to a bland diet to see if this helps minimize symptoms.  Follow-up instructions: Please follow-up with your primary care provider in the next 3 days for further evaluation of your symptoms.    Return instructions:  SEEK IMMEDIATE MEDICAL ATTENTION IF: The pain does not go away or becomes severe  A temperature above 101F develops  Repeated vomiting occurs (multiple episodes)  The pain becomes localized to portions of the abdomen. The right side could possibly be appendicitis. In an adult, the left lower portion of the abdomen could be colitis or diverticulitis.  Blood is being passed in stools or vomit (bright red or black tarry stools)  You develop chest pain, difficulty breathing, dizziness or fainting, or become confused, poorly responsive, or inconsolable (young children) If you have any other emergent concerns regarding your health  Additional Information: Abdominal (belly) pain can  be caused by many things. Your caregiver performed an examination and possibly ordered blood/urine tests and imaging (CT scan, x-rays, ultrasound). Many cases can be observed and treated at home after initial evaluation in the emergency department. Even though you are being discharged home, abdominal pain can be unpredictable. Therefore, you need a repeated exam if your pain does not resolve, returns, or worsens. Most patients with abdominal pain don't have to be admitted to the hospital or have surgery, but serious problems like appendicitis and gallbladder attacks can start out as nonspecific pain. Many abdominal conditions cannot be diagnosed in one visit, so follow-up evaluations are very important.  Your vital signs today were: BP (!) 126/104   Pulse 64   Temp 98 F (36.7 C)   Resp 19   Ht 5' 3 (1.6 m)   Wt 85.3 kg   SpO2 98%   BMI 33.30 kg/m  If your blood pressure (bp) was elevated above 135/85 this visit, please have this repeated by your doctor within one month. --------------

## 2023-12-10 NOTE — ED Provider Notes (Signed)
 [place] EMERGENCY DEPARTMENT AT [facility] Provider Note   CSN: [id number] Arrival date & time: 12/10/23  1346     Patient presents with: No chief complaint on file.   Desiree Leon is a 83 y.o. female.   Patient with history of hypertension, diabetes, COPD, heart failure, chronic kidney disease, PE on anticoagulation and compliant, history of stroke --presents to the emergency department today for evaluation of abdominal pain.  Patient reports dealing with abdominal pain for about 2 years.  She reports that initially the pain was just in a small area on the left side of the abdomen with palpation.  She states that she did talk with her primary care doctor about it and initially were just watching the area.  Over the past couple of weeks she has had increasing pain and subjective swelling of the abdomen.  She saw her PCP on 9/15 and they ordered a CT scan which was done on 9/16.  This was unremarkable.  This was a noncontrasted study.  Patient states that the pain is worsened.  It is worse with movement and palpation.  The pain is worse on the left side but the pain also spreads to the right side now which is progressive.  No nausea or vomiting.  No weight loss.  No diarrhea or constipation.  No urinary symptoms.       Prior to Admission medications   Medication Sig Start Date End Date Taking? Authorizing Provider  acetaminophen  (TYLENOL ) 500 MG tablet Take 500 mg by mouth daily as needed for fever or headache.    [provider]  ALPRAZolam  (XANAX ) 0.25 MG tablet Take 0.25 mg by mouth daily as needed for anxiety.    [provider]  apixaban  (ELIQUIS ) 2.5 MG TABS tablet Take 1 tablet (2.5 mg total) by mouth 2 (two) times daily. Resume on 4/8 evening 06/16/23   Patwardhan, Newman PARAS, MD  budesonide -formoterol  (SYMBICORT ) 160-4.5 MCG/ACT inhaler Inhale 2 puffs into the lungs 2 (two) times daily. 06/24/21   Kara Dorn NOVAK, MD  cholecalciferol  (VITAMIN D ) 25  MCG (1000 UNIT) tablet Take 2,000 Units by mouth daily.    [provider]  COLLAGEN PO Take 10 g by mouth daily.    [provider]  escitalopram  (LEXAPRO ) 5 MG tablet Take 5 mg by mouth every evening.     [provider]  furosemide  (LASIX ) 20 MG tablet Take 20 mg by mouth daily as needed for fluid or edema.    [provider]  glucose blood test strip Accu-Chek SmartView Test Strips  CHECK BLOOD SUGAR TWICE DAILY.    [provider]  levothyroxine  (SYNTHROID ) 125 MCG tablet Take 125 mcg by mouth daily. 04/07/23   [provider]  methocarbamol (ROBAXIN) 500 MG tablet Take 250-500 mg by mouth daily as needed for muscle spasms.    [provider]  metoprolol  tartrate (LOPRESSOR ) 25 MG tablet Take 0.5 tablets (12.5 mg total) by mouth 2 (two) times daily. 09/08/23   Ladona Heinz, MD  multivitamin-iron -minerals-folic acid (CENTRUM) chewable tablet Chew 1 tablet by mouth daily.    [provider]  niCARdipine  (CARDENE ) 20 MG capsule Take 20 mg by mouth at bedtime.    [provider]  nitroGLYCERIN  (NITRODUR - DOSED IN MG/24 HR) 0.4 mg/hr patch Place 0.4 mg onto the skin daily.    [provider]  nitroGLYCERIN  (NITROSTAT ) 0.4 MG SL tablet Place 1 tablet (0.4 mg total) under the tongue every 5 (five)  minutes as needed for chest pain. 07/21/23   Ladona Heinz, MD  olopatadine (PATADAY) 0.1 % ophthalmic solution Place 1 drop into both eyes daily. 24 hr    [provider]  oxyCODONE  (OXY IR/ROXICODONE ) 5 MG immediate release tablet Take 5 mg by mouth daily as needed for severe pain.    [provider]  OXYGEN  Inhale 2.5 L into the lungs continuous.    [provider]  pantoprazole  (PROTONIX ) 40 MG tablet Take 1 tablet (40 mg total) by mouth daily at 12 noon. Patient taking differently: Take 40 mg by mouth 2 (two) times daily. 04/21/14   Ricky Fines, MD  Probiotic Product (ALIGN) 4 MG CAPS Take 4  mg by mouth daily.    [provider]  ranolazine  (RANEXA ) 500 MG 12 hr tablet TAKE ONE TABLET BY MOUTH TWICE DAILY 05/22/23   Ladona Heinz, MD  triamcinolone cream (KENALOG) 0.1 % Apply 1 Application topically daily. On both legs.With vitamin E oil    [provider]  VYTORIN  10-20 MG per tablet Take 1 tablet by mouth daily with supper. 09/18/11   [provider]  fluticasone (FLONASE) 50 MCG/ACT nasal spray Place 2 sprays into both nostrils daily. 04/04/19 09/12/19  [provider]    Allergies: Other, Dilaudid [hydromorphone hcl], Erythromycin, and Diflucan [fluconazole]    Review of Systems  Updated Vital Signs BP (!) 126/104   Pulse 64   Temp 98 F (36.7 C)   Resp 19   Ht 5' 3 (1.6 m)   Wt 85.3 kg   SpO2 98%   BMI 33.30 kg/m   Physical Exam Vitals and nursing note reviewed.  Constitutional:      General: She is not in acute distress.    Appearance: She is well-developed.  HENT:     Head: Normocephalic and atraumatic.     Right Ear: External ear normal.     Left Ear: External ear normal.     Nose: Nose normal.  Eyes:     Conjunctiva/sclera: Conjunctivae normal.  Cardiovascular:     Rate and Rhythm: Normal rate and regular rhythm.     Heart sounds: No murmur heard. Pulmonary:     Effort: No respiratory distress.     Breath sounds: No wheezing, rhonchi or rales.  Abdominal:     Palpations: Abdomen is soft.     Tenderness: There is generalized abdominal tenderness. There is no guarding or rebound. Negative signs include Murphy's sign and McBurney's sign.  Musculoskeletal:     Cervical back: Normal range of motion and neck supple.     Right lower leg: Edema present.     Left lower leg: Edema present.  Skin:    General: Skin is warm and dry.     Findings: No rash.  Neurological:     General: No focal deficit present.     Mental Status: She is alert. Mental status is at baseline.     Motor: No weakness.  Psychiatric:        Mood and  Affect: Mood normal.     (all labs ordered are listed, but only abnormal results are displayed) Labs Reviewed  COMPREHENSIVE METABOLIC PANEL WITH GFR - Abnormal; Notable for the following components:      Result Value   CO2 20 (*)    Glucose, Bld 123 (*)    Calcium 8.8 (*)    Total Protein 6.0 (*)    All other components within normal limits  CBC WITH DIFFERENTIAL/PLATELET -  Abnormal; Notable for the following components:   RBC 3.42 (*)    Hemoglobin 11.0 (*)    HCT 33.4 (*)    All other components within normal limits  I-STAT CHEM 8, ED - Abnormal; Notable for the following components:   Glucose, Bld 127 (*)    Calcium, Ion 1.08 (*)    All other components within normal limits  CBC WITH DIFFERENTIAL/PLATELET  LIPASE, BLOOD  URINALYSIS, ROUTINE W REFLEX MICROSCOPIC  BRAIN NATRIURETIC PEPTIDE  I-STAT CG4 LACTIC ACID, ED    EKG: EKG Interpretation Date/Time:  Thursday December 10 2023 14:34:03 EDT Ventricular Rate:  69 PR Interval:  188 QRS Duration:  96 QT Interval:  432 QTC Calculation: 462 R Axis:   62  Text Interpretation: Normal sinus rhythm Low voltage QRS Borderline ECG When compared with ECG of 21-Jul-2023 09:53, PREVIOUS ECG IS PRESENT Confirmed by Pamella Sharper [id number]) on 12/10/2023 5:34:10 PM  Radiology: CT Angio Abd/Pel W and/or Wo Contrast Result Date: 12/10/2023 EXAM: CTA ABDOMEN AND PELVIS WITHOUT AND WITH CONTRAST 12/10/2023 04:13:03 PM TECHNIQUE: CTA images of the abdomen and pelvis without and with intravenous contrast (75 mL iohexol  (OMNIPAQUE ) 350 MG/ML injection). Three-dimensional MIP/volume rendered formations were performed. Automated exposure control, iterative reconstruction, and/or weight based adjustment of the mA/kV was utilized to reduce the radiation dose to as low as reasonably achievable. COMPARISON: 11/24/2023, 06/23/2022 CLINICAL HISTORY: Worsening abd pain, generalized but worse on the left recently. Neg CT wo contrast 11/24/23. FINDINGS:  VASCULATURE: AORTA: Diffuse aortic atherosclerosis. No acute finding. No abdominal aortic aneurysm. No dissection. CELIAC TRUNK: No acute finding. No occlusion or significant stenosis. SUPERIOR MESENTERIC ARTERY: High-grade ostial stenosis at the origin of the SMA, with 90-99% stenosis. This is unchanged since prior studies dating to 2024. The more distal SMA is widely patent. RENAL ARTERIES: No acute finding. No occlusion or significant stenosis. ILIAC ARTERIES: No acute finding. No occlusion or significant stenosis. LIVER: The liver is unremarkable. GALLBLADDER AND BILE DUCTS: Gallbladder is unremarkable. No biliary ductal dilatation. SPLEEN: The spleen is unremarkable. PANCREAS: The pancreas is unremarkable. ADRENAL GLANDS: Bilateral adrenal glands demonstrate no acute abnormality. KIDNEYS, URETERS AND BLADDER: Bladder is decompressed, with nonspecific periventricular fat stranding. No stones in the kidneys or ureters. No hydronephrosis. No perinephric or periureteral stranding. GI AND BOWEL: Prior appendectomy. Stomach and duodenal sweep demonstrate no acute abnormality. There is no bowel obstruction. No abnormal bowel wall thickening or distension. REPRODUCTIVE: Reproductive organs are unremarkable. PERITONEUM AND RETRPERITONEUM: No ascites or free air. LUNG BASE: No acute abnormality. LYMPH NODES: No lymphadenopathy. BONES AND SOFT TISSUES: No acute abnormality of the bones. No acute soft tissue abnormality. IMPRESSION: 1. 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 2. Otherwise, no acute intra-abdominal or intrapelvic process. Electronically signed by: Sharper Daring MD 12/10/2023 04:49 PM EDT RP Workstation: [id number]   DG Chest Portable 1 View Result Date: 12/10/2023 EXAM: 1 VIEW(S) XRAY OF THE CHEST 12/10/2023 03:05:00 PM COMPARISON: 04/04/2023 CLINICAL HISTORY: CHF history, abdominal swelling. Per triage notes: States she has had side pain for the  past 2 years and over the past couple of days its now radiating up into her abdomen. She states it hurts worse with movement; Hx of CHF, CAD; Nonsmoker FINDINGS: LUNGS AND PLEURA: No focal pulmonary opacity. No pulmonary edema. No pleural effusion. No pneumothorax. HEART AND MEDIASTINUM: Stable cardiomegaly. Atherosclerotic aorta. BONES AND SOFT TISSUES: Degenerative changes throughout spine. IMPRESSION: 1. No acute abnormalities. Electronically signed by: Sharper  Delores MD 12/10/2023 03:28 PM EDT RP Workstation: [id number]     Procedures   Medications Ordered in the ED  sodium chloride  0.9 % bolus 1,000 mL (has no administration in time range)   ED Course  Patient seen and examined. History obtained directly from patient.   Labs/EKG: Ordered CBC, CMP, lipase ordered and pending.  I-STAT Chem-8 with creatinine of 0.70.   Imaging: Portable chest x-ray, agree negative.  Medications/Fluids: Ordered: None ordered.  Most recent vital signs reviewed and are as follows: BP (!) 126/104   Pulse 64   Temp 98 F (36.7 C)   Resp 19   Ht 5' 3 (1.6 m)   Wt 85.3 kg   SpO2 98%   BMI 33.30 kg/m   Initial impression: Abdominal pain.  At bedside I reviewed CT imaging performed on 9/16.  I agree no acute findings.  Patient updated on these results.  Will proceed with CT imaging today but will get CT angio with contrast at this time.  5:54 PM Reassessment performed. Patient appears stable. Patient discussed with and seen by Dr. Pamella. OK for dc with PCP follow-up.   Labs personally reviewed and interpreted including: CBC with normal white blood cell count and hemoglobin of 11, improved from baseline over the past year; CMP with normal electrolytes, glucose 123, creatinine 0.74 with a BUN of 15 today. UA not obtained, no current urinary symptoms or CT findings of pyelonephritis or cystitis.  Imaging personally visualized and interpreted including: CT angio of the abdomen shows chronic high-grade  stenosis of the SMA, patient will continue anticoagulation and follow-up with PCP.  Most current vital signs reviewed and are as follows: BP (!) 126/104   Pulse 64   Temp 98 F (36.7 C)   Resp 19   Ht 5' 3 (1.6 m)   Wt 85.3 kg   SpO2 98%   BMI 33.30 kg/m   Plan: Discharge to home.   Prescriptions written for: None  Other home care instructions discussed: avoid triggers, bland diet, use OTC meds.  ED return instructions discussed: The patient was urged to return to the Emergency Department immediately with worsening of current symptoms, worsening abdominal pain, persistent vomiting, blood noted in stools, fever, or any other concerns. The patient verbalized understanding.   Follow-up instructions discussed: Patient encouraged to follow-up with their PCP in 3 days.                                    Medical Decision Making Amount and/or Complexity of Data Reviewed Radiology: ordered.  Risk Prescription drug management.   For this patient's complaint of abdominal pain, the following conditions were considered on the differential diagnosis: gastritis/PUD, enteritis/duodenitis, appendicitis, cholelithiasis/cholecystitis, cholangitis, pancreatitis, ruptured viscus, colitis, diverticulitis, small/large bowel obstruction, proctitis, cystitis, pyelonephritis, ureteral colic, aortic dissection, aortic aneurysm. In women, pelvic inflammatory disease, ovarian cysts, and tubo-ovarian abscess were also considered. Atypical chest etiologies were also considered including ACS, PE, and pneumonia.  Workup here with relatively normal labs including CBC and CMP.  Lactate was normal.  There is stenosis SMA, likely chronic.  Patient is already anticoagulated.   The patient's vital signs, pertinent lab work and imaging were reviewed and interpreted as discussed in the ED course. Hospitalization was considered for further testing, treatments, or serial exams/observation. However as patient is  well-appearing, has a stable exam, and reassuring studies today, I do not feel that they warrant admission  at this time. This plan was discussed with the patient who verbalizes agreement and comfort with this plan and seems reliable and able to return to the Emergency Department with worsening or changing symptoms.        Final diagnoses:  Generalized abdominal pain    ED Discharge Orders     None          Desiderio Chew, PA-C 12/10/23 1801    Pamella Ozell LABOR, DO 12/17/23 1729

## 2023-12-10 NOTE — ED Triage Notes (Signed)
 States she has had side pain for the past 2 years and over the past couple of days its now radiating up into her abdomen. She states it hurts worse with movement.

## 2023-12-14 DIAGNOSIS — R911 Solitary pulmonary nodule: Secondary | ICD-10-CM | POA: Diagnosis not present

## 2023-12-14 DIAGNOSIS — N1832 Chronic kidney disease, stage 3b: Secondary | ICD-10-CM | POA: Diagnosis not present

## 2023-12-14 DIAGNOSIS — E1129 Type 2 diabetes mellitus with other diabetic kidney complication: Secondary | ICD-10-CM | POA: Diagnosis not present

## 2023-12-14 DIAGNOSIS — I130 Hypertensive heart and chronic kidney disease with heart failure and stage 1 through stage 4 chronic kidney disease, or unspecified chronic kidney disease: Secondary | ICD-10-CM | POA: Diagnosis not present

## 2023-12-14 DIAGNOSIS — N3289 Other specified disorders of bladder: Secondary | ICD-10-CM | POA: Diagnosis not present

## 2023-12-14 DIAGNOSIS — J449 Chronic obstructive pulmonary disease, unspecified: Secondary | ICD-10-CM | POA: Diagnosis not present

## 2023-12-14 DIAGNOSIS — K551 Chronic vascular disorders of intestine: Secondary | ICD-10-CM | POA: Diagnosis not present

## 2023-12-14 DIAGNOSIS — I5042 Chronic combined systolic (congestive) and diastolic (congestive) heart failure: Secondary | ICD-10-CM | POA: Diagnosis not present

## 2023-12-14 DIAGNOSIS — I25110 Atherosclerotic heart disease of native coronary artery with unstable angina pectoris: Secondary | ICD-10-CM | POA: Diagnosis not present

## 2023-12-15 DIAGNOSIS — J449 Chronic obstructive pulmonary disease, unspecified: Secondary | ICD-10-CM | POA: Diagnosis not present

## 2023-12-15 DIAGNOSIS — I2609 Other pulmonary embolism with acute cor pulmonale: Secondary | ICD-10-CM | POA: Diagnosis not present

## 2023-12-15 DIAGNOSIS — Z9981 Dependence on supplemental oxygen: Secondary | ICD-10-CM | POA: Diagnosis not present

## 2023-12-17 ENCOUNTER — Encounter: Payer: Self-pay | Admitting: Pulmonary Disease

## 2023-12-17 ENCOUNTER — Other Ambulatory Visit: Payer: Self-pay | Admitting: *Deleted

## 2023-12-17 ENCOUNTER — Ambulatory Visit: Admitting: Pulmonary Disease

## 2023-12-17 ENCOUNTER — Telehealth: Payer: Self-pay | Admitting: Pulmonary Disease

## 2023-12-17 VITALS — BP 134/76 | HR 73 | Ht 63.0 in | Wt 186.0 lb

## 2023-12-17 DIAGNOSIS — J9611 Chronic respiratory failure with hypoxia: Secondary | ICD-10-CM

## 2023-12-17 DIAGNOSIS — Z7901 Long term (current) use of anticoagulants: Secondary | ICD-10-CM

## 2023-12-17 DIAGNOSIS — Z9981 Dependence on supplemental oxygen: Secondary | ICD-10-CM | POA: Diagnosis not present

## 2023-12-17 DIAGNOSIS — R911 Solitary pulmonary nodule: Secondary | ICD-10-CM

## 2023-12-17 DIAGNOSIS — J4530 Mild persistent asthma, uncomplicated: Secondary | ICD-10-CM | POA: Diagnosis not present

## 2023-12-17 DIAGNOSIS — R0602 Shortness of breath: Secondary | ICD-10-CM

## 2023-12-17 DIAGNOSIS — I2699 Other pulmonary embolism without acute cor pulmonale: Secondary | ICD-10-CM | POA: Diagnosis not present

## 2023-12-17 DIAGNOSIS — K551 Chronic vascular disorders of intestine: Secondary | ICD-10-CM

## 2023-12-17 MED ORDER — ALBUTEROL SULFATE HFA 108 (90 BASE) MCG/ACT IN AERS: 1.0000 | INHALATION_SPRAY | Freq: Four times a day (QID) | RESPIRATORY_TRACT | 11 refills | Status: AC | PRN

## 2023-12-17 MED ORDER — BUDESONIDE-FORMOTEROL FUMARATE 160-4.5 MCG/ACT IN AERO: 2.0000 | INHALATION_SPRAY | Freq: Two times a day (BID) | RESPIRATORY_TRACT | 12 refills | Status: DC

## 2023-12-17 NOTE — Telephone Encounter (Signed)
 Per Tommye with [employer]- Patient must use 8 tanks a month for 3 consecutive months to qualify for a POC.  Please advise.

## 2023-12-17 NOTE — Progress Notes (Signed)
 Synopsis: Referred in February 2023 for shortness of breath by Sherran Berliner, PA  Subjective:   PATIENT ID: Desiree Leon Blush GENDER: female DOB: [birth year], MRN: [id number]  HPI  Chief Complaint  Patient presents with   Medical Management of Chronic Issues    Pt states they found two nodules on last scan    Desiree Leon is an 83 year old woman, former smoker with history of coronary artery disease, pulmonary emboli, hypothyroidism and mitral valve prolapse who returns to pulmonary clinic for chronic respiratory failure.   She has experienced worsening respiratory issues, requiring continuous oxygen  therapy since 2023 due to heart-related complications. Initially, oxygen  was needed only at night, but now it is required 24/7. Her lung function tests in 2023 showed a decreased diffusion capacity compared to 2015 but remained in normal range. She uses Symbicort , two puffs twice daily, and an albuterol  inhaler for nocturnal wheezing.  She has a history of pulmonary embolism, with two small clots followed by a larger one, and continues on Eliquis  2.5 mg. Lung imaging revealed nodules. She has a home oxygen  concentrator and uses travel tanks when outside. She experiences leg swelling and uses oxygen  at night.  Her medical history includes heart failure and a stroke, which has resulted in tremors.  OV 06/24/21 Patient was recently admitted 3/15 to 3/16 for acute pulmonary emboli of the left main pulmonary artery as she presented with left sided chest pain. She was started on eliquis  therapy. She was discharged with supplemental oxygen  during the day. She was previously using O2 at night. She is feeling much better since hospitalization. She reports intermittent cough and wheezing. She is using symbicort  80-4.77mcg 2 puffs twice daily. She does not like to use albuterol  as this makes her feel jittery.   She reports the spring allergies are bothering her. She is not taking allergy medicine.   She  had a PE 3 years ago and stopped anticoagulation 1 year ago. She denies weight loss or lack of appetite. She reports a 3lbs weight gain.   PFTs show normal spirometry and lung volumes with mild decrease in DLCO, but normalizes when corrected for hemoglobin level.   OV 04/25/21 Cardiology note 04/15/21 reviewed. Echo 03/21/21 showed LV EF 60-65% and improvement in mildly elevated PASP pressures from an echo in 2022.   PFTs from 2015 were within normal limits. She has symbicort  80-4.5mcg inhaler and is using it as needed. She does notice some benefit in her wheezing and cough when used.   She had covid 04/2020. She was admitted in 04/2019 for pneumonia and pulmonary emboli. She has completed eliquis  therapy for the PE.   She has shortness of breath when bathing herself and dressing herself. The shortness of breath has been on going since her myocardial infarction in 2016. She is using 2.5L of O2 at night since 2017. She is not very active due to back pain. She has seasonal allergies.  She lives alone with her jack russel dog name Desiree Leon. Her daughter lives next door. She quit smoking in 2007. She smoked half a pack for 30 years.    Past Medical History:  Diagnosis Date   Anxiety    Blood transfusion    CAP (community acquired pneumonia)    COPD (chronic obstructive pulmonary disease) (HCC)    Heart murmur    History of colon polyps    Hyperlipidemia    Hypertension    MI (myocardial infarction) (HCC)    Mitral valve prolapse  NSTEMI (non-ST elevated myocardial infarction) [facility])    Osteoarthritis    Osteoporosis    Pulmonary emboli (HCC) 04/12/2019   Spinal stenosis    Thyroid  disease    hypothyroidism   Unstable angina (HCC) 05/08/2014   Venous insufficiency      Family History  Problem Relation Age of Onset   Heart disease Mother    Heart disease Father    Cancer Sister        breast   Cancer Daughter        cervical and bone   Heart disease Sister    Thyroid  disease  Sister      Social History   Socioeconomic History   Marital status: Widowed    Spouse name: Not on file   Number of children: 3   Years of education: Not on file   Highest education level: Not on file  Occupational History   Not on file  Tobacco Use   Smoking status: Former    Current packs/day: 0.00    Average packs/day: 0.5 packs/day for 30.0 years (15.0 ttl pk-yrs)    Types: Cigarettes    Start date: 03/11/1975    Quit date: 03/10/2005    Years since quitting: 18.7   Smokeless tobacco: Never  Vaping Use   Vaping status: Never Used  Substance and Sexual Activity   Alcohol  use: No   Drug use: No   Sexual activity: Not on file  Other Topics Concern   Not on file  Social History Narrative   Not on file   Social Drivers of Health   Financial Resource Strain: Not on file  Food Insecurity: No Food Insecurity (04/05/2023)   Hunger Vital Sign    Worried About Running Out of Food in the Last Year: Never true    Ran Out of Food in the Last Year: Never true  Transportation Needs: No Transportation Needs (04/05/2023)   PRAPARE - Administrator, Civil Service (Medical): No    Lack of Transportation (Non-Medical): No  Physical Activity: Not on file  Stress: Not on file  Social Connections: Socially Isolated (04/05/2023)   Social Connection and Isolation Panel    Frequency of Communication with Friends and Family: More than three times a week    Frequency of Social Gatherings with Friends and Family: More than three times a week    Attends Religious Services: Never    Database administrator or Organizations: No    Attends Banker Meetings: Never    Marital Status: Widowed  Intimate Partner Violence: Not At Risk (04/05/2023)   Humiliation, Afraid, Rape, and Kick questionnaire    Fear of Current or Ex-Partner: No    Emotionally Abused: No    Physically Abused: No    Sexually Abused: No     Allergies  Allergen Reactions   Other Anaphylaxis and Cough     fragarances-perfumes   Dilaudid [Hydromorphone Hcl] Nausea Only   Erythromycin Other (See Comments)   Diflucan [Fluconazole] Palpitations and Hypertension    When taken with Nicardipine , reaction happens.     Outpatient Medications Prior to Visit  Medication Sig Dispense Refill   acetaminophen  (TYLENOL ) 500 MG tablet Take 500 mg by mouth daily as needed for fever or headache.     ALPRAZolam  (XANAX ) 0.25 MG tablet Take 0.25 mg by mouth daily as needed for anxiety.     apixaban  (ELIQUIS ) 2.5 MG TABS tablet Take 1 tablet (2.5 mg total) by mouth 2 (two)  times daily. Resume on 4/8 evening 180 tablet 3   cholecalciferol  (VITAMIN D ) 25 MCG (1000 UNIT) tablet Take 2,000 Units by mouth daily.     COLLAGEN PO Take 10 g by mouth daily.     escitalopram  (LEXAPRO ) 5 MG tablet Take 5 mg by mouth every evening.      ezetimibe  (ZETIA ) 10 MG tablet 1 tablet Orally Once a day; Duration: 90 days     furosemide  (LASIX ) 20 MG tablet Take 20 mg by mouth daily as needed for fluid or edema.     glucose blood test strip Accu-Chek SmartView Test Strips  CHECK BLOOD SUGAR TWICE DAILY.     levothyroxine  (SYNTHROID ) 125 MCG tablet Take 125 mcg by mouth daily.     methocarbamol (ROBAXIN) 500 MG tablet Take 250-500 mg by mouth daily as needed for muscle spasms.     metoprolol  tartrate (LOPRESSOR ) 25 MG tablet Take 0.5 tablets (12.5 mg total) by mouth 2 (two) times daily. 90 tablet 3   multivitamin-iron -minerals-folic acid (CENTRUM) chewable tablet Chew 1 tablet by mouth daily.     niCARdipine  (CARDENE ) 20 MG capsule Take 20 mg by mouth at bedtime.     nitroGLYCERIN  (NITRODUR - DOSED IN MG/24 HR) 0.4 mg/hr patch Place 0.4 mg onto the skin daily.     nitroGLYCERIN  (NITROSTAT ) 0.4 MG SL tablet Place 1 tablet (0.4 mg total) under the tongue every 5 (five) minutes as needed for chest pain. 25 tablet 11   olopatadine (PATADAY) 0.1 % ophthalmic solution Place 1 drop into both eyes daily. 24 hr     oxyCODONE  (OXY  IR/ROXICODONE ) 5 MG immediate release tablet Take 5 mg by mouth daily as needed for severe pain.     OXYGEN  Inhale 2.5 L into the lungs continuous.     pantoprazole  (PROTONIX ) 40 MG tablet Take 1 tablet (40 mg total) by mouth daily at 12 noon. (Patient taking differently: Take 40 mg by mouth 2 (two) times daily.) 30 tablet 1   Probiotic Product (ALIGN) 4 MG CAPS Take 4 mg by mouth daily.     ranolazine  (RANEXA ) 500 MG 12 hr tablet TAKE ONE TABLET BY MOUTH TWICE DAILY 180 tablet 3   triamcinolone cream (KENALOG) 0.1 % Apply 1 Application topically daily. On both legs.With vitamin E oil     VYTORIN  10-20 MG per tablet Take 1 tablet by mouth daily with supper.     albuterol  (VENTOLIN  HFA) 108 (90 Base) MCG/ACT inhaler Inhale 1-2 puffs into the lungs every 6 (six) hours as needed for wheezing or shortness of breath.     budesonide -formoterol  (SYMBICORT ) 160-4.5 MCG/ACT inhaler Inhale 2 puffs into the lungs 2 (two) times daily. 1 each 12   No facility-administered medications prior to visit.   Review of Systems  Constitutional:  Negative for chills, fever, malaise/fatigue and weight loss.  HENT:  Negative for congestion, sinus pain and sore throat.   Eyes: Negative.   Respiratory:  Positive for cough and shortness of breath. Negative for hemoptysis, sputum production and wheezing.   Cardiovascular:  Negative for chest pain, palpitations, orthopnea, claudication and leg swelling.  Gastrointestinal:  Negative for abdominal pain, heartburn, nausea and vomiting.  Genitourinary: Negative.   Musculoskeletal:  Negative for joint pain and myalgias.  Skin:  Negative for rash.  Neurological:  Negative for weakness.  Endo/Heme/Allergies: Negative.   Psychiatric/Behavioral: Negative.      Objective:   Vitals:   12/17/23 0852  BP: 134/76  Pulse: 73  SpO2: 98%  Weight: 186 lb (84.4 kg)  Height: 5' 3 (1.6 m)  PF: (!) 3 L/min   Physical Exam Constitutional:      General: She is not in acute  distress.    Appearance: She is not ill-appearing.  HENT:     Head: Normocephalic and atraumatic.  Eyes:     General: No scleral icterus.    Conjunctiva/sclera: Conjunctivae normal.  Cardiovascular:     Rate and Rhythm: Normal rate and regular rhythm.     Pulses: Normal pulses.     Heart sounds: Normal heart sounds. No murmur heard. Pulmonary:     Effort: Pulmonary effort is normal.     Breath sounds: Normal breath sounds. No wheezing, rhonchi or rales.  Musculoskeletal:     Right lower leg: No edema.     Left lower leg: No edema.  Lymphadenopathy:     Cervical: No cervical adenopathy.  Skin:    General: Skin is warm and dry.  Neurological:     Mental Status: She is alert.    CBC    Component Value Date/Time   WBC 8.2 12/10/2023 1546   RBC 3.42 (L) 12/10/2023 1546   HGB 11.0 (L) 12/10/2023 1546   HGB 8.7 (L) 06/11/2023 1448   HCT 33.4 (L) 12/10/2023 1546   HCT 28.8 (L) 06/11/2023 1448   PLT 228 12/10/2023 1546   PLT 322 06/11/2023 1448   MCV 97.7 12/10/2023 1546   MCV 82 06/11/2023 1448   MCH 32.2 12/10/2023 1546   MCHC 32.9 12/10/2023 1546   RDW 13.8 12/10/2023 1546   RDW 17.3 (H) 06/11/2023 1448   LYMPHSABS 2.7 12/10/2023 1546   MONOABS 0.7 12/10/2023 1546   EOSABS 0.1 12/10/2023 1546   BASOSABS 0.0 12/10/2023 1546      Latest Ref Rng & Units 12/10/2023    2:52 PM 12/10/2023    2:19 PM 06/11/2023    2:48 PM  BMP  Glucose 70 - 99 mg/dL 872  876  864   BUN 8 - 23 mg/dL 18  15  12    Creatinine 0.44 - 1.00 mg/dL 9.29  9.25  9.13   BUN/Creat Ratio 12 - 28   14   Sodium 135 - 145 mmol/L 140  139  140   Potassium 3.5 - 5.1 mmol/L 4.0  4.0  4.2   Chloride 98 - 111 mmol/L 106  106  102   CO2 22 - 32 mmol/L  20  23   Calcium 8.9 - 10.3 mg/dL  8.8  9.0    Chest imaging: CT Abd 11/24/23 1. No acute findings in the abdomen or pelvis. Specifically, no findings to explain the patient's history of abdominal pain and bloating. 2. 2 mm right middle lobe pulmonary  nodule. No follow-up needed if patient is low-risk.This recommendation follows the consensus statement: Guidelines for Management of Incidental Pulmonary Nodules Detected on CT Images: From the Fleischner Society 2017; Radiology 2017; 284:228-243. 3.  Aortic Atherosclerosis (ICD10-I70.0).  CTA Chest 05/21/21 1. Positive examination for acute pulmonary embolus involving the distal left main pulmonary artery and extending into the left lingular branches. No evidence of right heart strain. 2. Patchy airspace disease in the lungs, probably edema although pneumonia could also have this appearance. Small left pleural effusion with basilar atelectasis.  CXR 05/01/20 Mild atelectasis, no consolidation or effusion.  CTA PE 04/11/19 Cardiovascular: There are 2 small peripheral pulmonary emboli in the right lower lobe. No other pulmonary emboli. Aortic atherosclerosis. RV LV ratio is normal.  Mild cardiomegaly. No pericardial effusion.  Mediastinum/Nodes: No hilar or mediastinal adenopathy. Esophagus is normal. Thyroid  gland is not identified.   Lungs/Pleura: No infiltrates or effusions. There is several small blebs in the right lower lobe. Minimal atelectasis in the lingula.  PFT:    Latest Ref Rng & Units 06/24/2021    8:52 AM 06/09/2013   12:10 PM  PFT Results  FVC-Pre L 1.98  2.30   FVC-Predicted Pre % 77  80   FVC-Post L 2.17  2.60   FVC-Predicted Post % 84  90   Pre FEV1/FVC % % 83  84   Post FEV1/FCV % % 84  86   FEV1-Pre L 1.64  1.93   FEV1-Predicted Pre % 85  89   FEV1-Post L 1.82  2.23   DLCO uncorrected ml/min/mmHg 13.75  22.12   DLCO UNC% % 73  91   DLCO corrected ml/min/mmHg 14.81  22.12   DLCO COR %Predicted % 79  91   DLVA Predicted % 105  121   TLC L  4.25   TLC % Predicted %  84   RV % Predicted %  90   PFT 2015 within normal limits.  Labs:  Path:  Echo 03/21/21: LV EF 60-65% and improvement in mildly elevated PASP pressures from an echo in 2022.   Heart  Catheterization:   Assessment & Plan:   Chronic hypoxemic respiratory failure (HCC) - Plan: Ambulatory Referral for DME  Mild persistent asthma without complication - Plan: budesonide -formoterol  (SYMBICORT ) 160-4.5 MCG/ACT inhaler, albuterol  (VENTOLIN  HFA) 108 (90 Base) MCG/ACT inhaler  Shortness of breath  Lung nodule - Plan: CT Chest Wo Contrast  Discussion: Aaminah Forrester is an 83 year old woman, former smoker with history of coronary artery disease, pulmonary emboli, hypothyroidism and mitral valve prolapse who returns to pulmonary clinic for shortness of breath.   Recurrent pulmonary embolism Recurrent pulmonary embolism with previous episodes in 2021 and 2023. Currently on Eliquis  2.5 mg. - Continue Eliquis  2.5 mg as prescribed.  Chronic hypoxemia requiring supplemental oxygen  Chronic hypoxemia with normal lung function but decreased diffusion capacity. Suspected pulmonary hypertension due to chronic blood clots. - Perform a walk test to assess oxygen  requirement. - Schedule repeat breathing tests to evaluate lung function and potential pulmonary hypertension. - Consider qualification for a portable oxygen  concentrator.  Moderate Persistent Asthma - Continue Symbicort  two puffs twice a day. - Use albuterol  inhaler as needed, especially if wheezing occurs before the next Symbicort  dose. - Send in fresh refills for albuterol  inhaler.  Pulmonary nodule, right lung Pulmonary nodule identified in the right lung on a CT scan in September but not in October. - Order a dedicated CT scan of the lungs in January to assess the pulmonary nodule.  Follow up in 3 months  Dorn Chill, MD [facility] Office: [phone]   Current Outpatient Medications:    acetaminophen  (TYLENOL ) 500 MG tablet, Take 500 mg by mouth daily as needed for fever or headache., Disp: , Rfl:    ALPRAZolam  (XANAX ) 0.25 MG tablet, Take 0.25 mg by mouth daily as needed for anxiety.,  Disp: , Rfl:    apixaban  (ELIQUIS ) 2.5 MG TABS tablet, Take 1 tablet (2.5 mg total) by mouth 2 (two) times daily. Resume on 4/8 evening, Disp: 180 tablet, Rfl: 3   cholecalciferol  (VITAMIN D ) 25 MCG (1000 UNIT) tablet, Take 2,000 Units by mouth daily., Disp: , Rfl:    COLLAGEN PO, Take 10 g by mouth daily., Disp: ,  Rfl:    escitalopram  (LEXAPRO ) 5 MG tablet, Take 5 mg by mouth every evening. , Disp: , Rfl:    ezetimibe  (ZETIA ) 10 MG tablet, 1 tablet Orally Once a day; Duration: 90 days, Disp: , Rfl:    furosemide  (LASIX ) 20 MG tablet, Take 20 mg by mouth daily as needed for fluid or edema., Disp: , Rfl:    glucose blood test strip, Accu-Chek SmartView Test Strips  CHECK BLOOD SUGAR TWICE DAILY., Disp: , Rfl:    levothyroxine  (SYNTHROID ) 125 MCG tablet, Take 125 mcg by mouth daily., Disp: , Rfl:    methocarbamol (ROBAXIN) 500 MG tablet, Take 250-500 mg by mouth daily as needed for muscle spasms., Disp: , Rfl:    metoprolol  tartrate (LOPRESSOR ) 25 MG tablet, Take 0.5 tablets (12.5 mg total) by mouth 2 (two) times daily., Disp: 90 tablet, Rfl: 3   multivitamin-iron -minerals-folic acid (CENTRUM) chewable tablet, Chew 1 tablet by mouth daily., Disp: , Rfl:    niCARdipine  (CARDENE ) 20 MG capsule, Take 20 mg by mouth at bedtime., Disp: , Rfl:    nitroGLYCERIN  (NITRODUR - DOSED IN MG/24 HR) 0.4 mg/hr patch, Place 0.4 mg onto the skin daily., Disp: , Rfl:    nitroGLYCERIN  (NITROSTAT ) 0.4 MG SL tablet, Place 1 tablet (0.4 mg total) under the tongue every 5 (five) minutes as needed for chest pain., Disp: 25 tablet, Rfl: 11   olopatadine (PATADAY) 0.1 % ophthalmic solution, Place 1 drop into both eyes daily. 24 hr, Disp: , Rfl:    oxyCODONE  (OXY IR/ROXICODONE ) 5 MG immediate release tablet, Take 5 mg by mouth daily as needed for severe pain., Disp: , Rfl:    OXYGEN , Inhale 2.5 L into the lungs continuous., Disp: , Rfl:    pantoprazole  (PROTONIX ) 40 MG tablet, Take 1 tablet (40 mg total) by mouth daily at 12  noon. (Patient taking differently: Take 40 mg by mouth 2 (two) times daily.), Disp: 30 tablet, Rfl: 1   Probiotic Product (ALIGN) 4 MG CAPS, Take 4 mg by mouth daily., Disp: , Rfl:    ranolazine  (RANEXA ) 500 MG 12 hr tablet, TAKE ONE TABLET BY MOUTH TWICE DAILY, Disp: 180 tablet, Rfl: 3   triamcinolone cream (KENALOG) 0.1 %, Apply 1 Application topically daily. On both legs.With vitamin E oil, Disp: , Rfl:    VYTORIN  10-20 MG per tablet, Take 1 tablet by mouth daily with supper., Disp: , Rfl:    albuterol  (VENTOLIN  HFA) 108 (90 Base) MCG/ACT inhaler, Inhale 1-2 puffs into the lungs every 6 (six) hours as needed for wheezing or shortness of breath., Disp: 8 g, Rfl: 11   budesonide -formoterol  (SYMBICORT ) 160-4.5 MCG/ACT inhaler, Inhale 2 puffs into the lungs 2 (two) times daily., Disp: 1 each, Rfl: 12

## 2023-12-17 NOTE — Patient Instructions (Addendum)
 Continue symbicort  inhaler 2 puffs twice daily - rinse mouth out after each use  Use albuterol  inhaler 1-2 puffs every 4-6 hours as needed  Schedule pulmonary function tests at the front desk   We will walk you today to qualify you for a portable oxygen  concentrator  Continue 2L of oxygen  at night with sleep  Follow up CT Chest scan in early January  Follow up in 3 months

## 2023-12-18 ENCOUNTER — Ambulatory Visit (HOSPITAL_COMMUNITY)
Admission: RE | Admit: 2023-12-18 | Discharge: 2023-12-18 | Disposition: A | Discharge status: Home / Self Care | Source: Ambulatory Visit | Attending: Vascular Surgery | Admitting: Vascular Surgery

## 2023-12-18 ENCOUNTER — Other Ambulatory Visit: Payer: Self-pay

## 2023-12-18 DIAGNOSIS — J9611 Chronic respiratory failure with hypoxia: Secondary | ICD-10-CM

## 2023-12-18 DIAGNOSIS — K551 Chronic vascular disorders of intestine: Secondary | ICD-10-CM | POA: Insufficient documentation

## 2023-12-18 NOTE — Telephone Encounter (Signed)
 2 x VM/LM - okay per DPR w/ daughter to return call

## 2023-12-18 NOTE — Telephone Encounter (Signed)
 Copied from CRM #[id number]. Topic: Clinical - Order For Equipment >> Dec 18, 2023  9:45 AM [name] wrote: Reason for CRM:   Pt's daughter Desiree Leon returning call from [place], regarding order for POC through [facility]. Contacted [facility], and spoke with Thersia who attempted to contact [facility]. Was not avail, advised to send CRM.  [name] requested call back. Is having issues [employer] and would like to see if there is an alternate DME company that could be used.   CB#  [phone]  Routing to [employer]

## 2023-12-18 NOTE — Telephone Encounter (Signed)
 Spoke w/ Pt & daughter per Patient would like to switch DME they been w/ [facility] for sometime now and have had issues with getting her O2 - Placed new order to [facility].

## 2023-12-18 NOTE — Telephone Encounter (Signed)
 VM /LM- x1   Reach out to patient daughter   Waiting on a return call.

## 2023-12-21 ENCOUNTER — Encounter: Payer: Self-pay | Admitting: Surgery

## 2023-12-21 ENCOUNTER — Ambulatory Visit: Attending: Surgery | Admitting: Surgery

## 2023-12-21 ENCOUNTER — Other Ambulatory Visit: Payer: Self-pay

## 2023-12-21 VITALS — BP 108/66 | HR 66 | Temp 97.7°F | Ht 63.0 in | Wt 186.0 lb

## 2023-12-21 DIAGNOSIS — K551 Chronic vascular disorders of intestine: Secondary | ICD-10-CM | POA: Diagnosis not present

## 2023-12-21 NOTE — Progress Notes (Signed)
 [facility]ist of   Patient name: Desiree Leon MRN: [id number] DOB: 06-07-1940 Sex: female   REQUESTING PROVIDER:    Manuelita Ruth   REASON FOR CONSULT:    Mesenteric stenosis  HISTORY OF PRESENT ILLNESS:   Desiree Leon is a 83 y.o. female, who is referred for SMA stenosis which was detected on a CT scan.  The patient has been having abdominal pain for 2 years which is progressively getting worse.  She will occasionally have postprandial abdominal pain.  She has not had any significant weight loss.  She suffers from COPD and is on oxygen .  She is medically managed for hypertension.  She is on a statin for hypercholesterolemia.  She is on anticoagulation.  She has a history of coronary artery disease but has not had a stent.  PAST MEDICAL HISTORY    Past Medical History:  Diagnosis Date   Anxiety    Blood transfusion    CAP (community acquired pneumonia)    COPD (chronic obstructive pulmonary disease) (HCC)    Heart murmur    History of colon polyps    Hyperlipidemia    Hypertension    MI (myocardial infarction) (HCC)    Mitral valve prolapse    NSTEMI (non-ST elevated myocardial infarction) (HCC)    Osteoarthritis    Osteoporosis    Pulmonary emboli (HCC) 04/12/2019   Spinal stenosis    Thyroid  disease    hypothyroidism   Unstable angina (HCC) 05/08/2014   Venous insufficiency      FAMILY HISTORY   Family History  Problem Relation Age of Onset   Heart disease Mother    Heart disease Father    Cancer Sister        breast   Cancer Daughter        cervical and bone   Heart disease Sister    Thyroid  disease Sister     SOCIAL HISTORY:   Social History   Socioeconomic History   Marital status: Widowed    Spouse name: Not on file   Number of children: 3   Years of education: Not on file   Highest education level: Not on file  Occupational History   Not on file  Tobacco Use   Smoking status:  Former    Current packs/day: 0.00    Average packs/day: 0.5 packs/day for 30.0 years (15.0 ttl pk-yrs)    Types: Cigarettes    Start date: 03/11/1975    Quit date: 03/10/2005    Years since quitting: 18.7   Smokeless tobacco: Never  Vaping Use   Vaping status: Never Used  Substance and Sexual Activity   Alcohol  use: No   Drug use: No   Sexual activity: Not on file  Other Topics Concern   Not on file  Social History Narrative   Not on file   Social Drivers of Health   Financial Resource Strain: Not on file  Food Insecurity: No Food Insecurity (04/05/2023)   Hunger Vital Sign    Worried About Running Out of Food in the Last Year: Never true    Ran Out of Food in the Last Year: Never true  Transportation Needs: No Transportation Needs (04/05/2023)   PRAPARE - Administrator, Civil Service (Medical): No    Lack of Transportation (Non-Medical): No  Physical Activity: Not on file  Stress: Not on file  Social Connections: Socially Isolated (04/05/2023)   Social Connection and Isolation Panel    Frequency of  Communication with Friends and Family: More than three times a week    Frequency of Social Gatherings with Friends and Family: More than three times a week    Attends Religious Services: Never    Database administrator or Organizations: No    Attends Banker Meetings: Never    Marital Status: Widowed  Intimate Partner Violence: Not At Risk (04/05/2023)   Humiliation, Afraid, Rape, and Kick questionnaire    Fear of Current or Ex-Partner: No    Emotionally Abused: No    Physically Abused: No    Sexually Abused: No    ALLERGIES:    Allergies  Allergen Reactions   Other Anaphylaxis and Cough    fragarances-perfumes   Dilaudid [Hydromorphone Hcl] Nausea Only   Erythromycin Other (See Comments)   Diflucan [Fluconazole] Palpitations and Hypertension    When taken with Nicardipine , reaction happens.    CURRENT MEDICATIONS:    Current Outpatient  Medications  Medication Sig Dispense Refill   acetaminophen  (TYLENOL ) 500 MG tablet Take 500 mg by mouth daily as needed for fever or headache.     albuterol  (VENTOLIN  HFA) 108 (90 Base) MCG/ACT inhaler Inhale 1-2 puffs into the lungs every 6 (six) hours as needed for wheezing or shortness of breath. 8 g 11   ALPRAZolam  (XANAX ) 0.25 MG tablet Take 0.25 mg by mouth daily as needed for anxiety.     apixaban  (ELIQUIS ) 2.5 MG TABS tablet Take 1 tablet (2.5 mg total) by mouth 2 (two) times daily. Resume on 4/8 evening 180 tablet 3   budesonide -formoterol  (SYMBICORT ) 160-4.5 MCG/ACT inhaler Inhale 2 puffs into the lungs 2 (two) times daily. 1 each 12   cholecalciferol  (VITAMIN D ) 25 MCG (1000 UNIT) tablet Take 2,000 Units by mouth daily.     COLLAGEN PO Take 10 g by mouth daily.     escitalopram  (LEXAPRO ) 5 MG tablet Take 5 mg by mouth every evening.      ezetimibe  (ZETIA ) 10 MG tablet Take 10 mg by mouth daily.     furosemide  (LASIX ) 20 MG tablet Take 20 mg by mouth daily as needed for fluid or edema.     glucose blood test strip Accu-Chek SmartView Test Strips  CHECK BLOOD SUGAR TWICE DAILY.     Levothyroxine  Sodium 137 MCG CAPS 137 mcg.     methocarbamol (ROBAXIN) 500 MG tablet Take 250-500 mg by mouth daily as needed for muscle spasms.     metoprolol  tartrate (LOPRESSOR ) 25 MG tablet Take 0.5 tablets (12.5 mg total) by mouth 2 (two) times daily. 90 tablet 3   multivitamin-iron -minerals-folic acid (CENTRUM) chewable tablet Chew 1 tablet by mouth daily.     niCARdipine  (CARDENE ) 20 MG capsule Take 20 mg by mouth at bedtime.     nitroGLYCERIN  (NITRODUR - DOSED IN MG/24 HR) 0.4 mg/hr patch Place 0.4 mg onto the skin daily.     nitroGLYCERIN  (NITROSTAT ) 0.4 MG SL tablet Place 1 tablet (0.4 mg total) under the tongue every 5 (five) minutes as needed for chest pain. 25 tablet 11   olopatadine (PATADAY) 0.1 % ophthalmic solution Place 1 drop into both eyes daily. 24 hr     oxyCODONE  (OXY IR/ROXICODONE ) 5  MG immediate release tablet Take 5 mg by mouth daily as needed for severe pain.     OXYGEN  Inhale 2.5 L into the lungs continuous.     pantoprazole  (PROTONIX ) 40 MG tablet Take 1 tablet (40 mg total) by mouth daily at 12 noon. (Patient taking  differently: Take 40 mg by mouth 2 (two) times daily.) 30 tablet 1   Probiotic Product (ALIGN) 4 MG CAPS Take 4 mg by mouth daily.     ranolazine  (RANEXA ) 500 MG 12 hr tablet TAKE ONE TABLET BY MOUTH TWICE DAILY 180 tablet 3   rosuvastatin (CRESTOR) 40 MG tablet Take 40 mg by mouth daily.     triamcinolone cream (KENALOG) 0.1 % Apply 1 Application topically daily. On both legs.With vitamin E oil     ezetimibe  (ZETIA ) 10 MG tablet Take 10 mg by mouth daily.     No current facility-administered medications for this visit.    REVIEW OF SYSTEMS:   [X]  denotes positive finding, [ ]  denotes negative finding Cardiac  Comments:  Chest pain or chest pressure:    Shortness of breath upon exertion:    Short of breath when lying flat:    Irregular heart rhythm:        Vascular    Pain in calf, thigh, or hip brought on by ambulation:    Pain in feet at night that wakes you up from your sleep:     Blood clot in your veins:    Leg swelling:         Pulmonary    Oxygen  at home:    Productive cough:     Wheezing:         Neurologic    Sudden weakness in arms or legs:     Sudden numbness in arms or legs:     Sudden onset of difficulty speaking or slurred speech:    Temporary loss of vision in one eye:     Problems with dizziness:         Gastrointestinal    Blood in stool:      Vomited blood:         Genitourinary    Burning when urinating:     Blood in urine:        Psychiatric    Major depression:         Hematologic    Bleeding problems:    Problems with blood clotting too easily:        Skin    Rashes or ulcers:        Constitutional    Fever or chills:     PHYSICAL EXAM:   Vitals:   12/21/23 0946  BP: 108/66  Pulse: 66   Temp: 97.7 F (36.5 C)  SpO2: 96%  Weight: 186 lb (84.4 kg)  Height: 5' 3 (1.6 m)    GENERAL: The patient is a well-nourished female, in no acute distress. The vital signs are documented above. CARDIAC: There is a regular rate and rhythm.  PULMONARY: Nonlabored respirations ABDOMEN: Soft and mildly tender to palpation MUSCULOSKELETAL: There are no major deformities or cyanosis. NEUROLOGIC: No focal weakness or paresthesias are detected. SKIN: There are no ulcers or rashes noted. PSYCHIATRIC: The patient has a normal affect.  STUDIES:   I have reviewed the following: CT: 1. 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 2. Otherwise, no acute intra-abdominal or intrapelvic process.  Duplex: Mesenteric:    No significant celiac or SMA stenosis identified.  No flow detected at the origin of the SMA. Distal SMA waveforms are  abnormal and  suggest proximal obstruction.   ASSESSMENT and PLAN   SMA stenosis: On CT scan, her SMA has about a greater than 95% stenosis.  While her symptoms  are not classic for mesenteric ischemia, I suspect that if her SMA is not treated that it would go on to be 100% occluded and she may develop symptoms.  In addition, because she does have some postprandial abdominal pain, this may alleviate some of her abdominal pain.  I would have her evaluated by GI.  She had a colonoscopy 2 years ago but probably needs repeat imaging.  I will schedule her angiogram in the next week or so.  Most likely outcome from the groin.  She will need to be off of her Eliquis .   Malvina Serene CLORE, MD, FACS [facility] [phone] Pager [phone]

## 2023-12-21 NOTE — H&P (View-Only) (Signed)
 Vascular and Vein Specialist of   Patient name: Desiree Leon MRN: [id number] DOB: 06-07-1940 Sex: female   REQUESTING PROVIDER:    Manuelita Ruth   REASON FOR CONSULT:    Mesenteric stenosis  HISTORY OF PRESENT ILLNESS:   Desiree Leon is a 83 y.o. female, who is referred for SMA stenosis which was detected on a CT scan.  The patient has been having abdominal pain for 2 years which is progressively getting worse.  She will occasionally have postprandial abdominal pain.  She has not had any significant weight loss.  She suffers from COPD and is on oxygen .  She is medically managed for hypertension.  She is on a statin for hypercholesterolemia.  She is on anticoagulation.  She has a history of coronary artery disease but has not had a stent.  PAST MEDICAL HISTORY    Past Medical History:  Diagnosis Date   Anxiety    Blood transfusion    CAP (community acquired pneumonia)    COPD (chronic obstructive pulmonary disease) (HCC)    Heart murmur    History of colon polyps    Hyperlipidemia    Hypertension    MI (myocardial infarction) (HCC)    Mitral valve prolapse    NSTEMI (non-ST elevated myocardial infarction) (HCC)    Osteoarthritis    Osteoporosis    Pulmonary emboli (HCC) 04/12/2019   Spinal stenosis    Thyroid  disease    hypothyroidism   Unstable angina (HCC) 05/08/2014   Venous insufficiency      FAMILY HISTORY   Family History  Problem Relation Age of Onset   Heart disease Mother    Heart disease Father    Cancer Sister        breast   Cancer Daughter        cervical and bone   Heart disease Sister    Thyroid  disease Sister     SOCIAL HISTORY:   Social History   Socioeconomic History   Marital status: Widowed    Spouse name: Not on file   Number of children: 3   Years of education: Not on file   Highest education level: Not on file  Occupational History   Not on file  Tobacco Use   Smoking status:  Former    Current packs/day: 0.00    Average packs/day: 0.5 packs/day for 30.0 years (15.0 ttl pk-yrs)    Types: Cigarettes    Start date: 03/11/1975    Quit date: 03/10/2005    Years since quitting: 18.7   Smokeless tobacco: Never  Vaping Use   Vaping status: Never Used  Substance and Sexual Activity   Alcohol  use: No   Drug use: No   Sexual activity: Not on file  Other Topics Concern   Not on file  Social History Narrative   Not on file   Social Drivers of Health   Financial Resource Strain: Not on file  Food Insecurity: No Food Insecurity (04/05/2023)   Hunger Vital Sign    Worried About Running Out of Food in the Last Year: Never true    Ran Out of Food in the Last Year: Never true  Transportation Needs: No Transportation Needs (04/05/2023)   PRAPARE - Administrator, Civil Service (Medical): No    Lack of Transportation (Non-Medical): No  Physical Activity: Not on file  Stress: Not on file  Social Connections: Socially Isolated (04/05/2023)   Social Connection and Isolation Panel    Frequency of  Communication with Friends and Family: More than three times a week    Frequency of Social Gatherings with Friends and Family: More than three times a week    Attends Religious Services: Never    Database administrator or Organizations: No    Attends Banker Meetings: Never    Marital Status: Widowed  Intimate Partner Violence: Not At Risk (04/05/2023)   Humiliation, Afraid, Rape, and Kick questionnaire    Fear of Current or Ex-Partner: No    Emotionally Abused: No    Physically Abused: No    Sexually Abused: No    ALLERGIES:    Allergies  Allergen Reactions   Other Anaphylaxis and Cough    fragarances-perfumes   Dilaudid [Hydromorphone Hcl] Nausea Only   Erythromycin Other (See Comments)   Diflucan [Fluconazole] Palpitations and Hypertension    When taken with Nicardipine , reaction happens.    CURRENT MEDICATIONS:    Current Outpatient  Medications  Medication Sig Dispense Refill   acetaminophen  (TYLENOL ) 500 MG tablet Take 500 mg by mouth daily as needed for fever or headache.     albuterol  (VENTOLIN  HFA) 108 (90 Base) MCG/ACT inhaler Inhale 1-2 puffs into the lungs every 6 (six) hours as needed for wheezing or shortness of breath. 8 g 11   ALPRAZolam  (XANAX ) 0.25 MG tablet Take 0.25 mg by mouth daily as needed for anxiety.     apixaban  (ELIQUIS ) 2.5 MG TABS tablet Take 1 tablet (2.5 mg total) by mouth 2 (two) times daily. Resume on 4/8 evening 180 tablet 3   budesonide -formoterol  (SYMBICORT ) 160-4.5 MCG/ACT inhaler Inhale 2 puffs into the lungs 2 (two) times daily. 1 each 12   cholecalciferol  (VITAMIN D ) 25 MCG (1000 UNIT) tablet Take 2,000 Units by mouth daily.     COLLAGEN PO Take 10 g by mouth daily.     escitalopram  (LEXAPRO ) 5 MG tablet Take 5 mg by mouth every evening.      ezetimibe  (ZETIA ) 10 MG tablet Take 10 mg by mouth daily.     furosemide  (LASIX ) 20 MG tablet Take 20 mg by mouth daily as needed for fluid or edema.     glucose blood test strip Accu-Chek SmartView Test Strips  CHECK BLOOD SUGAR TWICE DAILY.     Levothyroxine  Sodium 137 MCG CAPS 137 mcg.     methocarbamol (ROBAXIN) 500 MG tablet Take 250-500 mg by mouth daily as needed for muscle spasms.     metoprolol  tartrate (LOPRESSOR ) 25 MG tablet Take 0.5 tablets (12.5 mg total) by mouth 2 (two) times daily. 90 tablet 3   multivitamin-iron -minerals-folic acid (CENTRUM) chewable tablet Chew 1 tablet by mouth daily.     niCARdipine  (CARDENE ) 20 MG capsule Take 20 mg by mouth at bedtime.     nitroGLYCERIN  (NITRODUR - DOSED IN MG/24 HR) 0.4 mg/hr patch Place 0.4 mg onto the skin daily.     nitroGLYCERIN  (NITROSTAT ) 0.4 MG SL tablet Place 1 tablet (0.4 mg total) under the tongue every 5 (five) minutes as needed for chest pain. 25 tablet 11   olopatadine (PATADAY) 0.1 % ophthalmic solution Place 1 drop into both eyes daily. 24 hr     oxyCODONE  (OXY IR/ROXICODONE ) 5  MG immediate release tablet Take 5 mg by mouth daily as needed for severe pain.     OXYGEN  Inhale 2.5 L into the lungs continuous.     pantoprazole  (PROTONIX ) 40 MG tablet Take 1 tablet (40 mg total) by mouth daily at 12 noon. (Patient taking  differently: Take 40 mg by mouth 2 (two) times daily.) 30 tablet 1   Probiotic Product (ALIGN) 4 MG CAPS Take 4 mg by mouth daily.     ranolazine  (RANEXA ) 500 MG 12 hr tablet TAKE ONE TABLET BY MOUTH TWICE DAILY 180 tablet 3   rosuvastatin (CRESTOR) 40 MG tablet Take 40 mg by mouth daily.     triamcinolone cream (KENALOG) 0.1 % Apply 1 Application topically daily. On both legs.With vitamin E oil     ezetimibe  (ZETIA ) 10 MG tablet Take 10 mg by mouth daily.     No current facility-administered medications for this visit.    REVIEW OF SYSTEMS:   [X]  denotes positive finding, [ ]  denotes negative finding Cardiac  Comments:  Chest pain or chest pressure:    Shortness of breath upon exertion:    Short of breath when lying flat:    Irregular heart rhythm:        Vascular    Pain in calf, thigh, or hip brought on by ambulation:    Pain in feet at night that wakes you up from your sleep:     Blood clot in your veins:    Leg swelling:         Pulmonary    Oxygen  at home:    Productive cough:     Wheezing:         Neurologic    Sudden weakness in arms or legs:     Sudden numbness in arms or legs:     Sudden onset of difficulty speaking or slurred speech:    Temporary loss of vision in one eye:     Problems with dizziness:         Gastrointestinal    Blood in stool:      Vomited blood:         Genitourinary    Burning when urinating:     Blood in urine:        Psychiatric    Major depression:         Hematologic    Bleeding problems:    Problems with blood clotting too easily:        Skin    Rashes or ulcers:        Constitutional    Fever or chills:     PHYSICAL EXAM:   Vitals:   12/21/23 0946  BP: 108/66  Pulse: 66   Temp: 97.7 F (36.5 C)  SpO2: 96%  Weight: 186 lb (84.4 kg)  Height: 5' 3 (1.6 m)    GENERAL: The patient is a well-nourished female, in no acute distress. The vital signs are documented above. CARDIAC: There is a regular rate and rhythm.  PULMONARY: Nonlabored respirations ABDOMEN: Soft and mildly tender to palpation MUSCULOSKELETAL: There are no major deformities or cyanosis. NEUROLOGIC: No focal weakness or paresthesias are detected. SKIN: There are no ulcers or rashes noted. PSYCHIATRIC: The patient has a normal affect.  STUDIES:   I have reviewed the following: CT: 1. 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 2. Otherwise, no acute intra-abdominal or intrapelvic process.  Duplex: Mesenteric:    No significant celiac or SMA stenosis identified.  No flow detected at the origin of the SMA. Distal SMA waveforms are  abnormal and  suggest proximal obstruction.   ASSESSMENT and PLAN   SMA stenosis: On CT scan, her SMA has about a greater than 95% stenosis.  While her symptoms  are not classic for mesenteric ischemia, I suspect that if her SMA is not treated that it would go on to be 100% occluded and she may develop symptoms.  In addition, because she does have some postprandial abdominal pain, this may alleviate some of her abdominal pain.  I would have her evaluated by GI.  She had a colonoscopy 2 years ago but probably needs repeat imaging.  I will schedule her angiogram in the next week or so.  Most likely outcome from the groin.  She will need to be off of her Eliquis .   Malvina Serene CLORE, MD, FACS [facility] [phone] Pager [phone]

## 2023-12-25 ENCOUNTER — Telehealth: Payer: Self-pay

## 2023-12-25 NOTE — Telephone Encounter (Signed)
 Copied from CRM [id number]. Topic: Clinical - Order For Equipment >> Dec 23, 2023  1:31 PM Rilla B wrote: Reason for CRM:  [facility] [employer]  calling regarding order for portable oxygen  concentrator.  Calling to see if we can fax a copy of patient's walk test.  Please FAX: [phone] >> Dec 25, 2023 10:06 AM Isabell A wrote: Harlene from [facility] is requesting a copy of patients walk test.    This order was placed by provider   We are faxing requested notes   -NFN

## 2023-12-29 ENCOUNTER — Ambulatory Visit (HOSPITAL_COMMUNITY)
Admission: RE | Admit: 2023-12-29 | Discharge: 2023-12-29 | Disposition: A | Discharge status: Home / Self Care | Attending: Surgery | Admitting: Surgery

## 2023-12-29 ENCOUNTER — Encounter (HOSPITAL_COMMUNITY)
Admission: RE | Disposition: A | Discharge status: Home / Self Care | Payer: Self-pay | Source: Home / Self Care | Attending: Surgery

## 2023-12-29 ENCOUNTER — Telehealth: Payer: Self-pay

## 2023-12-29 DIAGNOSIS — J449 Chronic obstructive pulmonary disease, unspecified: Secondary | ICD-10-CM | POA: Insufficient documentation

## 2023-12-29 DIAGNOSIS — I7779 Dissection of other artery: Secondary | ICD-10-CM | POA: Diagnosis not present

## 2023-12-29 DIAGNOSIS — I2510 Atherosclerotic heart disease of native coronary artery without angina pectoris: Secondary | ICD-10-CM | POA: Diagnosis not present

## 2023-12-29 DIAGNOSIS — K551 Chronic vascular disorders of intestine: Secondary | ICD-10-CM | POA: Diagnosis not present

## 2023-12-29 DIAGNOSIS — Z7901 Long term (current) use of anticoagulants: Secondary | ICD-10-CM | POA: Insufficient documentation

## 2023-12-29 DIAGNOSIS — I10 Essential (primary) hypertension: Secondary | ICD-10-CM | POA: Insufficient documentation

## 2023-12-29 DIAGNOSIS — Z87891 Personal history of nicotine dependence: Secondary | ICD-10-CM | POA: Insufficient documentation

## 2023-12-29 DIAGNOSIS — E7800 Pure hypercholesterolemia, unspecified: Secondary | ICD-10-CM | POA: Diagnosis not present

## 2023-12-29 DIAGNOSIS — I252 Old myocardial infarction: Secondary | ICD-10-CM | POA: Diagnosis not present

## 2023-12-29 DIAGNOSIS — Z79899 Other long term (current) drug therapy: Secondary | ICD-10-CM | POA: Insufficient documentation

## 2023-12-29 DIAGNOSIS — Z9981 Dependence on supplemental oxygen: Secondary | ICD-10-CM | POA: Diagnosis not present

## 2023-12-29 HISTORY — PX: LOWER EXTREMITY ANGIOGRAPHY: CATH118251

## 2023-12-29 HISTORY — PX: ABDOMINAL AORTOGRAM W/LOWER EXTREMITY: CATH118223

## 2023-12-29 HISTORY — PX: CORONARY ULTRASOUND/IVUS: CATH118244

## 2023-12-29 LAB — POCT I-STAT, CHEM 8
BUN: 13 mg/dL (ref 8–23)
Calcium, Ion: 1.13 mmol/L — ABNORMAL LOW (ref 1.15–1.40)
Chloride: 105 mmol/L (ref 98–111)
Creatinine, Ser: 0.7 mg/dL (ref 0.44–1.00)
Glucose, Bld: 123 mg/dL — ABNORMAL HIGH (ref 70–99)
HCT: 32 % — ABNORMAL LOW (ref 36.0–46.0)
Hemoglobin: 10.9 g/dL — ABNORMAL LOW (ref 12.0–15.0)
Potassium: 3.9 mmol/L (ref 3.5–5.1)
Sodium: 141 mmol/L (ref 135–145)
TCO2: 26 mmol/L (ref 22–32)

## 2023-12-29 SURGERY — ABDOMINAL AORTOGRAM W/LOWER EXTREMITY
Anesthesia: LOCAL

## 2023-12-29 MED ORDER — HEPARIN SODIUM (PORCINE) 1000 UNIT/ML IJ SOLN
INTRAMUSCULAR | Status: AC
  Filled 2023-12-29: qty 10

## 2023-12-29 MED ORDER — MIDAZOLAM HCL (PF) 2 MG/2ML IJ SOLN
INTRAMUSCULAR | Status: DC | PRN
  Administered 2023-12-29 (×3): 1 mg via INTRAVENOUS

## 2023-12-29 MED ORDER — MIDAZOLAM HCL 2 MG/2ML IJ SOLN
INTRAMUSCULAR | Status: AC
  Filled 2023-12-29: qty 2

## 2023-12-29 MED ORDER — ONDANSETRON HCL 4 MG/2ML IJ SOLN: 4.0000 mg | Freq: Four times a day (QID) | INTRAMUSCULAR | Status: DC | PRN

## 2023-12-29 MED ORDER — FENTANYL CITRATE (PF) 100 MCG/2ML IJ SOLN
INTRAMUSCULAR | Status: AC
  Filled 2023-12-29: qty 2

## 2023-12-29 MED ORDER — SODIUM CHLORIDE 0.9 % WEIGHT BASED INFUSION: 1.0000 mL/kg/h | INTRAVENOUS | Status: DC

## 2023-12-29 MED ORDER — MORPHINE SULFATE (PF) 2 MG/ML IV SOLN: 2.0000 mg | INTRAVENOUS | Status: DC | PRN

## 2023-12-29 MED ORDER — LIDOCAINE HCL (PF) 1 % IJ SOLN
INTRAMUSCULAR | Status: AC
  Filled 2023-12-29: qty 30

## 2023-12-29 MED ORDER — LABETALOL HCL 5 MG/ML IV SOLN: 10.0000 mg | INTRAVENOUS | Status: DC | PRN

## 2023-12-29 MED ORDER — ACETAMINOPHEN 325 MG PO TABS: 650.0000 mg | ORAL_TABLET | ORAL | Status: DC | PRN

## 2023-12-29 MED ORDER — HYDRALAZINE HCL 20 MG/ML IJ SOLN: 5.0000 mg | INTRAMUSCULAR | Status: DC | PRN

## 2023-12-29 MED ORDER — SODIUM CHLORIDE 0.9 % IV SOLN: 250.0000 mL | INTRAVENOUS | Status: DC | PRN

## 2023-12-29 MED ORDER — HEPARIN SODIUM (PORCINE) 1000 UNIT/ML IJ SOLN
INTRAMUSCULAR | Status: DC | PRN
  Administered 2023-12-29: 8000 [IU] via INTRAVENOUS

## 2023-12-29 MED ORDER — FENTANYL CITRATE (PF) 100 MCG/2ML IJ SOLN
INTRAMUSCULAR | Status: DC | PRN
  Administered 2023-12-29 (×2): 25 ug via INTRAVENOUS
  Administered 2023-12-29: 50 ug via INTRAVENOUS

## 2023-12-29 MED ORDER — HEPARIN (PORCINE) IN NACL 1000-0.9 UT/500ML-% IV SOLN
INTRAVENOUS | Status: DC | PRN
  Administered 2023-12-29 (×2): 500 mL

## 2023-12-29 MED ORDER — SODIUM CHLORIDE 0.9% FLUSH: 3.0000 mL | INTRAVENOUS | Status: DC | PRN

## 2023-12-29 MED ORDER — OXYCODONE HCL 5 MG PO TABS: 5.0000 mg | ORAL_TABLET | ORAL | Status: DC | PRN

## 2023-12-29 MED ORDER — SODIUM CHLORIDE 0.9% FLUSH: 3.0000 mL | Freq: Two times a day (BID) | INTRAVENOUS | Status: DC

## 2023-12-29 MED ORDER — SODIUM CHLORIDE 0.9 % IV SOLN: INTRAVENOUS | Status: DC

## 2023-12-29 MED ORDER — LIDOCAINE HCL (PF) 1 % IJ SOLN
INTRAMUSCULAR | Status: DC | PRN
  Administered 2023-12-29: 15 mL via INTRADERMAL

## 2023-12-29 SURGICAL SUPPLY — 22 items
CATH OMNI FLUSH 5F 65CM (CATHETERS) IMPLANT
CATH OPTICROSS 18 (CATHETERS) IMPLANT
CATH QUICKCROSS SUPP .035X90CM (MICROCATHETER) IMPLANT
CATH SOS OMNI O 5F 80CM (CATHETERS) IMPLANT
CLOSURE PERCLOSE PROSTYLE (Vascular Products) IMPLANT
DEVICE VASC CLSR CELT ART 7 (Vascular Products) IMPLANT
GLIDEWIRE ADV .035X260CM (WIRE) IMPLANT
GUIDEWIRE ANGLED .035X260CM (WIRE) IMPLANT
KIT MICROPUNCTURE NIT STIFF (SHEATH) IMPLANT
KIT PV (KITS) ×1 IMPLANT
KIT SINGLE USE MANIFOLD (KITS) IMPLANT
SET ATX-X65L (MISCELLANEOUS) IMPLANT
SHEATH APTUS 6.5FR 45CM (SHEATH) IMPLANT
SHEATH PINNACLE 5F 10CM (SHEATH) IMPLANT
SHEATH PINNACLE 6F 10CM (SHEATH) IMPLANT
SHEATH PINNACLE 7F 10CM (SHEATH) IMPLANT
SHEATH PROBE COVER 6X72 (BAG) IMPLANT
SYR MEDRAD MARK 7 150ML (SYRINGE) ×1 IMPLANT
TRANSDUCER W/STOPCOCK (MISCELLANEOUS) ×1 IMPLANT
TRAY PV CATH (CUSTOM PROCEDURE TRAY) ×1 IMPLANT
WIRE BENTSON .035X145CM (WIRE) IMPLANT
WIRE G V18X300CM (WIRE) IMPLANT

## 2023-12-29 NOTE — Progress Notes (Signed)
 Patient ambulated to the bathroom and voided. Right groin site dressing clean, dry, and intact. No hematoma or bleeding noted to the site when the patient returned to the room. Patient getting dressed for discharge.

## 2023-12-29 NOTE — Telephone Encounter (Signed)
 Copied from CRM [id number]. Topic: Clinical - Order For Equipment >> Dec 23, 2023  1:31 PM Rilla B wrote: Reason for CRM:  [employer] [employer]  calling regarding order for portable oxygen  concentrator.  Calling to see if we can fax a copy of patient's walk test.  Please FAX: [phone] >> Dec 29, 2023 10:54 AM [name] wrote: Harlene, with [employer], contacting clinic regarding order form she faxed over on 10/17. Is needing the form signed and dated by provider. Reviewed chart and nothing has been scanned in from that date. Provided alternate fax # and she will resend request and follow up in a few days. NFN >> Dec 25, 2023 10:06 AM Isabell A wrote: Harlene from [employer] is requesting a copy of patients walk test.    Provider is not in Clinic until 12/30/23, he will sign paperwork then and I will fax it along with the walk test.

## 2023-12-29 NOTE — Discharge Instructions (Signed)
 Femoral Site Care This sheet gives you information about how to care for yourself after your procedure. Your health care provider may also give you more specific instructions. If you have problems or questions, contact your health care provider. What can I expect after the procedure?  After the procedure, it is common to have: Bruising that usually fades within 1-2 weeks. Tenderness at the site. Follow these instructions at home: Wound care Follow instructions from your health care provider about how to take care of your insertion site. Make sure you: Wash your hands with soap and water  before you change your bandage (dressing). If soap and water  are not available, use hand sanitizer. Remove your dressing as told by your health care provider. 24 hours Do not take baths, swim, or use a hot tub until your health care provider approves. You may shower 24-48 hours after the procedure or as told by your health care provider. Gently wash the site with plain soap and water . Pat the area dry with a clean towel. Do not rub the site. This may cause bleeding. Do not apply powder or lotion to the site. Keep the site clean and dry. Check your femoral site every day for signs of infection. Check for: Redness, swelling, or pain. Fluid or blood. Warmth. Pus or a bad smell. Activity For the first 2-3 days after your procedure, or as long as directed: Avoid climbing stairs as much as possible. Do not squat. Do not lift anything that is heavier than 10 lb (4.5 kg), or the limit that you are told, until your health care provider says that it is safe. For 5 days Rest as directed. Avoid sitting for a long time without moving. Get up to take short walks every 1-2 hours. Do not drive for 24 hours if you were given a medicine to help you relax (sedative). General instructions Take over-the-counter and prescription medicines only as told by your health care provider. Keep all follow-up visits as told by your  health care provider. This is important. Contact a health care provider if you have: A fever or chills. You have redness, swelling, or pain around your insertion site. Get help right away if: The catheter insertion area swells very fast. You pass out. You suddenly start to sweat or your skin gets clammy. The catheter insertion area is bleeding, and the bleeding does not stop when you hold steady pressure on the area. The area near or just beyond the catheter insertion site becomes pale, cool, tingly, or numb. These symptoms may represent a serious problem that is an emergency. Do not wait to see if the symptoms will go away. Get medical help right away. Call your local emergency services (911 in the [place] Do not drive yourself to the hospital. Summary After the procedure, it is common to have bruising that usually fades within 1-2 weeks. Check your femoral site every day for signs of infection. Do not lift anything that is heavier than 10 lb (4.5 kg), or the limit that you are told, until your health care provider says that it is safe. This information is not intended to replace advice given to you by your health care provider. Make sure you discuss any questions you have with your health care provider. Document Revised: 03/09/2017 Document Reviewed: 03/09/2017 Elsevier Patient Education  2020 [employer].

## 2023-12-29 NOTE — Progress Notes (Signed)
 Patient dressed for discharge. Assessed right groin site. No bleeding or hematoma noted. Dressing clean, dry, and intact.

## 2023-12-29 NOTE — Op Note (Signed)
 Patient name: Desiree Leon MRN: [id number] DOB: 01/08/1941 Sex: female  12/29/2023 Pre-operative Diagnosis: SMA stenosis Post-operative diagnosis:  Same Surgeon:  [name] Procedure Performed:  1.  Ultrasound-guided access, right femoral artery  2.  Abdominal aortogram  3.  Selective injection with catheter in the superior mesenteric artery  4.  Conscious sedation, 68 minutes  5.  IVUS (intravascular ultrasound) superior mesenteric artery    Indications: This is an 83 year old female with chronic abdominal pain who had a CT scan that showed a high-grade SMA stenosis.  She comes in today for further evaluation and possible intervention.  Procedure:  The patient was identified in the holding area and taken to room 8.  The patient was then placed supine on the table and prepped and draped in the usual sterile fashion.  A time out was called.  Conscious sedation was administered with the use of IV fentanyl  and Versed  under continuous physician and nurse monitoring.  Heart rate, blood pressure, and oxygen  saturation were continuously monitored.  Total sedation time was 68 minutes.  Ultrasound was used to evaluate the right common femoral artery.  It was patent .  A digital ultrasound image was acquired.  A micropuncture needle was used to access the right common femoral artery under ultrasound guidance.  An 018 wire was advanced without resistance and a micropuncture sheath was placed.  The 018 wire was removed and a benson wire was placed.  The micropuncture sheath was exchanged for a 5 french sheath.  An omniflush catheter was advanced over the wire to the level of T12 and an abdominal aortogram in the PA and lateral projections were performed Findings:   Aortogram: Celiac artery and its branches are widely patent.  The ostial portion of the superior mesenteric artery does not opacify.  There is delayed reconstitution of the SMA.  The inferior mesenteric artery appears to be widely patent.   No significant renal artery stenosis.  Intervention: After the above images were acquired the decision made to proceed with intervention.  I first used a SOS catheter to probe the origin of the superior mesenteric artery with a Glidewire advantage.  I was able to advance the Glidewire several centimeters into the superior mesenteric artery but it would not advance into the distal artery.  I then took a contrast injection through the sauce catheter which indicated dissection.  I then switched out to a regular glide wire.  I was able to easily advance the Glidewire into the distal superior mesenteric artery.  I followed this with a quick cross catheter and perform selective injections with the catheter in the distal superior mesenteric artery.  This confirmed that I was true lumen however I did feel that there was a residual dissection and I could not see where the entry point was.  At this point, I switched out for a 6.5 Oscor sheath.  I then exchanged out for a V-18 and performed intravascular ultrasound of the superior mesenteric artery.  Was difficult to tell where the true lumen was.  I then performed multiple additional imaging.  Ultimately I was not comfortable stenting the origin of the superior mesenteric artery I was also concerned about perfusion distally since I did not know where the dissection entered.  I elected to stop.  I switched out for 6 French sheath and went to close however she had developed a small hematoma and so I upsized to a 7 [place] sheath and closed the groin with a Pro-glide.  Impression:  #  1  High-grade stenosis at the origin of the superior mesenteric artery.  Crossing was not successful as a dissection plane was encountered and the extent of the dissection could not be determined and so stenting was aborted.  #2  Once the dissection heals, the patient could be considered for repeat attempted intervention   V. [name], M.D., [facility]  [place] Office: [phone] Pager:  [phone]

## 2023-12-29 NOTE — Interval H&P Note (Signed)
 History and Physical Interval Note:  12/29/2023 11:51 AM  Desiree Leon  has presented today for surgery, with the diagnosis of mestentric artery stenosis.  The various methods of treatment have been discussed with the patient and family. After consideration of risks, benefits and other options for treatment, the patient has consented to  Procedure(s): ABDOMINAL AORTOGRAM W/LOWER EXTREMITY (N/A) Lower Extremity Angiography (N/A) as a surgical intervention.  The patient's history has been reviewed, patient examined, no change in status, stable for surgery.  I have reviewed the patient's chart and labs.  Questions were answered to the patient's satisfaction.     [name]

## 2023-12-30 ENCOUNTER — Encounter (HOSPITAL_COMMUNITY): Payer: Self-pay | Admitting: Surgery

## 2024-01-01 ENCOUNTER — Telehealth: Payer: Self-pay

## 2024-01-01 NOTE — Telephone Encounter (Signed)
 CMN's faxed to [employer]

## 2024-01-03 DIAGNOSIS — J4489 Other specified chronic obstructive pulmonary disease: Secondary | ICD-10-CM | POA: Diagnosis not present

## 2024-01-03 DIAGNOSIS — I700 Atherosclerosis of aorta: Secondary | ICD-10-CM | POA: Diagnosis not present

## 2024-01-03 DIAGNOSIS — I4891 Unspecified atrial fibrillation: Secondary | ICD-10-CM | POA: Diagnosis not present

## 2024-01-03 DIAGNOSIS — I509 Heart failure, unspecified: Secondary | ICD-10-CM | POA: Diagnosis not present

## 2024-01-03 DIAGNOSIS — I130 Hypertensive heart and chronic kidney disease with heart failure and stage 1 through stage 4 chronic kidney disease, or unspecified chronic kidney disease: Secondary | ICD-10-CM | POA: Diagnosis not present

## 2024-01-03 DIAGNOSIS — I25119 Atherosclerotic heart disease of native coronary artery with unspecified angina pectoris: Secondary | ICD-10-CM | POA: Diagnosis not present

## 2024-01-03 DIAGNOSIS — F325 Major depressive disorder, single episode, in full remission: Secondary | ICD-10-CM | POA: Diagnosis not present

## 2024-01-03 DIAGNOSIS — N1831 Chronic kidney disease, stage 3a: Secondary | ICD-10-CM | POA: Diagnosis not present

## 2024-01-03 DIAGNOSIS — K551 Chronic vascular disorders of intestine: Secondary | ICD-10-CM | POA: Diagnosis not present

## 2024-01-04 ENCOUNTER — Telehealth: Payer: Self-pay

## 2024-01-04 NOTE — Telephone Encounter (Signed)
 Copied from CRM [id number]. Topic: Clinical - Order For Equipment >> Dec 23, 2023  1:31 PM Rilla B wrote: Reason for CRM:  [employer] [employer]  calling regarding order for portable oxygen  concentrator.  Calling to see if we can fax a copy of patient's walk test.  Please FAX: [phone] >> Jan 04, 2024 12:04 PM [name] wrote: Harlene, with [employer], is contacting clinic regarding a mistake she made on order for POC that was sent to provider to sign on 10/24. She reports putting incorrect frequency that does not match OV notes. She has made the correction and will be faxing back over today and will need it signed and dated legibly in ink by provider.   FX#  [phone] >> Dec 29, 2023 10:54 AM [name] wrote: Harlene, with [employer], contacting clinic regarding order form she faxed over on 10/17. Is needing the form signed and dated by provider. Reviewed chart and nothing has been scanned in from that date. Provided alternate fax # and she will resend request and follow up in a few days. NFN >> Dec 25, 2023 10:06 AM Isabell A wrote: Harlene from [facility] is requesting a copy of patients walk test.   Walk test faxed - updated paperwork faxed on 10/23-24/25  -NFN

## 2024-01-04 NOTE — Telephone Encounter (Signed)
 Copied from CRM [id number]. Topic: Clinical - Order For Equipment >> Jan 04, 2024 12:04 PM [name] wrote: Harlene, with [employer], is contacting clinic regarding a mistake she made on order for POC that was sent to provider to sign on 10/24. She reports putting incorrect frequency that does not match OV notes. She has made the correction and will be faxing back over today and will need it signed and dated legibly in ink by provider.   FX#  [phone]

## 2024-01-06 ENCOUNTER — Other Ambulatory Visit: Payer: Self-pay

## 2024-01-06 DIAGNOSIS — K551 Chronic vascular disorders of intestine: Secondary | ICD-10-CM

## 2024-01-06 NOTE — Telephone Encounter (Signed)
 Ms harlene from [employer] faxed over a order form for dr dewald to sign and date . we got the walk test . needed to make a change on the order form and have to get the form resigned . the date dr dewald had signed need to be resigned and the date needs to be written clearer. Fax [phone]

## 2024-01-07 DIAGNOSIS — I25119 Atherosclerotic heart disease of native coronary artery with unspecified angina pectoris: Secondary | ICD-10-CM | POA: Diagnosis not present

## 2024-01-07 DIAGNOSIS — I10 Essential (primary) hypertension: Secondary | ICD-10-CM | POA: Diagnosis not present

## 2024-01-14 NOTE — Progress Notes (Signed)
 Chief Complaint  Patient presents with   Follow-up    Pt in 2 with daughter Pt here for stroke and memory f/u  Daughter states pt tremors are worse daughter states pt memory is a little worse  Pt states several hospital admission     HISTORY OF PRESENT ILLNESS:  01/20/24 ALL:  Desiree Leon is a 83 y.o. female here today for follow up for hx of left cerebellar infarct 04/2023. She was last seen by Dr Rosemarie and doing well. Since, she reports doing well, overall. Headaches stable. She has noted worsening tremor. Notes with writing or trying to hold a cup. Able to complete ADLs. She feels memory is slightly worse. Most difficulty with word finding. Able to manage medications and finances. Completes ADLs independently. She is on continuous O2. She continues Eliquis  and rosuvastatin. She has been hospitalized multiple times for abdominal pain. Found to have superior mesenteric artery stenosis. Unable to stent.    HISTORY (copied from Dr Bucky previous note)  HPI: Ms. Desiree Leon is a 83 year old pleasant Caucasian lady seen today for initial office follow-up visit following hospital consultation for stroke in January 2025.  History is obtained from the patient and review of electronic medical records.  I personally reviewed pertinent available imaging films in PACS.  has a past medical history of Anxiety, Blood transfusion, CAP (community acquired pneumonia), COPD (chronic obstructive pulmonary disease) (HCC), Heart murmur, History of colon polyps, Hyperlipidemia, Hypertension, MI (myocardial infarction) (HCC), Mitral valve prolapse, NSTEMI (non-ST elevated myocardial infarction) (HCC), Osteoarthritis, Osteoporosis, Pulmonary emboli (HCC) (04/12/2019), Spinal stenosis, Thyroid  disease, Unstable angina (HCC) (05/08/2014), and Venous insufficiency. who presented on 04/04/2023 with  dizziness, headache, twitching, left leg and left arm weakness.  She stated that her symptoms initially started on Monday with  her feeling like she was stumbling, dizziness, room spinning.  She did not have any falls or hit her head.  She did miss a day of her Eliquis  at the beginning of the month because the pharmacy ran out and she was unable to get it.  She has not missed any medication since then .  She had had a history of lower GI bleed requiring more than 4 units of blood and 24 hours ICU stay on 04/23/2022 and found to have multiple large AVMs which were treated with APC and Uristat.  On office follow-up with cardiologist on 05/19/2022 decided to reduce the dose of Eliquis  from 5 mg twice daily to 2.5 mg twice daily and she was also requiring iron  infusions to maintain her blood counts.  Her NIH stroke scale was 1 on admission.  CT head showed no acute abnormality and CT angiogram showed no large vessel stenosis or occlusion.  MRI scan showed a acute to subacute small left cerebellar infarct.  LDL cholesterol was 77 mg percent.  Hemoglobin A1c 6.4.  Echocardiogram showed ejection fraction of 60 to 75%.  There was mild left atrial dilatation.  She subsequently underwent 30-day heart monitor which was negative for paroxysmal A-fib.  She was continued on low-dose Eliquis  2.5 twice daily.  She states she is done well since then.  She still has a little bit of tremor in the left upper extremity but it is improving.  She does have mild posterior headaches which are not disabling but are improving.  She is still on home oxygen .   REVIEW OF SYSTEMS: Out of a complete 14 system review of symptoms, the patient complains only of the following symptoms, headaches, tremor and  all other reviewed systems are negative.   ALLERGIES: Allergies  Allergen Reactions   Other Anaphylaxis and Cough    fragarances-perfumes   Dilaudid [Hydromorphone Hcl] Nausea Only   Erythromycin Nausea And Vomiting   Diflucan [Fluconazole] Palpitations and Hypertension    When taken with Nicardipine , reaction happens.     HOME MEDICATIONS: Outpatient  Medications Prior to Visit  Medication Sig Dispense Refill   acetaminophen  (TYLENOL ) 500 MG tablet Take 500 mg by mouth daily as needed for fever or headache.     albuterol  (VENTOLIN  HFA) 108 (90 Base) MCG/ACT inhaler Inhale 1-2 puffs into the lungs every 6 (six) hours as needed for wheezing or shortness of breath. 8 g 11   ALPRAZolam  (XANAX ) 0.25 MG tablet Take 0.25 mg by mouth daily as needed for anxiety.     apixaban  (ELIQUIS ) 2.5 MG TABS tablet Take 1 tablet (2.5 mg total) by mouth 2 (two) times daily. Resume on 4/8 evening 180 tablet 3   budesonide -formoterol  (SYMBICORT ) 160-4.5 MCG/ACT inhaler Inhale 2 puffs into the lungs 2 (two) times daily. 1 each 12   Cholecalciferol  (VITAMIN D3) 50 MCG (2000 UT) capsule Take 2,000 Units by mouth daily.     escitalopram  (LEXAPRO ) 5 MG tablet Take 5 mg by mouth every evening.      ezetimibe  (ZETIA ) 10 MG tablet Take 10 mg by mouth daily.     furosemide  (LASIX ) 20 MG tablet Take 20 mg by mouth daily as needed for fluid or edema. (Patient taking differently: Take 20 mg by mouth as needed for fluid or edema.)     glucose blood test strip Accu-Chek SmartView Test Strips  CHECK BLOOD SUGAR TWICE DAILY.     levothyroxine  (SYNTHROID ) 137 MCG tablet Take 137 mcg by mouth every morning.     metoprolol  tartrate (LOPRESSOR ) 25 MG tablet Take 0.5 tablets (12.5 mg total) by mouth 2 (two) times daily. 90 tablet 3   multivitamin-iron -minerals-folic acid (CENTRUM) chewable tablet Chew 1 tablet by mouth daily.     niCARdipine  (CARDENE ) 20 MG capsule Take 20 mg by mouth at bedtime.     nitroGLYCERIN  (NITRODUR - DOSED IN MG/24 HR) 0.4 mg/hr patch Place 0.4 mg onto the skin daily.     nitroGLYCERIN  (NITROSTAT ) 0.4 MG SL tablet Place 1 tablet (0.4 mg total) under the tongue every 5 (five) minutes as needed for chest pain. 25 tablet 11   olopatadine (PATADAY) 0.1 % ophthalmic solution Place 1 drop into both eyes daily. 24 hr     OXYGEN  Inhale 2.5 L into the lungs continuous.      pantoprazole  (PROTONIX ) 40 MG tablet Take 1 tablet (40 mg total) by mouth daily at 12 noon. (Patient taking differently: Take 40 mg by mouth 2 (two) times daily.) 30 tablet 1   Probiotic Product (ALIGN) 4 MG CAPS Take 4 mg by mouth daily.     ranolazine  (RANEXA ) 500 MG 12 hr tablet TAKE ONE TABLET BY MOUTH TWICE DAILY 180 tablet 3   rosuvastatin (CRESTOR) 40 MG tablet Take 40 mg by mouth every evening.     triamcinolone cream (KENALOG) 0.1 % Apply 1 Application topically daily. On both legs.With vitamin E oil     methocarbamol (ROBAXIN) 500 MG tablet Take 250-500 mg by mouth daily as needed for muscle spasms. (Patient not taking: Reported on 01/20/2024)     oxyCODONE  (OXY IR/ROXICODONE ) 5 MG immediate release tablet Take 5 mg by mouth daily as needed for severe pain.     No facility-administered  medications prior to visit.     PAST MEDICAL HISTORY: Past Medical History:  Diagnosis Date   Anxiety    Blood transfusion    CAP (community acquired pneumonia)    COPD (chronic obstructive pulmonary disease) (HCC)    Heart murmur    History of colon polyps    Hyperlipidemia    Hypertension    MI (myocardial infarction) (HCC)    Mitral valve prolapse    NSTEMI (non-ST elevated myocardial infarction) (HCC)    Osteoarthritis    Osteoporosis    Pulmonary emboli (HCC) 04/12/2019   Spinal stenosis    Thyroid  disease    hypothyroidism   Unstable angina (HCC) 05/08/2014   Venous insufficiency      PAST SURGICAL HISTORY: Past Surgical History:  Procedure Laterality Date   ABDOMINAL AORTOGRAM W/LOWER EXTREMITY N/A 12/29/2023   Procedure: ABDOMINAL AORTOGRAM W/LOWER EXTREMITY;  Surgeon: Serene Gaile ORN, MD;  Location: [facility];  Service: Cardiovascular;  Laterality: N/A;   ABDOMINAL HYSTERECTOMY  11/26/1976   APPENDECTOMY  02/14/1978   BASAL CELL CARCINOMA EXCISION  01/26/1990   COLONOSCOPY  1993, 2003, 2008, 2012   COLONOSCOPY WITH PROPOFOL  N/A 04/26/2022   Procedure:  COLONOSCOPY WITH PROPOFOL ;  Surgeon: Elicia Claw, MD;  Location: [facility];  Service: Gastroenterology;  Laterality: N/A;   CORONARY ANGIOGRAPHY N/A 06/16/2023   Procedure: CORONARY ANGIOGRAPHY;  Surgeon: Elmira Newman PARAS, MD;  Location: [facility];  Service: Cardiovascular;  Laterality: N/A;   CORONARY ULTRASOUND/IVUS N/A 12/29/2023   Procedure: Coronary Ultrasound/IVUS;  Surgeon: Serene Gaile ORN, MD;  Location: [facility];  Service: Cardiovascular;  Laterality: N/A;   DILATION AND CURETTAGE OF UTERUS  09/1976   ESOPHAGOGASTRODUODENOSCOPY (EGD) WITH PROPOFOL  N/A 04/25/2022   Procedure: ESOPHAGOGASTRODUODENOSCOPY (EGD) WITH PROPOFOL ;  Surgeon: Burnette Fallow, MD;  [facility];  Service: Gastroenterology;  Laterality: N/A;   HEMOSTASIS CONTROL  04/26/2022   Procedure: HEMOSTASIS CONTROL;  Surgeon: Elicia Claw, MD;  Location: [facility];  Service: Gastroenterology;;   HOT HEMOSTASIS N/A 04/26/2022   Procedure: HOT HEMOSTASIS (ARGON PLASMA COAGULATION/BICAP);  Surgeon: Elicia Claw, MD;  Location: [facility] ENDOSCOPY;  Service: Gastroenterology;  Laterality: N/A;   LEFT HEART CATHETERIZATION WITH CORONARY ANGIOGRAM N/A 04/18/2014   Procedure: LEFT HEART CATHETERIZATION WITH CORONARY ANGIOGRAM;  Surgeon: Peter M Jordan, MD;  Location: [facility] CATH LAB;  Service: Cardiovascular;  Laterality: N/A;   LOWER EXTREMITY ANGIOGRAPHY N/A 12/29/2023   Procedure: Lower Extremity Angiography;  Surgeon: Serene Gaile ORN, MD;  Location: [facility];  Service: Cardiovascular;  Laterality: N/A;   MASS EXCISION  10/20/2011   Procedure: MINOR EXCISION OF MASS;  Surgeon: Elon CHRISTELLA Pacini, MD;  Location: [facility];  Service: General;  Laterality: N/A;  Excision mass posterior neck   OOPHORECTOMY  02/14/1978   POLYPECTOMY  04/26/2022   Procedure: POLYPECTOMY;  Surgeon: Elicia Claw, MD;  Location: [facility];  Service: Gastroenterology;;   SHOULDER SURGERY   10/18/2007   basal cell   SQUAMOUS CELL CARCINOMA EXCISION     TONSILLECTOMY  1955   TUBAL LIGATION  07/08/74     FAMILY HISTORY: Family History  Problem Relation Age of Onset   Heart disease Mother    Heart disease Father    Cancer Sister        breast   Cancer Daughter        cervical and bone   Heart disease Sister    Thyroid  disease Sister  SOCIAL HISTORY: Social History   Socioeconomic History   Marital status: Widowed    Spouse name: Not on file   Number of children: 3   Years of education: Not on file   Highest education level: Not on file  Occupational History   Not on file  Tobacco Use   Smoking status: Former    Current packs/day: 0.00    Average packs/day: 0.5 packs/day for 30.0 years (15.0 ttl pk-yrs)    Types: Cigarettes    Start date: 03/11/1975    Quit date: 03/10/2005    Years since quitting: 18.8   Smokeless tobacco: Never  Vaping Use   Vaping status: Never Used  Substance and Sexual Activity   Alcohol  use: No   Drug use: No   Sexual activity: Not on file  Other Topics Concern   Not on file  Social History Narrative   Not on file   Social Drivers of Health   Financial Resource Strain: Not on file  Food Insecurity: No Food Insecurity (04/05/2023)   Hunger Vital Sign    Worried About Running Out of Food in the Last Year: Never true    Ran Out of Food in the Last Year: Never true  Transportation Needs: No Transportation Needs (04/05/2023)   PRAPARE - Administrator, Civil Service (Medical): No    Lack of Transportation (Non-Medical): No  Physical Activity: Not on file  Stress: Not on file  Social Connections: Socially Isolated (04/05/2023)   Social Connection and Isolation Panel    Frequency of Communication with Friends and Family: More than three times a week    Frequency of Social Gatherings with Friends and Family: More than three times a week    Attends Religious Services: Never    Database Administrator or Organizations:  No    Attends Banker Meetings: Never    Marital Status: Widowed  Intimate Partner Violence: Not At Risk (04/05/2023)   Humiliation, Afraid, Rape, and Kick questionnaire    Fear of Current or Ex-Partner: No    Emotionally Abused: No    Physically Abused: No    Sexually Abused: No     PHYSICAL EXAM  Vitals:   01/20/24 1410  BP: 124/61  Pulse: 70  Weight: 185 lb (83.9 kg)  Height: 5' 3 (1.6 m)   Body mass index is 32.77 kg/m.  Generalized: Well developed, in no acute distress  Cardiology: normal rate and rhythm, no murmur auscultated  Respiratory: clear to auscultation bilaterally    Neurological examination  Mentation: Alert oriented to time, place, history taking. Follows all commands speech and language fluent Cranial nerve II-XII: Pupils were equal round reactive to light. Extraocular movements were full, visual field were full on confrontational test. Facial sensation and strength were normal. Uvula tongue midline. Head turning and shoulder shrug  were normal and symmetric. Motor: The motor testing reveals 5 over 5 strength of all 4 extremities. Good symmetric motor tone is noted throughout. Mild action tremor noted.  Sensory: Sensory testing is intact to soft touch on all 4 extremities. No evidence of extinction is noted.  Coordination: Cerebellar testing reveals good finger-nose-finger and heel-to-shin bilaterally.  Gait and station: Gait is slightly off balance. Did not bring assistive device. Holds onto daughter's arm.      DIAGNOSTIC DATA (LABS, IMAGING, TESTING) - I reviewed patient records, labs, notes, testing and imaging myself where available.  Lab Results  Component Value Date   WBC 8.2 12/10/2023  HGB 10.9 (L) 12/29/2023   HCT 32.0 (L) 12/29/2023   MCV 97.7 12/10/2023   PLT 228 12/10/2023      Component Value Date/Time   NA 141 12/29/2023 1117   NA 140 06/11/2023 1448   K 3.9 12/29/2023 1117   CL 105 12/29/2023 1117   CO2 20 (L)  12/10/2023 1419   GLUCOSE 123 (H) 12/29/2023 1117   BUN 13 12/29/2023 1117   BUN 12 06/11/2023 1448   CREATININE 0.70 12/29/2023 1117   CALCIUM 8.8 (L) 12/10/2023 1419   PROT 6.0 (L) 12/10/2023 1419   ALBUMIN 3.5 12/10/2023 1419   AST 18 12/10/2023 1419   ALT 14 12/10/2023 1419   ALKPHOS 69 12/10/2023 1419   BILITOT 0.8 12/10/2023 1419   GFRNONAA >60 12/10/2023 1419   GFRAA >60 04/12/2019 0317   Lab Results  Component Value Date   CHOL 141 04/05/2023   HDL 43 04/05/2023   LDLCALC 77 04/05/2023   TRIG 107 04/05/2023   CHOLHDL 3.3 04/05/2023   Lab Results  Component Value Date   HGBA1C 6.4 (H) 04/04/2023   No results found for: VITAMINB12 Lab Results  Component Value Date   TSH 7.164 (H) 04/04/2023       01/20/2024    2:13 PM 06/24/2023   10:32 AM  MMSE - Mini Mental State Exam  Orientation to time 5 4  Orientation to Place 5 5  Registration 3 3  Attention/ Calculation 1 5  Recall 3 3  Language- name 2 objects 2 2  Language- repeat 1 1  Language- follow 3 step command 3 3  Language- read & follow direction 1 1  Write a sentence 1 1  Copy design 0 0  Total score 25 28         No data to display           ASSESSMENT AND PLAN  83 y.o. year old female  has a past medical history of Anxiety, Blood transfusion, CAP (community acquired pneumonia), COPD (chronic obstructive pulmonary disease) (HCC), Heart murmur, History of colon polyps, Hyperlipidemia, Hypertension, MI (myocardial infarction) (HCC), Mitral valve prolapse, NSTEMI (non-ST elevated myocardial infarction) (HCC), Osteoarthritis, Osteoporosis, Pulmonary emboli (HCC) (04/12/2019), Spinal stenosis, Thyroid  disease, Unstable angina (HCC) (05/08/2014), and Venous insufficiency. here with    Cerebellar infarct [facility])  Cryptogenic stroke [facility])  Tremor of left hand  Memory loss  Drooling  Fall, initial encounter  MALONE ADMIRE reports doing fairly well. Tremor is mild. Will monitor. She  reports recent difficulty with drooling. No obvious cause. No difficulty swallowing. Will monitor. Headaches stable. She has had two falls. Not using assistive device. Fall precautions advised. She will continue Eliquis  and rosuvastatin per PCP/cardiology. Memory compensation strategies advised. MMSE 25/30. Healthy lifestyle habits encouraged. She will follow up with PCP as directed. She will return to see me in 6 months, sooner if needed. She verbalizes understanding and agreement with this plan.   No orders of the defined types were placed in this encounter.    No orders of the defined types were placed in this encounter.    Greig Forbes, MSN, FNP-C 01/20/2024, 4:15 PM  [facility] [address] [place], [place] [address] [phone]

## 2024-01-15 DIAGNOSIS — I2609 Other pulmonary embolism with acute cor pulmonale: Secondary | ICD-10-CM | POA: Diagnosis not present

## 2024-01-15 DIAGNOSIS — Z9981 Dependence on supplemental oxygen: Secondary | ICD-10-CM | POA: Diagnosis not present

## 2024-01-18 DIAGNOSIS — J9611 Chronic respiratory failure with hypoxia: Secondary | ICD-10-CM | POA: Diagnosis not present

## 2024-01-19 ENCOUNTER — Ambulatory Visit: Admitting: Family Medicine

## 2024-01-19 ENCOUNTER — Telehealth: Payer: Self-pay

## 2024-01-19 NOTE — Telephone Encounter (Signed)
 Signed Paperwork faxed to [employer]

## 2024-01-20 ENCOUNTER — Ambulatory Visit: Admitting: Family Medicine

## 2024-01-20 ENCOUNTER — Encounter: Payer: Self-pay | Admitting: Family Medicine

## 2024-01-20 VITALS — BP 124/61 | HR 70 | Ht 63.0 in | Wt 185.0 lb

## 2024-01-20 DIAGNOSIS — K117 Disturbances of salivary secretion: Secondary | ICD-10-CM

## 2024-01-20 DIAGNOSIS — R413 Other amnesia: Secondary | ICD-10-CM

## 2024-01-20 DIAGNOSIS — R251 Tremor, unspecified: Secondary | ICD-10-CM | POA: Diagnosis not present

## 2024-01-20 DIAGNOSIS — W19XXXA Unspecified fall, initial encounter: Secondary | ICD-10-CM

## 2024-01-20 DIAGNOSIS — I639 Cerebral infarction, unspecified: Secondary | ICD-10-CM | POA: Diagnosis not present

## 2024-01-20 NOTE — Patient Instructions (Signed)
 Below is our plan:  We will continue to monitor tremor, drooling and word finding difficulty. Let us  know if you start having any trouble swallowing. Please use your cane or walker for stability to prevent falls.   Goals:  1) Maintain strict control of hypertension with blood pressure goal below 130/90 2) Maintain good control of diabetes with hemoglobin A1c goal below 7%  3) Maintain good control of lipids with LDL cholesterol goal below 70 mg/dL.  4) Eat a healthy diet with plenty of whole grains, cereals, fruits and vegetables, exercise regularly and maintain ideal body weight   Resources: [email or web address].[email or web address]  Please make sure you are staying well hydrated. I recommend 50-60 ounces daily. Well balanced diet and regular exercise encouraged. Consistent sleep schedule with 6-8 hours recommended.   Please continue follow up with care team as directed.   Follow up with me in 6 months   You may receive a survey regarding today's visit. I encourage you to leave honest feed back as I do use this information to improve patient care. Thank you for seeing me today!

## 2024-01-27 ENCOUNTER — Ambulatory Visit (HOSPITAL_COMMUNITY)
Admission: RE | Admit: 2024-01-27 | Discharge: 2024-01-27 | Disposition: A | Discharge status: Home / Self Care | Source: Ambulatory Visit | Attending: Cardiology | Admitting: Cardiology

## 2024-01-27 DIAGNOSIS — K551 Chronic vascular disorders of intestine: Secondary | ICD-10-CM | POA: Insufficient documentation

## 2024-01-27 DIAGNOSIS — K5730 Diverticulosis of large intestine without perforation or abscess without bleeding: Secondary | ICD-10-CM | POA: Diagnosis not present

## 2024-01-27 DIAGNOSIS — K7689 Other specified diseases of liver: Secondary | ICD-10-CM | POA: Diagnosis not present

## 2024-01-27 DIAGNOSIS — Z90710 Acquired absence of both cervix and uterus: Secondary | ICD-10-CM | POA: Diagnosis not present

## 2024-01-27 MED ORDER — IOHEXOL 350 MG/ML SOLN
100.0000 mL | Freq: Once | INTRAVENOUS | Status: AC | PRN
  Administered 2024-01-27: 100 mL via INTRAVENOUS

## 2024-02-02 DIAGNOSIS — H40013 Open angle with borderline findings, low risk, bilateral: Secondary | ICD-10-CM | POA: Diagnosis not present

## 2024-02-08 ENCOUNTER — Other Ambulatory Visit: Payer: Self-pay

## 2024-02-08 ENCOUNTER — Ambulatory Visit: Attending: Surgery | Admitting: Surgery

## 2024-02-08 ENCOUNTER — Encounter: Payer: Self-pay | Admitting: Surgery

## 2024-02-08 VITALS — BP 115/64 | HR 68 | Temp 97.3°F

## 2024-02-08 DIAGNOSIS — K551 Chronic vascular disorders of intestine: Secondary | ICD-10-CM

## 2024-02-08 NOTE — H&P (View-Only) (Signed)
 [facility]  Patient name: Desiree Leon MRN: [id number] DOB: [birth year] Sex: female   REASON FOR VISIT:    Follow up  HISOTRY OF PRESENT ILLNESS:    Desiree Leon is a 83 y.o. female, who is referred for SMA stenosis which was detected on a CT scan.  The patient has been having abdominal pain for 2 years which is progressively getting worse.  She will occasionally have postprandial abdominal pain.  She has not had any significant weight loss.  She underwent angiography on 12/29/2023.  This was unable to be crossed as a dissection plane was encountered and the extent of the dissection could not be determined so the procedure was aborted.  She is back today to consider repeat intervention.  She has had no change in her symptoms   She suffers from COPD and is on oxygen .  She is medically managed for hypertension.  She is on a statin for hypercholesterolemia.  She is on anticoagulation.  She has a history of coronary artery disease but has not had a stent.   PAST MEDICAL HISTORY:   Past Medical History:  Diagnosis Date   Anxiety    Blood transfusion    CAP (community acquired pneumonia)    COPD (chronic obstructive pulmonary disease) (HCC)    Heart murmur    History of colon polyps    Hyperlipidemia    Hypertension    MI (myocardial infarction) (HCC)    Mitral valve prolapse    NSTEMI (non-ST elevated myocardial infarction) (HCC)    Osteoarthritis    Osteoporosis    Pulmonary emboli (HCC) 04/12/2019   Spinal stenosis    Thyroid  disease    hypothyroidism   Unstable angina (HCC) 05/08/2014   Venous insufficiency      FAMILY HISTORY:   Family History  Problem Relation Age of Onset   Heart disease Mother    Heart disease Father    Cancer Sister        breast   Cancer Daughter        cervical and bone   Heart disease Sister    Thyroid  disease Sister     SOCIAL HISTORY:   Social History   Tobacco  Use   Smoking status: Former    Current packs/day: 0.00    Average packs/day: 0.5 packs/day for 30.0 years (15.0 ttl pk-yrs)    Types: Cigarettes    Start date: 03/11/1975    Quit date: 03/10/2005    Years since quitting: 18.9   Smokeless tobacco: Never  Substance Use Topics   Alcohol  use: No     ALLERGIES:   Allergies  Allergen Reactions   Other Anaphylaxis and Cough    fragarances-perfumes   Dilaudid [Hydromorphone Hcl] Nausea Only   Erythromycin Nausea And Vomiting   Diflucan [Fluconazole] Palpitations and Hypertension    When taken with Nicardipine , reaction happens.     CURRENT MEDICATIONS:   Current Outpatient Medications  Medication Sig Dispense Refill   acetaminophen  (TYLENOL ) 500 MG tablet Take 500 mg by mouth daily as needed for fever or headache.     albuterol  (VENTOLIN  HFA) 108 (90 Base) MCG/ACT inhaler Inhale 1-2 puffs into the lungs every 6 (six) hours as needed for wheezing or shortness of breath. 8 g 11   ALPRAZolam  (XANAX ) 0.25 MG tablet Take 0.25 mg by mouth daily as needed for anxiety.     apixaban  (ELIQUIS ) 2.5 MG TABS tablet Take 1 tablet (2.5 mg total) by  mouth 2 (two) times daily. Resume on 4/8 evening 180 tablet 3   budesonide -formoterol  (SYMBICORT ) 160-4.5 MCG/ACT inhaler Inhale 2 puffs into the lungs 2 (two) times daily. 1 each 12   Cholecalciferol  (VITAMIN D3) 50 MCG (2000 UT) capsule Take 2,000 Units by mouth daily.     escitalopram  (LEXAPRO ) 5 MG tablet Take 5 mg by mouth every evening.      ezetimibe  (ZETIA ) 10 MG tablet Take 10 mg by mouth daily.     furosemide  (LASIX ) 20 MG tablet Take 20 mg by mouth daily as needed for fluid or edema. (Patient taking differently: Take 20 mg by mouth as needed for fluid or edema.)     glucose blood test strip Accu-Chek SmartView Test Strips  CHECK BLOOD SUGAR TWICE DAILY.     levothyroxine  (SYNTHROID ) 137 MCG tablet Take 137 mcg by mouth every morning.     metoprolol  tartrate (LOPRESSOR ) 25 MG tablet Take 0.5  tablets (12.5 mg total) by mouth 2 (two) times daily. 90 tablet 3   multivitamin-iron -minerals-folic acid (CENTRUM) chewable tablet Chew 1 tablet by mouth daily.     niCARdipine  (CARDENE ) 20 MG capsule Take 20 mg by mouth at bedtime.     nitroGLYCERIN  (NITRODUR - DOSED IN MG/24 HR) 0.4 mg/hr patch Place 0.4 mg onto the skin daily.     nitroGLYCERIN  (NITROSTAT ) 0.4 MG SL tablet Place 1 tablet (0.4 mg total) under the tongue every 5 (five) minutes as needed for chest pain. 25 tablet 11   olopatadine (PATADAY) 0.1 % ophthalmic solution Place 1 drop into both eyes daily. 24 hr     OXYGEN  Inhale 2.5 L into the lungs continuous.     pantoprazole  (PROTONIX ) 40 MG tablet Take 1 tablet (40 mg total) by mouth daily at 12 noon. (Patient taking differently: Take 40 mg by mouth 2 (two) times daily.) 30 tablet 1   Probiotic Product (ALIGN) 4 MG CAPS Take 4 mg by mouth daily.     ranolazine  (RANEXA ) 500 MG 12 hr tablet TAKE ONE TABLET BY MOUTH TWICE DAILY 180 tablet 3   rosuvastatin (CRESTOR) 40 MG tablet Take 40 mg by mouth every evening.     triamcinolone cream (KENALOG) 0.1 % Apply 1 Application topically daily. On both legs.With vitamin E oil     No current facility-administered medications for this visit.    REVIEW OF SYSTEMS:   [X]  denotes positive finding, [ ]  denotes negative finding Cardiac  Comments:  Chest pain or chest pressure:    Shortness of breath upon exertion:    Short of breath when lying flat:    Irregular heart rhythm:        Vascular    Pain in calf, thigh, or hip brought on by ambulation:    Pain in feet at night that wakes you up from your sleep:     Blood clot in your veins:    Leg swelling:         Pulmonary    Oxygen  at home:    Productive cough:     Wheezing:         Neurologic    Sudden weakness in arms or legs:     Sudden numbness in arms or legs:     Sudden onset of difficulty speaking or slurred speech:    Temporary loss of vision in one eye:     Problems  with dizziness:         Gastrointestinal    Blood in stool:  Vomited blood:         Genitourinary    Burning when urinating:     Blood in urine:        Psychiatric    Major depression:         Hematologic    Bleeding problems:    Problems with blood clotting too easily:        Skin    Rashes or ulcers:        Constitutional    Fever or chills:      PHYSICAL EXAM:   Vitals:   02/08/24 0945  BP: 115/64  Pulse: 68  Temp: (!) 97.3 F (36.3 C)  SpO2: 97%    GENERAL: The patient is a well-nourished female, in no acute distress. The vital signs are documented above. CARDIAC: There is a regular rate and rhythm. PULMONARY: Non-labored respirations ABDOMEN: Soft and non-tender   MUSCULOSKELETAL: There are no major deformities or cyanosis. NEUROLOGIC: No focal weakness or paresthesias are detected. SKIN: There are no ulcers or rashes noted. PSYCHIATRIC: The patient has a normal affect.  STUDIES:   I have reviewed the following CTA: 1. There is been interval SMA intervention with recanalization of a proximal SMA lumen estimated at 1.5 mm in diameter. A dissection is present in the proximal/mid segment which is annotated on representative images.    MEDICAL ISSUES:   Mesenteric stenosis: The patient had a failed attempt at recanalization as a dissection plane was created.  She had a CT scan that shows that the dissection plane has evidence of healing although it does still persist.  I recommended proceeding with repeat intervention coming from the left arm.  She wants to proceed.  She will need to be off her Eliquis  again.  She is also having trouble with what sounds like constipation and diarrhea.  I told her this would likely not have any impact on that    [name], IV, MD, FACS [facility] [phone] Pager [phone]

## 2024-02-08 NOTE — Progress Notes (Signed)
 [facility] [place]  Patient name: Desiree Leon MRN: [id number] DOB: [birth year] Sex: female   REASON FOR VISIT:    Follow up  HISOTRY OF PRESENT ILLNESS:    Desiree Desiree Leon is a 83 y.o. female, who is referred for SMA stenosis which was detected on a CT scan.  The patient has been having abdominal pain for 2 years which is progressively getting worse.  She will occasionally have postprandial abdominal pain.  She has not had any significant weight loss.  She underwent angiography on 12/29/2023.  This was unable to be crossed as a dissection plane was encountered and the extent of the dissection could not be determined so the procedure was aborted.  She is back today to consider repeat intervention.  She has had no change in her symptoms   She suffers from COPD and is on oxygen .  She is medically managed for hypertension.  She is on a statin for hypercholesterolemia.  She is on anticoagulation.  She has a history of coronary artery disease but has not had a stent.   PAST MEDICAL HISTORY:   Past Medical History:  Diagnosis Date   Anxiety    Blood transfusion    CAP (community acquired pneumonia)    COPD (chronic obstructive pulmonary disease) (HCC)    Heart murmur    History of colon polyps    Hyperlipidemia    Hypertension    MI (myocardial infarction) (HCC)    Mitral valve prolapse    NSTEMI (non-ST elevated myocardial infarction) (HCC)    Osteoarthritis    Osteoporosis    Pulmonary emboli (HCC) 04/12/2019   Spinal stenosis    Thyroid  disease    hypothyroidism   Unstable angina (HCC) 05/08/2014   Venous insufficiency      FAMILY HISTORY:   Family History  Problem Relation Age of Onset   Heart disease Mother    Heart disease Father    Cancer Sister        breast   Cancer Daughter        cervical and bone   Heart disease Sister    Thyroid  disease Sister     SOCIAL HISTORY:   Social History   Tobacco  Use   Smoking status: Former    Current packs/day: 0.00    Average packs/day: 0.5 packs/day for 30.0 years (15.0 ttl pk-yrs)    Types: Cigarettes    Start date: 03/11/1975    Quit date: 03/10/2005    Years since quitting: 18.9   Smokeless tobacco: Never  Substance Use Topics   Alcohol  use: No     ALLERGIES:   Allergies  Allergen Reactions   Other Anaphylaxis and Cough    fragarances-perfumes   Dilaudid [Hydromorphone Hcl] Nausea Only   Erythromycin Nausea And Vomiting   Diflucan [Fluconazole] Palpitations and Hypertension    When taken with Nicardipine , reaction happens.     CURRENT MEDICATIONS:   Current Outpatient Medications  Medication Sig Dispense Refill   acetaminophen  (TYLENOL ) 500 MG tablet Take 500 mg by mouth daily as needed for fever or headache.     albuterol  (VENTOLIN  HFA) 108 (90 Base) MCG/ACT inhaler Inhale 1-2 puffs into the lungs every 6 (six) hours as needed for wheezing or shortness of breath. 8 g 11   ALPRAZolam  (XANAX ) 0.25 MG tablet Take 0.25 mg by mouth daily as needed for anxiety.     apixaban  (ELIQUIS ) 2.5 MG TABS tablet Take 1 tablet (2.5 mg total) by  mouth 2 (two) times daily. Resume on 4/8 evening 180 tablet 3   budesonide -formoterol  (SYMBICORT ) 160-4.5 MCG/ACT inhaler Inhale 2 puffs into the lungs 2 (two) times daily. 1 each 12   Cholecalciferol  (VITAMIN D3) 50 MCG (2000 UT) capsule Take 2,000 Units by mouth daily.     escitalopram  (LEXAPRO ) 5 MG tablet Take 5 mg by mouth every evening.      ezetimibe  (ZETIA ) 10 MG tablet Take 10 mg by mouth daily.     furosemide  (LASIX ) 20 MG tablet Take 20 mg by mouth daily as needed for fluid or edema. (Patient taking differently: Take 20 mg by mouth as needed for fluid or edema.)     glucose blood test strip Accu-Chek SmartView Test Strips  CHECK BLOOD SUGAR TWICE DAILY.     levothyroxine  (SYNTHROID ) 137 MCG tablet Take 137 mcg by mouth every morning.     metoprolol  tartrate (LOPRESSOR ) 25 MG tablet Take 0.5  tablets (12.5 mg total) by mouth 2 (two) times daily. 90 tablet 3   multivitamin-iron -minerals-folic acid (CENTRUM) chewable tablet Chew 1 tablet by mouth daily.     niCARdipine  (CARDENE ) 20 MG capsule Take 20 mg by mouth at bedtime.     nitroGLYCERIN  (NITRODUR - DOSED IN MG/24 HR) 0.4 mg/hr patch Place 0.4 mg onto the skin daily.     nitroGLYCERIN  (NITROSTAT ) 0.4 MG SL tablet Place 1 tablet (0.4 mg total) under the tongue every 5 (five) minutes as needed for chest pain. 25 tablet 11   olopatadine (PATADAY) 0.1 % ophthalmic solution Place 1 drop into both eyes daily. 24 hr     OXYGEN  Inhale 2.5 L into the lungs continuous.     pantoprazole  (PROTONIX ) 40 MG tablet Take 1 tablet (40 mg total) by mouth daily at 12 noon. (Patient taking differently: Take 40 mg by mouth 2 (two) times daily.) 30 tablet 1   Probiotic Product (ALIGN) 4 MG CAPS Take 4 mg by mouth daily.     ranolazine  (RANEXA ) 500 MG 12 hr tablet TAKE ONE TABLET BY MOUTH TWICE DAILY 180 tablet 3   rosuvastatin (CRESTOR) 40 MG tablet Take 40 mg by mouth every evening.     triamcinolone cream (KENALOG) 0.1 % Apply 1 Application topically daily. On both legs.With vitamin E oil     No current facility-administered medications for this visit.    REVIEW OF SYSTEMS:   [X]  denotes positive finding, [ ]  denotes negative finding Cardiac  Comments:  Chest pain or chest pressure:    Shortness of breath upon exertion:    Short of breath when lying flat:    Irregular heart rhythm:        Vascular    Pain in calf, thigh, or hip brought on by ambulation:    Pain in feet at night that wakes you up from your sleep:     Blood clot in your veins:    Leg swelling:         Pulmonary    Oxygen  at home:    Productive cough:     Wheezing:         Neurologic    Sudden weakness in arms or legs:     Sudden numbness in arms or legs:     Sudden onset of difficulty speaking or slurred speech:    Temporary loss of vision in one eye:     Problems  with dizziness:         Gastrointestinal    Blood in stool:  Vomited blood:         Genitourinary    Burning when urinating:     Blood in urine:        Psychiatric    Major depression:         Hematologic    Bleeding problems:    Problems with blood clotting too easily:        Skin    Rashes or ulcers:        Constitutional    Fever or chills:      PHYSICAL EXAM:   Vitals:   02/08/24 0945  BP: 115/64  Pulse: 68  Temp: (!) 97.3 F (36.3 C)  SpO2: 97%    GENERAL: The patient is a well-nourished female, in no acute distress. The vital signs are documented above. CARDIAC: There is a regular rate and rhythm. PULMONARY: Non-labored respirations ABDOMEN: Soft and non-tender   MUSCULOSKELETAL: There are no major deformities or cyanosis. NEUROLOGIC: No focal weakness or paresthesias are detected. SKIN: There are no ulcers or rashes noted. PSYCHIATRIC: The patient has a normal affect.  STUDIES:   I have reviewed the following CTA: 1. There is been interval SMA intervention with recanalization of a proximal SMA lumen estimated at 1.5 mm in diameter. A dissection is present in the proximal/mid segment which is annotated on representative images.    MEDICAL ISSUES:   Mesenteric stenosis: The patient had a failed attempt at recanalization as a dissection plane was created.  She had a CT scan that shows that the dissection plane has evidence of healing although it does still persist.  I recommended proceeding with repeat intervention coming from the left arm.  She wants to proceed.  She will need to be off her Eliquis  again.  She is also having trouble with what sounds like constipation and diarrhea.  I told her this would likely not have any impact on that    [name], IV, MD, FACS [facility] [phone] Pager [phone]

## 2024-02-16 ENCOUNTER — Encounter (HOSPITAL_COMMUNITY): Admission: RE | Disposition: A | Payer: Self-pay | Attending: Surgery

## 2024-02-16 ENCOUNTER — Other Ambulatory Visit: Payer: Self-pay

## 2024-02-16 ENCOUNTER — Ambulatory Visit (HOSPITAL_COMMUNITY): Admission: RE | Admit: 2024-02-16 | Discharge: 2024-02-16 | Disposition: A | Attending: Surgery | Admitting: Surgery

## 2024-02-16 DIAGNOSIS — I7779 Dissection of other artery: Secondary | ICD-10-CM | POA: Diagnosis not present

## 2024-02-16 DIAGNOSIS — K551 Chronic vascular disorders of intestine: Secondary | ICD-10-CM | POA: Diagnosis not present

## 2024-02-16 HISTORY — PX: ABDOMINAL AORTOGRAM: CATH118222

## 2024-02-16 HISTORY — PX: VISCERAL ANGIOGRAPHY: CATH118276

## 2024-02-16 HISTORY — PX: VISCERAL ARTERY INTERVENTION: CATH118277

## 2024-02-16 LAB — POCT I-STAT, CHEM 8
BUN: 23 mg/dL (ref 8–23)
Calcium, Ion: 1.12 mmol/L — ABNORMAL LOW (ref 1.15–1.40)
Chloride: 105 mmol/L (ref 98–111)
Creatinine, Ser: 0.7 mg/dL (ref 0.44–1.00)
Glucose, Bld: 141 mg/dL — ABNORMAL HIGH (ref 70–99)
HCT: 30 % — ABNORMAL LOW (ref 36.0–46.0)
Hemoglobin: 10.2 g/dL — ABNORMAL LOW (ref 12.0–15.0)
Potassium: 5.5 mmol/L — ABNORMAL HIGH (ref 3.5–5.1)
Sodium: 140 mmol/L (ref 135–145)
TCO2: 26 mmol/L (ref 22–32)

## 2024-02-16 LAB — POCT ACTIVATED CLOTTING TIME
Activated Clotting Time: 199 s
Activated Clotting Time: 174 s

## 2024-02-16 MED ORDER — CLOPIDOGREL BISULFATE 75 MG PO TABS: 75.0000 mg | ORAL_TABLET | Freq: Every day | ORAL | 11 refills | Status: DC

## 2024-02-16 MED ORDER — FENTANYL CITRATE (PF) 100 MCG/2ML IJ SOLN
INTRAMUSCULAR | Status: DC | PRN
  Administered 2024-02-16: 50 ug via INTRAVENOUS
  Administered 2024-02-16: 25 ug via INTRAVENOUS

## 2024-02-16 MED ORDER — NITROGLYCERIN 1 MG/10 ML FOR IR/CATH LAB
INTRA_ARTERIAL | Status: DC | PRN
  Administered 2024-02-16: 200 ug via INTRA_ARTERIAL

## 2024-02-16 MED ORDER — MIDAZOLAM HCL (PF) 2 MG/2ML IJ SOLN
INTRAMUSCULAR | Status: DC | PRN
  Administered 2024-02-16 (×2): 1 mg via INTRAVENOUS

## 2024-02-16 MED ORDER — LIDOCAINE HCL (PF) 1 % IJ SOLN
INTRAMUSCULAR | Status: DC | PRN
  Administered 2024-02-16: 5 mL

## 2024-02-16 MED ORDER — ROSUVASTATIN CALCIUM 5 MG PO TABS: 5.0000 mg | ORAL_TABLET | Freq: Every day | ORAL | 11 refills | Status: DC

## 2024-02-16 MED ORDER — HYDRALAZINE HCL 20 MG/ML IJ SOLN: 5.0000 mg | INTRAMUSCULAR | Status: DC | PRN

## 2024-02-16 MED ORDER — HEPARIN (PORCINE) IN NACL 1000-0.9 UT/500ML-% IV SOLN
INTRAVENOUS | Status: DC | PRN
  Administered 2024-02-16 (×2): 500 mL

## 2024-02-16 MED ORDER — ONDANSETRON HCL 4 MG/2ML IJ SOLN: 4.0000 mg | Freq: Four times a day (QID) | INTRAMUSCULAR | Status: DC | PRN

## 2024-02-16 MED ORDER — ACETAMINOPHEN 325 MG PO TABS: 650.0000 mg | ORAL_TABLET | ORAL | Status: DC | PRN

## 2024-02-16 MED ORDER — LIDOCAINE HCL (PF) 1 % IJ SOLN
INTRAMUSCULAR | Status: AC
  Filled 2024-02-16: qty 30

## 2024-02-16 MED ORDER — SODIUM CHLORIDE 0.9 % WEIGHT BASED INFUSION
1.0000 mL/kg/h | INTRAVENOUS | Status: DC
  Administered 2024-02-16: 1 mL/kg/h via INTRAVENOUS

## 2024-02-16 MED ORDER — ASPIRIN 81 MG PO TBEC: 81.0000 mg | DELAYED_RELEASE_TABLET | Freq: Every day | ORAL | Status: DC

## 2024-02-16 MED ORDER — CLOPIDOGREL BISULFATE 300 MG PO TABS
ORAL_TABLET | ORAL | Status: DC | PRN
  Administered 2024-02-16: 300 mg via ORAL

## 2024-02-16 MED ORDER — FENTANYL CITRATE (PF) 100 MCG/2ML IJ SOLN
INTRAMUSCULAR | Status: AC
  Filled 2024-02-16: qty 2

## 2024-02-16 MED ORDER — ASPIRIN 81 MG PO CHEW
CHEWABLE_TABLET | ORAL | Status: DC | PRN
  Administered 2024-02-16: 81 mg via ORAL

## 2024-02-16 MED ORDER — SODIUM CHLORIDE 0.9% FLUSH: 3.0000 mL | INTRAVENOUS | Status: DC | PRN

## 2024-02-16 MED ORDER — ASPIRIN 81 MG PO TBEC: 81.0000 mg | DELAYED_RELEASE_TABLET | Freq: Every day | ORAL | 2 refills | Status: AC

## 2024-02-16 MED ORDER — CLOPIDOGREL BISULFATE 300 MG PO TABS
ORAL_TABLET | ORAL | Status: AC
  Filled 2024-02-16: qty 1

## 2024-02-16 MED ORDER — OXYCODONE HCL 5 MG PO TABS: 5.0000 mg | ORAL_TABLET | ORAL | Status: DC | PRN

## 2024-02-16 MED ORDER — CLOPIDOGREL BISULFATE 75 MG PO TABS: 75.0000 mg | ORAL_TABLET | Freq: Every day | ORAL | Status: DC

## 2024-02-16 MED ORDER — SODIUM CHLORIDE 0.9 % IV SOLN: 250.0000 mL | INTRAVENOUS | Status: DC | PRN

## 2024-02-16 MED ORDER — LABETALOL HCL 5 MG/ML IV SOLN
10.0000 mg | INTRAVENOUS | Status: DC | PRN
  Administered 2024-02-16: 10 mg via INTRAVENOUS
  Filled 2024-02-16: qty 4

## 2024-02-16 MED ORDER — SODIUM CHLORIDE 0.9 % IV SOLN: INTRAVENOUS | Status: DC

## 2024-02-16 MED ORDER — MORPHINE SULFATE (PF) 2 MG/ML IV SOLN: 2.0000 mg | INTRAVENOUS | Status: DC | PRN

## 2024-02-16 MED ORDER — SODIUM CHLORIDE 0.9% FLUSH: 3.0000 mL | Freq: Two times a day (BID) | INTRAVENOUS | Status: DC

## 2024-02-16 MED ORDER — HEPARIN SODIUM (PORCINE) 1000 UNIT/ML IJ SOLN
INTRAMUSCULAR | Status: AC
  Filled 2024-02-16: qty 10

## 2024-02-16 MED ORDER — ASPIRIN 81 MG PO CHEW
CHEWABLE_TABLET | ORAL | Status: AC
  Filled 2024-02-16: qty 1

## 2024-02-16 MED ORDER — HEPARIN SODIUM (PORCINE) 1000 UNIT/ML IJ SOLN
INTRAMUSCULAR | Status: DC | PRN
  Administered 2024-02-16: 2000 [IU] via INTRAVENOUS
  Administered 2024-02-16: 6000 [IU] via INTRAVENOUS

## 2024-02-16 MED ORDER — MIDAZOLAM HCL 2 MG/2ML IJ SOLN
INTRAMUSCULAR | Status: AC
  Filled 2024-02-16: qty 2

## 2024-02-16 MED ORDER — NITROGLYCERIN 1 MG/10 ML FOR IR/CATH LAB
INTRA_ARTERIAL | Status: DC
  Filled 2024-02-16: qty 10

## 2024-02-16 NOTE — Discharge Instructions (Signed)
 Brachial Site Care This sheet gives you information about how to care for yourself after your procedure. Your health care provider may also give you more specific instructions. If you have problems or questions, contact your health care provider. What can I expect after the procedure?  After the procedure, it is common to have: Bruising that usually fades within 1-2 weeks. Tenderness at the site. Follow these instructions at home: Wound care Follow instructions from your health care provider about how to take care of your insertion site. Make sure you: Wash your hands with soap and water before you change your bandage (dressing). If soap and water are not available, use hand sanitizer. Remove your dressing as told by your health care provider. 24 hours Do not take baths, swim, or use a hot tub until your health care provider approves. You may shower 24-48 hours after the procedure or as told by your health care provider. Gently wash the site with plain soap and water. Pat the area dry with a clean towel. Do not rub the site. This may cause bleeding. Do not apply powder or lotion to the site. Keep the site clean and dry. Check your femoral site every day for signs of infection. Check for: Redness, swelling, or pain. Fluid or blood. Warmth. Pus or a bad smell. Activity For the first 2-3 days after your procedure, or as long as directed: Avoid climbing stairs as much as possible. Do not lift anything that is heavier than 10 lb (4.5 kg), or the limit that you are told, until your health care provider says that it is safe. For 5 days Rest as directed. Avoid sitting for a long time without moving. Get up to take short walks every 1-2 hours. Do not drive for 24 hours if you were given a medicine to help you relax (sedative). General instructions Take over-the-counter and prescription medicines only as told by your health care provider. Keep all follow-up visits as told by your health care  provider. This is important. Contact a health care provider if you have: A fever or chills. You have redness, swelling, or pain around your insertion site. Get help right away if: The catheter insertion area swells very fast. You pass out. You suddenly start to sweat or your skin gets clammy. The catheter insertion area is bleeding, and the bleeding does not stop when you hold steady pressure on the area. The area near or just beyond the catheter insertion site becomes pale, cool, tingly, or numb. These symptoms may represent a serious problem that is an emergency. Do not wait to see if the symptoms will go away. Get medical help right away. Call your local emergency services (911 in the [place] Do not drive yourself to the hospital. Summary After the procedure, it is common to have bruising that usually fades within 1-2 weeks. Check your femoral site every day for signs of infection. Do not lift anything that is heavier than 10 lb (4.5 kg), or the limit that you are told, until your health care provider says that it is safe. This information is not intended to replace advice given to you by your health care provider. Make sure you discuss any questions you have with your health care provider. Document Revised: 03/09/2017 Document Reviewed: 03/09/2017 Elsevier Patient Education  2020 [employer].

## 2024-02-16 NOTE — Progress Notes (Signed)
 47fr sheath removed intact from left brachial artery. Manual pressure applied x . No bleeding or hematoma noted. 4x4 gauze/tegaderm dressing applied to site. Post activity and precautions explained. Arm board place to left arm as an immobilizer until bedrest complete. Will continue to monitor per orders and protocol.Desiree Leon

## 2024-02-16 NOTE — Progress Notes (Signed)
 Pt and daughter received discharge instructions, teach back performed. Iv removed, no complications. Left brachial site is clean dry intact, site is soft, no signs of bleeding. Pt escorted out via wheelchair to daughter's vehicle.

## 2024-02-16 NOTE — Interval H&P Note (Signed)
 History and Physical Interval Note:  02/16/2024 8:44 AM  Desiree Leon  has presented today for surgery, with the diagnosis of mesenteric stenosis.  The various methods of treatment have been discussed with the patient and family. After consideration of risks, benefits and other options for treatment, the patient has consented to  Procedure(s): ABDOMINAL AORTOGRAM (N/A) as a surgical intervention.  The patient's history has been reviewed, patient examined, no change in status, stable for surgery.  I have reviewed the patient's chart and labs.  Questions were answered to the patient's satisfaction.     [name]

## 2024-02-16 NOTE — Op Note (Signed)
    Patient name: Desiree Leon MRN: [id number] DOB: 10/13/1940 Sex: female  02/16/2024 Pre-operative Diagnosis: Mesenteric stenosis Post-operative diagnosis:  Same Surgeon:  [name] Procedure Performed:  1.  Ultrasound-guided access, left brachial artery  2.  Abdominal aortogram  3.  Stent, superior mesenteric artery  4.  Conscious sedation, 61 minutes   Indications: This is an 83 year old female with chronic mesenteric ischemia.  I attempted to cross her SMA several weeks ago but ended up in a dissection plane and so I had to abort.  I brought her back to the office with a CT scan that shows a persistent dissection that begins at the level of the first branch however she still has persistent ostial stenosis which we talked about treating today.  Procedure:  The patient was identified in the holding area and taken to room 8.  The patient was then placed supine on the table and prepped and draped in the usual sterile fashion.  A time out was called.  Conscious sedation was administered with the use of IV fentanyl  and Versed  under continuous physician and nurse monitoring.  Heart rate, blood pressure, and oxygen  saturation were continuously monitored.  Total sedation time was 61 minutes.  Ultrasound was used to evaluate the left brachial artery which was widely patent and easily compressible.  1% lidocaine  was used for local anesthesia.  Left brachial artery was cannulated under ultrasound guidance with a micropuncture needle.  A 018 wire was inserted followed by placement of micropuncture sheath.  Next a Bentson wire was placed and a 6 French sheath was inserted.  Using a Omni Flush catheter, a Glidewire advantage was directed into the descending thoracic aorta.  Next a pigtail catheter was inserted to the level of T12 and an abdominal aortogram was performed which identified the visceral vessels in the SMA stenosis which was approximately 70%.  Next, I upsized to a 6 x 90 sheath.  The patient  was fully heparinized.  Using a Berenstein 2 catheter and a V-18 wire, the superior mesenteric artery was selected.  Lesion was primarily stented with a Gore VBX 6 x 19.  Completion imaging showed resolution of the stenosis.  I exchanged out for a short 6 French sheath and the patient will be taken the holding her for sheath pull once her coagulation profile corrects.   Impression:  #1  Successful stenting of ostial supra mesenteric artery stenosis using a 6 x 19 VBX from the left brachial approach  #2: The patient will require triple therapy for 1 month and then go back to Eliquis  alone    V. [name], M.D., [facility] [place] Office: [phone] Pager:  [phone]

## 2024-02-17 ENCOUNTER — Encounter (HOSPITAL_COMMUNITY): Payer: Self-pay | Admitting: Surgery

## 2024-02-25 ENCOUNTER — Other Ambulatory Visit: Payer: Self-pay | Admitting: *Deleted

## 2024-02-25 DIAGNOSIS — K551 Chronic vascular disorders of intestine: Secondary | ICD-10-CM

## 2024-03-04 ENCOUNTER — Other Ambulatory Visit: Payer: Self-pay | Admitting: Cardiology

## 2024-03-04 DIAGNOSIS — Z79899 Other long term (current) drug therapy: Secondary | ICD-10-CM

## 2024-03-04 DIAGNOSIS — Z86711 Personal history of pulmonary embolism: Secondary | ICD-10-CM

## 2024-03-04 NOTE — Telephone Encounter (Signed)
 Prescription refill request for Eliquis  received. Indication:pe Last office visit:5/25 Scr: 0.70  12/25 Age:83 Weight:85.3  kg  Prescription refilled

## 2024-03-09 ENCOUNTER — Telehealth: Payer: Self-pay

## 2024-03-09 ENCOUNTER — Encounter (HOSPITAL_COMMUNITY): Payer: Self-pay

## 2024-03-09 ENCOUNTER — Inpatient Hospital Stay (HOSPITAL_COMMUNITY)
Admission: EM | Admit: 2024-03-09 | Discharge: 2024-03-15 | DRG: 378 | Disposition: A | Discharge status: Home / Self Care | Attending: Family Medicine | Admitting: Family Medicine

## 2024-03-09 ENCOUNTER — Other Ambulatory Visit: Payer: Self-pay

## 2024-03-09 DIAGNOSIS — K5790 Diverticulosis of intestine, part unspecified, without perforation or abscess without bleeding: Secondary | ICD-10-CM | POA: Insufficient documentation

## 2024-03-09 DIAGNOSIS — K224 Dyskinesia of esophagus: Secondary | ICD-10-CM | POA: Insufficient documentation

## 2024-03-09 DIAGNOSIS — Z7902 Long term (current) use of antithrombotics/antiplatelets: Secondary | ICD-10-CM

## 2024-03-09 DIAGNOSIS — N1831 Chronic kidney disease, stage 3a: Secondary | ICD-10-CM | POA: Diagnosis not present

## 2024-03-09 DIAGNOSIS — I2510 Atherosclerotic heart disease of native coronary artery without angina pectoris: Secondary | ICD-10-CM | POA: Diagnosis present

## 2024-03-09 DIAGNOSIS — J449 Chronic obstructive pulmonary disease, unspecified: Secondary | ICD-10-CM | POA: Diagnosis present

## 2024-03-09 DIAGNOSIS — E039 Hypothyroidism, unspecified: Secondary | ICD-10-CM | POA: Diagnosis present

## 2024-03-09 DIAGNOSIS — D62 Acute posthemorrhagic anemia: Secondary | ICD-10-CM | POA: Diagnosis present

## 2024-03-09 DIAGNOSIS — I130 Hypertensive heart and chronic kidney disease with heart failure and stage 1 through stage 4 chronic kidney disease, or unspecified chronic kidney disease: Secondary | ICD-10-CM | POA: Diagnosis present

## 2024-03-09 DIAGNOSIS — Z85828 Personal history of other malignant neoplasm of skin: Secondary | ICD-10-CM

## 2024-03-09 DIAGNOSIS — J9610 Chronic respiratory failure, unspecified whether with hypoxia or hypercapnia: Secondary | ICD-10-CM | POA: Insufficient documentation

## 2024-03-09 DIAGNOSIS — I6529 Occlusion and stenosis of unspecified carotid artery: Secondary | ICD-10-CM | POA: Diagnosis present

## 2024-03-09 DIAGNOSIS — E785 Hyperlipidemia, unspecified: Secondary | ICD-10-CM | POA: Diagnosis present

## 2024-03-09 DIAGNOSIS — K625 Hemorrhage of anus and rectum: Principal | ICD-10-CM

## 2024-03-09 DIAGNOSIS — I10 Essential (primary) hypertension: Secondary | ICD-10-CM | POA: Diagnosis present

## 2024-03-09 DIAGNOSIS — K529 Noninfective gastroenteritis and colitis, unspecified: Secondary | ICD-10-CM | POA: Diagnosis present

## 2024-03-09 DIAGNOSIS — K5730 Diverticulosis of large intestine without perforation or abscess without bleeding: Secondary | ICD-10-CM

## 2024-03-09 DIAGNOSIS — K5731 Diverticulosis of large intestine without perforation or abscess with bleeding: Secondary | ICD-10-CM | POA: Diagnosis present

## 2024-03-09 DIAGNOSIS — K640 First degree hemorrhoids: Secondary | ICD-10-CM | POA: Diagnosis present

## 2024-03-09 DIAGNOSIS — Z860100 Personal history of colon polyps, unspecified: Secondary | ICD-10-CM

## 2024-03-09 DIAGNOSIS — E669 Obesity, unspecified: Secondary | ICD-10-CM | POA: Diagnosis present

## 2024-03-09 DIAGNOSIS — K2981 Duodenitis with bleeding: Secondary | ICD-10-CM | POA: Diagnosis present

## 2024-03-09 DIAGNOSIS — Z90710 Acquired absence of both cervix and uterus: Secondary | ICD-10-CM

## 2024-03-09 DIAGNOSIS — Z79899 Other long term (current) drug therapy: Secondary | ICD-10-CM

## 2024-03-09 DIAGNOSIS — J9611 Chronic respiratory failure with hypoxia: Secondary | ICD-10-CM | POA: Diagnosis present

## 2024-03-09 DIAGNOSIS — Z9981 Dependence on supplemental oxygen: Secondary | ICD-10-CM

## 2024-03-09 DIAGNOSIS — K219 Gastro-esophageal reflux disease without esophagitis: Secondary | ICD-10-CM | POA: Diagnosis present

## 2024-03-09 DIAGNOSIS — F419 Anxiety disorder, unspecified: Secondary | ICD-10-CM | POA: Diagnosis not present

## 2024-03-09 DIAGNOSIS — J9811 Atelectasis: Secondary | ICD-10-CM | POA: Diagnosis not present

## 2024-03-09 DIAGNOSIS — N182 Chronic kidney disease, stage 2 (mild): Secondary | ICD-10-CM | POA: Diagnosis present

## 2024-03-09 DIAGNOSIS — Z8673 Personal history of transient ischemic attack (TIA), and cerebral infarction without residual deficits: Secondary | ICD-10-CM

## 2024-03-09 DIAGNOSIS — Z7982 Long term (current) use of aspirin: Secondary | ICD-10-CM

## 2024-03-09 DIAGNOSIS — I252 Old myocardial infarction: Secondary | ICD-10-CM

## 2024-03-09 DIAGNOSIS — Z8719 Personal history of other diseases of the digestive system: Secondary | ICD-10-CM

## 2024-03-09 DIAGNOSIS — K551 Chronic vascular disorders of intestine: Secondary | ICD-10-CM | POA: Diagnosis present

## 2024-03-09 DIAGNOSIS — D649 Anemia, unspecified: Secondary | ICD-10-CM | POA: Diagnosis present

## 2024-03-09 DIAGNOSIS — I5031 Acute diastolic (congestive) heart failure: Secondary | ICD-10-CM | POA: Insufficient documentation

## 2024-03-09 DIAGNOSIS — Z79890 Hormone replacement therapy (postmenopausal): Secondary | ICD-10-CM

## 2024-03-09 DIAGNOSIS — K922 Gastrointestinal hemorrhage, unspecified: Secondary | ICD-10-CM | POA: Diagnosis not present

## 2024-03-09 DIAGNOSIS — K5521 Angiodysplasia of colon with hemorrhage: Principal | ICD-10-CM | POA: Diagnosis present

## 2024-03-09 DIAGNOSIS — Z8249 Family history of ischemic heart disease and other diseases of the circulatory system: Secondary | ICD-10-CM

## 2024-03-09 DIAGNOSIS — Z7951 Long term (current) use of inhaled steroids: Secondary | ICD-10-CM

## 2024-03-09 DIAGNOSIS — Z87891 Personal history of nicotine dependence: Secondary | ICD-10-CM

## 2024-03-09 DIAGNOSIS — Z604 Social exclusion and rejection: Secondary | ICD-10-CM | POA: Diagnosis present

## 2024-03-09 DIAGNOSIS — Z8349 Family history of other endocrine, nutritional and metabolic diseases: Secondary | ICD-10-CM

## 2024-03-09 DIAGNOSIS — F32A Depression, unspecified: Secondary | ICD-10-CM | POA: Diagnosis present

## 2024-03-09 DIAGNOSIS — E1159 Type 2 diabetes mellitus with other circulatory complications: Secondary | ICD-10-CM | POA: Diagnosis present

## 2024-03-09 DIAGNOSIS — Z86711 Personal history of pulmonary embolism: Secondary | ICD-10-CM

## 2024-03-09 DIAGNOSIS — J441 Chronic obstructive pulmonary disease with (acute) exacerbation: Secondary | ICD-10-CM | POA: Diagnosis not present

## 2024-03-09 DIAGNOSIS — E1122 Type 2 diabetes mellitus with diabetic chronic kidney disease: Secondary | ICD-10-CM | POA: Diagnosis present

## 2024-03-09 DIAGNOSIS — E8770 Fluid overload, unspecified: Secondary | ICD-10-CM | POA: Diagnosis not present

## 2024-03-09 DIAGNOSIS — Z7901 Long term (current) use of anticoagulants: Secondary | ICD-10-CM

## 2024-03-09 DIAGNOSIS — I5042 Chronic combined systolic (congestive) and diastolic (congestive) heart failure: Secondary | ICD-10-CM | POA: Diagnosis present

## 2024-03-09 LAB — COMPREHENSIVE METABOLIC PANEL WITH GFR
ALT: 11 U/L (ref 0–44)
AST: 18 U/L (ref 15–41)
Albumin: 3.8 g/dL (ref 3.5–5.0)
Alkaline Phosphatase: 82 U/L (ref 38–126)
Anion gap: 12 (ref 5–15)
BUN: 15 mg/dL (ref 8–23)
CO2: 25 mmol/L (ref 22–32)
Calcium: 8.3 mg/dL — ABNORMAL LOW (ref 8.9–10.3)
Chloride: 103 mmol/L (ref 98–111)
Creatinine, Ser: 0.73 mg/dL (ref 0.44–1.00)
GFR, Estimated: >60 mL/min
Glucose, Bld: 144 mg/dL — ABNORMAL HIGH (ref 70–99)
Potassium: 3.2 mmol/L — ABNORMAL LOW (ref 3.5–5.1)
Sodium: 140 mmol/L (ref 135–145)
Total Bilirubin: 0.5 mg/dL (ref 0.0–1.2)
Total Protein: 6.1 g/dL — ABNORMAL LOW (ref 6.5–8.1)

## 2024-03-09 LAB — CBC
HCT: 23.4 % — ABNORMAL LOW (ref 36.0–46.0)
HCT: 23.6 % — ABNORMAL LOW (ref 36.0–46.0)
Hemoglobin: 7.4 g/dL — ABNORMAL LOW (ref 12.0–15.0)
Hemoglobin: 7.9 g/dL — ABNORMAL LOW (ref 12.0–15.0)
MCH: 29.8 pg (ref 26.0–34.0)
MCH: 30.4 pg (ref 26.0–34.0)
MCHC: 31.6 g/dL (ref 30.0–36.0)
MCHC: 33.5 g/dL (ref 30.0–36.0)
MCV: 94.4 fL (ref 80.0–100.0)
MCV: 90.8 fL (ref 80.0–100.0)
Platelets: 264 K/uL (ref 150–400)
Platelets: 254 K/uL (ref 150–400)
RBC: 2.48 MIL/uL — ABNORMAL LOW (ref 3.87–5.11)
RBC: 2.6 MIL/uL — ABNORMAL LOW (ref 3.87–5.11)
RDW: 14.9 % (ref 11.5–15.5)
RDW: 14.9 % (ref 11.5–15.5)
WBC: 6.6 K/uL (ref 4.0–10.5)
WBC: 6.9 K/uL (ref 4.0–10.5)
nRBC: 0 % (ref 0.0–0.2)
nRBC: 0 % (ref 0.0–0.2)

## 2024-03-09 LAB — PROTIME-INR
INR: 1.2 (ref 0.8–1.2)
Prothrombin Time: 15.7 s — ABNORMAL HIGH (ref 11.4–15.2)

## 2024-03-09 LAB — POC OCCULT BLOOD, ED: Fecal Occult Bld: POSITIVE — AB

## 2024-03-09 LAB — PREPARE RBC (CROSSMATCH)

## 2024-03-09 LAB — GLUCOSE, CAPILLARY: Glucose-Capillary: 191 mg/dL — ABNORMAL HIGH (ref 70–99)

## 2024-03-09 MED ORDER — ACETAMINOPHEN 650 MG RE SUPP: 650.0000 mg | Freq: Four times a day (QID) | RECTAL | Status: DC | PRN

## 2024-03-09 MED ORDER — ESCITALOPRAM OXALATE 10 MG PO TABS
5.0000 mg | ORAL_TABLET | Freq: Every evening | ORAL | Status: DC
  Administered 2024-03-09 – 2024-03-14 (×6): 5 mg via ORAL
  Filled 2024-03-09 (×6): qty 1

## 2024-03-09 MED ORDER — INSULIN ASPART 100 UNIT/ML IJ SOLN: 0.0000 [IU] | Freq: Three times a day (TID) | INTRAMUSCULAR | Status: DC

## 2024-03-09 MED ORDER — PANTOPRAZOLE SODIUM 40 MG IV SOLR
40.0000 mg | Freq: Two times a day (BID) | INTRAVENOUS | Status: DC
  Administered 2024-03-09 – 2024-03-13 (×9): 40 mg via INTRAVENOUS
  Filled 2024-03-09 (×9): qty 10

## 2024-03-09 MED ORDER — METOPROLOL TARTRATE 12.5 MG HALF TABLET
12.5000 mg | ORAL_TABLET | Freq: Two times a day (BID) | ORAL | Status: DC
  Administered 2024-03-09 – 2024-03-15 (×8): 12.5 mg via ORAL
  Filled 2024-03-09 (×9): qty 1

## 2024-03-09 MED ORDER — ACETAMINOPHEN 325 MG PO TABS: 650.0000 mg | ORAL_TABLET | Freq: Four times a day (QID) | ORAL | Status: DC | PRN

## 2024-03-09 MED ORDER — SODIUM CHLORIDE 0.9% FLUSH
3.0000 mL | Freq: Two times a day (BID) | INTRAVENOUS | Status: DC
  Administered 2024-03-09 – 2024-03-15 (×11): 3 mL via INTRAVENOUS

## 2024-03-09 MED ORDER — PANTOPRAZOLE SODIUM 40 MG IV SOLR
80.0000 mg | Freq: Once | INTRAVENOUS | Status: AC
  Administered 2024-03-09: 80 mg via INTRAVENOUS
  Filled 2024-03-09: qty 20

## 2024-03-09 MED ORDER — SODIUM CHLORIDE 0.9% IV SOLUTION: Freq: Once | INTRAVENOUS | Status: AC

## 2024-03-09 MED ORDER — POTASSIUM CHLORIDE 20 MEQ PO PACK
40.0000 meq | PACK | Freq: Once | ORAL | Status: AC
  Administered 2024-03-09: 40 meq via ORAL
  Filled 2024-03-09: qty 2

## 2024-03-09 MED ORDER — RANOLAZINE ER 500 MG PO TB12
500.0000 mg | ORAL_TABLET | Freq: Two times a day (BID) | ORAL | Status: DC
  Administered 2024-03-09 – 2024-03-15 (×11): 500 mg via ORAL
  Filled 2024-03-09 (×14): qty 1

## 2024-03-09 MED ORDER — FLUTICASONE FUROATE-VILANTEROL 200-25 MCG/ACT IN AEPB
1.0000 | INHALATION_SPRAY | Freq: Every day | RESPIRATORY_TRACT | Status: DC
  Administered 2024-03-11 – 2024-03-15 (×5): 1 via RESPIRATORY_TRACT
  Filled 2024-03-09 (×2): qty 28

## 2024-03-09 MED ORDER — ALBUTEROL SULFATE (2.5 MG/3ML) 0.083% IN NEBU
3.0000 mL | INHALATION_SOLUTION | Freq: Four times a day (QID) | RESPIRATORY_TRACT | Status: DC | PRN
  Administered 2024-03-10 – 2024-03-11 (×4): 3 mL via RESPIRATORY_TRACT
  Filled 2024-03-09 (×4): qty 3

## 2024-03-09 MED ORDER — LEVOTHYROXINE SODIUM 25 MCG PO TABS
137.0000 ug | ORAL_TABLET | ORAL | Status: DC
  Administered 2024-03-10 – 2024-03-15 (×6): 137 ug via ORAL
  Filled 2024-03-09 (×6): qty 1

## 2024-03-09 MED ORDER — EZETIMIBE 10 MG PO TABS
10.0000 mg | ORAL_TABLET | Freq: Every day | ORAL | Status: DC
  Administered 2024-03-10 – 2024-03-15 (×5): 10 mg via ORAL
  Filled 2024-03-09 (×5): qty 1

## 2024-03-09 MED ORDER — ROSUVASTATIN CALCIUM 20 MG PO TABS
40.0000 mg | ORAL_TABLET | Freq: Every evening | ORAL | Status: DC
  Administered 2024-03-09 – 2024-03-14 (×6): 40 mg via ORAL
  Filled 2024-03-09 (×6): qty 2

## 2024-03-09 MED ORDER — ASPIRIN 81 MG PO TBEC
81.0000 mg | DELAYED_RELEASE_TABLET | Freq: Every day | ORAL | Status: DC
  Administered 2024-03-10 – 2024-03-15 (×5): 81 mg via ORAL
  Filled 2024-03-09 (×5): qty 1

## 2024-03-09 NOTE — Consult Note (Signed)
 [facility] Gastroenterology Consult  Referring Provider: No ref. provider found Primary Care Physician:  Shepard Ade, MD Primary Gastroenterologist: [facility]  Reason for Consultation: Symptomatic anemia, fecal occult positive stool, melena, hematochezia  SUBJECTIVE:   HPI: Desiree Leon is a 83 y.o. female with past medical history significant for COPD on 3 L supplemental oxygen , coronary artery disease, history of CVA, chronic mesenteric ischemia with SMA stenosis status post SMA stenting 02/08/2024 (has subsequently been recommended to be on triple therapy Eliquis /aspirin /clopidogrel  for 1 month).  EGD 04/25/2022 (Dr. Burnette) showed normal esophagus, stomach, duodenum. Colonoscopy 04/26/2022 (Dr. 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  Patient noted that in the recent past she has been experiencing dark stools, she has intermittently been on iron  during this time.  She experiencing constipation symptoms with iron  administration, otherwise has history of chronic diarrhea.  She has been experiencing bilateral lower quadrant abdominal discomfort which has been increasing in size.  Palpation aggravates this pain.  No alleviating symptoms.  Abdominal pain not improved after SMA stenting.  She uses Tylenol  for pain, no NSAID use.  She has been experiencing worsening shortness of breath.  No chest pain.  She has been experiencing nausea with no vomiting.  This a.m., she had bright red blood per rectum which was new for her and she subsequently presented to the emergency department.  Hemoglobin 7.4 (was 10.2 on 02/16/2024), platelet 264, sodium 140, potassium 3.2, INR 1.2.  Past Medical History:  Diagnosis Date   Acute diastolic heart failure (HCC) 03/09/2024   Acute pulmonary embolism (HCC) 05/21/2021   Anxiety    Blood transfusion    CAP (community acquired pneumonia)     COPD (chronic obstructive pulmonary disease) (HCC)    Heart murmur    History of colon polyps    Hyperlipidemia    Hypertension    MI (myocardial infarction) (HCC)    Mitral valve prolapse    NSTEMI (non-ST elevated myocardial infarction) (HCC)    Osteoarthritis    Osteoporosis    Pulmonary emboli (HCC) 04/12/2019   Spinal stenosis    Thyroid  disease    hypothyroidism   Unstable angina (HCC) 05/08/2014   Venous insufficiency    Past Surgical History:  Procedure Laterality Date   ABDOMINAL AORTOGRAM N/A 02/16/2024   Procedure: ABDOMINAL AORTOGRAM;  Surgeon: Serene Gaile ORN, MD;  Location: [facility];  Service: Cardiovascular;  Laterality: N/A;   ABDOMINAL AORTOGRAM W/LOWER EXTREMITY N/A 12/29/2023   Procedure: ABDOMINAL AORTOGRAM W/LOWER EXTREMITY;  Surgeon: Serene Gaile ORN, MD;  Location: [facility];  Service: Cardiovascular;  Laterality: N/A;   ABDOMINAL HYSTERECTOMY  11/26/1976   APPENDECTOMY  02/14/1978   BASAL CELL CARCINOMA EXCISION  01/26/1990   COLONOSCOPY  1993, 2003, 2008, 2012   COLONOSCOPY WITH PROPOFOL  N/A 04/26/2022   Procedure: COLONOSCOPY WITH PROPOFOL ;  Surgeon: Elicia Claw, MD;  Location: [facility] ENDOSCOPY;  Service: Gastroenterology;  Laterality: N/A;   CORONARY ANGIOGRAPHY N/A 06/16/2023   Procedure: CORONARY ANGIOGRAPHY;  Surgeon: Elmira Newman PARAS, MD;  Location: [facility];  Service: Cardiovascular;  Laterality: N/A;   CORONARY ULTRASOUND/IVUS N/A 12/29/2023   Procedure: Coronary Ultrasound/IVUS;  Surgeon: Serene Gaile ORN, MD;  Location: [facility];  Service: Cardiovascular;  Laterality: N/A;   DILATION AND CURETTAGE OF UTERUS  09/1976   ESOPHAGOGASTRODUODENOSCOPY (EGD) WITH PROPOFOL  N/A 04/25/2022   Procedure: ESOPHAGOGASTRODUODENOSCOPY (EGD) WITH PROPOFOL ;  Surgeon: Burnette Fallow, MD;  Location: [facility] ENDOSCOPY;  Service: Gastroenterology;  Laterality: N/A;   HEMOSTASIS CONTROL  04/26/2022   Procedure: HEMOSTASIS CONTROL;   Surgeon: Elicia Claw, MD;  Location: [facility] ENDOSCOPY;  Service: Gastroenterology;;   HOT HEMOSTASIS N/A 04/26/2022   Procedure: HOT HEMOSTASIS (ARGON PLASMA COAGULATION/BICAP);  Surgeon: Elicia Claw, MD;  Location: [facility] ENDOSCOPY;  Service: Gastroenterology;  Laterality: N/A;   LEFT HEART CATHETERIZATION WITH CORONARY ANGIOGRAM N/A 04/18/2014   Procedure: LEFT HEART CATHETERIZATION WITH CORONARY ANGIOGRAM;  Surgeon: Peter M Jordan, MD;  Location: [facility] CATH LAB;  Service: Cardiovascular;  Laterality: N/A;   LOWER EXTREMITY ANGIOGRAPHY N/A 12/29/2023   Procedure: Lower Extremity Angiography;  Surgeon: Serene Gaile ORN, MD;  Location: [facility];  Service: Cardiovascular;  Laterality: N/A;   MASS EXCISION  10/20/2011   Procedure: MINOR EXCISION OF MASS;  Surgeon: Elon CHRISTELLA Pacini, MD;  Location: [facility];  Service: General;  Laterality: N/A;  Excision mass posterior neck   OOPHORECTOMY  02/14/1978   POLYPECTOMY  04/26/2022   Procedure: POLYPECTOMY;  Surgeon: Elicia Claw, MD;  Location: [facility] ENDOSCOPY;  Service: Gastroenterology;;   SHOULDER SURGERY  10/18/2007   basal cell   SQUAMOUS CELL CARCINOMA EXCISION     TONSILLECTOMY  1955   TUBAL LIGATION  07/08/74   VISCERAL ANGIOGRAPHY N/A 02/16/2024   Procedure: VISCERAL ANGIOGRAPHY;  Surgeon: Serene Gaile ORN, MD;  Location: [facility];  Service: Cardiovascular;  Laterality: N/A;   VISCERAL ARTERY INTERVENTION N/A 02/16/2024   Procedure: VISCERAL ARTERY INTERVENTION;  Surgeon: Serene Gaile ORN, MD;  Location: [facility];  Service: Cardiovascular;  Laterality: N/A;   Prior to Admission medications  Medication Sig Start Date End Date Taking? Authorizing Provider  acetaminophen  (TYLENOL ) 500 MG tablet Take 500 mg by mouth daily as needed for fever, headache or mild pain (pain score 1-3).   Yes [provider]  albuterol  (VENTOLIN  HFA) 108 (90 Base) MCG/ACT inhaler Inhale 1-2 puffs into the lungs every  6 (six) hours as needed for wheezing or shortness of breath. 12/17/23  Yes Kara Dorn NOVAK, MD  ALPRAZolam  (XANAX ) 0.25 MG tablet Take 0.25 mg by mouth daily as needed for anxiety.   Yes [provider]  aspirin  EC 81 MG tablet Take 1 tablet (81 mg total) by mouth daily. Swallow whole. 02/16/24 02/15/25 Yes Serene Gaile ORN, MD  budesonide -formoterol  (SYMBICORT ) 160-4.5 MCG/ACT inhaler Inhale 2 puffs into the lungs 2 (two) times daily. Patient taking differently: Inhale 2 puffs into the lungs daily. Uses symbicort  in the morning and albuterol  at night. 12/17/23  Yes Kara Dorn NOVAK, MD  cholecalciferol  (VITAMIN D3) 25 MCG (1000 UNIT) tablet Take 2,000 Units by mouth daily.   Yes [provider]  clopidogrel  (PLAVIX ) 75 MG tablet Take 1 tablet (75 mg total) by mouth daily. 02/16/24 02/15/25 Yes Serene Gaile ORN, MD  docusate sodium (COLACE) 100 MG capsule Take 100 mg by mouth in the morning.   Yes [provider]  ELIQUIS  2.5 MG TABS tablet Take 1 tablet (2.5 mg total) by mouth 2 (two) times daily. Resume on 4/8 evening 03/04/24  Yes Patwardhan, Manish J, MD  escitalopram  (LEXAPRO ) 5 MG tablet Take 5 mg by mouth every evening.    Yes [provider]  ezetimibe  (ZETIA ) 10 MG tablet Take 10 mg by mouth daily.   Yes [provider]  furosemide  (LASIX ) 20 MG tablet Take 20 mg by mouth daily as needed for fluid  or edema.   Yes [provider]  levothyroxine  (SYNTHROID ) 137 MCG tablet Take 137 mcg by mouth every morning. 11/30/23  Yes [provider]  metoprolol  tartrate (LOPRESSOR ) 25 MG tablet Take 0.5 tablets (12.5 mg total) by mouth 2 (two) times daily. 09/08/23  Yes Ladona Heinz, MD  multivitamin-iron -minerals-folic acid (CENTRUM) chewable tablet Chew 1 tablet by mouth daily.   Yes [provider]  niCARdipine  (CARDENE ) 20 MG capsule Take 20 mg by mouth at bedtime.   Yes [provider]  nitroGLYCERIN  (NITRODUR - DOSED IN MG/24  HR) 0.4 mg/hr patch Place 0.4 mg onto the skin daily.   Yes [provider]  nitroGLYCERIN  (NITROSTAT ) 0.4 MG SL tablet Place 1 tablet (0.4 mg total) under the tongue every 5 (five) minutes as needed for chest pain. 07/21/23  Yes Ladona Heinz, MD  olopatadine (PATADAY) 0.1 % ophthalmic solution Place 1 drop into both eyes daily. 24 hr   Yes [provider]  pantoprazole  (PROTONIX ) 40 MG tablet Take 1 tablet (40 mg total) by mouth daily at 12 noon. Patient taking differently: Take 40 mg by mouth 2 (two) times daily. 04/21/14  Yes Ricky Fines, MD  Probiotic Product (ALIGN) 4 MG CAPS Take 4 mg by mouth daily.   Yes [provider]  ranolazine  (RANEXA ) 500 MG 12 hr tablet TAKE ONE TABLET BY MOUTH TWICE DAILY 05/22/23  Yes Ladona Heinz, MD  rosuvastatin  (CRESTOR ) 40 MG tablet Take 40 mg by mouth every evening. 12/14/23  Yes [provider]  triamcinolone cream (KENALOG) 0.1 % Apply 1 Application topically daily. On both legs.With vitamin E oil   Yes [provider]  rosuvastatin  (CRESTOR ) 5 MG tablet Take 1 tablet (5 mg total) by mouth daily. Patient not taking: Reported on 03/09/2024 02/16/24 02/15/25  Serene Gaile ORN, MD  fluticasone (FLONASE) 50 MCG/ACT nasal spray Place 2 sprays into both nostrils daily. 04/04/19 09/12/19  [provider]   Current Facility-Administered Medications  Medication Dose Route Frequency Provider Last Rate Last Admin   acetaminophen  (TYLENOL ) tablet 650 mg  650 mg Oral Q6H PRN Seena Marsa NOVAK, MD       Or   acetaminophen  (TYLENOL ) suppository 650 mg  650 mg Rectal Q6H PRN Seena Marsa NOVAK, MD       albuterol  (PROVENTIL ) (2.5 MG/3ML) 0.083% nebulizer solution 3 mL  3 mL Inhalation Q6H PRN Seena Marsa NOVAK, MD       [START ON 03/10/2024] aspirin  EC tablet 81 mg  81 mg Oral Daily Melvin, Alexander B, MD       escitalopram  (LEXAPRO ) tablet 5 mg  5 mg Oral QPM Melvin, Alexander B, MD       [START ON 03/10/2024] ezetimibe   (ZETIA ) tablet 10 mg  10 mg Oral Daily Melvin, Alexander B, MD       [START ON 03/10/2024] fluticasone furoate-vilanterol (BREO ELLIPTA) 200-25 MCG/ACT 1 puff  1 puff Inhalation Daily Melvin, Alexander B, MD       insulin  aspart (novoLOG ) injection 0-9 Units  0-9 Units Subcutaneous TID WC Melvin, Alexander B, MD       [START ON 03/10/2024] levothyroxine  (SYNTHROID ) tablet 137 mcg  137 mcg Oral Q24H Melvin, Alexander B, MD       metoprolol  tartrate (LOPRESSOR ) tablet 12.5 mg  12.5 mg Oral BID Melvin, Alexander B, MD       pantoprazole  (PROTONIX ) injection 40 mg  40 mg Intravenous Q12H Melvin, Alexander B, MD       ranolazine  (  RANEXA ) 12 hr tablet 500 mg  500 mg Oral BID Melvin, Alexander B, MD       rosuvastatin  (CRESTOR ) tablet 40 mg  40 mg Oral QPM Seena Marsa NOVAK, MD       sodium chloride  flush (NS) 0.9 % injection 3 mL  3 mL Intravenous Q12H Melvin, Alexander B, MD       Current Outpatient Medications  Medication Sig Dispense Refill   acetaminophen  (TYLENOL ) 500 MG tablet Take 500 mg by mouth daily as needed for fever, headache or mild pain (pain score 1-3).     albuterol  (VENTOLIN  HFA) 108 (90 Base) MCG/ACT inhaler Inhale 1-2 puffs into the lungs every 6 (six) hours as needed for wheezing or shortness of breath. 8 g 11   ALPRAZolam  (XANAX ) 0.25 MG tablet Take 0.25 mg by mouth daily as needed for anxiety.     aspirin  EC 81 MG tablet Take 1 tablet (81 mg total) by mouth daily. Swallow whole. 150 tablet 2   budesonide -formoterol  (SYMBICORT ) 160-4.5 MCG/ACT inhaler Inhale 2 puffs into the lungs 2 (two) times daily. (Patient taking differently: Inhale 2 puffs into the lungs daily. Uses symbicort  in the morning and albuterol  at night.) 1 each 12   cholecalciferol  (VITAMIN D3) 25 MCG (1000 UNIT) tablet Take 2,000 Units by mouth daily.     clopidogrel  (PLAVIX ) 75 MG tablet Take 1 tablet (75 mg total) by mouth daily. 30 tablet 11   docusate sodium (COLACE) 100 MG capsule Take 100 mg by mouth in the  morning.     ELIQUIS  2.5 MG TABS tablet Take 1 tablet (2.5 mg total) by mouth 2 (two) times daily. Resume on 4/8 evening 180 tablet 3   escitalopram  (LEXAPRO ) 5 MG tablet Take 5 mg by mouth every evening.      ezetimibe  (ZETIA ) 10 MG tablet Take 10 mg by mouth daily.     furosemide  (LASIX ) 20 MG tablet Take 20 mg by mouth daily as needed for fluid or edema.     levothyroxine  (SYNTHROID ) 137 MCG tablet Take 137 mcg by mouth every morning.     metoprolol  tartrate (LOPRESSOR ) 25 MG tablet Take 0.5 tablets (12.5 mg total) by mouth 2 (two) times daily. 90 tablet 3   multivitamin-iron -minerals-folic acid (CENTRUM) chewable tablet Chew 1 tablet by mouth daily.     niCARdipine  (CARDENE ) 20 MG capsule Take 20 mg by mouth at bedtime.     nitroGLYCERIN  (NITRODUR - DOSED IN MG/24 HR) 0.4 mg/hr patch Place 0.4 mg onto the skin daily.     nitroGLYCERIN  (NITROSTAT ) 0.4 MG SL tablet Place 1 tablet (0.4 mg total) under the tongue every 5 (five) minutes as needed for chest pain. 25 tablet 11   olopatadine (PATADAY) 0.1 % ophthalmic solution Place 1 drop into both eyes daily. 24 hr     pantoprazole  (PROTONIX ) 40 MG tablet Take 1 tablet (40 mg total) by mouth daily at 12 noon. (Patient taking differently: Take 40 mg by mouth 2 (two) times daily.) 30 tablet 1   Probiotic Product (ALIGN) 4 MG CAPS Take 4 mg by mouth daily.     ranolazine  (RANEXA ) 500 MG 12 hr tablet TAKE ONE TABLET BY MOUTH TWICE DAILY 180 tablet 3   rosuvastatin  (CRESTOR ) 40 MG tablet Take 40 mg by mouth every evening.     triamcinolone cream (KENALOG) 0.1 % Apply 1 Application topically daily. On both legs.With vitamin E oil     rosuvastatin  (CRESTOR ) 5 MG tablet Take 1 tablet (5 mg  total) by mouth daily. (Patient not taking: Reported on 03/09/2024) 30 tablet 11   Allergies as of 03/09/2024 - Review Complete 03/09/2024  Allergen Reaction Noted   Other Anaphylaxis and Cough 05/09/2014   Dilaudid [hydromorphone hcl] Nausea Only 05/09/2014    Erythromycin Nausea And Vomiting 04/23/2020   Diflucan [fluconazole] Palpitations and Hypertension 04/04/2023   Family History  Problem Relation Age of Onset   Heart disease Mother    Heart disease Father    Cancer Sister        breast   Cancer Daughter        cervical and bone   Heart disease Sister    Thyroid  disease Sister    Social History   Socioeconomic History   Marital status: Widowed    Spouse name: Not on file   Number of children: 3   Years of education: Not on file   Highest education level: Not on file  Occupational History   Not on file  Tobacco Use   Smoking status: Former    Current packs/day: 0.00    Average packs/day: 0.5 packs/day for 30.0 years (15.0 ttl pk-yrs)    Types: Cigarettes    Start date: 03/11/1975    Quit date: 03/10/2005    Years since quitting: 19.0   Smokeless tobacco: Never  Vaping Use   Vaping status: Never Used  Substance and Sexual Activity   Alcohol  use: No   Drug use: No   Sexual activity: Not on file  Other Topics Concern   Not on file  Social History Narrative   Not on file   Social Drivers of Health   Tobacco Use: Medium Risk (03/09/2024)   Patient History    Smoking Tobacco Use: Former    Smokeless Tobacco Use: Never    Passive Exposure: Not on Actuary Strain: Not on file  Food Insecurity: No Food Insecurity (04/05/2023)   Hunger Vital Sign    Worried About Running Out of Food in the Last Year: Never true    Ran Out of Food in the Last Year: Never true  Transportation Needs: No Transportation Needs (04/05/2023)   PRAPARE - Administrator, Civil Service (Medical): No    Lack of Transportation (Non-Medical): No  Physical Activity: Not on file  Stress: Not on file  Social Connections: Socially Isolated (04/05/2023)   Social Connection and Isolation Panel    Frequency of Communication with Friends and Family: More than three times a week    Frequency of Social Gatherings with Friends and  Family: More than three times a week    Attends Religious Services: Never    Database Administrator or Organizations: No    Attends Banker Meetings: Never    Marital Status: Widowed  Intimate Partner Violence: Not At Risk (04/05/2023)   Humiliation, Afraid, Rape, and Kick questionnaire    Fear of Current or Ex-Partner: No    Emotionally Abused: No    Physically Abused: No    Sexually Abused: No  Depression (PHQ2-9): Not on file  Alcohol  Screen: Not on file  Housing: Low Risk (04/05/2023)   Housing Stability Vital Sign    Unable to Pay for Housing in the Last Year: No    Number of Times Moved in the Last Year: 0    Homeless in the Last Year: No  Utilities: Not At Risk (04/05/2023)   [employer] Utilities    Threatened with loss of utilities: No  Health Literacy:  Not on file   Review of Systems:  Review of Systems  Respiratory:  Positive for shortness of breath.   Cardiovascular:  Negative for chest pain.  Gastrointestinal:  Positive for abdominal pain, blood in stool, constipation, diarrhea, melena and nausea. Negative for vomiting.    OBJECTIVE:   Temp:  [97.8 F (36.6 C)-98.1 F (36.7 C)] 98 F (36.7 C) (12/31 1511) Pulse Rate:  [50-66] 51 (12/31 1511) Resp:  [14-19] 17 (12/31 1511) BP: (112-137)/(45-78) 127/56 (12/31 1511) SpO2:  [96 %-100 %] 100 % (12/31 1511) Weight:  [85.3 kg] 85.3 kg (12/31 1006)   Physical Exam Constitutional:      General: She is not in acute distress.    Appearance: She is not ill-appearing, toxic-appearing or diaphoretic.  Cardiovascular:     Rate and Rhythm: Normal rate and regular rhythm.  Pulmonary:     Effort: No respiratory distress.     Breath sounds: Normal breath sounds.  Abdominal:     General: Bowel sounds are normal. There is no distension.     Palpations: Abdomen is soft.     Tenderness: There is no abdominal tenderness. There is no guarding.  Skin:    General: Skin is warm and dry.     Coloration: Skin is pale.   Neurological:     Mental Status: She is alert.     Labs: Recent Labs    03/09/24 1032  WBC 6.6  HGB 7.4*  HCT 23.4*  PLT 264   BMET Recent Labs    03/09/24 1032  NA 140  K 3.2*  CL 103  CO2 25  GLUCOSE 144*  BUN 15  CREATININE 0.73  CALCIUM  8.3*   LFT Recent Labs    03/09/24 1032  PROT 6.1*  ALBUMIN 3.8  AST 18  ALT 11  ALKPHOS 82  BILITOT 0.5   PT/INR Recent Labs    03/09/24 1032  LABPROT 15.7*  INR 1.2   Diagnostic imaging: No results found.  IMPRESSION: Melena Hematochezia Acute blood loss anemia SMA stenosis status post stenting 02/08/2024  - On triple therapy with Eliquis , aspirin  and Plavix  COPD on 3 L supplemental oxygen  at baseline History of coronary artery disease  PLAN: -Symptomatic anemia, query etiology, recommend video capsule endoscopy to further investigate, evaluate for any small bowel AVM -Patient in agreement for video capsule endoscopy -Okay for diet today, clear liquid diet after midnight for video capsule endoscopy 03/10/2024 -Trend H/H, transfuse for hemoglobin less than 7 -Monitor bowel movements, can continue anticoagulant therapy from GI perspective in setting of recent SMA stenting -Dr. Rosalie to follow-up tomorrow   LOS: 0 days   Estefana Keas, DO [facility] Gastroenterology

## 2024-03-09 NOTE — ED Provider Notes (Addendum)
 " [place] EMERGENCY DEPARTMENT AT [facility] Provider Note   CSN: [id number] Arrival date & time: 03/09/24  9062     Patient presents with: Rectal Bleeding   Desiree Leon is a 83 y.o. female.   Pt with c/o generalized weakness, feeling faint when stands, in past couple weeks. Notes recurrent black stools and at other times brbpr in past two weeks. Prior colonoscopy with hemorrhoids, diverticula of colon, polyps, and angioectatic lesions in cecum.  Pt also with recent vascular surgery stenting of SMA.  Pt denies new or worsening abd pain. +fatigue/DOE. Uses home o2 3 liters continuously on chronic basis. In on eliquis  and plavix .   The history is provided by the patient, a relative and medical records.  Rectal Bleeding Associated symptoms: light-headedness   Associated symptoms: no abdominal pain, no epistaxis, no fever and no vomiting        Prior to Admission medications  Medication Sig Start Date End Date Taking? Authorizing Provider  acetaminophen  (TYLENOL ) 500 MG tablet Take 500 mg by mouth daily as needed for fever or headache.    [provider]  albuterol  (VENTOLIN  HFA) 108 (90 Base) MCG/ACT inhaler Inhale 1-2 puffs into the lungs every 6 (six) hours as needed for wheezing or shortness of breath. 12/17/23   Kara Dorn NOVAK, MD  ALPRAZolam  (XANAX ) 0.25 MG tablet Take 0.25 mg by mouth daily as needed for anxiety.    [provider]  aspirin  EC 81 MG tablet Take 1 tablet (81 mg total) by mouth daily. Swallow whole. 02/16/24 02/15/25  Serene Gaile ORN, MD  budesonide -formoterol  (SYMBICORT ) 160-4.5 MCG/ACT inhaler Inhale 2 puffs into the lungs 2 (two) times daily. 12/17/23   Kara Dorn NOVAK, MD  cholecalciferol  (VITAMIN D3) 25 MCG (1000 UNIT) tablet Take 2,000 Units by mouth daily.    [provider]  clopidogrel  (PLAVIX ) 75 MG tablet Take 1 tablet (75 mg total) by mouth daily. 02/16/24 02/15/25  Serene Gaile ORN, MD  ELIQUIS  2.5 MG TABS  tablet Take 1 tablet (2.5 mg total) by mouth 2 (two) times daily. Resume on 4/8 evening 03/04/24   Patwardhan, Newman PARAS, MD  escitalopram  (LEXAPRO ) 5 MG tablet Take 5 mg by mouth every evening.     [provider]  ezetimibe  (ZETIA ) 10 MG tablet Take 10 mg by mouth daily.    [provider]  furosemide  (LASIX ) 20 MG tablet Take 20 mg by mouth daily as needed for fluid or edema.    [provider]  glucose blood test strip Accu-Chek SmartView Test Strips  CHECK BLOOD SUGAR TWICE DAILY.    [provider]  levothyroxine  (SYNTHROID ) 137 MCG tablet Take 137 mcg by mouth every morning. 11/30/23   [provider]  metoprolol  tartrate (LOPRESSOR ) 25 MG tablet Take 0.5 tablets (12.5 mg total) by mouth 2 (two) times daily. 09/08/23   Ladona Heinz, MD  multivitamin-iron -minerals-folic acid (CENTRUM) chewable tablet Chew 1 tablet by mouth daily.    [provider]  niCARdipine  (CARDENE ) 20 MG capsule Take 20 mg by mouth at bedtime.    [provider]  nitroGLYCERIN  (NITRODUR - DOSED IN MG/24 HR) 0.4 mg/hr patch Place 0.4 mg onto the skin daily.    [provider]  nitroGLYCERIN  (NITROSTAT ) 0.4 MG SL tablet Place 1 tablet (0.4 mg total) under the tongue every 5 (five) minutes as needed for chest pain. 07/21/23   Ladona Heinz, MD  olopatadine (PATADAY) 0.1 % ophthalmic solution Place 1 drop into  both eyes daily. 24 hr    [provider]  OXYGEN  Inhale 3 L into the lungs continuous.    [provider]  pantoprazole  (PROTONIX ) 40 MG tablet Take 1 tablet (40 mg total) by mouth daily at 12 noon. 04/21/14   Ricky Fines, MD  Probiotic Product (ALIGN) 4 MG CAPS Take 4 mg by mouth daily.    [provider]  ranolazine  (RANEXA ) 500 MG 12 hr tablet TAKE ONE TABLET BY MOUTH TWICE DAILY 05/22/23   Ladona Heinz, MD  rosuvastatin  (CRESTOR ) 40 MG tablet Take 40 mg by mouth every evening. 12/14/23   [provider]  rosuvastatin   (CRESTOR ) 5 MG tablet Take 1 tablet (5 mg total) by mouth daily. 02/16/24 02/15/25  Serene Gaile ORN, MD  triamcinolone cream (KENALOG) 0.1 % Apply 1 Application topically daily. On both legs.With vitamin E oil    [provider]  fluticasone (FLONASE) 50 MCG/ACT nasal spray Place 2 sprays into both nostrils daily. 04/04/19 09/12/19  [provider]    Allergies: Other, Dilaudid [hydromorphone hcl], Erythromycin, and Diflucan [fluconazole]    Review of Systems  Constitutional:  Negative for fever.  HENT:  Negative for nosebleeds.   Respiratory:  Negative for cough.   Cardiovascular:  Negative for chest pain.  Gastrointestinal:  Positive for blood in stool and hematochezia. Negative for abdominal pain, diarrhea and vomiting.  Genitourinary:  Negative for dysuria, flank pain and hematuria.  Musculoskeletal:  Negative for back pain and neck pain.  Neurological:  Positive for light-headedness. Negative for headaches.    Updated Vital Signs BP (!) 124/56   Pulse 66   Temp 98.1 F (36.7 C) (Oral)   Resp 18   Ht 1.6 m (5' 3)   Wt 85.3 kg   SpO2 100%   BMI 33.31 kg/m   Physical Exam Vitals and nursing note reviewed.  Constitutional:      Appearance: Normal appearance. She is well-developed.  HENT:     Head: Atraumatic.     Nose: Nose normal.     Mouth/Throat:     Mouth: Mucous membranes are moist.  Eyes:     General: No scleral icterus.    Pupils: Pupils are equal, round, and reactive to light.     Comments: Conj pale.   Neck:     Trachea: No tracheal deviation.  Cardiovascular:     Rate and Rhythm: Normal rate and regular rhythm.     Pulses: Normal pulses.     Heart sounds: Normal heart sounds. No murmur heard.    No friction rub. No gallop.  Pulmonary:     Effort: Pulmonary effort is normal. No respiratory distress.     Breath sounds: Normal breath sounds.  Abdominal:     General: Bowel sounds are normal. There is no distension.     Palpations: Abdomen  is soft. There is no mass.     Tenderness: There is no abdominal tenderness. There is no guarding.  Genitourinary:    Comments: Chaperoned rectal exam w female EMTs - scant amount medium brown stool, no mass or gross blood or melena noted, sent sample for hemoccult.  Musculoskeletal:        General: No swelling.     Cervical back: Normal range of motion and neck supple. No rigidity. No muscular tenderness.  Skin:    General: Skin is warm and dry.     Findings: No rash.  Neurological:     Mental Status: She is alert.  Comments: Alert, speech normal.   Psychiatric:        Mood and Affect: Mood normal.     (all labs ordered are listed, but only abnormal results are displayed) Results for orders placed or performed during the hospital encounter of 03/09/24  Type and screen   Collection Time: 03/09/24 10:30 AM  Result Value Ref Range   ABO/RH(D) PENDING    Antibody Screen PENDING    Sample Expiration      03/12/2024,2359 Performed at [facility], [address] [place], [place] [address]   CBC   Collection Time: 03/09/24 10:32 AM  Result Value Ref Range   WBC 6.6 4.0 - 10.5 K/uL   RBC 2.48 (L) 3.87 - 5.11 MIL/uL   Hemoglobin 7.4 (L) 12.0 - 15.0 g/dL   HCT 76.5 (L) 63.9 - 53.9 %   MCV 94.4 80.0 - 100.0 fL   MCH 29.8 26.0 - 34.0 pg   MCHC 31.6 30.0 - 36.0 g/dL   RDW 85.0 88.4 - 84.4 %   Platelets 264 150 - 400 K/uL   nRBC 0.0 0.0 - 0.2 %  Protime-INR   Collection Time: 03/09/24 10:32 AM  Result Value Ref Range   Prothrombin Time 15.7 (H) 11.4 - 15.2 seconds   INR 1.2 0.8 - 1.2   PERIPHERAL VASCULAR CATHETERIZATION Result Date: 02/16/2024 Images from the original result were not included. Patient name: NAHDIA DOUCET MRN: [id number] DOB: 1940-10-07 Sex: female 02/16/2024 Pre-operative Diagnosis: Mesenteric stenosis Post-operative diagnosis:  Same Surgeon:  [name] Procedure Performed:  1.  Ultrasound-guided access, left brachial artery  2.  Abdominal aortogram   3.  Stent, superior mesenteric artery  4.  Conscious sedation, 61 minutes Indications: This is an 83 year old female with chronic mesenteric ischemia.  I attempted to cross her SMA several weeks ago but ended up in a dissection plane and so I had to abort.  I brought her back to the office with a CT scan that shows a persistent dissection that begins at the level of the first branch however she still has persistent ostial stenosis which we talked about treating today. Procedure:  The patient was identified in the holding area and taken to room 8.  The patient was then placed supine on the table and prepped and draped in the usual sterile fashion.  A time out was called.  Conscious sedation was administered with the use of IV fentanyl  and Versed  under continuous physician and nurse monitoring.  Heart rate, blood pressure, and oxygen  saturation were continuously monitored.  Total sedation time was 61 minutes.  Ultrasound was used to evaluate the left brachial artery which was widely patent and easily compressible.  1% lidocaine  was used for local anesthesia.  Left brachial artery was cannulated under ultrasound guidance with a micropuncture needle.  A 018 wire was inserted followed by placement of micropuncture sheath.  Next a Bentson wire was placed and a 6 French sheath was inserted.  Using a Omni Flush catheter, a Glidewire advantage was directed into the descending thoracic aorta.  Next a pigtail catheter was inserted to the level of T12 and an abdominal aortogram was performed which identified the visceral vessels in the SMA stenosis which was approximately 70%.  Next, I upsized to a 6 x 90 sheath.  The patient was fully heparinized.  Using a Berenstein 2 catheter and a V-18 wire, the superior mesenteric artery was selected.  Lesion was primarily stented with a Gore VBX 6 x 19.  Completion imaging showed resolution of the stenosis.  I exchanged out for a short 6 French sheath and the patient will be taken the  holding her for sheath pull once her coagulation profile corrects. Impression:  #1  Successful stenting of ostial supra mesenteric artery stenosis using a 6 x 19 VBX from the left brachial approach  #2: The patient will require triple therapy for 1 month and then go back to Eliquis  alone  V. [name], M.D., [facility] Office: [phone] Pager:  [phone]     EKG: None  Radiology: No results found.   Procedures   Medications Ordered in the ED  pantoprazole  (PROTONIX ) injection 80 mg (has no administration in time range)  0.9 %  sodium chloride  infusion (Manually program via Guardrails IV Fluids) (has no administration in time range)                                    Medical Decision Making Problems Addressed: Current use of anticoagulant therapy: acute illness or injury    Details: Acute and chronic Diverticula, colon: chronic illness or injury History of hemorrhoids: chronic illness or injury Rectal bleeding: acute illness or injury with systemic symptoms that poses a threat to life or bodily functions Severe anemia: acute illness or injury with systemic symptoms that poses a threat to life or bodily functions Symptomatic anemia: acute illness or injury with systemic symptoms that poses a threat to life or bodily functions  Amount and/or Complexity of Data Reviewed Independent Historian:     Details: Family, hx External Data Reviewed: labs and notes. Labs: ordered. Decision-making details documented in ED Course. Discussion of management or test interpretation with external provider(s): Medicine, gi.   Risk Prescription drug management. Decision regarding hospitalization.   Iv ns. Continuous pulse ox and cardiac monitoring. Labs ordered/sent.   Differential diagnosis includes gi bleeding, symptomatic anemia, mesenteric ischemia, etc. Dispo decision including potential need for admission considered - will get labs  and  reassess.   Reviewed nursing notes and prior charts for additional history. External reports reviewed. Additional history from: family.   Cardiac monitor: sinus rhythm, rate 68.  Labs reviewed/interpreted by me - hgb low, 7.4, was 10 last month. Prbc transfusion. Medicine consulted for admission. Gi consulted.  Discussed w [facility] on call - they will see in consult.   CRITICAL CARE RE: severe symptomatic anemia, hgb 7, requiring emergent prbc transfusion.  Performed by: Reyanna Baley E Izzak Fries Total critical care time: 45 minutes Critical care time was exclusive of separately billable procedures and treating other patients. Critical care was necessary to treat or prevent imminent or life-threatening deterioration. Critical care was time spent personally by me on the following activities: development of treatment plan with patient and/or surrogate as well as nursing, discussions with consultants, evaluation of patient's response to treatment, examination of patient, obtaining history from patient or surrogate, ordering and performing treatments and interventions, ordering and review of laboratory studies, ordering and review of radiographic studies, pulse oximetry and re-evaluation of patient's condition.       Final diagnoses:  None    ED Discharge Orders     None          Bernard Drivers, MD 03/09/24 1210  "

## 2024-03-09 NOTE — H&P (Addendum)
 " History and Physical   [name] FMW:[id number] DOB: [birth year] DOA: 03/09/2024  PCP: Shepard Ade, MD   Patient coming from: Home  Chief Complaint: Rectal bleeding  HPI: Desiree Leon is a 83 y.o. female with medical history significant of hypertension, hyperlipidemia, CVA, diabetes, hypothyroidism, CKD 3A, carotid artery disease, PAD, mesenteric artery stenosis status post stent, CAD with CTO, CHF, GI bleed, depression, anxiety, anemia, COPD, chronic respiratory failure with hypoxia, PE presenting with rectal bleeding.  Patient reports 2 weeks of intermittent black stools and bright red blood per rectum.  Patient reports increasing weakness and some lightheaded episodes at this time.  Also reporting fatigue and increased shortness of breath.  Is chronically on supplemental oxygen .  Known history of prior GI bleeding.  Noted to have diverticulosis, polyps, angioectasia at that time.  Patient is on triple therapy with Eliquis  given history of PE, Plavix , aspirin  in the setting of recent mesenteric stent placement by vascular surgery.  Patient denies fevers, chills no chest pain, abdominal pain, constipation vital signs in the ED notable for blood pressure in the 120s systolic., nausea, vomiting.  ED Course:  Lab workup included CMP with potassium 3.2, glucose 144, calcium  8.3, protein 6.1.  CBC with hemoglobin down to 7.4 from 10.23 weeks ago.  PT 15.7, INR normal.  FOBT positive.  Type and screen performed.  ED provider ordered IV PPI and 1 unit PRBC.  GI consulted and will see the patient.  Review of Systems: As per HPI otherwise all other systems reviewed and are negative.  Past Medical History:  Diagnosis Date   Acute diastolic heart failure (HCC) 03/09/2024   Acute pulmonary embolism (HCC) 05/21/2021   Anxiety    Blood transfusion    CAP (community acquired pneumonia)    COPD (chronic obstructive pulmonary disease) (HCC)    Heart murmur    History of colon polyps     Hyperlipidemia    Hypertension    MI (myocardial infarction) (HCC)    Mitral valve prolapse    NSTEMI (non-ST elevated myocardial infarction) (HCC)    Osteoarthritis    Osteoporosis    Pulmonary emboli (HCC) 04/12/2019   Spinal stenosis    Thyroid  disease    hypothyroidism   Unstable angina (HCC) 05/08/2014   Venous insufficiency     Past Surgical History:  Procedure Laterality Date   ABDOMINAL AORTOGRAM N/A 02/16/2024   Procedure: ABDOMINAL AORTOGRAM;  Surgeon: Serene Gaile ORN, MD;  Location: [facility];  Service: Cardiovascular;  Laterality: N/A;   ABDOMINAL AORTOGRAM W/LOWER EXTREMITY N/A 12/29/2023   Procedure: ABDOMINAL AORTOGRAM W/LOWER EXTREMITY;  Surgeon: Serene Gaile ORN, MD;  Location: [facility];  Service: Cardiovascular;  Laterality: N/A;   ABDOMINAL HYSTERECTOMY  11/26/1976   APPENDECTOMY  02/14/1978   BASAL CELL CARCINOMA EXCISION  01/26/1990   COLONOSCOPY  1993, 2003, 2008, 2012   COLONOSCOPY WITH PROPOFOL  N/A 04/26/2022   Procedure: COLONOSCOPY WITH PROPOFOL ;  Surgeon: Elicia Claw, MD;  Location: [facility];  Service: Gastroenterology;  Laterality: N/A;   CORONARY ANGIOGRAPHY N/A 06/16/2023   Procedure: CORONARY ANGIOGRAPHY;  Surgeon: Elmira Newman PARAS, MD;  Location: [facility];  Service: Cardiovascular;  Laterality: N/A;   CORONARY ULTRASOUND/IVUS N/A 12/29/2023   Procedure: Coronary Ultrasound/IVUS;  Surgeon: Serene Gaile ORN, MD;  Location: [facility];  Service: Cardiovascular;  Laterality: N/A;   DILATION AND CURETTAGE OF UTERUS  09/1976   ESOPHAGOGASTRODUODENOSCOPY (EGD) WITH PROPOFOL  N/A 04/25/2022   Procedure: ESOPHAGOGASTRODUODENOSCOPY (  EGD) WITH PROPOFOL ;  Surgeon: Burnette Fallow, MD;  Location: [facility] ENDOSCOPY;  Service: Gastroenterology;  Laterality: N/A;   HEMOSTASIS CONTROL  04/26/2022   Procedure: HEMOSTASIS CONTROL;  Surgeon: Elicia Claw, MD;  Location: [facility];  Service: Gastroenterology;;   HOT  HEMOSTASIS N/A 04/26/2022   Procedure: HOT HEMOSTASIS (ARGON PLASMA COAGULATION/BICAP);  Surgeon: Elicia Claw, MD;  Location: [facility] ENDOSCOPY;  Service: Gastroenterology;  Laterality: N/A;   LEFT HEART CATHETERIZATION WITH CORONARY ANGIOGRAM N/A 04/18/2014   Procedure: LEFT HEART CATHETERIZATION WITH CORONARY ANGIOGRAM;  Surgeon: Peter M Jordan, MD;  Location: [facility] CATH LAB;  Service: Cardiovascular;  Laterality: N/A;   LOWER EXTREMITY ANGIOGRAPHY N/A 12/29/2023   Procedure: Lower Extremity Angiography;  Surgeon: Serene Gaile ORN, MD;  [facility]: [facility];  Service: Cardiovascular;  Laterality: N/A;   MASS EXCISION  10/20/2011   Procedure: MINOR EXCISION OF MASS;  Surgeon: Elon CHRISTELLA Pacini, MD;  Location: [facility];  Service: General;  Laterality: N/A;  Excision mass posterior neck   OOPHORECTOMY  02/14/1978   POLYPECTOMY  04/26/2022   Procedure: POLYPECTOMY;  Surgeon: Elicia Claw, MD;  Location: [facility];  Service: Gastroenterology;;   SHOULDER SURGERY  10/18/2007   basal cell   SQUAMOUS CELL CARCINOMA EXCISION     TONSILLECTOMY  1955   TUBAL LIGATION  07/08/74   VISCERAL ANGIOGRAPHY N/A 02/16/2024   Procedure: VISCERAL ANGIOGRAPHY;  Surgeon: Serene Gaile ORN, MD;  [facility]: [facility];  Service: Cardiovascular;  Laterality: N/A;   VISCERAL ARTERY INTERVENTION N/A 02/16/2024   Procedure: VISCERAL ARTERY INTERVENTION;  Surgeon: Serene Gaile ORN, MD;  Location: [facility];  Service: Cardiovascular;  Laterality: N/A;    Social History  reports that she quit smoking about 19 years ago. Her smoking use included cigarettes. She started smoking about 49 years ago. She has a 15 pack-year smoking history. She has never used smokeless tobacco. She reports that she does not drink alcohol  and does not use drugs.  Allergies[1]  Family History  Problem Relation Age of Onset   Heart disease Mother    Heart disease Father    Cancer Sister        breast    Cancer Daughter        cervical and bone   Heart disease Sister    Thyroid  disease Sister   Reviewed on admission  Prior to Admission medications  Medication Sig Start Date End Date Taking? Authorizing Provider  acetaminophen  (TYLENOL ) 500 MG tablet Take 500 mg by mouth daily as needed for fever, headache or mild pain (pain score 1-3).   Yes [provider]  albuterol  (VENTOLIN  HFA) 108 (90 Base) MCG/ACT inhaler Inhale 1-2 puffs into the lungs every 6 (six) hours as needed for wheezing or shortness of breath. 12/17/23  Yes Kara Dorn NOVAK, MD  ALPRAZolam  (XANAX ) 0.25 MG tablet Take 0.25 mg by mouth daily as needed for anxiety.   Yes [provider]  aspirin  EC 81 MG tablet Take 1 tablet (81 mg total) by mouth daily. Swallow whole. 02/16/24 02/15/25 Yes Serene Gaile ORN, MD  budesonide -formoterol  (SYMBICORT ) 160-4.5 MCG/ACT inhaler Inhale 2 puffs into the lungs 2 (two) times daily. Patient taking differently: Inhale 2 puffs into the lungs daily. Uses symbicort  in the morning and albuterol  at night. 12/17/23  Yes Kara Dorn NOVAK, MD  cholecalciferol  (VITAMIN D3) 25 MCG (1000 UNIT) tablet Take 2,000 Units by mouth daily.   Yes [provider]  clopidogrel  (PLAVIX ) 75 MG  tablet Take 1 tablet (75 mg total) by mouth daily. 02/16/24 02/15/25 Yes Serene Gaile ORN, MD  docusate sodium (COLACE) 100 MG capsule Take 100 mg by mouth in the morning.   Yes [provider]  ELIQUIS  2.5 MG TABS tablet Take 1 tablet (2.5 mg total) by mouth 2 (two) times daily. Resume on 4/8 evening 03/04/24  Yes Patwardhan, Manish J, MD  escitalopram  (LEXAPRO ) 5 MG tablet Take 5 mg by mouth every evening.    Yes [provider]  ezetimibe  (ZETIA ) 10 MG tablet Take 10 mg by mouth daily.   Yes [provider]  furosemide  (LASIX ) 20 MG tablet Take 20 mg by mouth daily as needed for fluid or edema.   Yes [provider]  levothyroxine  (SYNTHROID ) 137 MCG tablet Take 137  mcg by mouth every morning. 11/30/23  Yes [provider]  metoprolol  tartrate (LOPRESSOR ) 25 MG tablet Take 0.5 tablets (12.5 mg total) by mouth 2 (two) times daily. 09/08/23  Yes Ladona Heinz, MD  multivitamin-iron -minerals-folic acid (CENTRUM) chewable tablet Chew 1 tablet by mouth daily.   Yes [provider]  niCARdipine  (CARDENE ) 20 MG capsule Take 20 mg by mouth at bedtime.   Yes [provider]  nitroGLYCERIN  (NITRODUR - DOSED IN MG/24 HR) 0.4 mg/hr patch Place 0.4 mg onto the skin daily.   Yes [provider]  nitroGLYCERIN  (NITROSTAT ) 0.4 MG SL tablet Place 1 tablet (0.4 mg total) under the tongue every 5 (five) minutes as needed for chest pain. 07/21/23  Yes Ladona Heinz, MD  olopatadine (PATADAY) 0.1 % ophthalmic solution Place 1 drop into both eyes daily. 24 hr   Yes [provider]  pantoprazole  (PROTONIX ) 40 MG tablet Take 1 tablet (40 mg total) by mouth daily at 12 noon. Patient taking differently: Take 40 mg by mouth 2 (two) times daily. 04/21/14  Yes Ricky Fines, MD  Probiotic Product (ALIGN) 4 MG CAPS Take 4 mg by mouth daily.   Yes [provider]  ranolazine  (RANEXA ) 500 MG 12 hr tablet TAKE ONE TABLET BY MOUTH TWICE DAILY 05/22/23  Yes Ladona Heinz, MD  rosuvastatin  (CRESTOR ) 40 MG tablet Take 40 mg by mouth every evening. 12/14/23  Yes [provider]  triamcinolone cream (KENALOG) 0.1 % Apply 1 Application topically daily. On both legs.With vitamin E oil   Yes [provider]  rosuvastatin  (CRESTOR ) 5 MG tablet Take 1 tablet (5 mg total) by mouth daily. Patient not taking: Reported on 03/09/2024 02/16/24 02/15/25  Serene Gaile ORN, MD  fluticasone (FLONASE) 50 MCG/ACT nasal spray Place 2 sprays into both nostrils daily. 04/04/19 09/12/19  [provider]    Physical Exam: Vitals:   03/09/24 1256 03/09/24 1300 03/09/24 1330 03/09/24 1400  BP: (!) 127/50 (!) 116/45 130/60 136/63  Pulse: (!) 50 (!) 51 (!) 51  (!) 51  Resp: 14 16 15 17   Temp: 98.1 F (36.7 C)     TempSrc:      SpO2: 100% 100% 100% 100%  Weight:      Height:        Physical Exam Constitutional:      General: She is not in acute distress.    Appearance: Normal appearance.  HENT:     Head: Normocephalic and atraumatic.     Mouth/Throat:     Mouth: Mucous membranes are moist.     Pharynx: Oropharynx is clear.  Eyes:     Extraocular Movements: Extraocular movements intact.     Pupils:  Pupils are equal, round, and reactive to light.  Cardiovascular:     Rate and Rhythm: Normal rate and regular rhythm.     Pulses: Normal pulses.     Heart sounds: Normal heart sounds.  Pulmonary:     Effort: Pulmonary effort is normal. No respiratory distress.     Breath sounds: Normal breath sounds.  Abdominal:     General: Bowel sounds are normal. There is no distension.     Palpations: Abdomen is soft.     Tenderness: There is abdominal tenderness.  Musculoskeletal:        General: No swelling or deformity.  Skin:    General: Skin is warm and dry.  Neurological:     General: No focal deficit present.     Mental Status: Mental status is at baseline.    Labs on Admission: I have personally reviewed following labs and imaging studies  CBC: Recent Labs  Lab 03/09/24 1032  WBC 6.6  HGB 7.4*  HCT 23.4*  MCV 94.4  PLT 264    Basic Metabolic Panel: Recent Labs  Lab 03/09/24 1032  NA 140  K 3.2*  CL 103  CO2 25  GLUCOSE 144*  BUN 15  CREATININE 0.73  CALCIUM  8.3*    GFR: Estimated Creatinine Clearance: 54.2 mL/min (by C-G formula based on SCr of 0.73 mg/dL).  Liver Function Tests: Recent Labs  Lab 03/09/24 1032  AST 18  ALT 11  ALKPHOS 82  BILITOT 0.5  PROT 6.1*  ALBUMIN 3.8    Urine analysis:    Component Value Date/Time   COLORURINE AMBER (A) 04/04/2023 1625   APPEARANCEUR HAZY (A) 04/04/2023 1625   LABSPEC 1.021 04/04/2023 1625   PHURINE 5.0 04/04/2023 1625   GLUCOSEU NEGATIVE 04/04/2023 1625    HGBUR NEGATIVE 04/04/2023 1625   BILIRUBINUR NEGATIVE 04/04/2023 1625   KETONESUR NEGATIVE 04/04/2023 1625   PROTEINUR NEGATIVE 04/04/2023 1625   UROBILINOGEN 0.2 06/18/2013 1625   NITRITE NEGATIVE 04/04/2023 1625   LEUKOCYTESUR NEGATIVE 04/04/2023 1625    Radiological Exams on Admission: No results found.  EKG: Not performed in the emergency department.  Assessment/Plan Principal Problem:   Acute GI bleeding Active Problems:   History of pulmonary embolus (PE)   Hypothyroidism   Essential hypertension   Dyslipidemia   Gastro-esophageal reflux disease without esophagitis   COPD (chronic obstructive pulmonary disease) (HCC)   Anxiety disorder   Carotid artery occlusion   Chronic anemia   Chronic combined systolic and diastolic heart failure (HCC)   Hyperlipidemia   Oxygen  dependent   Type 2 diabetes mellitus with other circulatory complications (HCC)   Depression   History of CVA (cerebrovascular accident)   CAD (coronary artery disease)   Mesenteric artery stenosis   CKD stage 3a, GFR 45-59 ml/min (HCC)   Chronic respiratory failure (HCC)   GI bleed Anemia > Intermittent dark stools and bright red stools for the past couple weeks. > History of prior GI bleeding with procedure showing diverticulosis, polyps, and ectatic lesions. > Is currently on triple therapy with Eliquis , Plavix , and aspirin .  This is in the setting of prior pulmonary embolisms and recent mesenteric artery stent placed with plan for triple therapy through 1/9. > Hemoglobin dropped from 10.2 three weeks ago to 7.4 today. > Given patient's symptoms 1 unit has been ordered for transfusion in the ED. > GI has been consulted and will see the patient. - Monitor on telemetry - Appreciate GI recommendations and assistance - Hold Plavix , Eliquis  for  now - Continue with IV PPI - Trend hemoglobin - Supportive care  Hypertension - Continue home metoprolol  - Only takes Lasix  as needed - Will hold  nicardipine  for now with ongoing bleeding  Hyperlipidemia - Continue home Zetia  and rosuvastatin   Diabetes - SSI > Patient declining SSI, will continue with CBG checks and add back SSI if persistent elevation.  Hypothyroidism - Continue home Synthroid   Carotid artery disease - Continue home Zetia  and rosuvastatin  as above - Holding Eliquis , Plavix   PAD Mesenteric artery stenosis > As above, patient recently had stent placed in her mesenteric artery for relief of stenosis on 12/9 of this year.  Patient placed on triple therapy with Plavix  and aspirin  in addition to Eliquis ; plan was to continue this through 1/9 of 2026. > Anticoagulation and antiplatelets held in the setting of above - Will discuss with vascular surgery ADDENDUM > Briefly discussed situation with vascular surgery on-call, Dr. Silver, who states it is reasonable to continue with aspirin  for now and restart her on aspirin  and Eliquis  on discharge, do not need to resume Plavix . - Will restart aspirin   CAD > Cath in 2025 showed chronic totally occluded vessel with collaterals. - Continue Zetia , statin - Holding antiplatelets and anticoagulation as above - Continue metoprolol , Ranexa   ?History of CHF > This has been reported in the past, however most recent echo shows EF 60-65%, indeterminate diastolic function, normal RV function.  Currently taking Lasix  only as needed. - Continue home metoprolol   Depression Anxiety - Continue Lexapro   Asthma Chronic respiratory failure with hypoxia > Is chronically on 3 L of of supplemental oxygen .  In the setting of asthma, recurrent PE, heart disease.  Does follow with pulmonology. - Continue with home oxygen  - Replace home Symbicort  formulary Breo - As needed albuterol   History of PE > History of 2 unprovoked pulmonary embolisms. - Holding Eliquis  in the setting of bleeding as above  DVT prophylaxis: SCDs for now Code Status:   Full Family Communication:  Updated  at bedside  Disposition Plan:   Patient is from:  Home  Anticipated DC to:  Home  Anticipated DC date:  1 to 3 days  Anticipated DC barriers: None  Consults called:  Gastroenterology Admission status:  Observation, telemetry  Severity of Illness: The appropriate patient status for this patient is OBSERVATION. Observation status is judged to be reasonable and necessary in order to provide the required intensity of service to ensure the patient's safety. The patient's presenting symptoms, physical exam findings, and initial radiographic and laboratory data in the context of their medical condition is felt to place them at decreased risk for further clinical deterioration. Furthermore, it is anticipated that the patient will be medically stable for discharge from the hospital within 2 midnights of admission.    Marsa KATHEE Scurry MD [facility]  How to contact the [facility] Attending or Consulting provider 7A - 7P or covering provider during after hours 7P -7A, for this patient?   Check the care team in [facility] and look for a) attending/consulting [facility] provider listed and b) the [facility] team listed Log into [email or web address] and use [place]'s universal password to access. If you do not have the password, please contact the hospital operator. Locate the [facility] provider you are looking for under [facility] and page to a number that you can be directly reached. If you still have difficulty reaching the provider, please page the [facility] (Director on Call) for the Hospitalists listed on amion for assistance.  03/09/2024,  3:05 PM       [1]  Allergies Allergen Reactions   Other Anaphylaxis and Cough    fragarances-perfumes   Dilaudid [Hydromorphone Hcl] Nausea Only   Erythromycin Nausea And Vomiting   Diflucan [Fluconazole] Palpitations and Hypertension    When taken with Nicardipine , reaction happens.   "

## 2024-03-09 NOTE — ED Triage Notes (Signed)
 Pt is here from home with black stools since 12/18 and then this am with red bleeding now. She is on Eliquis , ASA, and Plavix . Reports generalized abdominal pain for months. Reports dizziness and increased sob. Pt is on home 02/3L

## 2024-03-09 NOTE — Telephone Encounter (Signed)
 Pt's daughter, Wanda, called reporting sever abdominal pain, black stools that started 12/16, fresh blood in stool this morning and increased overall weakness.  Advised to go to ED.

## 2024-03-10 ENCOUNTER — Encounter (HOSPITAL_COMMUNITY): Payer: Self-pay | Admitting: Internal Medicine

## 2024-03-10 ENCOUNTER — Encounter (HOSPITAL_COMMUNITY): Admission: EM | Disposition: A | Payer: Self-pay | Source: Home / Self Care | Attending: Family Medicine

## 2024-03-10 DIAGNOSIS — K922 Gastrointestinal hemorrhage, unspecified: Secondary | ICD-10-CM | POA: Diagnosis not present

## 2024-03-10 DIAGNOSIS — R0609 Other forms of dyspnea: Secondary | ICD-10-CM | POA: Diagnosis not present

## 2024-03-10 HISTORY — PX: GIVENS CAPSULE STUDY: SHX5432

## 2024-03-10 LAB — CBC
HCT: 23.8 % — ABNORMAL LOW (ref 36.0–46.0)
Hemoglobin: 7.8 g/dL — ABNORMAL LOW (ref 12.0–15.0)
MCH: 30 pg (ref 26.0–34.0)
MCHC: 32.8 g/dL (ref 30.0–36.0)
MCV: 91.5 fL (ref 80.0–100.0)
Platelets: 230 K/uL (ref 150–400)
RBC: 2.6 MIL/uL — ABNORMAL LOW (ref 3.87–5.11)
RDW: 15.4 % (ref 11.5–15.5)
WBC: 6.7 K/uL (ref 4.0–10.5)
nRBC: 0.3 % — ABNORMAL HIGH (ref 0.0–0.2)

## 2024-03-10 LAB — COMPREHENSIVE METABOLIC PANEL WITH GFR
ALT: 8 U/L (ref 0–44)
AST: 20 U/L (ref 15–41)
Albumin: 3.3 g/dL — ABNORMAL LOW (ref 3.5–5.0)
Alkaline Phosphatase: 73 U/L (ref 38–126)
Anion gap: 8 (ref 5–15)
BUN: 9 mg/dL (ref 8–23)
CO2: 26 mmol/L (ref 22–32)
Calcium: 8.1 mg/dL — ABNORMAL LOW (ref 8.9–10.3)
Chloride: 106 mmol/L (ref 98–111)
Creatinine, Ser: 0.71 mg/dL (ref 0.44–1.00)
GFR, Estimated: >60 mL/min
Glucose, Bld: 130 mg/dL — ABNORMAL HIGH (ref 70–99)
Potassium: 3.9 mmol/L (ref 3.5–5.1)
Sodium: 140 mmol/L (ref 135–145)
Total Bilirubin: 0.8 mg/dL (ref 0.0–1.2)
Total Protein: 5.3 g/dL — ABNORMAL LOW (ref 6.5–8.1)

## 2024-03-10 LAB — TYPE AND SCREEN
ABO/RH(D): O NEG
Antibody Screen: NEGATIVE
Unit division: 0

## 2024-03-10 LAB — GLUCOSE, CAPILLARY
Glucose-Capillary: 102 mg/dL — ABNORMAL HIGH (ref 70–99)
Glucose-Capillary: 106 mg/dL — ABNORMAL HIGH (ref 70–99)
Glucose-Capillary: 109 mg/dL — ABNORMAL HIGH (ref 70–99)
Glucose-Capillary: 118 mg/dL — ABNORMAL HIGH (ref 70–99)
Glucose-Capillary: 140 mg/dL — ABNORMAL HIGH (ref 70–99)

## 2024-03-10 LAB — BPAM RBC
Blood Product Expiration Date: 202601052359
ISSUE DATE / TIME: 202512311231
Unit Type and Rh: 9500

## 2024-03-10 LAB — HEMOGLOBIN AND HEMATOCRIT, BLOOD
HCT: 24 % — ABNORMAL LOW (ref 36.0–46.0)
Hemoglobin: 7.8 g/dL — ABNORMAL LOW (ref 12.0–15.0)

## 2024-03-10 MED ORDER — LACTATED RINGERS IV SOLN: INTRAVENOUS | Status: DC

## 2024-03-10 MED ORDER — NITROGLYCERIN 0.4 MG/HR TD PT24
0.4000 mg | MEDICATED_PATCH | TRANSDERMAL | Status: DC
  Administered 2024-03-10 – 2024-03-13 (×4): 0.4 mg via TRANSDERMAL
  Filled 2024-03-10 (×5): qty 1

## 2024-03-10 MED ORDER — LACTATED RINGERS IV BOLUS
1000.0000 mL | INTRAVENOUS | Status: AC
  Administered 2024-03-10: 1000 mL via INTRAVENOUS

## 2024-03-10 NOTE — Progress Notes (Signed)
 Desiree Leon Blush 1:12 PM  Subjective: Patient seen and examined and case discussed with multiple family members as well as my partner Dr. Kriss and she was on aspirin  Plavix  and Eliquis  after her stent and started bleeding fairly soon afterward and her stent really has not affected her chronic lower abdominal pain and she has not had a bowel movement since she has been here and she swallowed the capsule well and we discussed how that procedure goes and answered all of their questions  Objective: Vital signs stable afebrile no acute distress abdomen is soft she has some minimal lower discomfort no guarding or rebound BUN and creatinine normal hemoglobin low but stable decreased a few points from her procedure  Assessment: GI blood loss chronic lower abdominal pain and patient on multiple blood thinners  Plan: Dr. Kriss should read the capsule tomorrow and further workup and plans pending those findings and we discussed slowly advancing her diet based on capsule report  [facility]  office [phone] After 5PM or if no answer call [phone]

## 2024-03-10 NOTE — Hospital Course (Addendum)
 Desiree Leon is a 84 y.o. female with a history of hypertension, hyperlipidemia, stroke, diabetes mellitus type 2, hypothyroidism, CKD stage IIIa, CAD, carotid artery stenosis, PAD, mesenteric stenosis s/p stent, heart failure, GI bleeding, anxiety, depression, COPD, chronic respiratory failure on oxygen .  Patient presented secondary to GI bleeding with associated anemia. GI consulted and capsule endoscopy performed on 03/10/24 revealing no source of bleeding. Colonoscopy on 1/5 significant for AVMs s/p fulguration and application of hemostatic spray. Hemoglobin stable. Normal bowel movements prior to discharge.

## 2024-03-10 NOTE — Progress Notes (Signed)
 [facility] capsule endoscopy ordered by MD dr kriss.  Patient ingested capsule at 1030.  Per Given's capsule instructions, patient to remain NPO until 12:30 at which time they may progress to clear liquid diet. At 14:30 patient may have a small snack such as a half a sandwich or a bowl of soup. At 18:30 patient may progress to previously ordered diet.  The capsule endoscopy study will conclude at 22:30 at which time the recorder and leads or belt can be removed and placed in a patient belongings bag. Endoscopy staff will pick up the equipment in the AM.  Instructions provided to patient and inpatient RN. Patient and RN demonstrated understanding.

## 2024-03-10 NOTE — Plan of Care (Signed)

## 2024-03-10 NOTE — Progress Notes (Signed)
 "  PROGRESS NOTE    Desiree Leon  FMW:[id number] DOB: 09-02-1940 DOA: 03/09/2024 PCP: Shepard Ade, MD   Brief Narrative: Desiree Leon is a 84 y.o. female with a history of hypertension, hyperlipidemia, stroke, diabetes mellitus type 2, hypothyroidism, CKD stage IIIa, CAD, carotid artery stenosis, PAD, mesenteric stenosis s/p stent, heart failure, GI bleeding, anxiety, depression, COPD, chronic respiratory failure on oxygen .  Patient presented secondary to GI bleeding with associated anemia. GI consulted.   Assessment and Plan:  GI bleed Patient with a history of prior GI bleed with workup significant for diverticulosis, polyps and ectatic lesions, now presenting with dark and bright red stools. Complicated by aspirin , Plavix  and Eliquis  use. Gastroenterology consulted on admission. - GI recommendations: capsule endoscopy, goal hgb >7  Chronic normocytic anemia Acute blood loss anemia Baseline hemoglobin around 10 g/dL. Hemoglobin of 7.4 g/dL on admission with improvement to 7.9 g/dL after 1 unit of PRBC via transfusion.  Chronic respiratory failure with hypoxia Patient is managed on 3 L/min of supplemental oxygen  at baseline. Stable. - Continue oxygen   Primary hypertension - Continue metoprolol  tartrate  Hyperlipidemia - Continue Zetia   Diabetes mellitus type 2 Well controlled based on hemoglobin A1C of 6.4% from January 2025.  Hypothyroidism - Continue  Synthroid  137 mcg daily  CAD Currently no chest pain. Last heart catheterization from April 2025. Patient is managed on aspirin , Crestor , Zetia , Ranexa , metoprolol , but is also on Plavix  from per vascular surgery placement of a mesenteric artery stent. - Continue Ranexa   PAD Mesenteric artery stenosis Patient follows with vascular surgery as an outpatient with recent stent placement on 12/9. Patient was placed on Aspirin , Plavix  and maintained on Eliquis  with plan to continue triple therapy until 03/18/24. Plavix  and  Eliquis  held secondary to GI bleeding. Vascular surgery contacted this admission and recommended to continue Aspirin . - Continue Aspirin   Chronic HFpEF Stable.  Carotid artery disease - Continue Crestor  and Zetia   Depression Anxiety - Continue Lexapro   History of PE Patient is managed on Eliquis  as an outpatient which is held secondary to GI bleeding.   DVT prophylaxis: SCDs Code Status:   Code Status: Full Code Family Communication: Daughter at bedside Disposition Plan: Discharge pending ongoing GI recommendations/management, stable hemoglobin and resolution of GI bleeding   Consultants:  [facility]  Procedures:  None  Antimicrobials: None    Subjective: Patient reports no GI bleeding overnight or this morning. Hungry.  Objective: BP (!) 130/53 (BP Location: Left Arm)   Pulse (!) 59   Temp 97.9 F (36.6 C)   Resp 18   Ht 5' 3 (1.6 m)   Wt 85.3 kg   SpO2 98%   BMI 33.31 kg/m   Examination:  General exam: Appears calm and comfortable. Respiratory system: Clear to auscultation. Respiratory effort normal. Cardiovascular system: S1 & S2 heard, RRR. Gastrointestinal system: Abdomen is nondistended, soft and nontender. Normal bowel sounds heard. Central nervous system: Alert and oriented. No focal neurological deficits. Musculoskeletal: No calf tenderness Psychiatry: Judgement and insight appear normal. Mood & affect appropriate.    Data Reviewed: I have personally reviewed following labs and imaging studies  CBC Lab Results  Component Value Date   WBC 6.9 03/09/2024   RBC 2.60 (L) 03/09/2024   HGB 7.9 (L) 03/09/2024   HCT 23.6 (L) 03/09/2024   MCV 90.8 03/09/2024   MCH 30.4 03/09/2024   PLT 254 03/09/2024   MCHC 33.5 03/09/2024   RDW 14.9 03/09/2024   LYMPHSABS 2.7 12/10/2023  MONOABS 0.7 12/10/2023   EOSABS 0.1 12/10/2023   BASOSABS 0.0 12/10/2023     Last metabolic panel Lab Results  Component Value Date   NA 140 03/09/2024    K 3.2 (L) 03/09/2024   CL 103 03/09/2024   CO2 25 03/09/2024   BUN 15 03/09/2024   CREATININE 0.73 03/09/2024   GLUCOSE 144 (H) 03/09/2024   GFRNONAA >60 03/09/2024   GFRAA >60 04/12/2019   CALCIUM  8.3 (L) 03/09/2024   PROT 6.1 (L) 03/09/2024   ALBUMIN 3.8 03/09/2024   BILITOT 0.5 03/09/2024   ALKPHOS 82 03/09/2024   AST 18 03/09/2024   ALT 11 03/09/2024   ANIONGAP 12 03/09/2024    GFR: Estimated Creatinine Clearance: 54.2 mL/min (by C-G formula based on SCr of 0.73 mg/dL).  No results found for this or any previous visit (from the past 240 hours).    Radiology Studies: No results found.    LOS: 0 days    Elgin Lam, MD [facility] 03/10/2024, 7:46 AM   If 7PM-7AM, please contact night-coverage [email or web address]  "

## 2024-03-10 NOTE — Care Management Obs Status (Signed)
 MEDICARE OBSERVATION STATUS NOTIFICATION   Patient Details  Name: Desiree Leon MRN: [id number] Date of Birth: 06-01-1940   Medicare Observation Status Notification Given:  Yes    Vonzell Arrie Sharps 03/10/2024, 12:17 PM

## 2024-03-11 DIAGNOSIS — K922 Gastrointestinal hemorrhage, unspecified: Secondary | ICD-10-CM | POA: Diagnosis not present

## 2024-03-11 LAB — CBC
HCT: 25 % — ABNORMAL LOW (ref 36.0–46.0)
Hemoglobin: 8 g/dL — ABNORMAL LOW (ref 12.0–15.0)
MCH: 30.2 pg (ref 26.0–34.0)
MCHC: 32 g/dL (ref 30.0–36.0)
MCV: 94.3 fL (ref 80.0–100.0)
Platelets: 231 K/uL (ref 150–400)
RBC: 2.65 MIL/uL — ABNORMAL LOW (ref 3.87–5.11)
RDW: 15.3 % (ref 11.5–15.5)
WBC: 6.7 K/uL (ref 4.0–10.5)
nRBC: 0 % (ref 0.0–0.2)

## 2024-03-11 LAB — GLUCOSE, CAPILLARY
Glucose-Capillary: 137 mg/dL — ABNORMAL HIGH (ref 70–99)
Glucose-Capillary: 158 mg/dL — ABNORMAL HIGH (ref 70–99)
Glucose-Capillary: 93 mg/dL (ref 70–99)

## 2024-03-11 MED ORDER — STERILE WATER FOR INJECTION IJ SOLN
INTRAMUSCULAR | Status: AC
  Administered 2024-03-11: 10 mL
  Filled 2024-03-11: qty 10

## 2024-03-11 MED ORDER — ALBUTEROL SULFATE (2.5 MG/3ML) 0.083% IN NEBU: 3.0000 mL | INHALATION_SOLUTION | RESPIRATORY_TRACT | Status: DC | PRN

## 2024-03-11 MED ORDER — DOCUSATE SODIUM 100 MG PO CAPS
100.0000 mg | ORAL_CAPSULE | Freq: Every day | ORAL | Status: DC
  Administered 2024-03-11 – 2024-03-15 (×4): 100 mg via ORAL
  Filled 2024-03-11 (×4): qty 1

## 2024-03-11 NOTE — Plan of Care (Signed)

## 2024-03-11 NOTE — TOC Initial Note (Signed)
 Transition of Care [facility]) - Initial/Assessment Note    Patient Details  Name: Desiree Leon MRN: [id number] Date of Birth: January 12, 1941  [facility]) CM/SW Contact:    Desiree JULIANNA George, RN Phone Number: 03/11/2024, 11:54 AM  Clinical Narrative:                 Desiree Leon is a 84 y.o. female with medical history significant of hypertension, hyperlipidemia, CVA, diabetes, hypothyroidism, CKD 3A, carotid artery disease, PAD, mesenteric artery stenosis status post stent, CAD with CTO, CHF, GI bleed, depression, anxiety, anemia, COPD, chronic respiratory failure with hypoxia, PE presenting with rectal bleeding.   Pt is from home alone. Her daughter lives next door and works from pts home. Granddaughter lives behind her and works from home.  Pt uses oxygen  3L [place] through [employer].  Daughter provides needed transportation.  Pt manages her own medications and denies any issues.  Ip care management following.  Expected Discharge Plan: Home/Self Care Barriers to Discharge: Continued Medical Work up   Patient Goals and CMS Choice            Expected Discharge Plan and Services       Living arrangements for the past 2 months: Single Family Home                                      Prior Living Arrangements/Services Living arrangements for the past 2 months: Single Family Home Lives with:: Self Patient language and need for interpreter reviewed:: Yes Do you feel safe going back to the place where you live?: Yes        Care giver support system in place?: Yes (comment) Current home services: DME (bars in shower/ cane/ rollator/ shower seat/ oxygen ) Criminal Activity/Legal Involvement Pertinent to Current Situation/Hospitalization: No - Comment as needed  Activities of Daily Living   ADL Screening (condition at time of admission) Independently performs ADLs?: Yes (appropriate for developmental age) Is the patient deaf or have difficulty hearing?: No Does the  patient have difficulty seeing, even when wearing glasses/contacts?: Yes (eye glasses) Does the patient have difficulty concentrating, remembering, or making decisions?: Yes (some)  Permission Sought/Granted                  Emotional Assessment Appearance:: Appears stated age Attitude/Demeanor/Rapport: Engaged Affect (typically observed): Accepting Orientation: : Oriented to Self, Oriented to Place, Oriented to  Time, Oriented to Situation   Psych Involvement: No (comment)  Admission diagnosis:  Diverticula, colon [K57.30] Rectal bleeding [K62.5] Acute GI bleeding [K92.2] History of hemorrhoids [Z87.19] Severe anemia [D64.9] Symptomatic anemia [D64.9] Current use of anticoagulant therapy [Z79.01] Patient Active Problem List   Diagnosis Date Noted   CAD (coronary artery disease) 03/09/2024   Diverticulosis 03/09/2024   Esophageal dysmotility 03/09/2024   History of colonic polyps 03/09/2024   Mesenteric artery stenosis 03/09/2024   CKD stage 3a, GFR 45-59 ml/min (HCC) 03/09/2024   Acute GI bleeding 03/09/2024   Chronic respiratory failure (HCC) 03/09/2024   History of CVA (cerebrovascular accident) 04/04/2023   Symptomatic anemia 04/23/2022   Lower GI bleed requiring more than 4 units of blood in 24 hours, ICU, or surgery 04/23/2022   Depression 05/22/2021   Low back pain 09/26/2019   Gastro-esophageal reflux disease without esophagitis 04/21/2019   Carotid artery occlusion 04/21/2019   History of pulmonary embolus (PE) 04/12/2019   History of iron  deficiency  anemia 04/11/2019   Pneumococcal pneumonia 04/04/2019   COPD (chronic obstructive pulmonary disease) (HCC)    Dysphagia 10/18/2018   Disequilibrium 11/10/2017   History of fall 11/10/2017   Oxygen  dependent 08/27/2017   Overweight 05/04/2017   Chronic anemia 01/02/2017   Dyspnea 08/12/2016   Chronic combined systolic and diastolic heart failure (HCC) 05/14/2015   Hardening of the aorta (main artery of the  heart) 05/14/2015   Type 2 diabetes mellitus with other circulatory complications (HCC) 01/08/2015   Encounter for general adult medical examination without abnormal findings 01/01/2015   Sedative, hypnotic or anxiolytic dependence, uncomplicated (HCC) 07/05/2014   Unstable angina (HCC) 05/08/2014   Anxiety disorder 04/26/2014   Mitral valve prolapse 04/18/2014   Dyslipidemia 04/18/2014   Peripheral venous insufficiency 12/05/2008   Hypothyroidism 12/04/2008   Essential hypertension 12/04/2008   Hyperlipidemia 12/04/2008   Osteoarthritis 12/04/2008   PCP:  Shepard Ade, MD Pharmacy:   [facility] [place], [place] - [address] [facility] [address] [place] [address] Phone: [phone] Fax: [phone]     Social Drivers of Health (SDOH) Social History: SDOH Screenings   Food Insecurity: No Food Insecurity (03/09/2024)  Housing: Low Risk (03/09/2024)  Transportation Needs: No Transportation Needs (03/09/2024)  Utilities: Not At Risk (03/09/2024)  Social Connections: Socially Isolated (03/09/2024)  Tobacco Use: Medium Risk (03/09/2024)   SDOH Interventions:     Readmission Risk Interventions     No data to display

## 2024-03-11 NOTE — Progress Notes (Signed)
 "  PROGRESS NOTE    Desiree Leon  FMW:[id number] DOB: 02-Oct-1940 DOA: 03/09/2024 PCP: Shepard Ade, MD   Brief Narrative: Desiree Leon is a 84 y.o. female with a history of hypertension, hyperlipidemia, stroke, diabetes mellitus type 2, hypothyroidism, CKD stage IIIa, CAD, carotid artery stenosis, PAD, mesenteric stenosis s/p stent, heart failure, GI bleeding, anxiety, depression, COPD, chronic respiratory failure on oxygen .  Patient presented secondary to GI bleeding with associated anemia. GI consulted and capsule endoscopy performed on 03/10/24.   Assessment and Plan:  GI bleed Patient with a history of prior GI bleed with workup significant for diverticulosis, polyps and ectatic lesions, now presenting with dark and bright red stools. Complicated by aspirin , Plavix  and Eliquis  use. Gastroenterology consulted on admission. - GI recommendations: capsule endoscopy (ingested on 1/1), goal hgb >7  Chronic normocytic anemia Acute blood loss anemia Baseline hemoglobin around 10 g/dL. Hemoglobin of 7.4 g/dL on admission with improvement to 7.9 g/dL after 1 unit of PRBC via transfusion.  Chronic respiratory failure with hypoxia Patient is managed on 3 L/min of supplemental oxygen  at baseline. Stable. - Continue oxygen   Primary hypertension - Continue metoprolol  tartrate  Hyperlipidemia - Continue Zetia   Diabetes mellitus type 2 Well controlled based on hemoglobin A1C of 6.4% from January 2025.  Possible aspiration Patient with cough after eating and history of choking on her saliva. She reports never having been evaluated by speech therapy. -SLP evaluation  Hypothyroidism - Continue  Synthroid  137 mcg daily  CAD Currently no chest pain. Last heart catheterization from April 2025. Patient is managed on aspirin , Crestor , Zetia , Ranexa , metoprolol , but is also on Plavix  from per vascular surgery placement of a mesenteric artery stent. - Continue Ranexa   PAD Mesenteric  artery stenosis Patient follows with vascular surgery as an outpatient with recent stent placement on 12/9. Patient was placed on Aspirin , Plavix  and maintained on Eliquis  with plan to continue triple therapy until 03/18/24. Plavix  and Eliquis  held secondary to GI bleeding. Vascular surgery contacted this admission and recommended to continue Aspirin . - Continue Aspirin   Chronic HFpEF Stable.  Carotid artery disease - Continue Crestor  and Zetia   Depression Anxiety - Continue Lexapro   History of PE Patient is managed on Eliquis  as an outpatient which is held secondary to GI bleeding.   DVT prophylaxis: SCDs Code Status:   Code Status: Full Code Family Communication: Daughter at bedside Disposition Plan: Discharge pending ongoing GI recommendations/management, stable hemoglobin and resolution of GI bleeding   Consultants:  [facility]  Procedures:  None  Antimicrobials: None    Subjective: No bowel movement. Some dyspnea earlier this morning which improved with a breathing treatment.  Objective: BP (!) 137/59 (BP Location: Left Arm)   Pulse (!) 58   Temp 98.8 F (37.1 C) (Oral)   Resp 17   Ht 5' 3 (1.6 m)   Wt 85.3 kg   SpO2 99%   BMI 33.31 kg/m   Examination:  General exam: Appears calm and comfortable. Respiratory system: Diminished but without wheezing or rhonchi. Respiratory effort normal. Cardiovascular system: S1 & S2 heard, RRR. Gastrointestinal system: Abdomen is distended, soft and nontender. Normal bowel sounds heard. Central nervous system: Alert and oriented. No focal neurological deficits. Psychiatry: Judgement and insight appear normal. Mood & affect appropriate.    Data Reviewed: I have personally reviewed following labs and imaging studies  CBC Lab Results  Component Value Date   WBC 6.7 03/11/2024   RBC 2.65 (L) 03/11/2024   HGB 8.0 (L)  03/11/2024   HCT 25.0 (L) 03/11/2024   MCV 94.3 03/11/2024   MCH 30.2 03/11/2024   PLT  231 03/11/2024   MCHC 32.0 03/11/2024   RDW 15.3 03/11/2024   LYMPHSABS 2.7 12/10/2023   MONOABS 0.7 12/10/2023   EOSABS 0.1 12/10/2023   BASOSABS 0.0 12/10/2023     Last metabolic panel Lab Results  Component Value Date   NA 140 03/10/2024   K 3.9 03/10/2024   CL 106 03/10/2024   CO2 26 03/10/2024   BUN 9 03/10/2024   CREATININE 0.71 03/10/2024   GLUCOSE 130 (H) 03/10/2024   GFRNONAA >60 03/10/2024   GFRAA >60 04/12/2019   CALCIUM  8.1 (L) 03/10/2024   PROT 5.3 (L) 03/10/2024   ALBUMIN 3.3 (L) 03/10/2024   BILITOT 0.8 03/10/2024   ALKPHOS 73 03/10/2024   AST 20 03/10/2024   ALT 8 03/10/2024   ANIONGAP 8 03/10/2024    GFR: Estimated Creatinine Clearance: 54.2 mL/min (by C-G formula based on SCr of 0.71 mg/dL).  No results found for this or any previous visit (from the past 240 hours).    Radiology Studies: No results found.    LOS: 0 days    Elgin Lam, MD [facility] 03/11/2024, 10:59 AM   If 7PM-7AM, please contact night-coverage [email or web address]  "

## 2024-03-11 NOTE — Progress Notes (Signed)
 [facility] Progress Note  SUBJECTIVE:   Interval history: Desiree Leon was seen and evaluated today at bedside. Resting comfortably in bed. Daughter and family at bedside. Abdominal pain, persistent. Shortness of breath. Nausea today with eating, no emesis. Had brown bowel movement with some black pieces.   Video capsule endoscopy read today - mild duodenitis and enteritis, no signs of active GI bleeding in small intestine. Results discussed with patient and family.   Past Medical History:  Diagnosis Date   Acute diastolic heart failure (HCC) 03/09/2024   Acute pulmonary embolism (HCC) 05/21/2021   Anxiety    Blood transfusion    CAP (community acquired pneumonia)    COPD (chronic obstructive pulmonary disease) (HCC)    Heart murmur    History of colon polyps    Hyperlipidemia    Hypertension    MI (myocardial infarction) (HCC)    Mitral valve prolapse    NSTEMI (non-ST elevated myocardial infarction) (HCC)    Osteoarthritis    Osteoporosis    Pulmonary emboli (HCC) 04/12/2019   Spinal stenosis    Thyroid  disease    hypothyroidism   Unstable angina (HCC) 05/08/2014   Venous insufficiency    Past Surgical History:  Procedure Laterality Date   ABDOMINAL AORTOGRAM N/A 02/16/2024   Procedure: ABDOMINAL AORTOGRAM;  Surgeon: Serene Gaile ORN, MD;  Location: [facility];  Service: Cardiovascular;  Laterality: N/A;   ABDOMINAL AORTOGRAM W/LOWER EXTREMITY N/A 12/29/2023   Procedure: ABDOMINAL AORTOGRAM W/LOWER EXTREMITY;  Surgeon: Serene Gaile ORN, MD;  Location: [facility];  Service: Cardiovascular;  Laterality: N/A;   ABDOMINAL HYSTERECTOMY  11/26/1976   APPENDECTOMY  02/14/1978   BASAL CELL CARCINOMA EXCISION  01/26/1990   COLONOSCOPY  1993, 2003, 2008, 2012   COLONOSCOPY WITH PROPOFOL  N/A 04/26/2022   Procedure: COLONOSCOPY WITH PROPOFOL ;  Surgeon: Elicia Claw, MD;  Location: [facility];  Service: Gastroenterology;  Laterality: N/A;   CORONARY  ANGIOGRAPHY N/A 06/16/2023   Procedure: CORONARY ANGIOGRAPHY;  Surgeon: Elmira Newman PARAS, MD;  Location: [facility];  Service: Cardiovascular;  Laterality: N/A;   CORONARY ULTRASOUND/IVUS N/A 12/29/2023   Procedure: Coronary Ultrasound/IVUS;  Surgeon: Serene Gaile ORN, MD;  Location: [facility];  Service: Cardiovascular;  Laterality: N/A;   DILATION AND CURETTAGE OF UTERUS  09/1976   ESOPHAGOGASTRODUODENOSCOPY (EGD) WITH PROPOFOL  N/A 04/25/2022   Procedure: ESOPHAGOGASTRODUODENOSCOPY (EGD) WITH PROPOFOL ;  Surgeon: Burnette Fallow, MD;  Location: [facility];  Service: Gastroenterology;  Laterality: N/A;   GIVENS CAPSULE STUDY N/A 03/10/2024   Procedure: IMAGING PROCEDURE, GI TRACT, INTRALUMINAL, VIA CAPSULE;  Surgeon: Kriss Estefana DEL, DO;  Location: [facility];  Service: Gastroenterology;  Laterality: N/A;   HEMOSTASIS CONTROL  04/26/2022   Procedure: HEMOSTASIS CONTROL;  Surgeon: Elicia Claw, MD;  Location: [facility];  Service: Gastroenterology;;   HOT HEMOSTASIS N/A 04/26/2022   Procedure: HOT HEMOSTASIS (ARGON PLASMA COAGULATION/BICAP);  Surgeon: Elicia Claw, MD;  Location: [facility] ENDOSCOPY;  Service: Gastroenterology;  Laterality: N/A;   LEFT HEART CATHETERIZATION WITH CORONARY ANGIOGRAM N/A 04/18/2014   Procedure: LEFT HEART CATHETERIZATION WITH CORONARY ANGIOGRAM;  Surgeon: Peter M Jordan, MD;  Location[facility];  Service: Cardiovascular;  Laterality: N/A;   LOWER EXTREMITY ANGIOGRAPHY N/A 12/29/2023   Procedure: Lower Extremity Angiography;  Surgeon: Serene Gaile ORN, MD;  [facility]: [facility];  Service: Cardiovascular;  Laterality: N/A;   MASS EXCISION  10/20/2011   Procedure: MINOR EXCISION OF MASS;  Surgeon: Elon CHRISTELLA Pacini, MD;  Location: [facility];  Service: General;  Laterality: N/A;  Excision mass posterior neck   OOPHORECTOMY  02/14/1978   POLYPECTOMY  04/26/2022   Procedure: POLYPECTOMY;  Surgeon: Elicia Claw, MD;   Location: [facility];  Service: Gastroenterology;;   SHOULDER SURGERY  10/18/2007   basal cell   SQUAMOUS CELL CARCINOMA EXCISION     TONSILLECTOMY  1955   TUBAL LIGATION  07/08/74   VISCERAL ANGIOGRAPHY N/A 02/16/2024   Procedure: VISCERAL ANGIOGRAPHY;  Surgeon: Serene Gaile ORN, MD;  Location: [facility];  Service: Cardiovascular;  Laterality: N/A;   VISCERAL ARTERY INTERVENTION N/A 02/16/2024   Procedure: VISCERAL ARTERY INTERVENTION;  Surgeon: Serene Gaile ORN, MD;  Location: [facility];  Service: Cardiovascular;  Laterality: N/A;   Current Facility-Administered Medications  Medication Dose Route Frequency Provider Last Rate Last Admin   acetaminophen  (TYLENOL ) tablet 650 mg  650 mg Oral Q6H PRN Seena Marsa NOVAK, MD       Or   acetaminophen  (TYLENOL ) suppository 650 mg  650 mg Rectal Q6H PRN Seena Marsa NOVAK, MD       albuterol  (PROVENTIL ) (2.5 MG/3ML) 0.083% nebulizer solution 3 mL  3 mL Inhalation Q6H PRN Seena Marsa NOVAK, MD   3 mL at 03/11/24 1546   aspirin  EC tablet 81 mg  81 mg Oral Daily Melvin, Alexander B, MD   81 mg at 03/11/24 1050   docusate sodium (COLACE) capsule 100 mg  100 mg Oral Daily Blakely Maranan H, DO   100 mg at 03/11/24 1050   escitalopram  (LEXAPRO ) tablet 5 mg  5 mg Oral QPM Melvin, Alexander B, MD   5 mg at 03/10/24 1719   ezetimibe  (ZETIA ) tablet 10 mg  10 mg Oral Daily Melvin, Alexander B, MD   10 mg at 03/11/24 1046   fluticasone furoate-vilanterol (BREO ELLIPTA) 200-25 MCG/ACT 1 puff  1 puff Inhalation Daily Seena Marsa NOVAK, MD   1 puff at 03/11/24 0913   levothyroxine  (SYNTHROID ) tablet 137 mcg  137 mcg Oral Q24H Seena Marsa NOVAK, MD   137 mcg at 03/11/24 0608   metoprolol  tartrate (LOPRESSOR ) tablet 12.5 mg  12.5 mg Oral BID Briana Elgin LABOR, MD   12.5 mg at 03/11/24 1046   nitroGLYCERIN  (NITRODUR - Dosed in mg/24 hr) patch 0.4 mg  0.4 mg Transdermal Q24H Briana Elgin LABOR, MD   0.4 mg at 03/11/24 1048   pantoprazole  (PROTONIX )  injection 40 mg  40 mg Intravenous Q12H Melvin, Alexander B, MD   40 mg at 03/11/24 1049   ranolazine  (RANEXA ) 12 hr tablet 500 mg  500 mg Oral BID Melvin, Alexander B, MD   500 mg at 03/11/24 1048   rosuvastatin  (CRESTOR ) tablet 40 mg  40 mg Oral QPM Melvin, Alexander B, MD   40 mg at 03/10/24 1719   sodium chloride  flush (NS) 0.9 % injection 3 mL  3 mL Intravenous Q12H Seena Marsa NOVAK, MD   3 mL at 03/11/24 1049   Allergies as of 03/09/2024 - Review Complete 03/09/2024  Allergen Reaction Noted   Other Anaphylaxis and Cough 05/09/2014   Dilaudid [hydromorphone hcl] Nausea Only 05/09/2014   Erythromycin Nausea And Vomiting 04/23/2020   Diflucan [fluconazole] Palpitations and Hypertension 04/04/2023   Review of Systems:  Review of Systems  Respiratory:  Positive for shortness of breath.   Cardiovascular:  Negative for chest pain.  Gastrointestinal:  Positive for abdominal pain and nausea. Negative for vomiting.    OBJECTIVE:  Temp:  [97.8 F (36.6 C)-99.4 F (37.4 C)] 98.3 F (36.8 C) (01/02 1223) Pulse Rate:  [53-86] 55 (01/02 1223) Resp:  [16-18] 17 (01/02 1223) BP: (98-157)/(32-59) 139/50 (01/02 1223) SpO2:  [94 %-100 %] 99 % (01/02 1223) Last BM Date : 03/11/24 Physical Exam Constitutional:      General: She is not in acute distress.    Appearance: She is not ill-appearing, toxic-appearing or diaphoretic.  Cardiovascular:     Rate and Rhythm: Normal rate.  Pulmonary:     Effort: No respiratory distress.     Breath sounds: Normal breath sounds.     Comments: Supplemental oxygen  nasal cannula. Abdominal:     General: Bowel sounds are normal. There is no distension.     Palpations: Abdomen is soft.     Tenderness: There is no abdominal tenderness. There is no guarding.  Skin:    General: Skin is warm and dry.     Coloration: Skin is pale.  Neurological:     Mental Status: She is alert.     Labs: Recent Labs    03/09/24 1849 03/10/24 0751 03/10/24 2126  03/11/24 0150  WBC 6.9 6.7  --  6.7  HGB 7.9* 7.8* 7.8* 8.0*  HCT 23.6* 23.8* 24.0* 25.0*  PLT 254 230  --  231   BMET Recent Labs    03/09/24 1032 03/10/24 0751  NA 140 140  K 3.2* 3.9  CL 103 106  CO2 25 26  GLUCOSE 144* 130*  BUN 15 9  CREATININE 0.73 0.71  CALCIUM  8.3* 8.1*   LFT Recent Labs    03/10/24 0751  PROT 5.3*  ALBUMIN 3.3*  AST 20  ALT 8  ALKPHOS 73  BILITOT 0.8   PT/INR Recent Labs    03/09/24 1032  LABPROT 15.7*  INR 1.2   Diagnostic imaging: No results found.  IMPRESSION: Melena, resolved Hematochezia, resolved  Acute blood loss anemia, stable SMA stenosis status post stenting 02/08/2024             - On triple therapy with Eliquis , aspirin  and Plavix  COPD on 3 L supplemental oxygen  at baseline History of coronary artery disease  PLAN: -No signs of active GI blood loss on video capsule endoscopy, recommend updating colonoscopy to rule out further colonic AVM, patient and her family agreeable -Ate solid food today, would not be clean/prep for colonoscopy 03/12/24, lack of endoscopy availability 03/13/24, will plan for colonoscopy 03/14/24 and CLD/bowel preparation on 03/13/24 -Hold anticoagulant therapy, ok for heparin  drip if IM recommends (would need to be paused prior to colonoscopy) -Trend H/H, transfuse for Hgb < 7 -Dr. Dianna to evaluate tomorrow   LOS: 0 days   Estefana Keas, DO [facility]

## 2024-03-11 NOTE — Evaluation (Signed)
 Clinical/Bedside Swallow Evaluation Patient Details  Name: GARIMA CHRONIS MRN: [id number] Date of Birth: 12-18-1940  Today's Date: 03/11/2024 Time: SLP Start Time (ACUTE ONLY): 1430 SLP Stop Time (ACUTE ONLY): 1448 SLP Time Calculation (min) (ACUTE ONLY): 18 min  Past Medical History:  Past Medical History:  Diagnosis Date   Acute diastolic heart failure (HCC) 03/09/2024   Acute pulmonary embolism (HCC) 05/21/2021   Anxiety    Blood transfusion    CAP (community acquired pneumonia)    COPD (chronic obstructive pulmonary disease) (HCC)    Heart murmur    History of colon polyps    Hyperlipidemia    Hypertension    MI (myocardial infarction) (HCC)    Mitral valve prolapse    NSTEMI (non-ST elevated myocardial infarction) (HCC)    Osteoarthritis    Osteoporosis    Pulmonary emboli (HCC) 04/12/2019   Spinal stenosis    Thyroid  disease    hypothyroidism   Unstable angina (HCC) 05/08/2014   Venous insufficiency    Past Surgical History:  Past Surgical History:  Procedure Laterality Date   ABDOMINAL AORTOGRAM N/A 02/16/2024   Procedure: ABDOMINAL AORTOGRAM;  Surgeon: Serene Gaile ORN, MD;  Location: [facility];  Service: Cardiovascular;  Laterality: N/A;   ABDOMINAL AORTOGRAM W/LOWER EXTREMITY N/A 12/29/2023   Procedure: ABDOMINAL AORTOGRAM W/LOWER EXTREMITY;  Surgeon: Serene Gaile ORN, MD;  Location: [facility];  Service: Cardiovascular;  Laterality: N/A;   ABDOMINAL HYSTERECTOMY  11/26/1976   APPENDECTOMY  02/14/1978   BASAL CELL CARCINOMA EXCISION  01/26/1990   COLONOSCOPY  1993, 2003, 2008, 2012   COLONOSCOPY WITH PROPOFOL  N/A 04/26/2022   Procedure: COLONOSCOPY WITH PROPOFOL ;  Surgeon: Elicia Claw, MD;  Location: [facility];  Service: Gastroenterology;  Laterality: N/A;   CORONARY ANGIOGRAPHY N/A 06/16/2023   Procedure: CORONARY ANGIOGRAPHY;  Surgeon: Elmira Newman PARAS, MD;  Location: [facility];  Service: Cardiovascular;  Laterality: N/A;    CORONARY ULTRASOUND/IVUS N/A 12/29/2023   Procedure: Coronary Ultrasound/IVUS;  Surgeon: Serene Gaile ORN, MD;  [facility]: [facility];  Service: Cardiovascular;  Laterality: N/A;   DILATION AND CURETTAGE OF UTERUS  09/1976   ESOPHAGOGASTRODUODENOSCOPY (EGD) WITH PROPOFOL  N/A 04/25/2022   Procedure: ESOPHAGOGASTRODUODENOSCOPY (EGD) WITH PROPOFOL ;  Surgeon: Burnette Fallow, MD;  Location: [facility] ENDOSCOPY;  Service: Gastroenterology;  Laterality: N/A;   GIVENS CAPSULE STUDY N/A 03/10/2024   Procedure: IMAGING PROCEDURE, GI TRACT, INTRALUMINAL, VIA CAPSULE;  Surgeon: Kriss Estefana DEL, DO;  Location: [facility];  Service: Gastroenterology;  Laterality: N/A;   HEMOSTASIS CONTROL  04/26/2022   Procedure: HEMOSTASIS CONTROL;  Surgeon: Elicia Claw, MD;  Location: [facility];  Service: Gastroenterology;;   HOT HEMOSTASIS N/A 04/26/2022   Procedure: HOT HEMOSTASIS (ARGON PLASMA COAGULATION/BICAP);  Surgeon: Elicia Claw, MD;  Location: [facility] ENDOSCOPY;  Service: Gastroenterology;  Laterality: N/A;   LEFT HEART CATHETERIZATION WITH CORONARY ANGIOGRAM N/A 04/18/2014   Procedure: LEFT HEART CATHETERIZATION WITH CORONARY ANGIOGRAM;  Surgeon: Peter M Jordan, MD;  Location: [facility] CATH LAB;  Service: Cardiovascular;  Laterality: N/A;   LOWER EXTREMITY ANGIOGRAPHY N/A 12/29/2023   Procedure: Lower Extremity Angiography;  Surgeon: Serene Gaile ORN, MD;  [facility]: [facility];  Service: Cardiovascular;  Laterality: N/A;   MASS EXCISION  10/20/2011   Procedure: MINOR EXCISION OF MASS;  Surgeon: Elon CHRISTELLA Pacini, MD;  Location: [facility];  Service: General;  Laterality: N/A;  Excision mass posterior neck   OOPHORECTOMY  02/14/1978   POLYPECTOMY  04/26/2022  Procedure: POLYPECTOMY;  Surgeon: Elicia Claw, MD;  Location: [facility];  Service: Gastroenterology;;   SHOULDER SURGERY  10/18/2007   basal cell   SQUAMOUS CELL CARCINOMA EXCISION     TONSILLECTOMY  1955   TUBAL LIGATION   07/08/74   VISCERAL ANGIOGRAPHY N/A 02/16/2024   Procedure: VISCERAL ANGIOGRAPHY;  Surgeon: Serene Gaile ORN, MD;  [facility]: [facility];  Service: Cardiovascular;  Laterality: N/A;   VISCERAL ARTERY INTERVENTION N/A 02/16/2024   Procedure: VISCERAL ARTERY INTERVENTION;  Surgeon: Serene Gaile ORN, MD;  [facility]: [facility];  Service: Cardiovascular;  Laterality: N/A;   HPI:  KAMEELA LEIPOLD is a 84 y.o. female who presented secondary to GI bleeding with associated anemia. GI consulted and capsule endoscopy performed on 03/10/24. diet advanced later on 1/1. Pt with a history of hypertension, hyperlipidemia, stroke, diabetes mellitus type 2, hypothyroidism, CKD stage IIIa, CAD, carotid artery stenosis, PAD, mesenteric stenosis s/p stent, heart failure, GI bleeding, anxiety, depression, COPD, chronic respiratory failure on oxygen .    Assessment / Plan / Recommendation  Clinical Impression  Pt reports frequent coughing with liquids and a feeling of chest tightness and fullness/pressure with food. Upon observation pt is able to drink 3 oz of water consecutively without coughing. In her chart pt has a history of dysmotility and reduced cricopharyngeal relaxation. Risk of aspiration is low, but risk of decreased caloric intake and hydration from dysphagia is moderate. Given history recommend f/u with repeat esophageal assessment and if a problem is identified, consideration for f/u with ENT to address cricopharyngeal opening. SLP reviewed esophageal precautions and strategies to reduce coughing episodes, which pt is alread trying to utilize. Provided written and verbal education. Will sign off acutely.   SLP Visit Diagnosis: Dysphagia, pharyngoesophageal phase (R13.14)    Aspiration Risk  Risk for inadequate nutrition/hydration    Diet Recommendation           Other Recommendations       Swallow Evaluation Recommendations Recommendations: PO diet PO Diet Recommendation: Regular;Thin  liquids (Level 0) Liquid Administration via: Cup;Straw Medication Administration: Whole meds with puree Supervision: Patient able to self-feed Swallowing strategies  : Slow rate;Small bites/sips Postural changes: Position pt fully upright for meals;Stay upright 30-60 min after meals;Out of bed for meals Oral care recommendations: Oral care BID (2x/day)   Assistance Recommended at Discharge    Functional Status Assessment    Frequency and Duration            Prognosis        Swallow Study   General HPI: DYAN LABARBERA is a 84 y.o. female who presented secondary to GI bleeding with associated anemia. GI consulted and capsule endoscopy performed on 03/10/24. diet advanced later on 1/1. Pt with a history of hypertension, hyperlipidemia, stroke, diabetes mellitus type 2, hypothyroidism, CKD stage IIIa, CAD, carotid artery stenosis, PAD, mesenteric stenosis s/p stent, heart failure, GI bleeding, anxiety, depression, COPD, chronic respiratory failure on oxygen . Type of Study: Bedside Swallow Evaluation Diet Prior to this Study: Regular;Thin liquids (Level 0) Temperature Spikes Noted: No Respiratory Status: Room air History of Recent Intubation: No Behavior/Cognition: Alert;Cooperative;Pleasant mood Oral Cavity Assessment: Within Functional Limits Oral Care Completed by SLP: No Oral Cavity - Dentition: Adequate natural dentition Vision: Functional for self-feeding Self-Feeding Abilities: Able to feed self Patient Positioning: Upright in bed Baseline Vocal Quality: Normal Volitional Cough: Strong Volitional Swallow: Able to elicit    Oral/Motor/Sensory Function     Ice Chips  Thin Liquid Thin Liquid: Within functional limits Presentation: Straw;Self Fed    Nectar Thick Nectar Thick Liquid: Not tested   Honey Thick Honey Thick Liquid: Not tested   Puree Puree: Not tested   Solid     Solid: Not tested      Natlie Asfour, Consuelo Fitch 03/11/2024,3:21 PM

## 2024-03-12 ENCOUNTER — Observation Stay (HOSPITAL_COMMUNITY)

## 2024-03-12 DIAGNOSIS — K922 Gastrointestinal hemorrhage, unspecified: Secondary | ICD-10-CM | POA: Diagnosis not present

## 2024-03-12 LAB — GLUCOSE, CAPILLARY
Glucose-Capillary: 187 mg/dL — ABNORMAL HIGH (ref 70–99)
Glucose-Capillary: 129 mg/dL — ABNORMAL HIGH (ref 70–99)
Glucose-Capillary: 201 mg/dL — ABNORMAL HIGH (ref 70–99)

## 2024-03-12 LAB — CBC
HCT: 24.9 % — ABNORMAL LOW (ref 36.0–46.0)
Hemoglobin: 7.8 g/dL — ABNORMAL LOW (ref 12.0–15.0)
MCH: 30.1 pg (ref 26.0–34.0)
MCHC: 31.3 g/dL (ref 30.0–36.0)
MCV: 96.1 fL (ref 80.0–100.0)
Platelets: 212 K/uL (ref 150–400)
RBC: 2.59 MIL/uL — ABNORMAL LOW (ref 3.87–5.11)
RDW: 15.2 % (ref 11.5–15.5)
WBC: 7.2 K/uL (ref 4.0–10.5)
nRBC: 0 % (ref 0.0–0.2)

## 2024-03-12 LAB — PREPARE RBC (CROSSMATCH)

## 2024-03-12 LAB — HEMOGLOBIN AND HEMATOCRIT, BLOOD
HCT: 27.4 % — ABNORMAL LOW (ref 36.0–46.0)
Hemoglobin: 8.8 g/dL — ABNORMAL LOW (ref 12.0–15.0)

## 2024-03-12 MED ORDER — SODIUM CHLORIDE 0.9 % IV SOLN
INTRAVENOUS | Status: AC
  Administered 2024-03-12: 10 mL/h via INTRAVENOUS

## 2024-03-12 MED ORDER — BENZONATATE 100 MG PO CAPS
200.0000 mg | ORAL_CAPSULE | Freq: Three times a day (TID) | ORAL | Status: DC
  Administered 2024-03-12 – 2024-03-15 (×10): 200 mg via ORAL
  Filled 2024-03-12 (×10): qty 2

## 2024-03-12 MED ORDER — SODIUM CHLORIDE 0.9% IV SOLUTION: Freq: Once | INTRAVENOUS | Status: AC

## 2024-03-12 MED ORDER — PEG 3350-KCL-NA BICARB-NACL 420 G PO SOLR
4000.0000 mL | Freq: Once | ORAL | Status: AC
  Administered 2024-03-13: 4000 mL via ORAL
  Filled 2024-03-12: qty 4000

## 2024-03-12 MED ORDER — STERILE WATER FOR INJECTION IJ SOLN
INTRAMUSCULAR | Status: AC
  Administered 2024-03-12: 10 mL
  Filled 2024-03-12: qty 10

## 2024-03-12 MED ORDER — IPRATROPIUM-ALBUTEROL 0.5-2.5 (3) MG/3ML IN SOLN
3.0000 mL | Freq: Four times a day (QID) | RESPIRATORY_TRACT | Status: DC | PRN
  Filled 2024-03-12: qty 3

## 2024-03-12 NOTE — H&P (View-Only) (Signed)
 [facility] Gastroenterology Progress Note  [name] 84 y.o. 08-05-1940   Subjective: Lower abdominal pain for 2 years. Family at bedside.   Objective: Vital signs: Vitals:   03/12/24 0856 03/12/24 1201  BP:  (!) 132/55  Pulse:  (!) 51  Resp:    Temp:  98.8 F (37.1 C)  SpO2: 99% 100%    Physical Exam: Gen: lethargic, elderly, obese, no acute distress, pleasant HEENT: anicteric sclera CV: RRR Chest: CTA B Abd: RLQ and LLQ tenderness (greatest in LLQ) with guarding, soft, nondistended, +BS Ext: no edema  Lab Results: Recent Labs    03/10/24 0751  NA 140  K 3.9  CL 106  CO2 26  GLUCOSE 130*  BUN 9  CREATININE 0.71  CALCIUM  8.1*   Recent Labs    03/10/24 0751  AST 20  ALT 8  ALKPHOS 73  BILITOT 0.8  PROT 5.3*  ALBUMIN 3.3*   Recent Labs    03/11/24 0150 03/12/24 0541  WBC 6.7 7.2  HGB 8.0* 7.8*  HCT 25.0* 24.9*  MCV 94.3 96.1  PLT 231 212      Assessment/Plan: Obscure overt bleeding - no signs of ongoing bleeding. Anticoagulation on hold. Colonoscopy 1/5. Start clears this afternoon. Colon prep tomorrow.    [name] 03/12/2024, 12:57 PM  Questions please call [phone] ID: Desiree Leon Blush, female   DOB: 30-Dec-1940, 84 y.o.   MRN: [id number]

## 2024-03-12 NOTE — Progress Notes (Signed)
 [facility] Gastroenterology Progress Note  Desiree Leon 84 y.o. 08-05-1940   Subjective: Lower abdominal pain for 2 years. Family at bedside.   Objective: Vital signs: Vitals:   03/12/24 0856 03/12/24 1201  BP:  (!) 132/55  Pulse:  (!) 51  Resp:    Temp:  98.8 F (37.1 C)  SpO2: 99% 100%    Physical Exam: Gen: lethargic, elderly, obese, no acute distress, pleasant HEENT: anicteric sclera CV: RRR Chest: CTA B Abd: RLQ and LLQ tenderness (greatest in LLQ) with guarding, soft, nondistended, +BS Ext: no edema  Lab Results: Recent Labs    03/10/24 0751  NA 140  K 3.9  CL 106  CO2 26  GLUCOSE 130*  BUN 9  CREATININE 0.71  CALCIUM  8.1*   Recent Labs    03/10/24 0751  AST 20  ALT 8  ALKPHOS 73  BILITOT 0.8  PROT 5.3*  ALBUMIN 3.3*   Recent Labs    03/11/24 0150 03/12/24 0541  WBC 6.7 7.2  HGB 8.0* 7.8*  HCT 25.0* 24.9*  MCV 94.3 96.1  PLT 231 212      Assessment/Plan: Obscure overt bleeding - no signs of ongoing bleeding. Anticoagulation on hold. Colonoscopy 1/5. Start clears this afternoon. Colon prep tomorrow.    [name] 03/12/2024, 12:57 PM  Questions please call [phone] ID: Desiree Leon, female   DOB: 30-Dec-1940, 84 y.o.   MRN: [id number]

## 2024-03-12 NOTE — Plan of Care (Signed)

## 2024-03-12 NOTE — Progress Notes (Signed)
 "  PROGRESS NOTE    Desiree Leon  FMW:[id number] DOB: [birth year] DOA: 03/09/2024 PCP: Shepard Ade, MD   Brief Narrative: Desiree Leon is a 84 y.o. female with a history of hypertension, hyperlipidemia, stroke, diabetes mellitus type 2, hypothyroidism, CKD stage IIIa, CAD, carotid artery stenosis, PAD, mesenteric stenosis s/p stent, heart failure, GI bleeding, anxiety, depression, COPD, chronic respiratory failure on oxygen .  Patient presented secondary to GI bleeding with associated anemia. GI consulted and capsule endoscopy performed on 03/10/24.   Assessment and Plan:  GI bleed Patient with a history of prior GI bleed with workup significant for diverticulosis, polyps and ectatic lesions, now presenting with dark and bright red stools. Complicated by aspirin , Plavix  and Eliquis  use. Gastroenterology consulted on admission. Capsule endoscopy performed on 1/1 without identification of bleeding source. - GI recommendations: colonoscopy planned for 1/5; goal hgb >7  Chronic normocytic anemia Acute blood loss anemia Baseline hemoglobin around 10 g/dL. Hemoglobin of 7.4 g/dL on admission with improvement to 7.9 g/dL after 1 unit of PRBC via transfusion. Hemoglobin down to 7.8 g/dL with patient reporting dyspnea with exertion, worse than baseline. - Transfuse 1 unit of PRBC  Chronic respiratory failure with hypoxia Patient is managed on 3 L/min of supplemental oxygen  at baseline. Stable. - Continue oxygen   Primary hypertension - Continue metoprolol  tartrate  Hyperlipidemia - Continue Zetia   Diabetes mellitus type 2 Well controlled based on hemoglobin A1C of 6.4% from January 2025.  Possible aspiration Patient with cough after eating and history of choking on her saliva. She reports never having been evaluated by speech therapy. Speech therapy consulted and recommend ENT follow-up as an outpatient.  Hypothyroidism - Continue  Synthroid  137 mcg daily  CAD Currently no chest  pain. Last heart catheterization from April 2025. Patient is managed on aspirin , Crestor , Zetia , Ranexa , metoprolol , but is also on Plavix  from per vascular surgery placement of a mesenteric artery stent. - Continue Ranexa   PAD Mesenteric artery stenosis Patient follows with vascular surgery as an outpatient with recent stent placement on 12/9. Patient was placed on Aspirin , Plavix  and maintained on Eliquis  with plan to continue triple therapy until 03/18/24. Plavix  and Eliquis  held secondary to GI bleeding. Vascular surgery contacted this admission and recommended to continue Aspirin . - Continue Aspirin   Chronic HFpEF Stable.  Carotid artery disease - Continue Crestor  and Zetia   Depression Anxiety - Continue Lexapro   History of PE Patient is managed on Eliquis  as an outpatient which is held secondary to GI bleeding.   DVT prophylaxis: SCDs Code Status:   Code Status: Full Code Family Communication: Daughter at bedside Disposition Plan: Discharge pending ongoing GI recommendations/management, stable hemoglobin and resolution of GI bleeding   Consultants:  [facility]  Procedures:  None  Antimicrobials: None    Subjective: Bowel movements overnight that appeared normal without melena or hematochezia. Patient reports dyspnea overnight and cough, without change or increase in sputum. She reports dyspnea on exertion.  Objective: BP (!) 131/50   Pulse (!) 54   Temp 98.1 F (36.7 C) (Oral)   Resp 17   Ht 5' 3 (1.6 m)   Wt 85.3 kg   SpO2 100%   BMI 33.31 kg/m   Examination:  General exam: Appears calm and comfortable. Respiratory system: Diminished but mild expiratory wheeze Cardiovascular system: S1 & S2 heard, RRR. Gastrointestinal system: Abdomen is distended, soft and nontender. Normal bowel sounds heard. Central nervous system: Alert and oriented. No focal neurological deficits. Psychiatry: Judgement and insight appear normal.  Mood & affect  appropriate.    Data Reviewed: I have personally reviewed following labs and imaging studies  CBC Lab Results  Component Value Date   WBC 7.2 03/12/2024   RBC 2.59 (L) 03/12/2024   HGB 7.8 (L) 03/12/2024   HCT 24.9 (L) 03/12/2024   MCV 96.1 03/12/2024   MCH 30.1 03/12/2024   PLT 212 03/12/2024   MCHC 31.3 03/12/2024   RDW 15.2 03/12/2024   LYMPHSABS 2.7 12/10/2023   MONOABS 0.7 12/10/2023   EOSABS 0.1 12/10/2023   BASOSABS 0.0 12/10/2023     Last metabolic panel Lab Results  Component Value Date   NA 140 03/10/2024   K 3.9 03/10/2024   CL 106 03/10/2024   CO2 26 03/10/2024   BUN 9 03/10/2024   CREATININE 0.71 03/10/2024   GLUCOSE 130 (H) 03/10/2024   GFRNONAA >60 03/10/2024   GFRAA >60 04/12/2019   CALCIUM  8.1 (L) 03/10/2024   PROT 5.3 (L) 03/10/2024   ALBUMIN 3.3 (L) 03/10/2024   BILITOT 0.8 03/10/2024   ALKPHOS 73 03/10/2024   AST 20 03/10/2024   ALT 8 03/10/2024   ANIONGAP 8 03/10/2024    GFR: Estimated Creatinine Clearance: 54.2 mL/min (by C-G formula based on SCr of 0.71 mg/dL).  No results found for this or any previous visit (from the past 240 hours).    Radiology Studies: DG CHEST PORT 1 VIEW Result Date: 03/12/2024 EXAM: 1 VIEW(S) XRAY OF THE CHEST 03/12/2024 01:00:00 AM COMPARISON: 12/10/2023. CLINICAL HISTORY: SOB (shortness of breath). FINDINGS: LUNGS AND PLEURA: No focal pulmonary opacity. No pleural effusion. No pneumothorax. HEART AND MEDIASTINUM: Aortic atherosclerosis. No acute abnormality of the cardiac and mediastinal silhouettes. BONES AND SOFT TISSUES: No acute osseous abnormality. IMPRESSION: 1. No acute process. Electronically signed by: Franky Crease MD 03/12/2024 01:05 AM EST RP Workstation: [id number]      LOS: 0 days    Elgin Lam, MD [facility] 03/12/2024, 3:37 PM   If 7PM-7AM, please contact night-coverage [email or web address]  "

## 2024-03-13 ENCOUNTER — Observation Stay (HOSPITAL_COMMUNITY)

## 2024-03-13 DIAGNOSIS — K922 Gastrointestinal hemorrhage, unspecified: Secondary | ICD-10-CM | POA: Diagnosis not present

## 2024-03-13 DIAGNOSIS — R0609 Other forms of dyspnea: Secondary | ICD-10-CM

## 2024-03-13 LAB — CBC
HCT: 27.5 % — ABNORMAL LOW (ref 36.0–46.0)
Hemoglobin: 8.8 g/dL — ABNORMAL LOW (ref 12.0–15.0)
MCH: 30.4 pg (ref 26.0–34.0)
MCHC: 32 g/dL (ref 30.0–36.0)
MCV: 95.2 fL (ref 80.0–100.0)
Platelets: 198 K/uL (ref 150–400)
RBC: 2.89 MIL/uL — ABNORMAL LOW (ref 3.87–5.11)
RDW: 15.5 % (ref 11.5–15.5)
WBC: 6.5 K/uL (ref 4.0–10.5)
nRBC: 0 % (ref 0.0–0.2)

## 2024-03-13 LAB — BPAM RBC
Blood Product Expiration Date: 202601312359
ISSUE DATE / TIME: 202601031436
Unit Type and Rh: 9500

## 2024-03-13 LAB — TYPE AND SCREEN
ABO/RH(D): O NEG
Antibody Screen: NEGATIVE
Unit division: 0

## 2024-03-13 LAB — GLUCOSE, CAPILLARY
Glucose-Capillary: 133 mg/dL — ABNORMAL HIGH (ref 70–99)
Glucose-Capillary: 220 mg/dL — ABNORMAL HIGH (ref 70–99)
Glucose-Capillary: 115 mg/dL — ABNORMAL HIGH (ref 70–99)
Glucose-Capillary: 124 mg/dL — ABNORMAL HIGH (ref 70–99)

## 2024-03-13 MED ORDER — FUROSEMIDE 10 MG/ML IJ SOLN
40.0000 mg | Freq: Once | INTRAMUSCULAR | Status: AC
  Administered 2024-03-13: 40 mg via INTRAVENOUS
  Filled 2024-03-13: qty 4

## 2024-03-13 MED ORDER — STERILE WATER FOR INJECTION IJ SOLN
INTRAMUSCULAR | Status: AC
  Administered 2024-03-13: 10 mL
  Filled 2024-03-13: qty 10

## 2024-03-13 NOTE — Progress Notes (Signed)
 "  PROGRESS NOTE    Desiree Leon  FMW:[id number] DOB: 11-19-1940 DOA: 03/09/2024 PCP: Shepard Ade, MD   Brief Narrative: Desiree Leon is a 84 y.o. female with a history of hypertension, hyperlipidemia, stroke, diabetes mellitus type 2, hypothyroidism, CKD stage IIIa, CAD, carotid artery stenosis, PAD, mesenteric stenosis s/p stent, heart failure, GI bleeding, anxiety, depression, COPD, chronic respiratory failure on oxygen .  Patient presented secondary to GI bleeding with associated anemia. GI consulted and capsule endoscopy performed on 03/10/24.   Assessment and Plan:  GI bleed Patient with a history of prior GI bleed with workup significant for diverticulosis, polyps and ectatic lesions, now presenting with dark and bright red stools. Complicated by aspirin , Plavix  and Eliquis  use. Gastroenterology consulted on admission. Capsule endoscopy performed on 1/1 without identification of bleeding source. - GI recommendations: colonoscopy planned for 1/5; goal hgb >7  Chronic normocytic anemia Acute blood loss anemia Baseline hemoglobin around 10 g/dL. Hemoglobin of 7.4 g/dL on admission with improvement to 7.9 g/dL after 1 unit of PRBC via transfusion. Hemoglobin down to 7.8 g/dL with patient reporting dyspnea with exertion, worse than baseline; patient transfused a second unit of PRBC on 1/4 with post-transfusion hemoglobin of 8.8 g/dL.  Chronic respiratory failure with hypoxia Patient is managed on 3 L/min of supplemental oxygen  at baseline. Stable. - Continue oxygen   Dyspnea on exertion Unclear etiology. Patient without wheezing to suggest COPD exacerbation. Rales on exam suggests possible fluid overload or atelectasis. - Check x-ray - Lasix  IV x1 and assess response - Ambulatory pulse ox  Primary hypertension - Continue metoprolol  tartrate  Hyperlipidemia - Continue Zetia   Diabetes mellitus type 2 Well controlled based on hemoglobin A1C of 6.4% from January  2025.  Possible aspiration Patient with cough after eating and history of choking on her saliva. She reports never having been evaluated by speech therapy. Speech therapy consulted and recommend ENT follow-up as an outpatient.  Hypothyroidism - Continue  Synthroid  137 mcg daily  CAD Currently no chest pain. Last heart catheterization from April 2025. Patient is managed on aspirin , Crestor , Zetia , Ranexa , metoprolol , but is also on Plavix  from per vascular surgery placement of a mesenteric artery stent. - Continue Ranexa   PAD Mesenteric artery stenosis Patient follows with vascular surgery as an outpatient with recent stent placement on 12/9. Patient was placed on Aspirin , Plavix  and maintained on Eliquis  with plan to continue triple therapy until 03/18/24. Plavix  and Eliquis  held secondary to GI bleeding. Vascular surgery contacted this admission and recommended to continue Aspirin . - Continue Aspirin   Chronic HFpEF Stable.  Carotid artery disease - Continue Crestor  and Zetia   Depression Anxiety - Continue Lexapro   History of PE Patient is managed on Eliquis  as an outpatient which is held secondary to GI bleeding.   DVT prophylaxis: SCDs Code Status:   Code Status: Full Code Family Communication: Daughter at bedside Disposition Plan: Discharge pending ongoing GI recommendations/management, stable hemoglobin and resolution of GI bleeding   Consultants:  [facility]  Procedures:  None  Antimicrobials: None    Subjective: Some dyspnea with exertion. Also with ongoing cough. Afebrile. No feeling of malaise. Patient reports no production.  Objective: BP (!) 136/48 (BP Location: Left Arm)   Pulse (!) 54   Temp 98.2 F (36.8 C) (Oral)   Resp 19   Ht 5' 3 (1.6 m)   Wt 85.3 kg   SpO2 100%   BMI 33.31 kg/m   Examination:  General exam: Appears calm and comfortable. Respiratory system: Diminished  with bibasilar rales Cardiovascular system: S1 & S2  heard, RRR. Gastrointestinal system: Abdomen is distended, soft and nontender. Normal bowel sounds heard. Central nervous system: Alert and oriented. No focal neurological deficits. Psychiatry: Judgement and insight appear normal. Mood & affect appropriate.    Data Reviewed: I have personally reviewed following labs and imaging studies  CBC Lab Results  Component Value Date   WBC 6.5 03/13/2024   RBC 2.89 (L) 03/13/2024   HGB 8.8 (L) 03/13/2024   HCT 27.5 (L) 03/13/2024   MCV 95.2 03/13/2024   MCH 30.4 03/13/2024   PLT 198 03/13/2024   MCHC 32.0 03/13/2024   RDW 15.5 03/13/2024   LYMPHSABS 2.7 12/10/2023   MONOABS 0.7 12/10/2023   EOSABS 0.1 12/10/2023   BASOSABS 0.0 12/10/2023     Last metabolic panel Lab Results  Component Value Date   NA 140 03/10/2024   K 3.9 03/10/2024   CL 106 03/10/2024   CO2 26 03/10/2024   BUN 9 03/10/2024   CREATININE 0.71 03/10/2024   GLUCOSE 130 (H) 03/10/2024   GFRNONAA >60 03/10/2024   GFRAA >60 04/12/2019   CALCIUM  8.1 (L) 03/10/2024   PROT 5.3 (L) 03/10/2024   ALBUMIN 3.3 (L) 03/10/2024   BILITOT 0.8 03/10/2024   ALKPHOS 73 03/10/2024   AST 20 03/10/2024   ALT 8 03/10/2024   ANIONGAP 8 03/10/2024    GFR: Estimated Creatinine Clearance: 54.2 mL/min (by C-G formula based on SCr of 0.71 mg/dL).  No results found for this or any previous visit (from the past 240 hours).    Radiology Studies: DG CHEST PORT 1 VIEW Result Date: 03/13/2024 EXAM: 1 VIEW(S) XRAY OF THE CHEST 03/13/2024 11:06:00 AM COMPARISON: 03/12/2024 CLINICAL HISTORY: Bilateral rales FINDINGS: LUNGS AND PLEURA: Left basilar linear opacities, which may represent atelectasis or scarring. Trace left pleural effusion. No pneumothorax. HEART AND MEDIASTINUM: Cardiomegaly. Atherosclerotic calcifications. BONES AND SOFT TISSUES: No acute osseous abnormality. IMPRESSION: 1. Left basilar linear atelectasis vs scar. 2. Trace left pleural effusion. 3. Cardiomegaly. Electronically  signed by: Waddell Calk MD 03/13/2024 11:51 AM EST RP Workstation: [id number]   DG CHEST PORT 1 VIEW Result Date: 03/12/2024 EXAM: 1 VIEW(S) XRAY OF THE CHEST 03/12/2024 01:00:00 AM COMPARISON: 12/10/2023. CLINICAL HISTORY: SOB (shortness of breath). FINDINGS: LUNGS AND PLEURA: No focal pulmonary opacity. No pleural effusion. No pneumothorax. HEART AND MEDIASTINUM: Aortic atherosclerosis. No acute abnormality of the cardiac and mediastinal silhouettes. BONES AND SOFT TISSUES: No acute osseous abnormality. IMPRESSION: 1. No acute process. Electronically signed by: Franky Crease MD 03/12/2024 01:05 AM EST RP Workstation: [id number]      LOS: 0 days    Elgin Lam, MD [facility] 03/13/2024, 12:33 PM   If 7PM-7AM, please contact night-coverage [email or web address]  "

## 2024-03-13 NOTE — Progress Notes (Signed)
.. ° ° °  PROCEDURAL EXPEDITER PROGRESS NOTE  Patient Name: Desiree Leon  DOB:[birth year] Date of Admission: 03/09/2024  Date of Assessment:03/13/2024   -------------------------------------------------------------------------------------------------------------------   Brief clinical summary: Pt to have an colonoscopy done tomorrow  Orders in place:  Yes   Labs, test, and orders reviewed: Y  Requires surgical clearance:  No  Barriers noted: N/A   -------------------------------------------------------------------------------------------------------------------  [facility], [place], [place] Please contact us  directly via secure chat (search for [facility]) or by calling us  at [phone] [facility]).

## 2024-03-13 NOTE — Plan of Care (Signed)

## 2024-03-14 ENCOUNTER — Observation Stay (HOSPITAL_COMMUNITY): Admitting: Anesthesiology

## 2024-03-14 ENCOUNTER — Encounter (HOSPITAL_COMMUNITY): Payer: Self-pay | Admitting: Internal Medicine

## 2024-03-14 ENCOUNTER — Encounter (HOSPITAL_COMMUNITY): Admission: EM | Disposition: A | Payer: Self-pay | Source: Home / Self Care | Attending: Family Medicine

## 2024-03-14 DIAGNOSIS — K5731 Diverticulosis of large intestine without perforation or abscess with bleeding: Secondary | ICD-10-CM | POA: Diagnosis present

## 2024-03-14 DIAGNOSIS — Z87891 Personal history of nicotine dependence: Secondary | ICD-10-CM

## 2024-03-14 DIAGNOSIS — J441 Chronic obstructive pulmonary disease with (acute) exacerbation: Secondary | ICD-10-CM | POA: Diagnosis not present

## 2024-03-14 DIAGNOSIS — Z7902 Long term (current) use of antithrombotics/antiplatelets: Secondary | ICD-10-CM | POA: Diagnosis not present

## 2024-03-14 DIAGNOSIS — I10 Essential (primary) hypertension: Secondary | ICD-10-CM | POA: Diagnosis not present

## 2024-03-14 DIAGNOSIS — J40 Bronchitis, not specified as acute or chronic: Secondary | ICD-10-CM

## 2024-03-14 DIAGNOSIS — N1831 Chronic kidney disease, stage 3a: Secondary | ICD-10-CM | POA: Diagnosis present

## 2024-03-14 DIAGNOSIS — K2981 Duodenitis with bleeding: Secondary | ICD-10-CM | POA: Diagnosis present

## 2024-03-14 DIAGNOSIS — E669 Obesity, unspecified: Secondary | ICD-10-CM | POA: Diagnosis present

## 2024-03-14 DIAGNOSIS — K5730 Diverticulosis of large intestine without perforation or abscess without bleeding: Secondary | ICD-10-CM | POA: Diagnosis present

## 2024-03-14 DIAGNOSIS — I5042 Chronic combined systolic (congestive) and diastolic (congestive) heart failure: Secondary | ICD-10-CM | POA: Diagnosis present

## 2024-03-14 DIAGNOSIS — J9811 Atelectasis: Secondary | ICD-10-CM | POA: Diagnosis not present

## 2024-03-14 DIAGNOSIS — F32A Depression, unspecified: Secondary | ICD-10-CM | POA: Diagnosis present

## 2024-03-14 DIAGNOSIS — I25110 Atherosclerotic heart disease of native coronary artery with unstable angina pectoris: Secondary | ICD-10-CM | POA: Diagnosis not present

## 2024-03-14 DIAGNOSIS — Z7901 Long term (current) use of anticoagulants: Secondary | ICD-10-CM | POA: Diagnosis not present

## 2024-03-14 DIAGNOSIS — K5520 Angiodysplasia of colon without hemorrhage: Secondary | ICD-10-CM

## 2024-03-14 DIAGNOSIS — E1159 Type 2 diabetes mellitus with other circulatory complications: Secondary | ICD-10-CM | POA: Diagnosis present

## 2024-03-14 DIAGNOSIS — J9611 Chronic respiratory failure with hypoxia: Secondary | ICD-10-CM | POA: Diagnosis present

## 2024-03-14 DIAGNOSIS — D62 Acute posthemorrhagic anemia: Secondary | ICD-10-CM | POA: Diagnosis present

## 2024-03-14 DIAGNOSIS — E039 Hypothyroidism, unspecified: Secondary | ICD-10-CM | POA: Diagnosis present

## 2024-03-14 DIAGNOSIS — K5521 Angiodysplasia of colon with hemorrhage: Secondary | ICD-10-CM | POA: Diagnosis present

## 2024-03-14 DIAGNOSIS — I130 Hypertensive heart and chronic kidney disease with heart failure and stage 1 through stage 4 chronic kidney disease, or unspecified chronic kidney disease: Secondary | ICD-10-CM | POA: Diagnosis present

## 2024-03-14 DIAGNOSIS — K551 Chronic vascular disorders of intestine: Secondary | ICD-10-CM | POA: Diagnosis present

## 2024-03-14 DIAGNOSIS — K922 Gastrointestinal hemorrhage, unspecified: Secondary | ICD-10-CM | POA: Diagnosis not present

## 2024-03-14 DIAGNOSIS — E1122 Type 2 diabetes mellitus with diabetic chronic kidney disease: Secondary | ICD-10-CM | POA: Diagnosis present

## 2024-03-14 HISTORY — PX: HOT HEMOSTASIS: SHX5433

## 2024-03-14 HISTORY — PX: COLONOSCOPY: SHX5424

## 2024-03-14 HISTORY — PX: HEMOSTASIS CLIP PLACEMENT: SHX6857

## 2024-03-14 HISTORY — PX: BIOPSY OF SKIN SUBCUTANEOUS TISSUE AND/OR MUCOUS MEMBRANE: SHX6741

## 2024-03-14 LAB — BASIC METABOLIC PANEL WITH GFR
Anion gap: 9 (ref 5–15)
BUN: <5 mg/dL — ABNORMAL LOW (ref 8–23)
CO2: 26 mmol/L (ref 22–32)
Calcium: 8.5 mg/dL — ABNORMAL LOW (ref 8.9–10.3)
Chloride: 104 mmol/L (ref 98–111)
Creatinine, Ser: 0.66 mg/dL (ref 0.44–1.00)
GFR, Estimated: >60 mL/min
Glucose, Bld: 151 mg/dL — ABNORMAL HIGH (ref 70–99)
Potassium: 3.4 mmol/L — ABNORMAL LOW (ref 3.5–5.1)
Sodium: 139 mmol/L (ref 135–145)

## 2024-03-14 LAB — GLUCOSE, CAPILLARY
Glucose-Capillary: 126 mg/dL — ABNORMAL HIGH (ref 70–99)
Glucose-Capillary: 121 mg/dL — ABNORMAL HIGH (ref 70–99)
Glucose-Capillary: 178 mg/dL — ABNORMAL HIGH (ref 70–99)
Glucose-Capillary: 151 mg/dL — ABNORMAL HIGH (ref 70–99)

## 2024-03-14 MED ORDER — PROPOFOL 10 MG/ML IV BOLUS
INTRAVENOUS | Status: DC | PRN
  Administered 2024-03-14: 20 mg via INTRAVENOUS
  Administered 2024-03-14: 10 mg via INTRAVENOUS
  Administered 2024-03-14: 20 mg via INTRAVENOUS
  Administered 2024-03-14: 10 mg via INTRAVENOUS

## 2024-03-14 MED ORDER — IPRATROPIUM-ALBUTEROL 0.5-2.5 (3) MG/3ML IN SOLN: 3.0000 mL | RESPIRATORY_TRACT | Status: DC | PRN

## 2024-03-14 MED ORDER — SODIUM CHLORIDE 0.9 % IV SOLN: INTRAVENOUS | Status: DC

## 2024-03-14 MED ORDER — FUROSEMIDE 10 MG/ML IJ SOLN
40.0000 mg | Freq: Once | INTRAMUSCULAR | Status: AC
  Administered 2024-03-14: 40 mg via INTRAVENOUS
  Filled 2024-03-14: qty 4

## 2024-03-14 MED ORDER — PROPOFOL 500 MG/50ML IV EMUL
INTRAVENOUS | Status: DC | PRN
  Administered 2024-03-14: 115 ug/kg/min via INTRAVENOUS
  Administered 2024-03-14: 30 mg via INTRAVENOUS

## 2024-03-14 MED ORDER — POTASSIUM CHLORIDE CRYS ER 20 MEQ PO TBCR
40.0000 meq | EXTENDED_RELEASE_TABLET | Freq: Once | ORAL | Status: AC
  Administered 2024-03-14: 40 meq via ORAL
  Filled 2024-03-14: qty 2

## 2024-03-14 MED ORDER — DOXYCYCLINE HYCLATE 100 MG PO TABS
100.0000 mg | ORAL_TABLET | Freq: Two times a day (BID) | ORAL | Status: DC
  Administered 2024-03-14 – 2024-03-15 (×2): 100 mg via ORAL
  Filled 2024-03-14 (×3): qty 1

## 2024-03-14 MED ORDER — LIDOCAINE 2% (20 MG/ML) 5 ML SYRINGE
INTRAMUSCULAR | Status: DC | PRN
  Administered 2024-03-14: 80 mg via INTRAVENOUS

## 2024-03-14 MED ORDER — NITROGLYCERIN 0.4 MG/HR TD PT24
0.4000 mg | MEDICATED_PATCH | TRANSDERMAL | Status: DC
  Administered 2024-03-15: 0.4 mg via TRANSDERMAL
  Filled 2024-03-14: qty 1

## 2024-03-14 MED ORDER — PANTOPRAZOLE SODIUM 40 MG PO TBEC
40.0000 mg | DELAYED_RELEASE_TABLET | Freq: Two times a day (BID) | ORAL | Status: DC
  Administered 2024-03-14 – 2024-03-15 (×2): 40 mg via ORAL
  Filled 2024-03-14 (×2): qty 1

## 2024-03-14 NOTE — Interval H&P Note (Signed)
 History and Physical Interval Note:  03/14/2024 10:37 AM  Desiree Leon  has presented today for surgery, with the diagnosis of Anemia.  The various methods of treatment have been discussed with the patient and family. After consideration of risks, benefits and other options for treatment, the patient has consented to  Procedures: COLONOSCOPY (N/A) as a surgical intervention.  The patient's history has been reviewed, patient examined, no change in status, stable for surgery.  I have reviewed the patient's chart and labs.  Questions were answered to the patient's satisfaction.     [name]

## 2024-03-14 NOTE — Plan of Care (Signed)

## 2024-03-14 NOTE — Anesthesia Preprocedure Evaluation (Addendum)
 "                                  Anesthesia Evaluation  Patient identified by MRN, date of birth, ID band Patient awake    Reviewed: Allergy & Precautions, NPO status , Patient's Chart, lab work & pertinent test results, reviewed documented beta blocker date and time   History of Anesthesia Complications Negative for: history of anesthetic complications  Airway Mallampati: III  TM Distance: >3 FB Neck ROM: Full    Dental  (+) Dental Advisory Given, Teeth Intact, Caps, Missing   Pulmonary shortness of breath and with exertion, pneumonia, resolved, COPD,  COPD inhaler and oxygen  dependent, neg recent URI, former smoker, PE Acute pulmonary embolism (HCC) 05/21/2021   Pulmonary exam normal breath sounds clear to auscultation       Cardiovascular hypertension, Pt. on medications + angina with exertion + CAD, + Past MI and + Peripheral Vascular Disease  (-) CHF Normal cardiovascular exam+ Valvular Problems/Murmurs  Rhythm:Regular Rate:Normal  EKG 12/11/23 NSR, low voltage  Echo 04/22/22 1. Left ventricular ejection fraction, by estimation, is 65 to 70%. The  left ventricle has normal function. The left ventricle has no regional  wall motion abnormalities. Left ventricular diastolic parameters are  consistent with Grade II diastolic  dysfunction (pseudonormalization). Elevated left atrial pressure.   2. Right ventricular systolic function is normal. The right ventricular  size is normal. There is normal pulmonary artery systolic pressure.   3. Left atrial size was moderately dilated.   4. Right atrial size was mildly dilated.   5. The mitral valve is grossly normal. Mild to moderate mitral valve  regurgitation.   6. The aortic valve is grossly normal. There is mild calcification of the  aortic valve. Aortic valve regurgitation is not visualized.   7. Aortic dilatation noted. Aneurysm of the ascending aorta, measuring 40  mm.   8. The inferior vena cava is dilated in  size with <50% respiratory  variability, suggesting right atrial pressure of 15 mmHg.   Stent SMA   Neuro/Psych  PSYCHIATRIC DISORDERS Anxiety Depression    Tremors CVA, Residual Symptoms    GI/Hepatic Neg liver ROS,GERD  Medicated,,  Endo/Other  diabetes, Well Controlled, Type 2Hypothyroidism  Obesity HLD  Renal/GU Renal InsufficiencyRenal diseaseLab Results      Component                Value               Date                      NA                       140                 03/10/2024                CL                       106                 03/10/2024                K                        3.9  03/10/2024                CO2                      26                  03/10/2024                BUN                      9                   03/10/2024                CREATININE               0.71                03/10/2024                GFRNONAA                 >60                 03/10/2024                CALCIUM                   8.1 (L)             03/10/2024                ALBUMIN                  3.3 (L)             03/10/2024                GLUCOSE                  130 (H)             03/10/2024              negative genitourinary   Musculoskeletal  (+) Arthritis , Osteoarthritis,    Abdominal  (+) + obese  Peds  Hematology  (+) Blood dyscrasia, anemia Lab Results      Component                Value               Date                      WBC                      6.5                 03/13/2024                HGB                      8.8 (L)             03/13/2024                HCT                      27.5 (L)            03/13/2024  MCV                      95.2                03/13/2024                PLT                      198                 03/13/2024                 Plavix  therapy - last dose 12/30 Eliquis  therapy- last dose 12/31   Anesthesia Other Findings   Reproductive/Obstetrics                               Anesthesia Physical Anesthesia Plan  ASA: 3  Anesthesia Plan: MAC   Post-op Pain Management: Minimal or no pain anticipated   Induction: Intravenous  PONV Risk Score and Plan: 2 and Propofol  infusion and Treatment may vary due to age or medical condition  Airway Management Planned: Natural Airway, Simple Face Mask and Nasal Cannula  Additional Equipment: None  Intra-op Plan:   Post-operative Plan:   Informed Consent: I have reviewed the patients History and Physical, chart, labs and discussed the procedure including the risks, benefits and alternatives for the proposed anesthesia with the patient or authorized representative who has indicated his/her understanding and acceptance.     Dental advisory given  Plan Discussed with: CRNA and Anesthesiologist  Anesthesia Plan Comments: (Pt drinking ice water  until ~800am)         Anesthesia Quick Evaluation  "

## 2024-03-14 NOTE — Transfer of Care (Signed)
 Immediate Anesthesia Transfer of Care Note  Patient: Desiree Leon  Procedure(s) Performed: COLONOSCOPY COLONOSCOPY, WITH ARGON PLASMA COAGULATION CONTROL OF HEMORRHAGE, GI TRACT, ENDOSCOPIC, BY HEMOSTATIC POWDER APPLICATION BIOPSY, SKIN, SUBCUTANEOUS TISSUE, OR MUCOUS MEMBRANE  Patient Location: PACU and Endoscopy Unit  Anesthesia Type:MAC  Level of Consciousness: awake, alert , and oriented  Airway & Oxygen  Therapy: Patient Spontanous Breathing and Patient connected to nasal cannula oxygen   Post-op Assessment: Report given to RN and Post -op Vital signs reviewed and stable  Post vital signs: Reviewed and stable  Last Vitals:  Vitals Value Taken Time  BP 125/55 03/14/24 11:27  Temp    Pulse 60 03/14/24 11:30  Resp 14 03/14/24 11:30  SpO2 95 % 03/14/24 11:30  Vitals shown include unfiled device data.  Last Pain:  Vitals:   03/14/24 0946  TempSrc: Oral  PainSc: 0-No pain      Patients Stated Pain Goal: 0 (03/13/24 2250)  Complications: No notable events documented.

## 2024-03-14 NOTE — Op Note (Signed)
 [facility] Patient Name: Desiree Leon Procedure Date : 03/14/2024 MRN: [id number] Attending MD: [name] , MD, [id number] Date of Birth: 10-20-1940 CSN: [id number] Age: 84 Admit Type: Inpatient Procedure:                Colonoscopy Indications:              Last colonoscopy: February 2024, Hematochezia,                            Melena Providers:                Jerrell KYM Sol, MD, Ozell Pouch, Curtistine Bishop, Technician Referring MD:             hospital team Medicines:                Propofol  per Anesthesia, Monitored Anesthesia Care Complications:            No immediate complications. Estimated Blood Loss:     Estimated blood loss was minimal. Procedure:                Pre-Anesthesia Assessment:                           - Prior to the procedure, a History and Physical                            was performed, and patient medications and                            allergies were reviewed. The patient's tolerance of                            previous anesthesia was also reviewed. The risks                            and benefits of the procedure and the sedation                            options and risks were discussed with the patient.                            All questions were answered, and informed consent                            was obtained. Prior Anticoagulants: The patient has                            taken Eliquis  (apixaban ), last dose was stopped at                            admission. ASA Grade Assessment: III - A patient  with severe systemic disease. After reviewing the                            risks and benefits, the patient was deemed in                            satisfactory condition to undergo the procedure.                           - Prior to the procedure, a History and Physical                            was performed, and patient medications and                             allergies were reviewed. The patient's tolerance of                            previous anesthesia was also reviewed. The risks                            and benefits of the procedure and the sedation                            options and risks were discussed with the patient.                            All questions were answered, and informed consent                            was obtained. Prior Anticoagulants: The patient has                            taken Plavix  (clopidogrel ), last dose was stopped                            at admission. ASA Grade Assessment: III - A patient                            with severe systemic disease. After reviewing the                            risks and benefits, the patient was deemed in                            satisfactory condition to undergo the procedure.                           After obtaining informed consent, the colonoscope                            was passed under direct vision. Throughout the  procedure, the patient's blood pressure, pulse, and                            oxygen  saturations were monitored continuously. The                            [id number]-HQ190L ([id number]) Olympus colonoscope was                            introduced through the anus and advanced to the the                            cecum, identified by appendiceal orifice and                            ileocecal valve. The colonoscopy was performed with                            difficulty due to excessive bleeding, significant                            looping and a tortuous colon. Successful completion                            of the procedure was aided by straightening and                            shortening the scope to obtain bowel loop                            reduction, using scope torsion, applying abdominal                            pressure, controlling the bleeding and performing                             the maneuvers documented (below) in this report.                            The patient tolerated the procedure fairly well.                            The quality of the bowel preparation was fair. The                            ileocecal valve, appendiceal orifice, and rectum                            were photographed. Scope In: 10:50:36 AM Scope Out: 11:20:50 AM Scope Withdrawal Time: 0 hours 27 minutes 13 seconds  Total Procedure Duration: 0 hours 30 minutes 14 seconds  Findings:      The perianal and digital rectal examinations were normal.      Multiple medium-sized patchy angiodysplastic lesions without bleeding  were found in the proximal ascending colon and in the cecum. Fulguration       to ablate the lesion to prevent bleeding by argon plasma was       unsuccessful. To stop active bleeding, hemostatic spray was deployed. A       single spray was applied. There was no bleeding at the end of the       procedure.      A segmental area of moderately congested and thickened folds of the       mucosa was found in the sigmoid colon. Biopsies were taken with a cold       forceps for histology. Estimated blood loss was minimal.      Internal hemorrhoids were found during retroflexion. The hemorrhoids       were medium-sized and Grade I (internal hemorrhoids that do not       prolapse).      A foreign body (capsule) was found in the cecum.      Scattered medium-mouthed and small-mouthed diverticula were found in the       sigmoid colon. Impression:               - Preparation of the colon was fair.                           - Multiple non-bleeding colonic angiodysplastic                            lesions. Treatment not successful. Treated with                            argon plasma coagulation (APC). hemostatic spray                            applied.                           - Congested and thickened folds of the mucosa in                            the sigmoid colon.  Biopsied.                           - Internal hemorrhoids.                           - Foreign body in the cecum.                           - Diverticulosis in the sigmoid colon. Recommendation:           - Advance diet as tolerated and full liquid diet.                           - Await pathology results.                           - Repeat colonoscopy for surveillance based on  pathology results. Procedure Code(s):        --- Professional ---                           [phone], 59, Colonoscopy, flexible; with control of                            bleeding, any method                           45380, Colonoscopy, flexible; with biopsy, single                            or multiple Diagnosis Code(s):        --- Professional ---                           K92.1, Melena (includes Hematochezia)                           K55.20, Angiodysplasia of colon without hemorrhage                           K64.0, First degree hemorrhoids                           K63.89, Other specified diseases of intestine                           T18.4XXA, Foreign body in colon, initial encounter                           K57.30, Diverticulosis of large intestine without                            perforation or abscess without bleeding CPT copyright 2022 American [employer] Association. All rights reserved. The codes documented in this report are preliminary and upon coder review may  be revised to meet current compliance requirements. [name], MD 03/14/2024 11:41:36 AM This report has been signed electronically. Number of Addenda: 0

## 2024-03-14 NOTE — Progress Notes (Signed)
 "  PROGRESS NOTE    Desiree Leon  FMW:[id number] DOB: 06-27-1940 DOA: 03/09/2024 PCP: Shepard Ade, MD   Brief Narrative: Desiree Leon is a 84 y.o. female with a history of hypertension, hyperlipidemia, stroke, diabetes mellitus type 2, hypothyroidism, CKD stage IIIa, CAD, carotid artery stenosis, PAD, mesenteric stenosis s/p stent, heart failure, GI bleeding, anxiety, depression, COPD, chronic respiratory failure on oxygen .  Patient presented secondary to GI bleeding with associated anemia. GI consulted and capsule endoscopy performed on 03/10/24 revealing no source of bleeding. Colonoscopy on 1/5 significant for AVMs but unable to ablate.   Assessment and Plan:  GI bleed Patient with a history of prior GI bleed with workup significant for diverticulosis, polyps and ectatic lesions, now presenting with dark and bright red stools. Complicated by aspirin , Plavix  and Eliquis  use. Gastroenterology consulted on admission. Capsule endoscopy performed on 1/1 without identification of bleeding source. Colonoscopy performed on 1/5 and was significant for multiple AVMs without evidence of bleeding; cauterization unsuccessful and patient required hemostatic spray to stop active bleeding during the procedure. Biopsy of congested colon was also obtained. Hemorrhoids identified. - GI recommendations: full liquid diet  Chronic normocytic anemia Acute blood loss anemia Baseline hemoglobin around 10 g/dL. Hemoglobin of 7.4 g/dL on admission with improvement to 7.9 g/dL after 1 unit of PRBC via transfusion. Hemoglobin down to 7.8 g/dL with patient reporting dyspnea with exertion, worse than baseline; patient transfused a second unit of PRBC on 1/4 with post-transfusion hemoglobin of 8.8 g/dL.  Chronic respiratory failure with hypoxia Patient is managed on 3 L/min of supplemental oxygen  at baseline. Stable. - Continue oxygen   Dyspnea on exertion Unclear etiology. Patient without wheezing to suggest  COPD exacerbation. Rales on exam suggests possible fluid overload or atelectasis. Chest x-ray without significant disease. - Will start doxycycline  for possible bronchitis - Lasix  IV x1 pending BMP - Ambulatory pulse ox prior to discharge  Primary hypertension - Continue metoprolol  tartrate  Hyperlipidemia - Continue Zetia   Diabetes mellitus type 2 Well controlled based on hemoglobin A1C of 6.4% from January 2025.  Possible aspiration Patient with cough after eating and history of choking on her saliva. She reports never having been evaluated by speech therapy. Speech therapy consulted and recommend ENT follow-up as an outpatient.  Hypothyroidism - Continue  Synthroid  137 mcg daily  CAD Currently no chest pain. Last heart catheterization from April 2025. Patient is managed on aspirin , Crestor , Zetia , Ranexa , metoprolol , but is also on Plavix  from per vascular surgery placement of a mesenteric artery stent. - Continue Ranexa   PAD Mesenteric artery stenosis Patient follows with vascular surgery as an outpatient with recent stent placement on 12/9. Patient was placed on Aspirin , Plavix  and maintained on Eliquis  with plan to continue triple therapy until 03/18/24. Plavix  and Eliquis  held secondary to GI bleeding. Vascular surgery contacted this admission and recommended to continue Aspirin . - Continue Aspirin   Chronic HFpEF Stable.  Carotid artery disease - Continue Crestor  and Zetia   Depression Anxiety - Continue Lexapro   History of PE Patient is managed on Eliquis  as an outpatient which is held secondary to GI bleeding.   DVT prophylaxis: SCDs Code Status:   Code Status: Full Code Family Communication: Daughter at bedside Disposition Plan: Discharge pending ongoing GI recommendations/management, stable hemoglobin and resolution of GI bleeding   Consultants:  [facility]  Procedures:  Capsule endoscopy Colonoscopy  Antimicrobials: None     Subjective: Patient still with some dyspnea on exertion. Coughing without sputum. She feels like she could  get something up, though.  Objective: BP (!) 151/65 (BP Location: Left Arm)   Pulse (!) 53   Temp 98.6 F (37 C) (Oral)   Resp 15   Ht 5' 3 (1.6 m)   Wt 85.3 kg   SpO2 100%   BMI 33.31 kg/m   Examination:  General exam: Appears calm and comfortable. Respiratory system: Diminished with bibasilar rales Cardiovascular system: S1 & S2 heard, RRR. Gastrointestinal system: Abdomen is distended, soft and nontender. Normal bowel sounds heard. Central nervous system: Alert and oriented. No focal neurological deficits. Psychiatry: Judgement and insight appear normal. Mood & affect appropriate.    Data Reviewed: I have personally reviewed following labs and imaging studies  CBC Lab Results  Component Value Date   WBC 6.5 03/13/2024   RBC 2.89 (L) 03/13/2024   HGB 8.8 (L) 03/13/2024   HCT 27.5 (L) 03/13/2024   MCV 95.2 03/13/2024   MCH 30.4 03/13/2024   PLT 198 03/13/2024   MCHC 32.0 03/13/2024   RDW 15.5 03/13/2024   LYMPHSABS 2.7 12/10/2023   MONOABS 0.7 12/10/2023   EOSABS 0.1 12/10/2023   BASOSABS 0.0 12/10/2023     Last metabolic panel Lab Results  Component Value Date   NA 140 03/10/2024   K 3.9 03/10/2024   CL 106 03/10/2024   CO2 26 03/10/2024   BUN 9 03/10/2024   CREATININE 0.71 03/10/2024   GLUCOSE 130 (H) 03/10/2024   GFRNONAA >60 03/10/2024   GFRAA >60 04/12/2019   CALCIUM  8.1 (L) 03/10/2024   PROT 5.3 (L) 03/10/2024   ALBUMIN 3.3 (L) 03/10/2024   BILITOT 0.8 03/10/2024   ALKPHOS 73 03/10/2024   AST 20 03/10/2024   ALT 8 03/10/2024   ANIONGAP 8 03/10/2024    GFR: Estimated Creatinine Clearance: 54.2 mL/min (by C-G formula based on SCr of 0.71 mg/dL).  No results found for this or any previous visit (from the past 240 hours).    Radiology Studies: DG CHEST PORT 1 VIEW Result Date: 03/13/2024 EXAM: 1 VIEW(S) XRAY OF THE CHEST  03/13/2024 11:06:00 AM COMPARISON: 03/12/2024 CLINICAL HISTORY: Bilateral rales FINDINGS: LUNGS AND PLEURA: Left basilar linear opacities, which may represent atelectasis or scarring. Trace left pleural effusion. No pneumothorax. HEART AND MEDIASTINUM: Cardiomegaly. Atherosclerotic calcifications. BONES AND SOFT TISSUES: No acute osseous abnormality. IMPRESSION: 1. Left basilar linear atelectasis vs scar. 2. Trace left pleural effusion. 3. Cardiomegaly. Electronically signed by: Waddell Calk MD 03/13/2024 11:51 AM EST RP Workstation: [id number]      LOS: 0 days    Elgin Lam, MD [facility] 03/14/2024, 12:51 PM   If 7PM-7AM, please contact night-coverage [email or web address]  "

## 2024-03-14 NOTE — Progress Notes (Signed)
 Transition of Care [facility]) - Inpatient Brief Assessment   Patient Details  Name: Desiree Leon MRN: [id number] Date of Birth: Jun 28, 1940  [facility] CM/SW Contact:    Rosaline JONELLE Joe, RN Phone Number: 03/14/2024, 3:37 PM   Clinical Narrative: CM met with the patient at the bedside to discuss IP Care management needs.  The patient lives at home and has family assisting with her care as needed.  The patient lives alone but has availability to multiple family members that live in her neighborhood.  The patient's daughter stays with the patient during the day since daughter works remotely.  DME at the home includes [employer] home oxygen , [employer].  Patient plans to return home when stable - likely tomorrow per daughter.  Patient will discharge home by car with family when stable.  Daughter states that patient has portable oxygen  through [employer] at home to transport to home when ready.    Transition of Care Asessment: Insurance and Status: (P) Insurance coverage has been reviewed Patient has primary care physician: (P) Yes Home environment has been reviewed: (P) from home Prior level of function:: (P) self Prior/Current Home Services: (P) No current home services Social Drivers of Health Review: (P) SDOH reviewed no interventions necessary Readmission risk has been reviewed: (P) Yes Transition of care needs: (P) no transition of care needs at this time

## 2024-03-14 NOTE — Anesthesia Postprocedure Evaluation (Signed)
"   Anesthesia Post Note  Patient: Desiree Leon  Procedure(s) Performed: COLONOSCOPY COLONOSCOPY, WITH ARGON PLASMA COAGULATION CONTROL OF HEMORRHAGE, GI TRACT, ENDOSCOPIC, BY HEMOSTATIC POWDER APPLICATION BIOPSY, SKIN, SUBCUTANEOUS TISSUE, OR MUCOUS MEMBRANE     Patient location during evaluation: PACU Anesthesia Type: MAC Level of consciousness: awake and alert and oriented Pain management: pain level controlled Vital Signs Assessment: post-procedure vital signs reviewed and stable Respiratory status: spontaneous breathing, nonlabored ventilation, respiratory function stable and patient connected to nasal cannula oxygen  Cardiovascular status: stable and blood pressure returned to baseline Postop Assessment: no apparent nausea or vomiting Anesthetic complications: no   No notable events documented.  Last Vitals:  Vitals:   03/14/24 1140 03/14/24 1150  BP: (!) 140/64 (!) 146/48  Pulse: (!) 57 (!) 56  Resp: 16 15  Temp:    SpO2: 96% 98%    Last Pain:  Vitals:   03/14/24 1150  TempSrc:   PainSc: 0-No pain                 Regnald Bowens A.      "

## 2024-03-15 ENCOUNTER — Encounter (HOSPITAL_COMMUNITY): Payer: Self-pay | Admitting: Gastroenterology

## 2024-03-15 LAB — CBC
HCT: 29.2 % — ABNORMAL LOW (ref 36.0–46.0)
Hemoglobin: 9.4 g/dL — ABNORMAL LOW (ref 12.0–15.0)
MCH: 30.2 pg (ref 26.0–34.0)
MCHC: 32.2 g/dL (ref 30.0–36.0)
MCV: 93.9 fL (ref 80.0–100.0)
Platelets: 213 K/uL (ref 150–400)
RBC: 3.11 MIL/uL — ABNORMAL LOW (ref 3.87–5.11)
RDW: 15 % (ref 11.5–15.5)
WBC: 6 K/uL (ref 4.0–10.5)
nRBC: 0 % (ref 0.0–0.2)

## 2024-03-15 LAB — GLUCOSE, CAPILLARY
Glucose-Capillary: 128 mg/dL — ABNORMAL HIGH (ref 70–99)
Glucose-Capillary: 95 mg/dL (ref 70–99)

## 2024-03-15 LAB — SURGICAL PATHOLOGY

## 2024-03-15 MED ORDER — DOXYCYCLINE HYCLATE 100 MG PO TABS: 100.0000 mg | ORAL_TABLET | Freq: Two times a day (BID) | ORAL | 0 refills | Status: AC

## 2024-03-15 NOTE — Plan of Care (Signed)

## 2024-03-15 NOTE — Discharge Instructions (Signed)
 Desiree Leon,  You were in the hospital because of bleeding from your GI system, related to your aspirin , Plavix  and Eliquis . This stopped on its own. During your workup, more AVMs were seen during your colonoscopy. I spoke with Dr. Serene and he would like for you to continue your Aspirin  and Eliquis ; you can discontinue your Plavix . You can resume your Eliquis  on 03/16/24. Please follow-up with your PCP and your GI physician.

## 2024-03-15 NOTE — Progress Notes (Signed)
 Reviewed AVS, patient expressed understanding of medications, MD follow up reviewed.   Removed IV, Site clean, dry and intact.  Patient states all belongings brought to the hospital at time of admission are accounted for and packed to take home.  Patient informed and expressed understanding where to pick up discharge medications.  Nursing staff contacted to transport patient to entrance A, where family member was waiting in vehicle to transport home.

## 2024-03-15 NOTE — Discharge Summary (Signed)
 " Physician Discharge Summary   Patient: Desiree Leon MRN: [id number] DOB: 1940/12/09  Admit date:     03/09/2024  Discharge date: 03/15/2024  Discharge Physician: Elgin Lam, MD   PCP: Shepard Ade, MD   Recommendations at discharge:  PCP visit for hospital follow-up GI follow-up in 6-8 weeks  Discharge Diagnoses: Principal Problem:   Acute GI bleeding Active Problems:   History of pulmonary embolus (PE)   Hypothyroidism   Essential hypertension   Dyslipidemia   Gastro-esophageal reflux disease without esophagitis   COPD (chronic obstructive pulmonary disease) (HCC)   Anxiety disorder   Carotid artery occlusion   Chronic anemia   Chronic combined systolic and diastolic heart failure (HCC)   Hyperlipidemia   Oxygen  dependent   Type 2 diabetes mellitus with other circulatory complications (HCC)   Depression   History of CVA (cerebrovascular accident)   CAD (coronary artery disease)   Mesenteric artery stenosis   CKD stage 3a, GFR 45-59 ml/min (HCC)   Chronic respiratory failure (HCC)  Resolved Problems:   Chronic kidney disease, stage 2 (mild)   Hypertensive heart and chronic kidney disease with heart failure and stage 1 through stage 4 chronic kidney disease, or unspecified chronic kidney disease [facility])  Hospital Course: SAUMYA Leon is a 84 y.o. female with a history of hypertension, hyperlipidemia, stroke, diabetes mellitus type 2, hypothyroidism, CKD stage IIIa, CAD, carotid artery stenosis, PAD, mesenteric stenosis s/p stent, heart failure, GI bleeding, anxiety, depression, COPD, chronic respiratory failure on oxygen .  Patient presented secondary to GI bleeding with associated anemia. GI consulted and capsule endoscopy performed on 03/10/24 revealing no source of bleeding. Colonoscopy on 1/5 significant for AVMs s/p fulguration and application of hemostatic spray. Hemoglobin stable. Normal bowel movements prior to discharge.  Assessment and Plan:  GI  bleed Patient with a history of prior GI bleed with workup significant for diverticulosis, polyps and ectatic lesions, now presenting with dark and bright red stools. Complicated by aspirin , Plavix  and Eliquis  use. Gastroenterology consulted on admission. Capsule endoscopy performed on 1/1 without identification of bleeding source. Colonoscopy performed on 1/5 and was significant for multiple AVMs without evidence of bleeding s/p fulguration and application of hemostatic spray to stop active bleeding during the procedure. Biopsy of congested colon was also obtained. Hemorrhoids identified as well.   Chronic normocytic anemia Acute blood loss anemia Baseline hemoglobin around 10 g/dL. Hemoglobin of 7.4 g/dL on admission with improvement to 7.9 g/dL after 1 unit of PRBC via transfusion. Hemoglobin down to 7.8 g/dL with patient reporting dyspnea with exertion, worse than baseline; patient transfused a second unit of PRBC on 1/4 with post-transfusion hemoglobin of 8.8 g/dL. Hemoglobin of 9.4 g/dL on day of discharge.   Chronic respiratory failure with hypoxia Patient is managed on 3 L/min of supplemental oxygen  at baseline. Stable.   Dyspnea on exertion Unclear etiology. Patient without wheezing to suggest COPD exacerbation. Rales on exam suggests possible fluid overload or atelectasis. Chest x-ray without significant disease. Doxycycline  started for possible bronchitis.   Primary hypertension Continue metoprolol  tartrate.   Hyperlipidemia Continue Zetia .   Diabetes mellitus type 2 Well controlled based on hemoglobin A1C of 6.4% from January 2025.   Possible aspiration Patient with cough after eating and history of choking on her saliva. She reports never having been evaluated by speech therapy. Speech therapy consulted and recommend ENT follow-up as an outpatient.   Hypothyroidism Continue  Synthroid  137 mcg daily.   CAD Currently no chest pain. Last heart catheterization  from April 2025.  Patient is managed on aspirin , Crestor , Zetia , Ranexa , metoprolol , but is also on Plavix  from per vascular surgery placement of a mesenteric artery stent. Continue Ranexa .   PAD Mesenteric artery stenosis Patient follows with vascular surgery as an outpatient with recent stent placement on 12/9. Patient was placed on Aspirin , Plavix  and maintained on Eliquis  with plan to continue triple therapy until 03/18/24. Plavix  and Eliquis  held secondary to GI bleeding. Vascular surgery contacted this admission and recommended to continue Aspirin . Discussed with vascular surgery who recommend to stop Plavix . Continue aspirin  on discharge and continue Eliquis  on 1/7 per GI recommendations.   Chronic HFpEF Stable.   Carotid artery disease Continue Crestor  and Zetia .   Depression Anxiety Continue Lexapro .   History of PE Patient is managed on Eliquis  as an outpatient which is held secondary to GI bleeding.   Consultants:  [facility]   Procedures:  Capsule endoscopy Colonoscopy  Disposition: Home health Diet recommendation: Cardiac and Carb modified diet   DISCHARGE MEDICATION: Allergies as of 03/15/2024       Reactions   Other Anaphylaxis, Cough   fragarances-perfumes   Dilaudid [hydromorphone Hcl] Nausea Only   Erythromycin Nausea And Vomiting   Diflucan [fluconazole] Palpitations, Hypertension   When taken with Nicardipine , reaction happens.        Medication List     PAUSE taking these medications    Eliquis  2.5 MG Tabs tablet Wait to take this until: March 16, 2024 Generic drug: apixaban  Take 1 tablet (2.5 mg total) by mouth 2 (two) times daily. Resume on 4/8 evening       STOP taking these medications    clopidogrel  75 MG tablet Commonly known as: Plavix        TAKE these medications    acetaminophen  500 MG tablet Commonly known as: TYLENOL  Take 500 mg by mouth daily as needed for fever, headache or mild pain (pain score 1-3).   albuterol  108 (90  Base) MCG/ACT inhaler Commonly known as: VENTOLIN  HFA Inhale 1-2 puffs into the lungs every 6 (six) hours as needed for wheezing or shortness of breath.   Align 4 MG Caps Take 4 mg by mouth daily.   ALPRAZolam  0.25 MG tablet Commonly known as: XANAX  Take 0.25 mg by mouth daily as needed for anxiety.   aspirin  EC 81 MG tablet Take 1 tablet (81 mg total) by mouth daily. Swallow whole.   budesonide -formoterol  160-4.5 MCG/ACT inhaler Commonly known as: Symbicort  Inhale 2 puffs into the lungs 2 (two) times daily. What changed:  when to take this additional instructions   cholecalciferol  25 MCG (1000 UNIT) tablet Commonly known as: VITAMIN D3 Take 2,000 Units by mouth daily.   docusate sodium  100 MG capsule Commonly known as: COLACE Take 100 mg by mouth in the morning.   doxycycline  100 MG tablet Commonly known as: VIBRA -TABS Take 1 tablet (100 mg total) by mouth 2 (two) times daily for 4 days.   escitalopram  5 MG tablet Commonly known as: LEXAPRO  Take 5 mg by mouth every evening.   ezetimibe  10 MG tablet Commonly known as: ZETIA  Take 10 mg by mouth daily.   furosemide  20 MG tablet Commonly known as: LASIX  Take 20 mg by mouth daily as needed for fluid or edema.   levothyroxine  137 MCG tablet Commonly known as: SYNTHROID  Take 137 mcg by mouth every morning.   metoprolol  tartrate 25 MG tablet Commonly known as: LOPRESSOR  Take 0.5 tablets (12.5 mg total) by mouth 2 (two) times daily.  multivitamin-iron -minerals-folic acid chewable tablet Chew 1 tablet by mouth daily.   niCARdipine  20 MG capsule Commonly known as: CARDENE  Take 20 mg by mouth at bedtime.   nitroGLYCERIN  0.4 mg/hr patch Commonly known as: NITRODUR - Dosed in mg/24 hr Place 0.4 mg onto the skin daily.   nitroGLYCERIN  0.4 MG SL tablet Commonly known as: NITROSTAT  Place 1 tablet (0.4 mg total) under the tongue every 5 (five) minutes as needed for chest pain.   pantoprazole  40 MG tablet Commonly  known as: PROTONIX  Take 1 tablet (40 mg total) by mouth daily at 12 noon. What changed: when to take this   Pataday 0.1 % ophthalmic solution Generic drug: olopatadine Place 1 drop into both eyes daily. 24 hr   ranolazine  500 MG 12 hr tablet Commonly known as: RANEXA  TAKE ONE TABLET BY MOUTH TWICE DAILY   rosuvastatin  40 MG tablet Commonly known as: CRESTOR  Take 40 mg by mouth every evening. What changed: Another medication with the same name was removed. Continue taking this medication, and follow the directions you see here.   triamcinolone cream 0.1 % Commonly known as: KENALOG Apply 1 Application topically daily. On both legs.With vitamin E oil        Follow-up Information     Shepard Ade, MD. Schedule an appointment as soon as possible for a visit in 1 week(s).   Specialty: Internal Medicine Why: For hospital follow-up Contact information: [address] [place] [address] [phone]         Saintclair Jasper, MD. Schedule an appointment as soon as possible for a visit in 6 week(s).   Specialty: Gastroenterology Why: For hospital follow-up Contact information: [address] [place] [address] [phone]                Discharge Exam: BP 129/83 (BP Location: Left Arm)   Pulse (!) 54   Temp 97.7 F (36.5 C) (Oral)   Resp 17   Ht 5' 3 (1.6 m)   Wt 85.3 kg   SpO2 100%   BMI 33.31 kg/m   General exam: Appears calm and comfortable. Respiratory system: Clear to auscultation. Respiratory effort normal. Cardiovascular system: S1 & S2 heard, RRR. Gastrointestinal system: Abdomen is nondistended, soft and nontender. Normal bowel sounds heard. Central nervous system: Alert and oriented. No focal neurological deficits. Musculoskeletal: No edema. No calf tenderness Psychiatry: Judgement and insight appear normal. Mood & affect appropriate.   Condition at discharge: stable  The results of significant diagnostics from this  hospitalization (including imaging, microbiology, ancillary and laboratory) are listed below for reference.   Imaging Studies: DG CHEST PORT 1 VIEW Result Date: 03/13/2024 EXAM: 1 VIEW(S) XRAY OF THE CHEST 03/13/2024 11:06:00 AM COMPARISON: 03/12/2024 CLINICAL HISTORY: Bilateral rales FINDINGS: LUNGS AND PLEURA: Left basilar linear opacities, which may represent atelectasis or scarring. Trace left pleural effusion. No pneumothorax. HEART AND MEDIASTINUM: Cardiomegaly. Atherosclerotic calcifications. BONES AND SOFT TISSUES: No acute osseous abnormality. IMPRESSION: 1. Left basilar linear atelectasis vs scar. 2. Trace left pleural effusion. 3. Cardiomegaly. Electronically signed by: Waddell Calk MD 03/13/2024 11:51 AM EST RP Workstation: [id number]   DG CHEST PORT 1 VIEW Result Date: 03/12/2024 EXAM: 1 VIEW(S) XRAY OF THE CHEST 03/12/2024 01:00:00 AM COMPARISON: 12/10/2023. CLINICAL HISTORY: SOB (shortness of breath). FINDINGS: LUNGS AND PLEURA: No focal pulmonary opacity. No pleural effusion. No pneumothorax. HEART AND MEDIASTINUM: Aortic atherosclerosis. No acute abnormality of the cardiac and mediastinal silhouettes. BONES AND SOFT TISSUES: No acute osseous abnormality. IMPRESSION: 1. No  acute process. Electronically signed by: Franky Crease MD 03/12/2024 01:05 AM EST RP Workstation: [id number]   PERIPHERAL VASCULAR CATHETERIZATION Result Date: 02/16/2024 Images from the original result were not included. Patient name: KHANIYAH BEZEK MRN: [id number] DOB: 1940-11-04 Sex: female 02/16/2024 Pre-operative Diagnosis: Mesenteric stenosis Post-operative diagnosis:  Same Surgeon:  [name] Procedure Performed:  1.  Ultrasound-guided access, left brachial artery  2.  Abdominal aortogram  3.  Stent, superior mesenteric artery  4.  Conscious sedation, 61 minutes Indications: This is an 84 year old female with chronic mesenteric ischemia.  I attempted to cross her SMA several weeks ago but ended up in a dissection  plane and so I had to abort.  I brought her back to the office with a CT scan that shows a persistent dissection that begins at the level of the first branch however she still has persistent ostial stenosis which we talked about treating today. Procedure:  The patient was identified in the holding area and taken to room 8.  The patient was then placed supine on the table and prepped and draped in the usual sterile fashion.  A time out was called.  Conscious sedation was administered with the use of IV fentanyl  and Versed  under continuous physician and nurse monitoring.  Heart rate, blood pressure, and oxygen  saturation were continuously monitored.  Total sedation time was 61 minutes.  Ultrasound was used to evaluate the left brachial artery which was widely patent and easily compressible.  1% lidocaine  was used for local anesthesia.  Left brachial artery was cannulated under ultrasound guidance with a micropuncture needle.  A 018 wire was inserted followed by placement of micropuncture sheath.  Next a Bentson wire was placed and a 6 French sheath was inserted.  Using a Omni Flush catheter, a Glidewire advantage was directed into the descending thoracic aorta.  Next a pigtail catheter was inserted to the level of T12 and an abdominal aortogram was performed which identified the visceral vessels in the SMA stenosis which was approximately 70%.  Next, I upsized to a 6 x 90 sheath.  The patient was fully heparinized.  Using a Berenstein 2 catheter and a V-18 wire, the superior mesenteric artery was selected.  Lesion was primarily stented with a Gore VBX 6 x 19.  Completion imaging showed resolution of the stenosis.  I exchanged out for a short 6 French sheath and the patient will be taken the holding her for sheath pull once her coagulation profile corrects. Impression:  #1  Successful stenting of ostial supra mesenteric artery stenosis using a 6 x 19 VBX from the left brachial approach  #2: The patient will require  triple therapy for 1 month and then go back to Eliquis  alone  V. [name], M.D., FACS [facility] Office: [phone] Pager:  [phone]    Microbiology: Results for orders placed or performed during the hospital encounter of 04/04/23  Blood culture (routine x 2)     Status: None   Collection Time: 04/04/23  1:20 PM   Specimen: BLOOD  Result Value Ref Range Status   Specimen Description BLOOD RIGHT ANTECUBITAL  Final   Special Requests   Final    BOTTLES DRAWN AEROBIC AND ANAEROBIC Blood Culture results may not be optimal due to an inadequate volume of blood received in culture bottles   Culture   Final    NO GROWTH 5 DAYS Performed at [facility], [address] [place], [place] [address]  Report Status 04/09/2023 FINAL  Final  Resp panel by RT-PCR (RSV, Flu A&B, Covid) Anterior Nasal Swab     Status: None   Collection Time: 04/04/23  1:33 PM   Specimen: Anterior Nasal Swab  Result Value Ref Range Status   SARS Coronavirus 2 by RT PCR NEGATIVE NEGATIVE Final   Influenza A by PCR NEGATIVE NEGATIVE Final   Influenza B by PCR NEGATIVE NEGATIVE Final    Comment: (NOTE) The Xpert Xpress SARS-CoV-2/FLU/RSV plus assay is intended as an aid in the diagnosis of influenza from Nasopharyngeal swab specimens and should not be used as a sole basis for treatment. Nasal washings and aspirates are unacceptable for Xpert Xpress SARS-CoV-2/FLU/RSV testing.  Fact Sheet for Patients: bloggercourse.com  Fact Sheet for Healthcare Providers: seriousbroker.it  This test is not yet approved or cleared by the [place]  FDA and has been authorized for detection and/or diagnosis of SARS-CoV-2 by FDA under an Emergency Use Authorization (EUA). This EUA will remain in effect (meaning this test can be used) for the duration of the COVID-19 declaration under Section 564(b)(1) of the Act, 21  U.S.C. section 360bbb-3(b)(1), unless the authorization is terminated or revoked.     Resp Syncytial Virus by PCR NEGATIVE NEGATIVE Final    Comment: (NOTE) Fact Sheet for Patients: bloggercourse.com  Fact Sheet for Healthcare Providers: seriousbroker.it  This test is not yet approved or cleared by the [place]  FDA and has been authorized for detection and/or diagnosis of SARS-CoV-2 by FDA under an Emergency Use Authorization (EUA). This EUA will remain in effect (meaning this test can be used) for the duration of the COVID-19 declaration under Section 564(b)(1) of the Act, 21 U.S.C. section 360bbb-3(b)(1), unless the authorization is terminated or revoked.  Performed at [facility], [address] [place], [place] [address]   Blood culture (routine x 2)     Status: None   Collection Time: 04/04/23  5:20 PM   Specimen: BLOOD RIGHT FOREARM  Result Value Ref Range Status   Specimen Description BLOOD RIGHT FOREARM  Final   Special Requests   Final    BOTTLES DRAWN AEROBIC AND ANAEROBIC Blood Culture results may not be optimal due to an inadequate volume of blood received in culture bottles   Culture   Final    NO GROWTH 5 DAYS Performed at [facility], [address] [place], [place] [address]    Report Status 04/09/2023 FINAL  Final    Labs: CBC: Recent Labs  Lab 03/10/24 0751 03/10/24 2126 03/11/24 0150 03/12/24 0541 03/12/24 1909 03/13/24 0554 03/15/24 1227  WBC 6.7  --  6.7 7.2  --  6.5 6.0  HGB 7.8*   < > 8.0* 7.8* 8.8* 8.8* 9.4*  HCT 23.8*   < > 25.0* 24.9* 27.4* 27.5* 29.2*  MCV 91.5  --  94.3 96.1  --  95.2 93.9  PLT 230  --  231 212  --  198 213   < > = values in this interval not displayed.   Basic Metabolic Panel: Recent Labs  Lab 03/09/24 1032 03/10/24 0751 03/14/24 1431  NA 140 140 139  K 3.2* 3.9 3.4*  CL 103 106 104  CO2 25 26 26   GLUCOSE 144* 130* 151*  BUN 15 9 <5*   CREATININE 0.73 0.71 0.66  CALCIUM  8.3* 8.1* 8.5*   Liver Function Tests: Recent Labs  Lab 03/09/24 1032 03/10/24 0751  AST 18 20  ALT 11 8  ALKPHOS 82  73  BILITOT 0.5 0.8  PROT 6.1* 5.3*  ALBUMIN 3.8 3.3*   CBG: Recent Labs  Lab 03/14/24 1230 03/14/24 1658 03/14/24 2142 03/15/24 0826 03/15/24 1255  GLUCAP 121* 178* 151* 128* 95    Discharge time spent: 35 minutes.  Signed: Elgin Lam, MD [facility] 03/15/2024 "

## 2024-03-15 NOTE — Progress Notes (Signed)
 [facility] Gastroenterology Progress Note  Desiree Leon 84 y.o. Feb 22, 1941   Subjective: Reports brown stool. Hungry.  Objective: Vital signs: Vitals:   03/15/24 0730 03/15/24 0825  BP: (!) 148/56 (!) 164/61  Pulse: (!) 55 (!) 58  Resp: 17 18  Temp: 97.8 F (36.6 C) (!) 97.5 F (36.4 C)  SpO2: 98% 96%    Physical Exam: Gen: elderly, obese, lethargic, no acute distress, pleasant HEENT: anicteric sclera CV: RRR Chest: CTA B Abd: lower abdominal tenderness with guarding, soft, nondistended, +BS Ext: no edema  Lab Results: Recent Labs    03/14/24 1431  NA 139  K 3.4*  CL 104  CO2 26  GLUCOSE 151*  BUN <5*  CREATININE 0.66  CALCIUM  8.5*   No results for input(s): AST, ALT, ALKPHOS, BILITOT, PROT, ALBUMIN in the last 72 hours. Recent Labs    03/12/24 1909 03/13/24 0554  WBC  --  6.5  HGB 8.8* 8.8*  HCT 27.4* 27.5*  MCV  --  95.2  PLT  --  198      Assessment/Plan: GI bleed - cecal AVMs that were fulgurated and hemostatic spray applied. Check CBC. Soft diet. Ok to resume anticoagulation tomorrow. Home today from [facility] if Hgb greated than 8. Will sign off. Call if questions. F/U with Dr. Saintclair in 6-8 weeks.   [name] 03/15/2024, 11:30 AM  Questions please call [phone] ID: Desiree Leon Blush, female   DOB: November 21, 1940, 84 y.o.   MRN: [id number]

## 2024-03-17 ENCOUNTER — Ambulatory Visit (HOSPITAL_COMMUNITY)
Admission: RE | Admit: 2024-03-17 | Discharge: 2024-03-17 | Disposition: A | Discharge status: Home / Self Care | Attending: Surgery | Admitting: Surgery

## 2024-03-17 DIAGNOSIS — K551 Chronic vascular disorders of intestine: Secondary | ICD-10-CM | POA: Insufficient documentation

## 2024-03-18 ENCOUNTER — Ambulatory Visit (HOSPITAL_BASED_OUTPATIENT_CLINIC_OR_DEPARTMENT_OTHER)
Admission: RE | Admit: 2024-03-18 | Discharge: 2024-03-18 | Disposition: A | Source: Ambulatory Visit | Attending: Pulmonary Disease

## 2024-03-18 DIAGNOSIS — R911 Solitary pulmonary nodule: Secondary | ICD-10-CM | POA: Insufficient documentation

## 2024-03-18 DIAGNOSIS — J9811 Atelectasis: Secondary | ICD-10-CM | POA: Diagnosis not present

## 2024-03-18 DIAGNOSIS — J90 Pleural effusion, not elsewhere classified: Secondary | ICD-10-CM | POA: Diagnosis not present

## 2024-03-21 ENCOUNTER — Ambulatory Visit: Attending: Surgery | Admitting: Surgery

## 2024-03-21 ENCOUNTER — Encounter: Payer: Self-pay | Admitting: Surgery

## 2024-03-21 VITALS — BP 102/58 | HR 59 | Temp 98.0°F | Resp 22 | Ht 63.0 in | Wt 188.0 lb

## 2024-03-21 DIAGNOSIS — K551 Chronic vascular disorders of intestine: Secondary | ICD-10-CM | POA: Diagnosis not present

## 2024-03-21 NOTE — Progress Notes (Signed)
 "                                    [facility] [place]  Patient name: Desiree Leon MRN: [id number] DOB: [birth year] Sex: female   REASON FOR VISIT:    Follow up  HISOTRY OF PRESENT ILLNESS:    Desiree Leon is a 84 y.o. female with a 2-year history of abdominal pain.  This has occasionally been postprandial without any significant weight loss.  She had a CT scan that showed a greater than 95% SMA stenosis.  We discussed proceeding with angiography for better evaluation and possible stenting.  This was first performed on 12/29/2023 however I was unable to cross the lesion secondary to getting into a dissection flap.  A second attempt was performed on 02/16/2024 from a brachial approach and a 6 x 19 VBX was placed.  She was to be on triple therapy for a month and then switch to aspirin  and Eliquis .  Unfortunately she ended up having a GI bleed and was hospitalized.  She was ultimately discharged on aspirin  and Eliquis  having discontinued the Plavix .  She is here today stating that her abdominal pain is a little better but she still has some left lower quadrant pai  She suffers from COPD and is on oxygen .  She is medically managed for hypertension.  She is on a statin for hypercholesterolemia.  She is on anticoagulation.  She has a history of coronary artery disease but has not had a stent.   PAST MEDICAL HISTORY:   Past Medical History:  Diagnosis Date   Acute diastolic heart failure (HCC) 03/09/2024   Acute pulmonary embolism (HCC) 05/21/2021   Anxiety    Blood transfusion    CAP (community acquired pneumonia)    COPD (chronic obstructive pulmonary disease) (HCC)    Heart murmur    History of colon polyps    Hyperlipidemia    Hypertension    MI (myocardial infarction) (HCC)    Mitral valve prolapse    NSTEMI (non-ST elevated myocardial infarction) (HCC)    Osteoarthritis    Osteoporosis    Pulmonary emboli (HCC) 04/12/2019   Spinal stenosis    Thyroid   disease    hypothyroidism   Unstable angina (HCC) 05/08/2014   Venous insufficiency      FAMILY HISTORY:   Family History  Problem Relation Age of Onset   Heart disease Mother    Heart disease Father    Cancer Sister        breast   Cancer Daughter        cervical and bone   Heart disease Sister    Thyroid  disease Sister     SOCIAL HISTORY:   Social History   Tobacco Use   Smoking status: Former    Current packs/day: 0.00    Average packs/day: 0.5 packs/day for 30.0 years (15.0 ttl pk-yrs)    Types: Cigarettes    Start date: 03/11/1975    Quit date: 03/10/2005    Years since quitting: 19.0   Smokeless tobacco: Never  Substance Use Topics   Alcohol  use: No     ALLERGIES:   Allergies[1]   CURRENT MEDICATIONS:   Current Outpatient Medications  Medication Sig Dispense Refill   acetaminophen  (TYLENOL ) 500 MG tablet Take 500 mg by mouth daily as needed for fever, headache or mild pain (pain score 1-3).  albuterol  (VENTOLIN  HFA) 108 (90 Base) MCG/ACT inhaler Inhale 1-2 puffs into the lungs every 6 (six) hours as needed for wheezing or shortness of breath. 8 g 11   ALPRAZolam  (XANAX ) 0.25 MG tablet Take 0.25 mg by mouth daily as needed for anxiety.     aspirin  EC 81 MG tablet Take 1 tablet (81 mg total) by mouth daily. Swallow whole. 150 tablet 2   budesonide -formoterol  (SYMBICORT ) 160-4.5 MCG/ACT inhaler Inhale 2 puffs into the lungs 2 (two) times daily. (Patient taking differently: Inhale 2 puffs into the lungs daily. Uses symbicort  in the morning and albuterol  at night.) 1 each 12   cholecalciferol  (VITAMIN D3) 25 MCG (1000 UNIT) tablet Take 2,000 Units by mouth daily.     docusate sodium  (COLACE) 100 MG capsule Take 100 mg by mouth in the morning.     ELIQUIS  2.5 MG TABS tablet Take 1 tablet (2.5 mg total) by mouth 2 (two) times daily. Resume on 4/8 evening 180 tablet 3   escitalopram  (LEXAPRO ) 5 MG tablet Take 5 mg by mouth every evening.      ezetimibe  (ZETIA ) 10  MG tablet Take 10 mg by mouth daily.     furosemide  (LASIX ) 20 MG tablet Take 20 mg by mouth daily as needed for fluid or edema.     levothyroxine  (SYNTHROID ) 137 MCG tablet Take 137 mcg by mouth every morning.     metoprolol  tartrate (LOPRESSOR ) 25 MG tablet Take 0.5 tablets (12.5 mg total) by mouth 2 (two) times daily. 90 tablet 3   multivitamin-iron -minerals-folic acid (CENTRUM) chewable tablet Chew 1 tablet by mouth daily.     niCARdipine  (CARDENE ) 20 MG capsule Take 20 mg by mouth at bedtime.     nitroGLYCERIN  (NITRODUR - DOSED IN MG/24 HR) 0.4 mg/hr patch Place 0.4 mg onto the skin daily.     nitroGLYCERIN  (NITROSTAT ) 0.4 MG SL tablet Place 1 tablet (0.4 mg total) under the tongue every 5 (five) minutes as needed for chest pain. 25 tablet 11   olopatadine (PATADAY) 0.1 % ophthalmic solution Place 1 drop into both eyes daily. 24 hr     pantoprazole  (PROTONIX ) 40 MG tablet Take 1 tablet (40 mg total) by mouth daily at 12 noon. (Patient taking differently: Take 40 mg by mouth 2 (two) times daily.) 30 tablet 1   Probiotic Product (ALIGN) 4 MG CAPS Take 4 mg by mouth daily.     ranolazine  (RANEXA ) 500 MG 12 hr tablet TAKE ONE TABLET BY MOUTH TWICE DAILY 180 tablet 3   rosuvastatin  (CRESTOR ) 40 MG tablet Take 40 mg by mouth every evening.     triamcinolone cream (KENALOG) 0.1 % Apply 1 Application topically daily. On both legs.With vitamin E oil     No current facility-administered medications for this visit.    REVIEW OF SYSTEMS:   [X]  denotes positive finding, [ ]  denotes negative finding Cardiac  Comments:  Chest pain or chest pressure:    Shortness of breath upon exertion:    Short of breath when lying flat:    Irregular heart rhythm:        Vascular    Pain in calf, thigh, or hip brought on by ambulation:    Pain in feet at night that wakes you up from your sleep:     Blood clot in your veins:    Leg swelling:         Pulmonary    Oxygen  at home:    Productive cough:  Wheezing:         Neurologic    Sudden weakness in arms or legs:     Sudden numbness in arms or legs:     Sudden onset of difficulty speaking or slurred speech:    Temporary loss of vision in one eye:     Problems with dizziness:         Gastrointestinal    Blood in stool:     Vomited blood:         Genitourinary    Burning when urinating:     Blood in urine:        Psychiatric    Major depression:         Hematologic    Bleeding problems:    Problems with blood clotting too easily:        Skin    Rashes or ulcers:        Constitutional    Fever or chills:      PHYSICAL EXAM:   Vitals:   03/21/24 0947  BP: (!) 102/58  Pulse: (!) 59  Resp: (!) 22  Temp: 98 F (36.7 C)  TempSrc: Temporal  SpO2: 98%  Weight: 188 lb (85.3 kg)  Height: 5' 3 (1.6 m)    GENERAL: The patient is a well-nourished female, in no acute distress. The vital signs are documented above. CARDIAC: There is a regular rate and rhythm.  PULMONARY: Non-labored respirations ABDOMEN: Soft and non-tender .  MUSCULOSKELETAL: There are no major deformities or cyanosis. NEUROLOGIC: No focal weakness or paresthesias are detected. SKIN: There are no ulcers or rashes noted. PSYCHIATRIC: The patient has a normal affect.  STUDIES:   I have reviewed the following duplex: Mesenteric:  Normal Celiac artery , Superior Mesenteric artery, Inferior Mesenteric  artery,  Splenic artery and Hepatic artery findings.  The SMA appears widely patent, s/p stenting of the ostia.  MEDICAL ISSUES:   Mesenteric stenosis: The patient has successfully undergone SMA stenting.  Postprocedure ultrasound imaging shows her stent to be widely patent.  She did have GI bleeding while on triple therapy and is now on Eliquis  and aspirin .  Unfortunately, she has not had complete resolution of her abdominal pain.  I discussed that I suspect that the majority of her pain now is a motility issue or some other underlying GI issue and  have recommended that she reach out to her gastroenterologist for follow-up.  I will plan on having her return in 6 months for surveillance ultrasound imaging    [name], IV, MD, FACS [facility] [phone] Pager [phone]     [1]  Allergies Allergen Reactions   Other Anaphylaxis and Cough    fragarances-perfumes   Dilaudid [Hydromorphone Hcl] Nausea Only   Erythromycin Nausea And Vomiting   Diflucan [Fluconazole] Palpitations and Hypertension    When taken with Nicardipine , reaction happens.   "

## 2024-03-22 ENCOUNTER — Encounter: Payer: Self-pay | Admitting: Pulmonary Disease

## 2024-03-22 ENCOUNTER — Ambulatory Visit: Admitting: Pulmonary Disease

## 2024-03-22 VITALS — BP 130/69 | HR 60 | Wt 183.0 lb

## 2024-03-22 DIAGNOSIS — J9611 Chronic respiratory failure with hypoxia: Secondary | ICD-10-CM

## 2024-03-22 DIAGNOSIS — J4530 Mild persistent asthma, uncomplicated: Secondary | ICD-10-CM

## 2024-03-22 DIAGNOSIS — Z87891 Personal history of nicotine dependence: Secondary | ICD-10-CM

## 2024-03-22 MED ORDER — BUDESONIDE-FORMOTEROL FUMARATE 160-4.5 MCG/ACT IN AERO: 2.0000 | INHALATION_SPRAY | Freq: Two times a day (BID) | RESPIRATORY_TRACT | 12 refills | Status: AC

## 2024-03-22 NOTE — Progress Notes (Signed)
 "  Established Patient Pulmonology Office Visit   Subjective:  Patient ID: Desiree Leon, female    DOB: Jun 08, 1940  MRN: [id number]  CC:  Chief Complaint  Patient presents with   Medical Management of Chronic Issues    Pt states Ct results    Discussed the use of AI scribe software for clinical note transcription with the patient, who gave verbal consent to proceed.  History of Present Illness Desiree Leon is an 84 year old woman, former smoker with history of coronary artery disease, pulmonary emboli, hypothyroidism and mitral valve prolapse who returns to pulmonary clinic for chronic respiratory failure.   During her recent hospitalization her breathing was described as terrible, but has improved at home. She had internal bleeding requiring a seven-day hospitalization starting on New Year's Eve, during which she received two units of blood. Colonoscopy showed bleeding capillaries that were cauterized and treated with spray.  She currently uses Symbicort  two puffs twice daily and albuterol  at night for wheezing. She has not used Spiriva or Incruse.  She has chronic leg swelling that is stable on daily furosemide  20 mg, though she had marked edema during her hospitalization.  A CT scan of the chest was performed during this period.          ROS   Current Medications[1]      Objective:  BP 130/69   Pulse 60   Wt 183 lb (83 kg)   SpO2 97%   PF (!) 3 L/min Comment: via POC  BMI 32.42 kg/m     Physical Exam Constitutional:      General: She is not in acute distress.    Appearance: Normal appearance.  Eyes:     General: No scleral icterus.    Conjunctiva/sclera: Conjunctivae normal.  Cardiovascular:     Rate and Rhythm: Normal rate and regular rhythm.  Pulmonary:     Breath sounds: No wheezing, rhonchi or rales.  Musculoskeletal:     Right lower leg: Edema present.     Left lower leg: Edema present.  Skin:    General: Skin is warm and dry.  Neurological:      General: No focal deficit present.      Diagnostic Review:  Last CBC Lab Results  Component Value Date   WBC 6.0 03/15/2024   HGB 9.4 (L) 03/15/2024   HCT 29.2 (L) 03/15/2024   MCV 93.9 03/15/2024   MCH 30.2 03/15/2024   RDW 15.0 03/15/2024   PLT 213 03/15/2024   Last metabolic panel Lab Results  Component Value Date   GLUCOSE 151 (H) 03/14/2024   NA 139 03/14/2024   K 3.4 (L) 03/14/2024   CL 104 03/14/2024   CO2 26 03/14/2024   BUN <5 (L) 03/14/2024   CREATININE 0.66 03/14/2024   GFRNONAA >60 03/14/2024   CALCIUM  8.5 (L) 03/14/2024   PROT 5.3 (L) 03/10/2024   ALBUMIN 3.3 (L) 03/10/2024   BILITOT 0.8 03/10/2024   ALKPHOS 73 03/10/2024   AST 20 03/10/2024   ALT 8 03/10/2024   ANIONGAP 9 03/14/2024       Assessment & Plan:   Assessment & Plan Mild persistent asthma without complication  Orders:   budesonide -formoterol  (SYMBICORT ) 160-4.5 MCG/ACT inhaler; Inhale 2 puffs into the lungs 2 (two) times daily.  Chronic hypoxemic respiratory failure (HCC)      Assessment and Plan Assessment & Plan Mild persistent asthma Recent exacerbation due to internal bleeding. Wheezing primarily at night. Current regimen includes Symbicort  and albuterol . CT  scan shows no significant changes. - Continue Symbicort  inhaler, two puffs twice daily, including at night. - Use albuterol  as needed for wheezing, especially at night. - Ensure mouth rinsing after inhaler use. - Refilled Symbicort  and albuterol .  Chronic Hypoxemic Respiratory Failure - continue supplemental oxygen     Return in about 6 months (around 09/19/2024) for f/u visit Dr. Kara.   Dorn KATHEE Kara, MD     [1]  Current Outpatient Medications:    acetaminophen  (TYLENOL ) 500 MG tablet, Take 500 mg by mouth daily as needed for fever, headache or mild pain (pain score 1-3)., Disp: , Rfl:    albuterol  (VENTOLIN  HFA) 108 (90 Base) MCG/ACT inhaler, Inhale 1-2 puffs into the lungs every 6 (six) hours as  needed for wheezing or shortness of breath., Disp: 8 g, Rfl: 11   ALPRAZolam  (XANAX ) 0.25 MG tablet, Take 0.25 mg by mouth daily as needed for anxiety., Disp: , Rfl:    aspirin  EC 81 MG tablet, Take 1 tablet (81 mg total) by mouth daily. Swallow whole., Disp: 150 tablet, Rfl: 2   cholecalciferol  (VITAMIN D3) 25 MCG (1000 UNIT) tablet, Take 2,000 Units by mouth daily., Disp: , Rfl:    docusate sodium  (COLACE) 100 MG capsule, Take 100 mg by mouth in the morning., Disp: , Rfl:    ELIQUIS  2.5 MG TABS tablet, Take 1 tablet (2.5 mg total) by mouth 2 (two) times daily. Resume on 4/8 evening, Disp: 180 tablet, Rfl: 3   escitalopram  (LEXAPRO ) 5 MG tablet, Take 5 mg by mouth every evening. , Disp: , Rfl:    ezetimibe  (ZETIA ) 10 MG tablet, Take 10 mg by mouth daily., Disp: , Rfl:    furosemide  (LASIX ) 20 MG tablet, Take 20 mg by mouth daily as needed for fluid or edema., Disp: , Rfl:    levothyroxine  (SYNTHROID ) 137 MCG tablet, Take 137 mcg by mouth every morning., Disp: , Rfl:    metoprolol  tartrate (LOPRESSOR ) 25 MG tablet, Take 0.5 tablets (12.5 mg total) by mouth 2 (two) times daily., Disp: 90 tablet, Rfl: 3   multivitamin-iron -minerals-folic acid (CENTRUM) chewable tablet, Chew 1 tablet by mouth daily., Disp: , Rfl:    niCARdipine  (CARDENE ) 20 MG capsule, Take 20 mg by mouth at bedtime., Disp: , Rfl:    nitroGLYCERIN  (NITRODUR - DOSED IN MG/24 HR) 0.4 mg/hr patch, Place 0.4 mg onto the skin daily., Disp: , Rfl:    nitroGLYCERIN  (NITROSTAT ) 0.4 MG SL tablet, Place 1 tablet (0.4 mg total) under the tongue every 5 (five) minutes as needed for chest pain., Disp: 25 tablet, Rfl: 11   olopatadine (PATADAY) 0.1 % ophthalmic solution, Place 1 drop into both eyes daily. 24 hr, Disp: , Rfl:    pantoprazole  (PROTONIX ) 40 MG tablet, Take 1 tablet (40 mg total) by mouth daily at 12 noon. (Patient taking differently: Take 40 mg by mouth 2 (two) times daily.), Disp: 30 tablet, Rfl: 1   Probiotic Product (ALIGN) 4 MG  CAPS, Take 4 mg by mouth daily., Disp: , Rfl:    ranolazine  (RANEXA ) 500 MG 12 hr tablet, TAKE ONE TABLET BY MOUTH TWICE DAILY, Disp: 180 tablet, Rfl: 3   rosuvastatin  (CRESTOR ) 40 MG tablet, Take 40 mg by mouth every evening., Disp: , Rfl:    triamcinolone cream (KENALOG) 0.1 %, Apply 1 Application topically daily. On both legs.With vitamin E oil, Disp: , Rfl:    budesonide -formoterol  (SYMBICORT ) 160-4.5 MCG/ACT inhaler, Inhale 2 puffs into the lungs 2 (two) times daily., Disp: 1 each,  Rfl: 12  "

## 2024-03-22 NOTE — Patient Instructions (Addendum)
 Continue symbicort  inhaler 2 puffs twice daily - rinse mouth out after each use  Use albuterol  inhaler 1-2 puffs every 4-6 hours as needed  Follow up in 6 months

## 2024-03-23 ENCOUNTER — Other Ambulatory Visit: Payer: Self-pay

## 2024-03-23 DIAGNOSIS — K551 Chronic vascular disorders of intestine: Secondary | ICD-10-CM

## 2024-03-29 ENCOUNTER — Ambulatory Visit: Payer: Self-pay | Admitting: Pulmonary Disease

## 2024-04-05 ENCOUNTER — Other Ambulatory Visit (HOSPITAL_COMMUNITY): Payer: Self-pay | Admitting: Gastroenterology

## 2024-04-05 DIAGNOSIS — D509 Iron deficiency anemia, unspecified: Secondary | ICD-10-CM | POA: Insufficient documentation

## 2024-04-05 NOTE — Progress Notes (Signed)
 Received Feraheme  510mg  IV x 2 referral.  Sherry Pennant, PharmD, MPH, BCPS, CPP Clinical Pharmacist

## 2024-04-06 ENCOUNTER — Encounter (HOSPITAL_COMMUNITY): Payer: Self-pay | Admitting: Gastroenterology

## 2024-04-06 ENCOUNTER — Telehealth (HOSPITAL_COMMUNITY): Payer: Self-pay | Admitting: Pharmacy Technician

## 2024-04-06 NOTE — Addendum Note (Signed)
 Addended by: DAYNE SHERRY RAMAN on: 04/06/2024 11:07 AM   Modules accepted: Orders

## 2024-04-06 NOTE — Addendum Note (Signed)
 Addended by: DAYNE SHERRY RAMAN on: 04/06/2024 01:25 PM   Modules accepted: Orders

## 2024-04-06 NOTE — Telephone Encounter (Signed)
 Auth Submission: PENDING Site of care: [facility] Payer: [employer] MEDICARE Medication & CPT/J Code(s) submitted: Feraheme  (ferumoxytol ) [id number] Diagnosis Code: D50.9, D64.9 Route of submission (phone, fax, portal): CMM Phone # Fax # Auth type: Buy/Bill HB Units/visits requested: 510mg  x 2 doses Reference number: *** Approval from: *** to ***    Dagoberto Armour, CPhT [facility] Phone: [phone] 04/06/2024

## 2024-04-06 NOTE — Progress Notes (Signed)
 Received order for Venofer 500mg  IV x 2. Unfortunately we do not administer Venofer 500mg .  Reached out to Irene with Dr. Saintclair to request order be changed to Venofer 300mg  x 3 doses or Venofer 200mg  x 5 doses. Dorothe states she will talk to Dr. Saintclair and fax this back.  Sherry Pennant, PharmD, MPH, BCPS, CPP Clinical Pharmacist

## 2024-04-06 NOTE — Progress Notes (Signed)
 Received fax from Dr. Saintclair    Will re-order Feraheme  510mg  IV x 2 doses. If denied, will forward to MD  Sherry Pennant, PharmD, MPH, BCPS, CPP Clinical Pharmacist

## 2024-04-20 ENCOUNTER — Encounter (HOSPITAL_COMMUNITY)

## 2024-04-27 ENCOUNTER — Encounter (HOSPITAL_COMMUNITY)

## 2024-07-19 ENCOUNTER — Ambulatory Visit: Admitting: Family Medicine

## 2024-09-19 ENCOUNTER — Ambulatory Visit

## 2024-09-19 ENCOUNTER — Ambulatory Visit (HOSPITAL_COMMUNITY)
